# Patient Record
Sex: Female | Born: 1950 | Race: White | Hispanic: No | Marital: Married | Smoking: Never smoker
Health system: Southern US, Community
[De-identification: ages and names within clinical notes are randomized; demographics above are authoritative.]

## PROBLEM LIST (undated history)

## (undated) DIAGNOSIS — G894 Chronic pain syndrome: Secondary | ICD-10-CM

## (undated) DIAGNOSIS — M47812 Spondylosis without myelopathy or radiculopathy, cervical region: Secondary | ICD-10-CM

## (undated) DIAGNOSIS — M542 Cervicalgia: Secondary | ICD-10-CM

## (undated) DIAGNOSIS — K219 Gastro-esophageal reflux disease without esophagitis: Secondary | ICD-10-CM

## (undated) DIAGNOSIS — I10 Essential (primary) hypertension: Secondary | ICD-10-CM

## (undated) DIAGNOSIS — M961 Postlaminectomy syndrome, not elsewhere classified: Secondary | ICD-10-CM

## (undated) DIAGNOSIS — Z8489 Family history of other specified conditions: Secondary | ICD-10-CM

## (undated) DIAGNOSIS — M8580 Other specified disorders of bone density and structure, unspecified site: Secondary | ICD-10-CM

## (undated) DIAGNOSIS — R519 Headache, unspecified: Secondary | ICD-10-CM

## (undated) DIAGNOSIS — M1710 Unilateral primary osteoarthritis, unspecified knee: Secondary | ICD-10-CM

## (undated) DIAGNOSIS — E785 Hyperlipidemia, unspecified: Secondary | ICD-10-CM

## (undated) DIAGNOSIS — R198 Other specified symptoms and signs involving the digestive system and abdomen: Secondary | ICD-10-CM

## (undated) HISTORY — DX: Spondylosis without myelopathy or radiculopathy, cervical region: M47.812

## (undated) HISTORY — DX: Hyperlipidemia, unspecified: E78.5

## (undated) HISTORY — DX: Unilateral primary osteoarthritis, unspecified knee: M17.10

## (undated) HISTORY — DX: Chronic pain syndrome: G89.4

## (undated) HISTORY — DX: Cervicalgia: M54.2

## (undated) HISTORY — DX: Gastro-esophageal reflux disease without esophagitis: K21.9

## (undated) HISTORY — DX: Postlaminectomy syndrome, not elsewhere classified: M96.1

## (undated) HISTORY — PX: KNEE ARTHROSCOPY: SUR90

## (undated) HISTORY — DX: Essential (primary) hypertension: I10

## (undated) HISTORY — DX: Other specified disorders of bone density and structure, unspecified site: M85.80

## (undated) HISTORY — PX: COLOSTOMY REVERSAL: SHX5782

---

## 2003-02-25 ENCOUNTER — Ambulatory Visit (HOSPITAL_COMMUNITY): Admission: RE | Admit: 2003-02-25 | Discharge: 2003-02-25 | Payer: Self-pay | Admitting: Orthopedic Surgery

## 2004-08-22 ENCOUNTER — Encounter: Admission: RE | Admit: 2004-08-22 | Discharge: 2004-08-22 | Payer: Self-pay | Admitting: Internal Medicine

## 2004-12-01 ENCOUNTER — Encounter: Admission: RE | Admit: 2004-12-01 | Discharge: 2004-12-15 | Payer: Self-pay | Admitting: Neurosurgery

## 2004-12-25 HISTORY — PX: OTHER SURGICAL HISTORY: SHX169

## 2005-02-23 ENCOUNTER — Encounter: Admission: RE | Admit: 2005-02-23 | Discharge: 2005-05-24 | Payer: Self-pay | Admitting: Internal Medicine

## 2005-05-15 ENCOUNTER — Ambulatory Visit: Payer: Self-pay | Admitting: Internal Medicine

## 2005-07-04 ENCOUNTER — Ambulatory Visit (HOSPITAL_COMMUNITY): Admission: RE | Admit: 2005-07-04 | Discharge: 2005-07-04 | Payer: Self-pay | Admitting: Neurosurgery

## 2005-12-20 ENCOUNTER — Inpatient Hospital Stay (HOSPITAL_COMMUNITY): Admission: RE | Admit: 2005-12-20 | Discharge: 2005-12-21 | Payer: Self-pay | Admitting: Neurosurgery

## 2006-05-30 ENCOUNTER — Encounter: Admission: RE | Admit: 2006-05-30 | Discharge: 2006-06-29 | Payer: Self-pay | Admitting: Neurosurgery

## 2006-08-23 ENCOUNTER — Ambulatory Visit: Payer: Self-pay | Admitting: Internal Medicine

## 2007-05-06 ENCOUNTER — Encounter: Admission: RE | Admit: 2007-05-06 | Discharge: 2007-05-06 | Payer: Self-pay | Admitting: Neurosurgery

## 2007-05-23 ENCOUNTER — Encounter: Payer: Self-pay | Admitting: Internal Medicine

## 2007-07-29 ENCOUNTER — Encounter: Payer: Self-pay | Admitting: Internal Medicine

## 2007-08-06 ENCOUNTER — Encounter (INDEPENDENT_AMBULATORY_CARE_PROVIDER_SITE_OTHER): Payer: Self-pay | Admitting: *Deleted

## 2007-08-12 ENCOUNTER — Encounter
Admission: RE | Admit: 2007-08-12 | Discharge: 2007-09-10 | Payer: Self-pay | Admitting: Physical Medicine & Rehabilitation

## 2007-08-12 ENCOUNTER — Ambulatory Visit: Payer: Self-pay | Admitting: Physical Medicine & Rehabilitation

## 2007-09-02 ENCOUNTER — Ambulatory Visit: Payer: Self-pay | Admitting: Internal Medicine

## 2007-09-02 LAB — CONVERTED CEMR LAB
Cholesterol, target level: 200 mg/dL
LDL Goal: 160 mg/dL

## 2007-09-03 ENCOUNTER — Telehealth: Payer: Self-pay | Admitting: Internal Medicine

## 2007-09-10 ENCOUNTER — Encounter: Admission: RE | Admit: 2007-09-10 | Discharge: 2007-12-09 | Payer: Self-pay | Admitting: Anesthesiology

## 2007-10-08 ENCOUNTER — Ambulatory Visit: Payer: Self-pay | Admitting: Anesthesiology

## 2007-10-15 ENCOUNTER — Encounter: Payer: Self-pay | Admitting: Internal Medicine

## 2007-12-09 ENCOUNTER — Encounter: Admission: RE | Admit: 2007-12-09 | Discharge: 2007-12-10 | Payer: Self-pay | Admitting: Anesthesiology

## 2007-12-10 ENCOUNTER — Ambulatory Visit: Payer: Self-pay | Admitting: Anesthesiology

## 2008-01-07 ENCOUNTER — Encounter
Admission: RE | Admit: 2008-01-07 | Discharge: 2008-04-06 | Payer: Self-pay | Admitting: Physical Medicine & Rehabilitation

## 2008-01-07 ENCOUNTER — Ambulatory Visit: Payer: Self-pay | Admitting: Physical Medicine & Rehabilitation

## 2008-02-17 ENCOUNTER — Ambulatory Visit: Payer: Self-pay | Admitting: Physical Medicine & Rehabilitation

## 2008-03-13 ENCOUNTER — Encounter
Admission: RE | Admit: 2008-03-13 | Discharge: 2008-06-11 | Payer: Self-pay | Admitting: Physical Medicine & Rehabilitation

## 2008-05-04 ENCOUNTER — Encounter
Admission: RE | Admit: 2008-05-04 | Discharge: 2008-05-25 | Payer: Self-pay | Admitting: Physical Medicine & Rehabilitation

## 2008-05-04 ENCOUNTER — Ambulatory Visit: Payer: Self-pay | Admitting: Physical Medicine & Rehabilitation

## 2008-05-19 ENCOUNTER — Encounter: Payer: Self-pay | Admitting: Internal Medicine

## 2008-05-25 ENCOUNTER — Encounter
Admission: RE | Admit: 2008-05-25 | Discharge: 2008-08-23 | Payer: Self-pay | Admitting: Physical Medicine & Rehabilitation

## 2008-05-25 ENCOUNTER — Ambulatory Visit: Payer: Self-pay | Admitting: Physical Medicine & Rehabilitation

## 2008-07-07 ENCOUNTER — Ambulatory Visit: Payer: Self-pay | Admitting: Physical Medicine & Rehabilitation

## 2008-08-04 ENCOUNTER — Ambulatory Visit: Payer: Self-pay | Admitting: Physical Medicine & Rehabilitation

## 2008-09-04 ENCOUNTER — Encounter
Admission: RE | Admit: 2008-09-04 | Discharge: 2008-09-07 | Payer: Self-pay | Admitting: Physical Medicine & Rehabilitation

## 2008-09-07 ENCOUNTER — Ambulatory Visit: Payer: Self-pay | Admitting: Physical Medicine & Rehabilitation

## 2008-09-22 ENCOUNTER — Encounter: Payer: Self-pay | Admitting: Internal Medicine

## 2008-10-28 ENCOUNTER — Encounter
Admission: RE | Admit: 2008-10-28 | Discharge: 2009-01-26 | Payer: Self-pay | Admitting: Physical Medicine & Rehabilitation

## 2008-11-03 ENCOUNTER — Ambulatory Visit: Payer: Self-pay | Admitting: Physical Medicine & Rehabilitation

## 2008-11-17 ENCOUNTER — Encounter: Payer: Self-pay | Admitting: Internal Medicine

## 2008-12-29 ENCOUNTER — Ambulatory Visit: Payer: Self-pay | Admitting: Physical Medicine & Rehabilitation

## 2009-02-19 ENCOUNTER — Encounter
Admission: RE | Admit: 2009-02-19 | Discharge: 2009-05-20 | Payer: Self-pay | Admitting: Physical Medicine & Rehabilitation

## 2009-03-01 ENCOUNTER — Telehealth: Payer: Self-pay | Admitting: Internal Medicine

## 2009-03-01 ENCOUNTER — Ambulatory Visit: Payer: Self-pay | Admitting: Physical Medicine & Rehabilitation

## 2009-03-30 ENCOUNTER — Ambulatory Visit: Payer: Self-pay | Admitting: Physical Medicine & Rehabilitation

## 2009-04-19 ENCOUNTER — Ambulatory Visit: Payer: Self-pay | Admitting: Internal Medicine

## 2009-04-19 DIAGNOSIS — I10 Essential (primary) hypertension: Secondary | ICD-10-CM

## 2009-04-19 DIAGNOSIS — M5030 Other cervical disc degeneration, unspecified cervical region: Secondary | ICD-10-CM

## 2009-04-19 DIAGNOSIS — E785 Hyperlipidemia, unspecified: Secondary | ICD-10-CM | POA: Insufficient documentation

## 2009-04-21 LAB — CONVERTED CEMR LAB
ALT: 58 units/L — ABNORMAL HIGH (ref 0–35)
Alkaline Phosphatase: 53 units/L (ref 39–117)
BUN: 12 mg/dL (ref 6–23)
Bilirubin, Direct: 0 mg/dL (ref 0.0–0.3)
Calcium: 9.2 mg/dL (ref 8.4–10.5)
Creatinine, Ser: 0.6 mg/dL (ref 0.4–1.2)
Eosinophils Relative: 1.8 % (ref 0.0–5.0)
GFR calc non Af Amer: 108.78 mL/min (ref >60)
Lymphocytes Relative: 31.8 % (ref 12.0–46.0)
MCV: 94.2 fL (ref 78.0–100.0)
Monocytes Absolute: 0.3 10*3/uL (ref 0.1–1.0)
Monocytes Relative: 6.2 % (ref 3.0–12.0)
Neutrophils Relative %: 60.2 % (ref 43.0–77.0)
Platelets: 226 10*3/uL (ref 150.0–400.0)
Total Bilirubin: 0.8 mg/dL (ref 0.3–1.2)
WBC: 5.5 10*3/uL (ref 4.5–10.5)

## 2009-04-22 ENCOUNTER — Telehealth (INDEPENDENT_AMBULATORY_CARE_PROVIDER_SITE_OTHER): Payer: Self-pay | Admitting: *Deleted

## 2009-04-22 ENCOUNTER — Encounter (INDEPENDENT_AMBULATORY_CARE_PROVIDER_SITE_OTHER): Payer: Self-pay | Admitting: *Deleted

## 2009-04-23 ENCOUNTER — Telehealth: Payer: Self-pay | Admitting: Internal Medicine

## 2009-05-04 ENCOUNTER — Ambulatory Visit: Payer: Self-pay | Admitting: Physical Medicine & Rehabilitation

## 2009-05-10 ENCOUNTER — Encounter (INDEPENDENT_AMBULATORY_CARE_PROVIDER_SITE_OTHER): Payer: Self-pay | Admitting: *Deleted

## 2009-05-28 ENCOUNTER — Encounter
Admission: RE | Admit: 2009-05-28 | Discharge: 2009-07-27 | Payer: Self-pay | Admitting: Physical Medicine & Rehabilitation

## 2009-06-01 ENCOUNTER — Encounter: Payer: Self-pay | Admitting: Internal Medicine

## 2009-06-01 ENCOUNTER — Ambulatory Visit: Payer: Self-pay | Admitting: Physical Medicine & Rehabilitation

## 2009-07-27 ENCOUNTER — Ambulatory Visit: Payer: Self-pay | Admitting: Physical Medicine & Rehabilitation

## 2009-08-24 ENCOUNTER — Encounter
Admission: RE | Admit: 2009-08-24 | Discharge: 2009-11-22 | Payer: Self-pay | Admitting: Physical Medicine & Rehabilitation

## 2009-08-25 ENCOUNTER — Ambulatory Visit: Payer: Self-pay | Admitting: Physical Medicine & Rehabilitation

## 2009-09-27 ENCOUNTER — Ambulatory Visit: Payer: Self-pay | Admitting: Physical Medicine & Rehabilitation

## 2009-10-12 ENCOUNTER — Encounter: Payer: Self-pay | Admitting: Internal Medicine

## 2009-10-26 ENCOUNTER — Ambulatory Visit: Payer: Self-pay | Admitting: Physical Medicine & Rehabilitation

## 2009-11-16 ENCOUNTER — Encounter: Payer: Self-pay | Admitting: Internal Medicine

## 2009-11-22 ENCOUNTER — Encounter
Admission: RE | Admit: 2009-11-22 | Discharge: 2009-12-22 | Payer: Self-pay | Admitting: Physical Medicine & Rehabilitation

## 2009-11-23 ENCOUNTER — Ambulatory Visit: Payer: Self-pay | Admitting: Physical Medicine & Rehabilitation

## 2009-12-21 ENCOUNTER — Ambulatory Visit: Payer: Self-pay | Admitting: Physical Medicine & Rehabilitation

## 2009-12-25 HISTORY — PX: COLONOSCOPY: SHX174

## 2010-01-14 ENCOUNTER — Encounter
Admission: RE | Admit: 2010-01-14 | Discharge: 2010-04-11 | Payer: Self-pay | Admitting: Physical Medicine & Rehabilitation

## 2010-01-18 ENCOUNTER — Ambulatory Visit: Payer: Self-pay | Admitting: Physical Medicine & Rehabilitation

## 2010-03-15 ENCOUNTER — Ambulatory Visit: Payer: Self-pay | Admitting: Physical Medicine & Rehabilitation

## 2010-04-11 ENCOUNTER — Encounter
Admission: RE | Admit: 2010-04-11 | Discharge: 2010-07-10 | Payer: Self-pay | Admitting: Physical Medicine & Rehabilitation

## 2010-04-14 ENCOUNTER — Ambulatory Visit: Payer: Self-pay | Admitting: Physical Medicine & Rehabilitation

## 2010-04-19 ENCOUNTER — Ambulatory Visit: Payer: Self-pay | Admitting: Internal Medicine

## 2010-04-19 DIAGNOSIS — M542 Cervicalgia: Secondary | ICD-10-CM | POA: Insufficient documentation

## 2010-04-20 ENCOUNTER — Encounter (INDEPENDENT_AMBULATORY_CARE_PROVIDER_SITE_OTHER): Payer: Self-pay | Admitting: *Deleted

## 2010-05-19 ENCOUNTER — Encounter (INDEPENDENT_AMBULATORY_CARE_PROVIDER_SITE_OTHER): Payer: Self-pay | Admitting: *Deleted

## 2010-05-24 ENCOUNTER — Ambulatory Visit: Payer: Self-pay | Admitting: Gastroenterology

## 2010-05-24 ENCOUNTER — Ambulatory Visit: Payer: Self-pay | Admitting: Physical Medicine & Rehabilitation

## 2010-05-31 ENCOUNTER — Ambulatory Visit: Payer: Self-pay | Admitting: Gastroenterology

## 2010-06-14 ENCOUNTER — Encounter
Admission: RE | Admit: 2010-06-14 | Discharge: 2010-07-18 | Payer: Self-pay | Admitting: Physical Medicine & Rehabilitation

## 2010-06-23 ENCOUNTER — Ambulatory Visit: Payer: Self-pay | Admitting: Physical Medicine & Rehabilitation

## 2010-07-21 ENCOUNTER — Encounter
Admission: RE | Admit: 2010-07-21 | Discharge: 2010-10-19 | Payer: Self-pay | Admitting: Physical Medicine & Rehabilitation

## 2010-07-28 ENCOUNTER — Ambulatory Visit: Payer: Self-pay | Admitting: Physical Medicine & Rehabilitation

## 2010-09-20 ENCOUNTER — Ambulatory Visit: Payer: Self-pay | Admitting: Physical Medicine & Rehabilitation

## 2010-10-03 ENCOUNTER — Encounter: Payer: Self-pay | Admitting: Internal Medicine

## 2010-10-11 ENCOUNTER — Ambulatory Visit: Payer: Self-pay | Admitting: Internal Medicine

## 2010-10-11 DIAGNOSIS — M25569 Pain in unspecified knee: Secondary | ICD-10-CM

## 2010-10-11 DIAGNOSIS — I7300 Raynaud's syndrome without gangrene: Secondary | ICD-10-CM | POA: Insufficient documentation

## 2010-10-11 DIAGNOSIS — R740 Nonspecific elevation of levels of transaminase and lactic acid dehydrogenase [LDH]: Secondary | ICD-10-CM

## 2010-10-11 DIAGNOSIS — R7401 Elevation of levels of liver transaminase levels: Secondary | ICD-10-CM | POA: Insufficient documentation

## 2010-10-11 DIAGNOSIS — M25469 Effusion, unspecified knee: Secondary | ICD-10-CM | POA: Insufficient documentation

## 2010-10-13 ENCOUNTER — Telehealth: Payer: Self-pay | Admitting: Internal Medicine

## 2010-10-17 LAB — CONVERTED CEMR LAB
ALT: 26 units/L (ref 0–35)
AST: 29 units/L (ref 0–37)
Total Bilirubin: 0.4 mg/dL (ref 0.3–1.2)

## 2010-11-07 ENCOUNTER — Encounter
Admission: RE | Admit: 2010-11-07 | Discharge: 2010-11-10 | Payer: Self-pay | Source: Home / Self Care | Attending: Physical Medicine & Rehabilitation | Admitting: Physical Medicine & Rehabilitation

## 2010-11-08 ENCOUNTER — Encounter: Admission: RE | Admit: 2010-11-08 | Discharge: 2010-11-08 | Payer: Self-pay | Admitting: Sports Medicine

## 2010-11-10 ENCOUNTER — Ambulatory Visit: Payer: Self-pay | Admitting: Physical Medicine & Rehabilitation

## 2011-01-05 ENCOUNTER — Encounter
Admission: RE | Admit: 2011-01-05 | Discharge: 2011-01-09 | Payer: Self-pay | Source: Home / Self Care | Attending: Physical Medicine & Rehabilitation | Admitting: Physical Medicine & Rehabilitation

## 2011-01-09 ENCOUNTER — Ambulatory Visit
Admission: RE | Admit: 2011-01-09 | Discharge: 2011-01-09 | Payer: Self-pay | Source: Home / Self Care | Attending: Physical Medicine & Rehabilitation | Admitting: Physical Medicine & Rehabilitation

## 2011-01-24 ENCOUNTER — Telehealth (INDEPENDENT_AMBULATORY_CARE_PROVIDER_SITE_OTHER): Payer: Self-pay | Admitting: *Deleted

## 2011-01-24 NOTE — Assessment & Plan Note (Signed)
Summary: talk about pain mangement of neck//lch   Vital Signs:  Patient profile:   60 year old female Weight:      137.8 pounds BMI:     24.89 Temp:     99.2 degrees F oral Pulse rate:   72 / minute Resp:     15 per minute BP sitting:   128 / 84  (left arm) Cuff size:   regular  Vitals Entered By: Shonna Chock (April 19, 2010 1:40 PM) CC: Discuss Neck pain Comments REVIEWED MED LIST, PATIENT AGREED DOSE AND INSTRUCTION CORRECT    CC:  Discuss Neck pain.  History of Present Illness: She is seeing Dr Andee Lineman  or his nurse monthly for med refills for intractable pain following  cervical fusion in 2006.The frequent visits are a financial hardship.Meds reviewed ; now off Gabapentin & Carisoprolol because of suboptimal response.  The Opana long acting & short acting are stable Rxs. Physical Therapy prescribed but declined as it had previously been ineffective.She queries having me refill pain meds , but [place] [facility] restrictions discussed.  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 LDL goal = < 130, ideally < 100. Hypertension  Past Surgical History: Cervical fusion 2006 @ [facility], Dr Lovell Sheehan; Arthroscopy knee 2004; G 1 P1  Review of Systems General:  Denies fatigue and sleep disorder. ENT:  Denies difficulty swallowing and hoarseness. CV:  Denies palpitations. GI:  Complains of constipation; denies diarrhea. Derm:  Denies changes in nail beds, dryness, and hair loss. Neuro:  Denies brief paralysis, numbness, tingling, and weakness. Psych:  Denies anxiety, depression, easily angered, easily tearful, and irritability.  Physical Exam  General:  well-nourished,in no acute distress; alert,appropriate and cooperative throughout examination Eyes:  No corneal or conjunctival inflammation  noted.Marland Kitchen Perrla.No lid lag Neck:  No deformities, masses, or tenderness noted. R lobe larger than L w/o nodules Heart:  Normal rate and regular rhythm. S1 and S2 normal without gallop, murmur, click, rub .S4 Neurologic:  alert & oriented X3, strength normal in all extremities, and DTRs symmetrical and normal.   Skin:  Intact without suspicious lesions or rashes Psych:  memory intact for recent and remote, normally interactive, good eye contact, not anxious appearing, and not depressed appearing.     Impression & Recommendations:  Problem # 1:  NECK PAIN, CHRONIC (ICD-723.1)  The following medications were removed from the medication list:    Carisoprodol 350 Mg Tabs (Carisoprodol) .Marland Kitchen... 1-2 by mouth once daily Her updated medication list for this problem includes:    Opana Er 30 Mg Xr12h-tab (Oxymorphone hcl) .Marland Kitchen... 1 by mouth every 12 hours    Opana 10 Mg Tabs (Oxymorphone hcl) .Marland Kitchen... 1 by mouth every 6 hours as needed    Amrix 15 Mg Xr24h-cap (Cyclobenzaprine hcl) .Marland Kitchen... 1 by mouth at bedtime  Problem # 2:  HYPERTENSION (ICD-401.9) controlled Her updated medication list for this problem includes:    Metoprolol Tartrate 50 Mg Tabs (Metoprolol tartrate) .Marland Kitchen... Take 1/2- 1 tab once daily to keep bp <  130/85 on average  Problem # 3:  HYPERLIPIDEMIA (ICD-272.4) LDL goal = <130.  Complete Medication List: 1)  Combipatch 0.05-0.25 Mg/day Pttw (Estradiol-norethindrone acet) 2)  Metoprolol Tartrate 50 Mg Tabs (Metoprolol tartrate) .... Take 1/2- 1 tab once daily to keep bp <  130/85 on average 3)  Opana Er 30  Mg Xr12h-tab (Oxymorphone hcl) .Marland Kitchen.. 1 by mouth every 12 hours 4)  Opana 10 Mg Tabs (Oxymorphone hcl) .Marland Kitchen.. 1 by mouth every 6 hours as needed 5)  Amrix 15 Mg Xr24h-cap (Cyclobenzaprine hcl) .Marland Kitchen.. 1 by mouth at bedtime 6)  Lidoderm 5 % Ptch (Lidocaine) .... 2 patches daily 7)  Voltaren 1 % Gel (Diclofenac sodium) .... As directed  Patient Instructions: 1)  Discuss the frequency of office  visits with Dr Hermelinda Medicus; the restrictions should  be less on him as he is a specialist.Schedule fasting labs: 2)  BMP ; 3)  Hepatic Panel ; 4)  Lipid Panel; 5)  TSH . Prescriptions: METOPROLOL TARTRATE 50 MG TABS (METOPROLOL TARTRATE) take 1/2- 1 tab once daily to keep BP <  130/85 on average  #90 x 3   Entered and Authorized by:   Marga Melnick MD   Signed by:   Marga Melnick MD on 04/19/2010   Method used:   Faxed to ...       [facility]* (retail)       [address]       [place], [place]  [address]       Ph: [phone]       Fax: [phone]   RxID:   [id number]

## 2011-01-24 NOTE — Progress Notes (Signed)
Summary: change referral  Phone Note Call from Patient   Summary of Call: Patient left message on triage that MD referred her to ortho surgeron and she has seen someone before. Patient is requesting an appt sooner or to change the referral. Please call her at [phone] Initial call taken by: Lucious Groves CMA,  October 13, 2010 12:09 PM  Follow-up for Phone Call        I LEFT MESSAGE FOR PT TO CALL ME ABOUT ORTHO REFERRAL, & WILL TRY TO GET HER SOONER APPT W/[facility]. Follow-up by: Magdalen [facility],  October 14, 2010 4:59 PM  Additional Follow-up for Phone Call Additional follow up Details #1::        I S/W PT, SHE WILL NOW BE SEEN AT [facility] ON 10-18-2010 & PATIENT IS SATISFIED. Magdalen Spatz [facility]  October 17, 2010 3:12 PM

## 2011-01-24 NOTE — Procedures (Signed)
Summary: Colonoscopy  Patient: Chloe Young Note: All result statuses are Final unless otherwise noted.  Tests: (1) Colonoscopy (COL)   COL Colonoscopy           DONE     [facility]     [address]     [place], [place]  [address]           COLONOSCOPY PROCEDURE REPORT           PATIENT:  Chloe Young, Chloe Young  MR#:  [id number]     BIRTHDATE:  05-21-1951, 59 yrs. old  GENDER:  female     ENDOSCOPIST:  Judie Petit T. Russella Dar, MD, [facility]     Referred by:  Marga Melnick, M.D.     PROCEDURE DATE:  05/31/2010     PROCEDURE:  Colonoscopy 19[address]     ASA CLASS:  Class II     INDICATIONS:  1) Routine Risk Screening     MEDICATIONS:   Fentanyl 50 mcg IV, Versed 7 mg IV     DESCRIPTION OF PROCEDURE:   After the risks benefits and     alternatives of the procedure were thoroughly explained, informed     consent was obtained.  Digital rectal exam was performed and     revealed no abnormalities.   The LB [id number]-[id number] endoscope     was introduced through the anus and advanced to the cecum, which     was identified by both the appendix and ileocecal valve, without     limitations.  The quality of the prep was excellent, using     MoviPrep.  The instrument was then slowly withdrawn as the colon     was fully examined.     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 A normal     appearing cecum, ileocecal valve, and appendiceal orifice were     identified. The ascending, hepatic flexure, transverse, splenic     flexure, descending, sigmoid colon, and rectum appeared     unremarkable. Retroflexed views in the rectum revealed no     abnormalities. The time to cecum =  4.5  minutes. The scope was     then withdrawn (time =  13.67  min) from the patient and the     procedure completed.           COMPLICATIONS:  None           ENDOSCOPIC IMPRESSION:     1) Melanosis throughout the colon     2) Normal colon     RECOMMENDATIONS:     1) Continue current  colorectal screening recommendations for     "routine risk" patients with a repeat colonoscopy in 10 years.           Venita Lick. Russella Dar, MD, [facility]           n.     eSIGNED:   Venita Lick. Ancel Easler at 05/31/2010 11:58 AM           Chloe Young, [id number]  Note: An exclamation mark (!) indicates a result that was not dispersed into the flowsheet. Document Creation Date: 05/31/2010 11:59 AM _______________________________________________________________________  (1) Order result status: Final Collection or observation date-time: 05/31/2010 11:53 Requested date-time:  Receipt date-time:  Reported date-time:  Referring Physician:   Ordering Physician: Claudette Head [phone]) Specimen Source:  Source: Launa Grill Order Number: [id number] Lab site:   Appended Document: Colonoscopy    Clinical Lists Changes  Observations: Added new observation of COLONNXTDUE: 05/2020 (05/31/2010 13:11)

## 2011-01-24 NOTE — Miscellaneous (Signed)
Summary: Orders Update  Clinical Lists Changes  Problems: Added new problem of SCREENING, COLON CANCER (ICD-V76.51) Orders: Added new Referral order of Gastroenterology Referral (GI) - Signed

## 2011-01-24 NOTE — Assessment & Plan Note (Signed)
Summary: pain in left knee//lch   Vital Signs:  Patient profile:   60 year old female Weight:      141.6 pounds BMI:     25.58 Temp:     98.6 degrees F oral Pulse rate:   64 / minute Resp:     15 per minute BP sitting:   114 / 78  (left arm) Cuff size:   large  Vitals Entered By: Shonna Chock CMA (October 11, 2010 10:03 AM) CC: Pain in left knee x 1 month, Lower Extremity Joint pain   CC:  Pain in left knee x 1 month and Lower Extremity Joint pain.  History of Present Illness: Lower Extremity Pain      This is a 60 year old woman who presents with Lower Extremity pain X 1 month.  The patient reports swelling, stiffness for >1 hr, and decreased ROM, but denies redness, giving away, locking, popping, and weakness.  The pain is located in the left knee in the poplteal space. It radiates into the L calf & L buttocks. The pain began gradually and with no injury.  The pain is described as burning, aching, and constant.  No evaluation to date ;S/P arthroscopic repair of torn meniscus  in R knee 2001. 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  The patient denies the following symptoms: fever, rash, photosensitivity, eye symptoms, diarrhea, and dysuria.  Rx: Meloxicam < 1 / day on average (due to PMH of elevated LFTs in 03/2009: no F/U to date)  with minimal benefit from Dr Riley Kill , Pain Specialist.  Current Medications (verified): 1)  Combipatch 0.05-0.25 Mg/day  Pttw (Estradiol-Norethindrone Acet) 2)  Metoprolol Tartrate 50 Mg Tabs (Metoprolol Tartrate) .... Take 1/2- 1 Tab Once Daily To Keep Bp <  130/85 On Average 3)  Opana Er 30 Mg Xr12h-Tab (Oxymorphone Hcl) .Marland Kitchen.. 1 By Mouth Every 12 Hours 4)  Opana 10 Mg Tabs (Oxymorphone Hcl) .Marland Kitchen.. 1 By Mouth Every 6 Hours As Needed 5)  Amrix 15 Mg Xr24h-Cap (Cyclobenzaprine Hcl) .Marland Kitchen.. 1 By Mouth At Bedtime 6)  Lidoderm 5 % Ptch (Lidocaine) .... 2 Patches Daily 7)  Voltaren 1 % Gel (Diclofenac Sodium) .... As Directed 8)  Omeprazole 20  Mg Tbec (Omeprazole) .Marland Kitchen.. 1 By Mouth Once Daily  Allergies (verified): No Known Drug Allergies  Physical Exam  General:  in no acute distress; alert,appropriate and cooperative throughout examination Msk:  No deformity or scoliosis noted of thoracic or lumbar spine.  No tendernes to percussion. Lay down & sat w/o help. Neg SLR bilaterally   Knee Exam  Gait:    Flat footed  gait pattern bilaterally with slight ankle inversion.    Skin:    Intact, no scars, lesions, rashes, cafe au lait spots, or bruising.  Lacy venous pattern over legs  Inspection:    Fusiformly enlarged knees . Varus deformity. Effusion L knee. Decrecreased ROM bilaterally.   Palpation:    Non-tender to palpation over ant knee or in popliteal space. Neg Homan's  Vascular:    There was no swelling or varicose veins. The pedal pulses are decreased ; femoral pulses normal w/o bruits. Feet &  hands are cool w/o ischemia or color change. There was no edema or tenderness.  Motor:    Motor strength 5/5 bilaterally for LE but ? slight atrophy of R thigh muscles  Reflexes:    decreased R-patellar and decreased L-patellar.     Impression & Recommendations:  Problem #  1:  KNEE PAIN, LEFT (ICD-719.46)  R/O Popliteal Cyst  Her updated medication list for this problem includes:    Opana Er 30 Mg Xr12h-tab (Oxymorphone hcl) .Marland Kitchen... 1 by mouth every 12 hours    Opana 10 Mg Tabs (Oxymorphone hcl) .Marland Kitchen... 1 by mouth every 6 hours as needed    Amrix 15 Mg Xr24h-cap (Cyclobenzaprine hcl) .Marland Kitchen... 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 Take 1/2- 1 tab once daily to keep bp <  130/85 on average 3)  Opana Er 30 Mg Xr12h-tab (Oxymorphone hcl) .Marland Kitchen.. 1 by mouth every 12 hours 4)  Opana 10 Mg Tabs (Oxymorphone hcl) .Marland Kitchen.. 1 by mouth every 6 hours as needed 5)  Amrix 15 Mg Xr24h-cap (Cyclobenzaprine hcl) .Marland Kitchen.. 1 by mouth at bedtime 6)  Lidoderm 5 % Ptch (Lidocaine) .... 2 patches daily 7)  Voltaren 1 % Gel (Diclofenac sodium) .... As directed 8)  Omeprazole 20 Mg Tbec (Omeprazole) .Marland Kitchen.. 1 by mouth once daily  Patient Instructions: 1)  Meloxicam 15 mg 1/2 two times a day X 7 days as trial to assess response.   Orders Added: 1)  Est. Patient Level IV [[address]] 2)  Orthopedic Referral [Ortho] 3)  Venipuncture [[address]] 4)  TLB-Hepatic/Liver Function Pnl [80076-HEPATIC]

## 2011-01-24 NOTE — Letter (Signed)
Summary: Moviprep Instructions  [facility]  [address]   [place], [place] [address]   Phone: [phone]  Fax: [phone]       Chloe Young    August 05, 1951    MRN: [id number]        Procedure Day Dorna Bloom: Tuesday, 05-31-10     Arrival Time: 10:00 a.m.     Procedure Time: 11:00 a.m.     Location of Procedure:                    x   [facility] (4th Floor)   PREPARATION FOR COLONOSCOPY WITH MOVIPREP   Starting 5 days prior to your procedure 05-26-10 do not eat nuts, seeds, popcorn, corn, beans, peas,  salads, or any raw vegetables.  Do not take any fiber supplements (e.g. Metamucil, Citrucel, and Benefiber).  THE DAY BEFORE YOUR PROCEDURE         DATE:  05-30-10  DAY: Monday 1.  Drink clear liquids the entire day-NO SOLID FOOD  2.  Do not drink anything colored red or purple.  Avoid juices with pulp.  No orange juice.  3.  Drink at least 64 oz. (8 glasses) of fluid/clear liquids during the day to prevent dehydration and help the prep work efficiently.  CLEAR LIQUIDS INCLUDE: Water Jello Ice Popsicles Tea (sugar ok, no milk/cream) Powdered fruit flavored drinks Coffee (sugar ok, no milk/cream) Gatorade Juice: apple, white grape, white cranberry  Lemonade Clear bullion, consomm, broth Carbonated beverages (any kind) Strained chicken noodle soup Hard Candy                             4.  In the morning, mix first dose of MoviPrep solution:    Empty 1 Pouch A and 1 Pouch B into the disposable container    Add lukewarm drinking water to the top line of the container. Mix to dissolve    Refrigerate (mixed solution should be used within 24 hrs)  5.  Begin drinking the prep at 5:00 p.m. The MoviPrep container is divided by 4 marks.   Every 15 minutes drink the solution down to the next mark (approximately 8 oz) until the full liter is complete.   6.  Follow completed prep with 16 oz of clear liquid of your choice (Nothing red or purple).   Continue to drink clear liquids until bedtime.  7.  Before going to bed, mix second dose of MoviPrep solution:    Empty 1 Pouch A and 1 Pouch B into the disposable container    Add lukewarm drinking water to the top line of the container. Mix to dissolve    Refrigerate  THE DAY OF YOUR PROCEDURE      DATE:  05-31-10  DAY: Tuesday  Beginning at  6:00 a.m. (5 hours before procedure):         1. Every 15 minutes, drink the solution down to the next mark (approx 8 oz) until the full liter is complete.  2. Follow completed prep with 16 oz. of clear liquid of your choice.    3. You may drink clear liquids until 9:00 a.m.  (2 HOURS BEFORE PROCEDURE).   MEDICATION INSTRUCTIONS  Unless otherwise instructed, you should take regular prescription medications with a small sip of water   as early as possible the morning of your procedure.            OTHER INSTRUCTIONS  You  will need a responsible adult at least 61 years of age to accompany you and drive you home.   This person must remain in the waiting room during your procedure.  Wear loose fitting clothing that is easily removed.  Leave jewelry and other valuables at home.  However, you may wish to bring a book to read or  an iPod/MP3 player to listen to music as you wait for your procedure to start.  Remove all body piercing jewelry and leave at home.  Total time from sign-in until discharge is approximately 2-3 hours.  You should go home directly after your procedure and rest.  You can resume normal activities the  day after your procedure.  The day of your procedure you should not:   Drive   Make legal decisions   Operate machinery   Drink alcohol   Return to work  You will receive specific instructions about eating, activities and medications before you leave.    The above instructions have been reviewed and explained to me by  Ezra Sites RN  May 24, 2010 11:19 AM    I fully understand and can verbalize  these instructions _____________________________ Date _________

## 2011-01-24 NOTE — Miscellaneous (Signed)
Summary: Flu/[facility]  Flu/[facility]   Imported By: Lanelle Bal 10/11/2010 13:07:39  _____________________________________________________________________  External Attachment:    Type:   Image     Comment:   External Document

## 2011-01-24 NOTE — Miscellaneous (Signed)
Summary: [facility]  Clinical Lists Changes  Medications: Added new medication of MOVIPREP 100 GM  SOLR (PEG-KCL-NACL-NASULF-NA ASC-C) As per prep instructions. - Signed Rx of MOVIPREP 100 GM  SOLR (PEG-KCL-NACL-NASULF-NA ASC-C) As per prep instructions.;  #1 x 0;  Signed;  Entered by: Ezra Sites RN;  Authorized by: Meryl Dare MD Clementeen Graham;  Method used: Electronically to [facility]*, [address], [place], [place]  [id number], Ph: [phone], Fax: [phone] Observations: Added new observation of NKA: T (05/24/2010 10:53)    Prescriptions: MOVIPREP 100 GM  SOLR (PEG-KCL-NACL-NASULF-NA ASC-C) As per prep instructions.  #1 x 0   Entered by:   Ezra Sites RN   Authorized by:   Meryl Dare MD [facility]   Signed by:   Ezra Sites RN on 05/24/2010   Method used:   Electronically to        [facility]* (retail)       [address]       [place], [place]  [address]       Ph: [phone]       Fax: [phone]   RxID:   [id number]

## 2011-01-24 NOTE — Letter (Signed)
Summary: Primary Care Consult Scheduled Letter  [facility]  [address] [place], [place] [address]   Phone: [phone]  Fax: [phone]      04/20/2010 MRN: [id number]  [facility] [address] [place], [place]  [address]    Dear [name],    We have scheduled an appointment for you.  At the recommendation of Dr. Marga Melnick, we have scheduled you for a Screening Colonoscopy with Dr. Claudette Head of [facility].  Your initial Pre-visit with a Nurse is on 05-05-2010 at 9:00am.  Your Colonoscopy is on 05-19-2010 arrive no later than 1:00pm.  Their address is [address], [place] [address]. The office phone number is [phone].  If this appointment day and time is not convenient for you, please feel free to call the office of the doctor you are being referred to at the number listed above and reschedule the appointment.    It is important for you to keep your scheduled appointments. We are here to make sure you are given good patient care.   Thank you,    [name], Patient Care Coordinator [facility]

## 2011-02-01 NOTE — Progress Notes (Signed)
Summary: Refill Request  Phone Note Refill Request Call back at [phone] Message from:  Pharmacy on January 24, 2011 11:41 AM  Refills Requested: Medication #1:  METOPROLOL TARTRATE 50 MG TABS take 1/2- 1 tab once daily to keep BP <  130/85 on average   Dosage confirmed as above?Dosage Confirmed   Supply Requested: 30   Last Refilled: 01/07/2011 [facility]  Next Appointment Scheduled: none Initial call taken by: Harold Barban,  January 24, 2011 11:41 AM    Prescriptions: METOPROLOL TARTRATE 50 MG TABS (METOPROLOL TARTRATE) take 1/2- 1 tab once daily to keep BP <  130/85 on average  #30 Each x 5   Entered by:   Shonna Chock CMA   Authorized by:   Marga Melnick MD   Signed by:   Shonna Chock CMA on 01/24/2011   Method used:   Electronically to        [facility]* (retail)       [address]       [place], [place]  [address]       Ph: [phone]       Fax: [phone]   RxID:   [id number]

## 2011-03-09 ENCOUNTER — Ambulatory Visit: Payer: Self-pay

## 2011-03-30 ENCOUNTER — Encounter: Payer: 59 | Attending: Physical Medicine & Rehabilitation

## 2011-03-30 DIAGNOSIS — M542 Cervicalgia: Secondary | ICD-10-CM | POA: Insufficient documentation

## 2011-03-30 DIAGNOSIS — M961 Postlaminectomy syndrome, not elsewhere classified: Secondary | ICD-10-CM

## 2011-03-30 DIAGNOSIS — M25519 Pain in unspecified shoulder: Secondary | ICD-10-CM | POA: Insufficient documentation

## 2011-03-30 DIAGNOSIS — M5382 Other specified dorsopathies, cervical region: Secondary | ICD-10-CM

## 2011-03-30 DIAGNOSIS — M531 Cervicobrachial syndrome: Secondary | ICD-10-CM

## 2011-03-30 DIAGNOSIS — R51 Headache: Secondary | ICD-10-CM | POA: Insufficient documentation

## 2011-05-03 LAB — BASIC METABOLIC PANEL
BUN: 14 mg/dL (ref 6–23)
Creatinine, Ser: 0.62 mg/dL (ref 0.4–1.2)
GFR calc non Af Amer: >60 mL/min (ref >60)
Potassium: 4.2 mEq/L (ref 3.5–5.1)

## 2011-05-03 LAB — CBC
MCH: 31.8 pg (ref 26.0–34.0)
MCV: 93.9 fL (ref 78.0–100.0)
Platelets: 222 10*3/uL (ref 150–400)
RDW: 12.6 % (ref 11.5–15.5)
WBC: 5.4 10*3/uL (ref 4.0–10.5)

## 2011-05-08 ENCOUNTER — Ambulatory Visit (HOSPITAL_BASED_OUTPATIENT_CLINIC_OR_DEPARTMENT_OTHER)
Admission: RE | Admit: 2011-05-08 | Discharge: 2011-05-08 | Disposition: A | Discharge status: Home / Self Care | Payer: BC Managed Care – PPO | Source: Ambulatory Visit | Attending: Specialist | Admitting: Specialist

## 2011-05-08 DIAGNOSIS — M1710 Unilateral primary osteoarthritis, unspecified knee: Secondary | ICD-10-CM | POA: Insufficient documentation

## 2011-05-08 DIAGNOSIS — I10 Essential (primary) hypertension: Secondary | ICD-10-CM | POA: Insufficient documentation

## 2011-05-08 DIAGNOSIS — K219 Gastro-esophageal reflux disease without esophagitis: Secondary | ICD-10-CM | POA: Insufficient documentation

## 2011-05-08 DIAGNOSIS — M23305 Other meniscus derangements, unspecified medial meniscus, unspecified knee: Secondary | ICD-10-CM | POA: Insufficient documentation

## 2011-05-08 DIAGNOSIS — M23302 Other meniscus derangements, unspecified lateral meniscus, unspecified knee: Secondary | ICD-10-CM | POA: Insufficient documentation

## 2011-05-09 NOTE — Procedures (Signed)
NAME:  Chloe Young, Chloe Young                 ACCOUNT NO.:  [id number]   MEDICAL RECORD NO.:  [id number]          PATIENT TYPE:  REC   LOCATION:  [facility]                          FACILITY:  [facility]   PHYSICIAN:  Ranelle Oyster, M.D.DATE OF BIRTH:  [birth year]   DATE OF PROCEDURE:  DATE OF DISCHARGE:                               OPERATIVE REPORT   PROCEDURE:  Trigger point injections, diagnostic code 723.9.   DESCRIPTION OF PROCEDURE AND INDICATIONS:  The patient had followup with  Dr. Lovell Sheehan.  He found no new surgical reasons for her cervical pain.  She had had previously good responses with trigger point injections in  the past and wanted to proceed these once again, as her oral medications  were not covering her symptoms.   After informed consent and preparation of the skin with isopropyl  alcohol, we injected five areas today, including the upper and medial  third of the left trapezius, the left sternocleidomastoid and the left  supraspinatus muscle each with 0.5 to 2 mL of 1% lidocaine.  Additionally, we injected the right upper trapezius with the same  solution.  The patient tolerated it well.  We asked her to work on  posture and positioning after the injections.   I will send her for nerve stimulator/muscle stimulator unit through  [facility], as I think she could benefit from this.  We also gave her  Biofreeze samples to try.   I will see her back in about 6 weeks' time, as refills for her  medications were also dispensed today.      Ranelle Oyster, M.D.  Electronically Signed     ZTS/MEDQ  D:  05/25/2008 15:04:16  T:  05/25/2008 16:50:20  Job:  [id number]

## 2011-05-09 NOTE — Assessment & Plan Note (Signed)
HISTORY OF PRESENT ILLNESS:  Chloe Young is back regarding her cervicalgia.  She had good results with the left sternocleidomastoid muscle trigger  point injections in January. She is having more central and superior  pain along the neck today. She rates it a 5 to 8 out of 10. The Opana  immediate release has been effective somewhat, although she is feeling  that it not quite as effective as the Oxycodone 15 mg. She finds that 10  mg of Opana is too sedating for her. We increased her low dose Neurontin  to 300 mg t.i.d. and notes no significant change in neck symptoms. She  has been on Mobic for anti-inflammatory effects in the interim. 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 She has better back and arm symptoms  at this point. Sleep is fair.   REVIEW OF SYSTEMS:  Notable for the above. Full review is in the written  health and history section of the chart.   SOCIAL HISTORY:  As noted above. 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   PHYSICAL EXAMINATION:  VITAL SIGNS:  Blood pressure 173/96, pulse 76,  respiratory rate 18. She is sating 100% on room air.  GENERAL:  The patient is pleasant. Alert and oriented times three.  NEUROLOGIC:  Affect is bright and appropriate. She had some pain over  the lower trapezius, although much improved. She has significant pain,  however, on the upper trapezius at C2-C3, as well as the splenius  capitus muscles in the corresponding areas. The pain was notable with  flexion and more so, extension today. No motor findings were found in  the upper extremities or sensory loss. Sperling's test was negative.  Strength 5/5 in the upper extremities. Reflexes are normal.   ASSESSMENT:  1. History of cervical decompression C4 to C5, and C5 to C6.  2. Cervical facet arthropathy, partially responsive to radiofrequency      ablation.  3. Cervical myofascial pain and postural dysfunction.  4. Headaches.   PLAN:  1. Continue Neurontin 300 mg t.i.d.  2. After informed consent, we injected bilateral splenius capitus and      trapezius muscles with 4 trigger points total, each consisting of 2      cc of 1% Lidocaine. The patient tolerated well.  3. Continue Opana for breakthrough pain for now.  4. Consider home gentle traction.  5. Stretching breast breaks and proper posture at work.  6. Add low-dose Zanaflex for spasm, 2 mg capsule 1 q.8 hours p.r.n.      She was given samples today.  7. I will see her back in about a month.      Ranelle Oyster, M.D.  Electronically Signed    ZTS/MedQ  D:  02/18/2008 13:01:40  T:  02/18/2008 18:09:09  Job #:  [id number]   cc:   Cristi Loron, M.D.  Fax: [phone]

## 2011-05-09 NOTE — Assessment & Plan Note (Signed)
Chloe Young is in back regarding her chronic neck pain.  She has been fairly  stable except for some occasional increases in her breakthrough symptoms  associated when she is up for long periods of time at work.  She uses 2  Opana IR in those cases and this seems to help.  She still only using 1  or 2 a day p.r.n. at her baseline.  She rates her pain 5-6/10.  She has  liked the Amrix at 15 mg p.o. at bedtime for baseline spasm control and  generalized pain.  She describes pain as burning, aching, and constant.  Her sleep is good at this point.  She has to keep up with her bowels and  stay ahead of constipation, but overall is stable there.  Opana ER is 20  mg q.12 h and Opana IR is 10 mg p.r.n.   REVIEW OF SYSTEMS:  Notable for the above.  Full review is in the  written health and history section of the chart.  The patient's Oswestry  score is 20% today.   SOCIAL HISTORY:  As noted above.  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   PHYSICAL EXAMINATION:  VITAL SIGNS:  Blood pressure is 139/93, pulse is  76, respiratory rate is 18.  She is sating 100% on room air.  GENERAL:  The patient is pleasant, alert and oriented x3.  Affect is  generally bright and appropriate.  NEUROLOGIC:  She continues to have some pain with flexion and extension  of the neck and is limited overall.  Strength is 5/5 in all 4  extremities with 2+ reflexes in all extremities.  Posture is fair to  good.  Cognitively, she is alert and appropriate with normal cranial  nerve exam today as well.  HEART:  Regular.  CHEST:  Clear.  ABDOMEN:  Soft, nontender.   ASSESSMENT:  1. Cervical post laminectomy syndrome.  2. Cervical facet arthropathy.  3. Cervical myofascial pain and postural dysfunction.  4. Headache.  5. Right shoulder pain which is improved especially with Amrix.   PLAN:  1. We will make maintain Opana ER 20 mg q.12 h #60.  2. Refilled Opana IR 10 mg 1 q.12 h p.r.n. #75.  She still may use an    additional 1 if she needs to on days pain is worse.  3. Continue Amrix 15 mg at bedtime.  This seems to work well for her.      She may want to try to move up to 30 mg to assess for effect.  4. Continue Neurontin.  5. Continue other modalities and rest breaks.  She can use Mobic      p.r.n. for increased pain as well.  6. I will see her back in 3 months with 18-month nursing followup.      Ranelle Oyster, M.D.  Electronically Signed    ZTS/MedQ  D:  03/01/2009 12:27:56  T:  03/02/2009 01:04:05  Job #:  [id number]   cc:   Cristi Loron, M.D.  Fax: [phone]

## 2011-05-09 NOTE — Assessment & Plan Note (Signed)
HISTORY:  Recurrent history of cervicalgia.  She has fair control with  the Opana, but is needing the Opana usually 3-4 times a day.  She rates  her pain at 4-5 out of 10.  She said her pain was exacerbated by a  recent flight from [place] on Saturday.  She is having shoulder pain  on the right side when she works.  She complains of some swelling in the  legs today, and asked if it is due to the Opana.  She also reports some  stabbing, aching and burning.  Sleep is fairly good.   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  She does have some  occasional constipation and urine retention.  The full review of systems  is written up in the history section.   SOCIAL HISTORY:  As noted above.  The patient is working as a  Interior and spatial designer, and is cutting back her hours a week; but still is at 30+  currently.   PHYSICAL EXAMINATION:  Blood pressure 172/72, pulse 61, respiratory rate  18.  She is satting 98% on room air.  The patient is pleasant, alert and  oriented x3.  Affect is appropriate.  She continues to have some pain  diffusely over the trapezius muscles and upper cervical regions.  She  had a fair range of motion today, but limited somewhat with rotation and  lateral bending to either side.  She had pain in the right shoulder with  rotator cuff impingement maneuvers today, and there was focal shoulder  weakness appreciated.  Spurling's test was negative.  Strength was 5 out  of 5 in both upper extremities, with normal sensory examination and  reflexes today.  Posture was fair. She was alert, appropriate and  cognitively intact.  HEART:  Regular.  CHEST:  Clear.  ABDOMEN:  Soft and nontender.   ASSESSMENT:  1. History of cervical post laminectomy syndrome.  2. Cervical facet arthropathy.  3. Cervical myofascial pain and postural dysfunction.  4. Headaches.  5. Right shoulder pain, consistent with rotator cuff      tendonitis/bursitis.   PLAN:  1. Add Opana extended release,  10 mg q. 12 h. in addition to her      immediate release; with the goal of getting her immediate release      down to 1 or 2 times a day.  2. Will back off Mobic and use p.r.n. only, as this is likely the      cause of swelling.  Neurontin may be involved as well, but will      stay with the Neurontin 300 mg t.i.d. dose for now.  3. Give the patient right shoulder/rotator cuff exercises, to      strengthen the shoulder girdle and rotator cuff musculature.  Could      consider injections.  I do not see the utility in x-ray of the      shoulder at this point.  4. Use Zanaflex low dose for spasm.  5. I will see her back in about 2 months, with one-month followup with      nurse at the Clinic.      Ranelle Oyster, M.D.  Electronically Signed     ZTS/MedQ  D:  05/04/2008 10:20:06  T:  05/04/2008 10:54:59  Job #:  [id number]   cc:   Cristi Loron, M.D.  Fax: [phone]

## 2011-05-09 NOTE — Procedures (Signed)
NAME:  Chloe Young, Chloe Young NO.:  [id number]   MEDICAL RECORD NO.:  [id number]          PATIENT TYPE:  REC   LOCATION:  [facility]                          FACILITY:  [facility]   PHYSICIAN:  Celene Kras, MD        DATE OF BIRTH:  26-Jun-1951   DATE OF PROCEDURE:  09/10/2007  DATE OF DISCHARGE:                               OPERATIVE REPORT   The patient was kindly referred to [place] through Dr. Faith Rogue.  I  reviewed the available imaging progress to date, overall directed care  approach.  I have discussed treatment limitations and options.  I have  discussed her with Dr. Riley Kill.  We are believing she has escalating pain  secondary to added biomechanical stress above and below surgical  fixation site.  Most problematic referral pattern seems to be C3, C4 and  C5, with contributory innervation addressed.  Imaging suggest.  She has  suprascapular levator scapular extension.  Diagnostic and therapeutic,  it is reasonable to go on and inject the cervical facet at the medial  branch..  I have used models and discussed in lay terms.  I have  examined the patient.   Objectively, diffuse paracervical myofascial discomfort with positive  cervical fetal compression test, right, left, suboccipital compression  test positive up.  Side bending positive.  Nothing new neurologically,  typical pain mechanical diskogenic.   IMPRESSION:  Cervical facet syndrome, degenerative spine disease  cervical spine.   PLAN:  Cervical facet medial branch intervention, C3, C4 and C5 right  and left side, independent needle access points, under local anesthetic.  Predicate further intervention based on need with positive predictive  experience, contemplate moving forward RF.  Discussed this with her.  Questions are answered.  No barrier to communication.  Maintain contact  with Dr. Riley Kill as well.   She is consented for today's procedure.   The patient taken to fluoroscopy suite and placed in the  supine  position. Neck prepped and draped in the usual fashion.  Using a 25-  gauge needle, I advanced under local anesthetic.  The cervical facet at  the medial branch C3, 4 and 5 right and left side independent needle  access points.  I confirmed placement.  I used multiple fluoroscopic  positions.  Once confirmation of needle placement is made, I then inject  0.50 mL lidocaine 1% MPF at each level with a total of 40 mg of  Aristocort in divided dose.  No CSF, heme or paresthesia comfort to the  procedure.   She tolerated the procedure well.  Appropriate recovery, improved at  discharge.  No barrier to communication.  Will follow up with her.  Discharge instructions given.           ______________________________  Celene Kras, MD     HH/MEDQ  D:  09/10/2007 11:50:40  T:  09/10/2007 15:30:22  Job:  [id number]

## 2011-05-09 NOTE — Assessment & Plan Note (Signed)
Chloe Young is back regarding her neck pain and headaches.  Generally, she has  been doing a bit better over the last few months.  She still has days  where she has flares, and I cannot point any specific causes or factors.  The pain will range from 2 to 5/10.  Overall, she likes the Opana ER.  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  Uses the  immediate release for breakthrough pain once or twice a day.  She uses  TENS, heat, massage etc., as well.   REVIEW OF SYSTEMS:  Notable for the above.  She does report some  constipation but takes medications and tries to eat plenty of vegetables  and fiber.   SOCIAL HISTORY:  The patient is working as noted above.  No changes are  noted otherwise.   PHYSICAL EXAMINATION:  VITAL SIGNS:  Blood pressure is 139/80, pulse is  74, and respiratory rate 18.  She is saturating 100% on room air.  GENERAL:  The patient is pleasant, alert, oriented x3.  Affect is bright  and appropriate.  Strength is 5/5 and normal sensory exam today.  Posture is fair.  Cognitively,  she is within normal limits.  Cranial  nerve exam is intact.  HEART:  Regular.  CHEST:  Clear.  ABDOMEN:  Soft, nontender.   ASSESSMENT:  1. Cervical post laminectomy syndrome.  2. Cervical  facet arthropathy.  3. Cervical myofascial pain, partial dysfunction likely related to all      of her pain exacerbations.  4. Headache.  5. Right shoulder pain.   PLAN:  1. I will continue Opana ER 20 mg q. 12 hours.  She seems to have done      well on this.  She can stay with the Opana immediate release for      breakthrough pain 1-2 times a day.  2. We will stop night time Soma.  She will try Amrix 15 mg nightly to      see if this helps her more during the day time hours.  She may go      to 30 mg as needed.  She can back off the night time Zanaflex as      well.  3. Discussed tapering off Neurontin over the next week.  If she has no      problems, we can do __________  without this medication.  4. We will see her back in 2 months with nurse clinic and 4  months      with me.      Ranelle Oyster, M.D.  Electronically Signed     ZTS/MedQ  D:  09/07/2008 10:56:28  T:  09/08/2008 03:49:50  Job #:  [id number]   cc:   Cristi Loron, M.D.  Fax: [phone]

## 2011-05-09 NOTE — Assessment & Plan Note (Signed)
Chloe Young is back regarding her neck pain and headaches.  She had good  results with the right-sided radiofrequency ablations, but not the same  for the left.  She feels that, in fact, they may have irritated the neck  some more, although she is not far above her baseline there.  She rates  her pain at 6-8/10.  Pain is in the left neck radiating somewhat into  the shoulder.  She describes it as burning, constant, tingling, and  aching.  Certainly it is worse with her activity.  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  She uses her  oxycodone for breakthrough pain at this point.  She is on low dose  Neurontin, which I started her on in September, which has provided some  benefit as well for the left shoulder symptoms.  Headaches are still  initiated by her cervical discomfort.  She uses Mobic as her anti-  inflammatory medication.  She uses Lidoderm patches locally for relief,  although these cause some irritation at times.  Sleep is good.   REVIEW OF SYSTEMS:  Notable for spasms, occasional constipations.  Other  pertinent positives listed above and full review is written up in the  history section of the chart.   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   PHYSICAL EXAMINATION:  VITAL SIGNS:  Blood pressure 142/78, pulse is 62,  respiratory rate 18.  She is sating at 97% on room air.  GENERAL:  Patient is pleasant, alert, and oriented x3.  Affect is bright  and appropriate.  MUSCULOSKELETAL/EXTREMITIES:  She has some pain along the lower  trapezius as well as the sternocleidomastoid muscle.  Transverse  processes and facets of the cervical spine are tender and painful with  provocative maneuvers.  She had intact strength in both the arms and  legs bilaterally.  Reflexes are 1+ and 2+ bilaterally.  No sensory  deficits were appreciated.  She had pain  also with rightward rotation  and lateral bending of the neck today.   ASSESSMENT:  1. Cervical decompression at C4-C5, C5-C6.  2. Lumbar facet arthropathy, partially responsive to radiofrequency      ablation.  3. Myofascial pain and postural dysfunction.  4. Headaches.   PLAN:  1. Will increase Neurontin ultimately up to 300 mg t.i.d. over the      next 2 weeks' time to assist with arms symptoms and essentially      headache prophylaxis.  2. Continue Mobic for now as an anti-inflammatory.  3. After informed consent, we injected 2 trigger points along the left      sternocleidomastoid each with 2 mL of 1% lidocaine.  4. We will switch her breakthrough pain medication to Opana 5 mg 1-2      q.6 h. p.r.n. breakthrough pain.  5. Encouraged patient to work on stretching, improve posture and      ergonomics in the workplace.  6. I will see her back in about 2 months' time.      Ranelle Oyster, M.D.  Electronically Signed     ZTS/MedQ  D:  01/08/2008 11:26:31  T:  01/08/2008 12:05:20  Job #:  [id number]   cc:   Cristi Loron, M.D.  Fax: [phone]

## 2011-05-09 NOTE — Procedures (Signed)
NAME:  Chloe, Young NO.:  [id number]   MEDICAL RECORD NO.:  [id number]          PATIENT TYPE:  REC   LOCATION:  [facility]                          FACILITY:  [facility]   PHYSICIAN:  Celene Kras, MD        DATE OF BIRTH:  1951/09/15   DATE OF PROCEDURE:  11/12/2007  DATE OF DISCHARGE:                               OPERATIVE REPORT   [facility].  I have  evaluated her interim history form and 14 point review of systems.  Chloe Young has responded with positive predictive experience to the cervical  facet medial branch intervention first injection, more so than second.  But, we crossed the 75% threshold with improvement in function, range of  motion, and quality of life indices.  In the interim, she has seen Dr.  Lovell Sheehan who states no further surgery, but he is concerned as am I that  she is having added biomechanical stress above the surgical fixation  site.  He is contemplating surgery down the road.   1. To this end, I am going to go ahead and proceed with radiofrequency      neural ablation, this will be the most problematic side, right      side.  Consider the left side if needed in a month or two. I      reviewed the risks, complications and options including bleeding,      infection, nerve damage, stroke, seizure, death, other unforeseen      problems not commonly encountered, and idiosyncratic reaction to      medication.  She understands she is going to be a bit sore after      this intervention.  I plan to go 3, 4, 5, with 6 as an overlay.  6      will be pulsed.  She is to come back in a month and we will      contemplate contralateral intervention.  This will be with the 5 mm      active tip.  I have used models and discussed in lay terms,      questions were answered.  [place] engaging individual, I imagine      she will do well, I renewed her oxycodone.  She is sparingly and      appropriately reviewed.   Objectively, diffuse paracervical myofascia with positive cervical  compression test right-left.  Suboccipital compression test positive.  Side bending positive.  Nothing new neurologically, typical pain  mechanical over diskogenic.   IMPRESSION:  Cervical facet syndrome, degenerative spine disease of the  cervical spine.   PLAN:  Cervical facet medial branch radiofrequency neural ablation, 3,  4, 5, 6 pulse.  She is consented.  Risks, complications, and options  were fully outlined.   DESCRIPTION OF PROCEDURE:  The patient was taken to the fluoroscopy  suite and placed in the supine position,  She was prepped and draped in  the usual fashion. Using a 20 gauge RF needle 5 mm active tip, I  advanced to the appropriate landmarks, 3,  4, 5, and 6, and confirmed  placement in multiple fluoroscopic positions.  I identified no CSF, heme  or paresthesia, confirmed placement in multiple fluoroscopic positions,  and test uneventfully.  She is stimulated and appropriate placement.  We  then proceed with 0.5 mL of lidocaine 1% MPF at each level with a total  of 40 mg Aristocort in divided dose.  Lesion is performed at 60 degrees  for 60 seconds.  Pulse per cycle at less than  42 degrees.  She tolerated the procedure well.  No complications  identified.  Appropriate recovery.  Discharge instructions given.  Questions were answered.  I will see her in follow up.           ______________________________  Celene Kras, MD     HH/MEDQ  D:  11/12/2007 12:17:32  T:  11/12/2007 21:04:49  Job:  [id number]

## 2011-05-09 NOTE — Procedures (Signed)
NAME:  Chloe Young, Chloe Young NO.:  [id number]   MEDICAL RECORD NO.:  [id number]           PATIENT TYPE:   LOCATION:                                 FACILITY:   PHYSICIAN:  Celene Kras, MD        DATE OF BIRTH:  [birth year]   DATE OF PROCEDURE:  10/08/2007  DATE OF DISCHARGE:                               OPERATIVE REPORT   Chloe Young comes in to pain management today.  I evaluated her via  health and history form and 14-point review of systems.   1. Benefited from cervical facet medial branch intervention, less      frequency, intensity, and duration of headaches, better range of      motion, less myofascial pain.  2. Other modifiable features and health profile discussed.  Added      biomechanical stress above-and-below surgical fixation site,      demonstrating by exam historical features symptoms consistent with      C3, C4, C5 and C6 medial branch referral pattern, as well as      mechanical pain.  It is reasonable to go on to intervene at these      levels, and if in the next month or two she has significant      recrudescence consider RF as an option.  3. Follow along expectantly.  Review of medication.  Another rationale      perform intervention and minimize escalation of controlled      substances.   OBJECTIVE:  Diffuse paracervical myofascial discomfort, positive  cervical seated compression test, right-and-left suboccipital  compression test positive.  Nothing new neurologically.   IMPRESSION:  Degenerative spine disease cervical spine.   PLAN:  Cervical facet medial branch intervention C3, C4, C5 and C6 right-  and-left side independent needle access points under local anesthetic.  Predicate further intervention based on need and overall response.  Questions were answered.  No barrier to communication.   The patient taken process in the fluoroscopic suite and placed in supine  position after prep and draped in the usual fashion.  Using a 25-gauge  needle, I advanced the cervical facet at the medial branch under local  anesthetic C3, C4, C5 and C6 right-and-left side.  Independent needle  access points confirmed placement.  I then inject 0.5 mL lidocaine 1%  MPF at each level for a total of 40 mg Aristocort in divided dose.   Tolerated procedure well.  No complications from our procedure.  Appropriate recovery.  Discharge instructions given .  We will see her  in followup.  She will see Dr. Riley Kill, and no barrier to communication.           ______________________________  Celene Kras, MD     HH/MEDQ  D:  10/08/2007 13:05:04  T:  10/09/2007 09:20:06  Job:  [id number]

## 2011-05-09 NOTE — Assessment & Plan Note (Signed)
Chloe Young is back regarding her cervical pain.  In general, she has been  stable.  She does report increased neck pain in the morning after she  works her full-time hours.  She generally works through her day without  taking a rest break.  She is on the Opana ER and IR for baseline and  p.r.n. pain control.  She likes the Amrix at night for spasm, although  it can take more than 15 due to daytime sedation.  She uses Neurontin  still and is off Mobic due to likely increase in her transaminases.  Today, she rates her pain 5/10, described it as dull and aching.  Pain  interferes with general activity, relations with others, enjoyment of  life on a moderate level.  Sleep is generally good.   REVIEW OF SYSTEMS:  Notable for occasional spasms.  Full review is in  the written health and history section of the chart.   SOCIAL HISTORY:  Unchanged.  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   PHYSICAL EXAMINATION:  Blood pressure is 130/80, pulse is 76,  respiratory rate 18.  She is sating 100%.  The patient is pleasant,  alert, and oriented x3.  Neurologically, she is unchanged with good  strength and reflexes in both upper extremities, and the sensation is  2/2.  Posture is fair.  She continues to have some tenderness along the  trapezius in the surgical site and more pain with flexion than  extension.  She remains somewhat limited with full range of motion  overall.  Heart is regular.  Chest is regular.  Abdomen is soft,  nontender.  Cognitively, she is appropriate.   ASSESSMENT:  1. Cervical post laminectomy syndrome.  2. Cervical facet arthropathy and associated myofascial pain/postural      dysfunction.  3. Headaches.  4. Right shoulder pain.   PLAN:  1. Continue Opana ER 20 mg and IR 10 mg #60, #75 respectively.  2. Amrix 15 mg 1-2 in the evening.  3. Recommended stretching breaks and extension of the neck, possibly      using collar  p.r.n. for rest breaks during the day.  She needs to      avoid the chronic flexed position while at work.  4. Recheck LFTs today and observe for potential response to being off      the Mobic.  5. I will see her back in 3 months with 93-month nursing followup.      Chloe Young, M.D.  Electronically Signed     ZTS/MedQ  D:  06/01/2009 12:52:29  T:  06/02/2009 02:53:23  Job #:  [id number]   cc:   Chloe Young, M.D.  Fax: [phone]

## 2011-05-09 NOTE — Procedures (Signed)
NAME:  ALLETA, AVERY NO.:  [id number]   MEDICAL RECORD NO.:  [id number]          PATIENT TYPE:  REC   LOCATION:  [facility]                          FACILITY:  [facility]   PHYSICIAN:  Celene Kras, MD        DATE OF BIRTH:  [birth year]   DATE OF PROCEDURE:  12/09/2007  DATE OF DISCHARGE:                               OPERATIVE REPORT   [facility].  I have  evaluated her and detailed history and 14 point review of systems.   Strong response to right RF, we thought we would just go to the most  problematic side and follow her expectantly.  We are a month out now and  the left side seems to be most problematic side, right is nearly  resolved.  We will go on and RF to the left side, and then see how she  does.  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  She  thinks she is improved to the point where she can get back in the game,  and she wants to minimize escalation of controlled substances.  I will  go ahead and renew during the injection cycling, but I think we will  probably be able to decrease the analgesic load over time.   1. She will follow up with our physiatry colleagues.   1. We plan to the left side and I have reinforced the risks,      complications and options. At C3, C4, C5, C6, inject C7, this is      above and below surgical fixation site where biomechanical stress      is noted, as well as peri-hardware.   Objectively, diffuse paracervical myofascial discomfort, notably right  improved, left still problematic, suprascapular and levator scapular  pain.  Pain with side bending and pain with extension.  Nothing new  neurologically. Typical pain, mechanical diskogenic, more so mechanical,  nothing new neurologically.   IMPRESSION:  Cervical facet syndrome, degenerative spine disease,  cervical spine.   PLAN:  Cervical facet  medial branch radiofrequency neural ablation, 4 mm  active tip, C3, C4, C5, C6, inject C7, under local anesthetic and she is  consented.  Predicate further intervention based on need and overall  response.  I have used models, discussed in lay terms, reviewed this  procedure with her, questions were answered.   The patient was taken to the fluoroscopy suite and placed in the supine  position.  After prep and drape in the usual fashion, using a 22 gauge  RF needle, 4 mm active tip, I advanced to the cervical facet at the  medial branch C3, C4, C5 and C6, inject C7.  Confirmed placement in  multiple fluoroscopic positions.  Motor sensory stem, reconfirmation of  needle, placement at multiple points during the procedure.  We then test  uneventfully and inject in divided dose 4 mL lidocaine 1% MPF with a  total of 40 mg Aristocort  in divided dose.  Lesion performed at 60  degrees for 60 seconds at each level.   She tolerated the procedure well.  No complications from our procedure.  Appropriate recovery. Improved at discharge. Will see her in follow up.  Predicate further intervention based on need and overall response.           ______________________________  Celene Kras, MD     HH/MEDQ  D:  12/10/2007 14:16:48  T:  12/10/2007 22:36:09  Job:  [id number]

## 2011-05-09 NOTE — Assessment & Plan Note (Signed)
Chloe Young is back regarding her cervicalgia.  She found that a Lidoderm  patch is somewhat helpful.  She also likes the Celebrex.  She is noting  that her blood pressure is elevated, although it likely was elevated  prior to starting any medication.  Her PCP is following her BP.  New  medication was added.  She tried to use the Flexeril in place of the  Valium during the day and noticed that it was not controlling pain and  spasm as well.  Percocet covers pain somewhat but she is continuing to  use it every 4 hours or so to control symptoms.  I did review her films  in person today and has a significant disk protrusion at C3 C4 with  osteophytes.  No significant facet disease was obvious.  Patient  described to me the traction unit she used at home, which essentially  was an inflated supine traction unit, and she remembers, the last time  she used it, it irritating her neck in fact.  The patient rates her pain  today a 5/10, describes it as burning, dull, constant aching.  Pain  interferes with general activity, relationships with others and  enjoyment of life on a moderate to severe level.  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   REVIEW OF SYSTEMS:  Notable for the above as well as occasional  constipation and spasms.  A full review is in the written Health &  History Section of the chart.   SOCIAL HISTORY:  The patient is married and noted above.   PHYSICAL EXAM:  Blood pressure is 155/96, pulse is 67, respiratory rate  18, she is sating 100% on room air.  The patient is generally pleasant,  alert and oriented x3.  Affect is generally bright and appropriate.  Patient's coordination was fair.  Reflexes are 2+.  Sensation is intact.  Cognitively, she is appropriate.  Motor function generally 5/5 in the  upper extremities.  She had no rotator cuff or bicipital tendon signs in  her shoulder.  NECK:  Tender with compression maneuvers as well as extension and facet  provocation.  Flexion caused less discomfort.  Spurling's test was  negative.  Remainder of exam was stable.  HEART:  Regular.  CHEST:  Clear.  ABDOMEN:  Soft, nontender.   ASSESSMENT:  1. Cervicalgia with prior cervical fusion at C4 through C6.  2. Questionable cervical facet syndrome.  3. Cervicogenic headaches.   PLAN:  1. Will see if we can have her set up with Dr. Stevphen Rochester for medial      branch blocks at C3 C4 bilaterally.  I do not think a followup      epidural injection is indicated due to her lack of success      previously and the distance her pathology is away from the only      possible interlaminar injection level.  2. Begin trial of Neurontin 100 mg q.h.s., titrating to t.i.d. over      two weeks time to help with any radicular symptoms she may be      having through the shoulder.  3. Will change Percocet to Roxicodone 15 mg one q.6h. p.r.n., #90.      May need to look at a long-acting opiate.  4. Consider outpatient therapy.  Patient will try her supine cervical      traction at low pressures to see if this provides any relief.  5. Will replace Celebrex with  Mobic, once her prescription is      complete, to save her some money.  She should      take this with food, obviously.  6. I will see the patient back pending injections with Dr. Stevphen Rochester.      Ranelle Oyster, M.D.  Electronically Signed     ZTS/MedQ  D:  09/10/2007 10:36:24  T:  09/10/2007 11:56:52  Job #:  [id number]   cc:   Cristi Loron, M.D.  Fax: [phone]

## 2011-05-09 NOTE — Assessment & Plan Note (Signed)
Chloe Young is back regarding her chronic neck pain.  She has been doing  fairly well with her current regimen, although occasionally has a flare  during the day depending on activity levels, sometimes with cold  weather, etc.  Her pain is 5/10.  She describes her pain is dull and  tingling.  Pain interferes with general activity, relationship with  others, and enjoyment of life on a moderate level.  She has been using  Amrix at night with good results for sleep, but insurance will not cover  this any further and she has worried about changing to another  medication.  Sleep is 6-8 hours on average with our current schedule.   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  No other changes were noted.   REVIEW OF SYSTEMS:  Notable for the above.  Full review is in the  written health and history section in the chart.   PHYSICAL EXAMINATION:  VITAL SIGNS:  Blood pressure is 150/91, pulse is  96, respiratory rate 16, and she is sating 100% on room air.  GENERAL:  The patient is pleasant, alert, and oriented x3.  NEUROLOGIC:  She has fair range of motion with the neck, but some  problems with flexion and extension.  Strength is 5/5 with 2/3 reflexes  both upper extremities.  Posture was good.  Cognitively, she is well  within normal limits.  Cranial nerve exam is normal.  HEART:  Regular.  CHEST:  Clear.  ABDOMEN:  Soft and nontender.   ASSESSMENT:  1. Cervical postlaminectomy syndrome.  2. Cervical facet arthropathy.  3. Cervical myofascial pain and postural dysfunction.  4. Headache.  5. Right shoulder pain.   PLAN:  1. Continue Opana ER 20 mg q.12 h, #60 with refill prescription for      next month.  2. Opana IR 10 mg 1 q.12 h. p.r.n., #75.  3. Amrix 15 mg will be changed to Flexeril 10 mg 1-2 at bedtime for      sleep.  Continue Soma 350 mg q.8 h. p.r.n. during the day      essentially for pain and spasm.  4. Continue with Neurontin for now as  she noticed some difference of      the medication.  5. I will see her back in 2 months' time here.      Ranelle Oyster, M.D.  Electronically Signed     ZTS/MedQ  D:  12/29/2008 10:57:51  T:  12/30/2008 02:25:00  Job #:  [id number]   cc:   Cristi Loron, M.D.  Fax: [phone]

## 2011-05-09 NOTE — Assessment & Plan Note (Signed)
Chloe Young is back regarding her neck.  She felt that the Opana long-acting  edition has been helpful.  She does have a gap around the noon and again  around 4 o'clock.  She uses this as immediate release at that point.  She rates her pain of 5-6/10.  Pain is sharp, burning, stabbing, and  aching.  Pain interferes with general activity, relations with others,  and enjoyment of life on a mild-to-moderate level.  She uses her TENS  units and finds this helpful, although she has been using it too much  and developed a couple wounds from prolonged electrode use.   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  No other changes noted.   REVIEW OF SYSTEMS:  Notable for the above as well as some constipation,  although this is generally controlled with laxatives and softeners.  Full review is in the written and health and history section.   PHYSICAL EXAM:  VITAL SIGNS:  Blood pressure is 159/79, pulse 84, and  respiratory rate 16.  GENERAL:  The patient is pleasant, alert, and oriented x3.  NEUROLOGIC:  She has had some pain over the trapezius in the upper  cervical areas.  Both levator scapulae areas were particularly tender  and had taut bands in muscle, which were painful to touch.  She has mild  pain with rotator cuff maneuvers today.  Spurling's test was negative.  Strength was 5/5 in both upper limbs with normal sensory exam and 2+  reflexes.  Posture was fair.  HEART:  Regular.  CHEST:  Clear.  ABDOMEN:  Soft and nontender.   ASSESSMENT:  1. History of cervical postlaminectomy syndrome.  2. Cervical facet arthropathy.  3. Cervical myofascial pain and the postural dysfunction, particularly      related to her job.  4. Headaches.  5. Right shoulder pain.   PLAN:  1. Increase her Opana ER to 20 mg q.12 hours with Opana immediate      release 10 mg one to two times a day p.r.n.  2. We trigger point injected the bilateral levator scapulae muscles,     each receiving 2 mL of 1% lidocaine.  3. Continue TENS and massage as well as ice and heat at home.  I      encouraged appropriate stretching and posture law on the job.  4. I will see her back in 2 months with the Nurse Clinic follow up in      1 month's time.      Ranelle Oyster, M.D.  Electronically Signed     ZTS/MedQ  D:  07/07/2008 13:15:34  T:  07/08/2008 06:04:37  Job #:  [id number]   cc:   Cristi Loron, M.D.  Fax: [phone]

## 2011-05-12 NOTE — Op Note (Signed)
Chloe Young, Chloe Young                 ACCOUNT NO.:  [id number]   MEDICAL RECORD NO.:  [id number]          PATIENT TYPE:  INP   LOCATION:  3007                         FACILITY:  [facility]   PHYSICIAN:  Cristi Loron, M.D.DATE OF BIRTH:  Sep 02, 1951   DATE OF PROCEDURE:  12/20/2005  DATE OF DISCHARGE:  12/21/2005                                 OPERATIVE REPORT   PREOPERATIVE DIAGNOSIS:  C4-5 and C5-6 herniated nucleus pulposus,  spondylosis, stenosis, degenerative disease, cervical radiculopathy,  cervicalgia.   POSTOPERATIVE DIAGNOSIS:  C4-5 and C5-6 herniated nucleus pulposus,  spondylosis, stenosis, degenerative disease, cervical radiculopathy,  cervicalgia.   PROCEDURE:  C4-5 and C5-6 extensive anterior cervical  diskectomy/decompression; interbody iliac crest allograft arthrodesis;  anterior cervical plating (Codman Slim-Loc titanium plate and screws).   SURGEON:  Cristi Loron, M.D.   ASSISTANT:  Clydene Fake, M.D.   ANESTHESIA:  General endotracheal.   ESTIMATED BLOOD LOSS:  50 cc.   SPECIMENS:  None.   DRAINS:  None.   COMPLICATIONS:  None.   INDICATIONS FOR PROCEDURE:  The patient is a 60 year old white female who  has had a long history of neck pain.  She has failed extensive nonsurgical  management.  She was worked up with a cervical MRI and myelo CT.  This  demonstrated significant narrowing and spondylosis at C4-5 and C5-6.  I  discussed the various treatment options with the patient, including surgery.  The patient has weighed the risks, benefits, and alternatives of surgery and  has decided to proceed with a C4-5 and C5-6 anterior cervical diskectomy,  fusion, and plating.   DESCRIPTION OF PROCEDURE:  The patient was brought to the operating room by  the anesthesia team.  General endotracheal anesthesia was induced.  The  patient remained in the supine position.  A roll was placed under her  shoulders to place her neck in slight extension.  Her  anterior cervical  region was then prepared with Betadine scrub and Betadine solution, and  sterile drapes were applied.  I then injected the area to be incised with  Marcaine with epinephrine solution.   I used the scalpel to make a transverse incision in the patient's left  anterior neck.  I used the Metzenbaum scissors to divide the platysma muscle  and then dissect medial to the sternocleidomastoid muscle, jugular vein, and  carotid artery.  I carefully dissected down towards the anterior cervical  spine, identifying the esophagus and retracting it medially.  I used the  Kitner swabs to clear the soft tissue from the anterior cervical spine and  then inserted a bent spinal needle into the upper exposed intervertebral  disk space.  We the obtained intraoperative radiograph to confirm our  location.   I then used electrocautery to detach the medial border off of the longus  colli muscle bilaterally from C5-6 and C4-5 intervertebral disk space.  I  inserted the Caspar self-retaining retractor for exposure and then incised  the C4-5 intervertebral disk with a 15 blade scalpel.  It was quite  spondylotic and collapsed.  I inserted distraction screws  at C4 and C5 to  distract the interspace and then used the high-speed drill to decorticate  the intervertebral endplates of C5, drilled away the remainder of the C4-5  intervertebral disks, drilled away some posterior spondylosis, and then to  thin out the posterior longitudinal ligament.  I then incised the ligament  with the arachnoid knife and then removed it with the Kerrison punch,  undercutting the intervertebral endplates at C4-5, decompressing the thecal  sac, and the performed a generous foraminotomy by the bilateral C5 nerve  root, completing the decompression at this level.   We then repeated this procedure in analogous fashion at C5-6, decompressing  the C5-6 thecal sac and the bilateral C6 nerve root.   Having completed the  decompression of C4-5 and C5-6, I now turned my  attention to the arthrodesis.  I obtained tricortical allograft bone graft  and fashioned to these approximate dimensions, 6 mm height and 1 cm depth.  I then inserted the bone graft into the distracted C5-6 interspace and then  removed the screw from C6 and placed it back in C4.  I distracted from the  C4-5 interspace and placed a second bone graft at the C4-5 interspace.  We  then removed the distraction screws.  There was good snug fit of the bone  graft at both levels.   We now turned our attention to the anterior spinal instrumentation.  I used  the high-speed drill to remove some ventral spondylosis from the C4-5 and C5-  6 intervertebral disk space so that the plate would lay down flat.  I then  drilled two 12-mm holes at C4, 5, and 6.  I then secured the plate in the  vertebral bodies by placing two 12-mm self-tapping screws at C4, 5, and 6.  We then obtained intraoperative radiograph which demonstrated good position  of the plates, screws, and bigraft.  We therefore secured the screws to the  plate by locking its cam.   We then obtained stringent hemostasis using bipolar electrocautery.  We  irrigated the wound out with bacitracin solution.  We removed the Caspar  self-retaining retractor.  We inspected the esophagus for any damage.  There  was none apparent.  We then reapproximated the patient's platysma muscle  with interrupted 3-0 Vicryl suture, the subcutaneous tissue with interrupted  3-0 Vicryl suture, and the skin with Steri-Strips and benzoin.  The wound  was then coated with bacitracin ointment, and sterile dressings were  applied.  The drapes were removed.   The patient was subsequently extubated by the anesthesia team and  transported to the postanesthesia care unit in stable condition.  All  sponge, instrument, and needle counts were correct at the end of this case.     Cristi Loron, M.D.  Electronically  Signed     JDJ/MEDQ  D:  12/21/2005  T:  12/22/2005  Job:  [id number]

## 2011-05-14 NOTE — Op Note (Signed)
Chloe Young, Chloe Young                 ACCOUNT NO.:  [id number]  MEDICAL RECORD NO.:  [id number]           PATIENT TYPE:  LOCATION:                                 FACILITY:  PHYSICIAN:  Jene Every, M.D.    DATE OF BIRTH:  [birth year]  DATE OF PROCEDURE:  05/08/2011 DATE OF DISCHARGE:                              OPERATIVE REPORT   PREOPERATIVE DIAGNOSES:  Degenerative joint disease, lateral meniscus tear of the left knee.  POSTOPERATIVE DIAGNOSES: 1. Complex tear of the lateral meniscus of the left knee. 2. Extensive grade 3 changes and minor grade 4 changes of the femoral     condyle and tibial plateau, left knee. 3. Small tear of the medial meniscus. 4. Grade 3 changes of patellofemoral joint.  PROCEDURES PERFORMED: 1. Left knee arthroscopy. 2. Partial medial and lateral meniscectomy. 3. Light chondroplasty, medial femoral condyles, patella.  HISTORY:  A 60 year old with knee pain, refractory, MRI indicating meniscus tear, indicated for arthroscopic debridement with mechanical symptoms failing conservative treatment.  Risks and benefits discussed including bleeding, infection, DVT, PE, anesthetic complication, especially the need for a total knee.  TECHNIQUE:  With the patient in supine position after induction of adequate general esthesia and 2 g of Kefzol, the left lower extremity was prepped and draped in the usual sterile fashion.  A lateral parapatellar portal and a superomedial parapatellar portal was fashioned with a #11 blade.  Ingress cannula atraumatically placed.  Irrigant was utilized to insufflate the joint.  Under direct visualization, the medial parapatellar portal was fashioned with a #11 blade after localization with 18-gauge needle sparing the medial meniscus.  A medial compartment revealed a minor grade 2 and grade 3 changes.  There was a small radial tear in the medial meniscus.  A straight basket rongeur resected this to a stable base, approximately  15% on the middle third was excised.  The remainder of the compartment was unremarkable.  ACL and PCL were unremarkable.  Lateral compartment revealed extensive grade 3 changes, femoral condyle and tibial plateau, and small areas of grade 4 changes of the femoral condyle and near grade 4 in the tibial plateau.  The lateral meniscus was macerated complex tearing, both throughout the entire meniscus and back to the capsule portion of the meniscus torn from the capsule and subluxing into the joint.  Introduced a Runner, broadcasting/film/video after taking a nerve hook and pulling the posterior fragment into the joint and resecting it to a stable base with combination of that and Arthur wand and shaver, and then utilizing a shaver to perform a meniscectomy to a stable base on the lateral and anterior aspect of the meniscus, we changed portals for this.  We used Arthur wand on that as well, it was stable to probe palpation.  Following that, we did not get posterior of the tibial plateau.  Following this, I decreased the arthroscopic intra-articular pressure which was at 65 throughout the case and reduced it to essentially zero.  I examined, I saw no active bleeding.  It was reinsufflated at 65, copiously lavaged the knee.  I examined the patellofemoral joint.  There  were extensive grade 3 changes of patellofemoral joint.  Light chondroplasty performed here.  Gutters were unremarkable.  Copiously lavaged the knee, all instrumentation was removed.  Portals were closed with 4-0 nylon simple sutures.  Marcaine 0.25% with epinephrine was infiltrated in the joint.  Wound was dressed sterilely.  Awoken without difficulty, and transported to the recovery room in satisfactory condition.  The patient tolerated the procedure well.  There were no complications.  ASSISTANT:  None.     Jene Every, M.D.     Cordelia Pen  D:  05/08/2011  T:  05/08/2011  Job:  [id number]  Electronically Signed by Jene Every M.D. on  05/14/2011 01:30:47 PM

## 2011-05-23 ENCOUNTER — Encounter: Payer: BC Managed Care – PPO | Admitting: Physical Medicine & Rehabilitation

## 2011-07-24 ENCOUNTER — Ambulatory Visit (HOSPITAL_COMMUNITY)
Admission: RE | Admit: 2011-07-24 | Discharge: 2011-07-24 | Disposition: A | Discharge status: Home / Self Care | Payer: BC Managed Care – PPO | Source: Ambulatory Visit | Attending: Specialist | Admitting: Specialist

## 2011-07-24 ENCOUNTER — Other Ambulatory Visit: Payer: Self-pay | Admitting: Specialist

## 2011-07-24 ENCOUNTER — Other Ambulatory Visit (HOSPITAL_COMMUNITY): Payer: Self-pay | Admitting: Specialist

## 2011-07-24 ENCOUNTER — Encounter (HOSPITAL_COMMUNITY): Payer: BC Managed Care – PPO

## 2011-07-24 DIAGNOSIS — Z01818 Encounter for other preprocedural examination: Secondary | ICD-10-CM

## 2011-07-24 DIAGNOSIS — M1710 Unilateral primary osteoarthritis, unspecified knee: Secondary | ICD-10-CM | POA: Insufficient documentation

## 2011-07-24 DIAGNOSIS — Z01812 Encounter for preprocedural laboratory examination: Secondary | ICD-10-CM | POA: Insufficient documentation

## 2011-07-24 DIAGNOSIS — I10 Essential (primary) hypertension: Secondary | ICD-10-CM | POA: Insufficient documentation

## 2011-07-24 DIAGNOSIS — Z79899 Other long term (current) drug therapy: Secondary | ICD-10-CM | POA: Insufficient documentation

## 2011-07-24 LAB — URINALYSIS, ROUTINE W REFLEX MICROSCOPIC
Ketones, ur: NEGATIVE mg/dL
Leukocytes, UA: NEGATIVE
Nitrite: NEGATIVE
Specific Gravity, Urine: 1.008 (ref 1.005–1.030)
pH: 6.5 (ref 5.0–8.0)

## 2011-07-24 LAB — DIFFERENTIAL
Basophils Absolute: 0 10*3/uL (ref 0.0–0.1)
Lymphocytes Relative: 39 % (ref 12–46)
Neutro Abs: 2.4 10*3/uL (ref 1.7–7.7)

## 2011-07-24 LAB — COMPREHENSIVE METABOLIC PANEL
AST: 22 U/L (ref 0–37)
BUN: 14 mg/dL (ref 6–23)
CO2: 30 mEq/L (ref 19–32)
Calcium: 9.7 mg/dL (ref 8.4–10.5)
Creatinine, Ser: 0.6 mg/dL (ref 0.50–1.10)
GFR calc non Af Amer: >60 mL/min (ref >60)

## 2011-07-24 LAB — APTT: aPTT: 27 seconds (ref 24–37)

## 2011-07-24 LAB — CBC
Platelets: 201 10*3/uL (ref 150–400)
RDW: 12.7 % (ref 11.5–15.5)
WBC: 5 10*3/uL (ref 4.0–10.5)

## 2011-07-24 LAB — SURGICAL PCR SCREEN
MRSA, PCR: NEGATIVE
Staphylococcus aureus: NEGATIVE

## 2011-07-24 LAB — PROTIME-INR: INR: 1.02 (ref 0.00–1.49)

## 2011-07-25 ENCOUNTER — Ambulatory Visit (HOSPITAL_COMMUNITY)
Admission: RE | Admit: 2011-07-25 | Discharge: 2011-07-25 | Disposition: A | Discharge status: Home / Self Care | Payer: BC Managed Care – PPO | Source: Ambulatory Visit | Attending: Specialist | Admitting: Specialist

## 2011-07-25 ENCOUNTER — Other Ambulatory Visit (HOSPITAL_COMMUNITY): Payer: Self-pay | Admitting: Specialist

## 2011-07-25 DIAGNOSIS — M25569 Pain in unspecified knee: Secondary | ICD-10-CM | POA: Insufficient documentation

## 2011-07-25 DIAGNOSIS — Z01818 Encounter for other preprocedural examination: Secondary | ICD-10-CM

## 2011-07-25 DIAGNOSIS — M1710 Unilateral primary osteoarthritis, unspecified knee: Secondary | ICD-10-CM | POA: Insufficient documentation

## 2011-07-26 HISTORY — PX: JOINT REPLACEMENT: SHX530

## 2011-07-27 ENCOUNTER — Encounter: Payer: BC Managed Care – PPO | Attending: Neurosurgery | Admitting: Neurosurgery

## 2011-07-27 DIAGNOSIS — Z96659 Presence of unspecified artificial knee joint: Secondary | ICD-10-CM | POA: Insufficient documentation

## 2011-07-27 DIAGNOSIS — M961 Postlaminectomy syndrome, not elsewhere classified: Secondary | ICD-10-CM | POA: Insufficient documentation

## 2011-07-27 DIAGNOSIS — R51 Headache: Secondary | ICD-10-CM | POA: Insufficient documentation

## 2011-07-27 DIAGNOSIS — M542 Cervicalgia: Secondary | ICD-10-CM

## 2011-07-27 NOTE — Assessment & Plan Note (Signed)
[name] is a patient of Dr. Riley Kill seen for her cervicalgia and post cervical laminectomy syndrome.  She states she has had no changes in that condition.  She does have a total right knee arthroplasty plan with Dr. Jene Every in about 2 weeks.  Her average pain is 3/5.  General activity level is 5.  Pains varies throughout the day.  Rest and heat tend to help.  She does not indicate what aggravates her pain.  She walks without assistance.  She climb steps and drives.  She works about 30 hours a week.  REVIEW OF SYSTEMS:  Notable for difficulties described above, otherwise within normal limits.  PAST MEDICAL HISTORY:  Unchanged.  SOCIAL HISTORY:  She is married, lives with husband.  FAMILY HISTORY:  Unchanged.  PHYSICAL EXAMINATION:  VITAL SIGNS:  Blood pressure 117/65, pulse 70, respirations 14 and O2 sats 98 on room air. GENERAL:  She is alert and oriented x3. NEUROLOGIC:  Constitutionally, she is within normal limits.  She has normal gait.  Sensation and strength is good.  ASSESSMENT: 1. 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 2. History of headaches.  PLAN: 1. She will continue her home exercise program as previous. 2. She will continue other medicines as prescribed and she will have     some different pain medicine in the hospital postop with Dr. Shelle Iron. 3. I refilled oxycodone 50 mg 1 p.o. q.8 h. 75 with no refill. 4. Opana ER 10 mg 1 p.o. q.6 h. p.r.n. with no refills. 5. We will see her back in 2 months.  Her questions were encouraged     and answered.     Roderick Sweezy L. Blima Dessert Electronically Signed    RLW/MedQ D:  07/27/2011 09:07:23  T:  07/27/2011 09:38:18  Job #:  [id number]

## 2011-07-31 ENCOUNTER — Other Ambulatory Visit: Payer: Self-pay | Admitting: Internal Medicine

## 2011-08-04 ENCOUNTER — Inpatient Hospital Stay (HOSPITAL_COMMUNITY)
Admission: RE | Admit: 2011-08-04 | Discharge: 2011-08-07 | DRG: 209 | Disposition: A | Discharge status: Home / Self Care | Payer: BC Managed Care – PPO | Source: Ambulatory Visit | Attending: Specialist | Admitting: Specialist

## 2011-08-04 ENCOUNTER — Inpatient Hospital Stay (HOSPITAL_COMMUNITY): Payer: BC Managed Care – PPO

## 2011-08-04 DIAGNOSIS — G894 Chronic pain syndrome: Secondary | ICD-10-CM | POA: Diagnosis present

## 2011-08-04 DIAGNOSIS — I10 Essential (primary) hypertension: Secondary | ICD-10-CM | POA: Diagnosis present

## 2011-08-04 DIAGNOSIS — Z7982 Long term (current) use of aspirin: Secondary | ICD-10-CM

## 2011-08-04 DIAGNOSIS — K219 Gastro-esophageal reflux disease without esophagitis: Secondary | ICD-10-CM | POA: Diagnosis present

## 2011-08-04 DIAGNOSIS — M1710 Unilateral primary osteoarthritis, unspecified knee: Principal | ICD-10-CM | POA: Diagnosis present

## 2011-08-04 DIAGNOSIS — M810 Age-related osteoporosis without current pathological fracture: Secondary | ICD-10-CM | POA: Diagnosis present

## 2011-08-04 LAB — ABO/RH: ABO/RH(D): O POS

## 2011-08-04 LAB — TYPE AND SCREEN: ABO/RH(D): O POS

## 2011-08-05 LAB — CBC
HCT: 33.4 % — ABNORMAL LOW (ref 36.0–46.0)
Platelets: 194 10*3/uL (ref 150–400)
RDW: 13 % (ref 11.5–15.5)
WBC: 10.6 10*3/uL — ABNORMAL HIGH (ref 4.0–10.5)

## 2011-08-05 LAB — BASIC METABOLIC PANEL
Chloride: 95 mEq/L — ABNORMAL LOW (ref 96–112)
GFR calc Af Amer: >60 mL/min (ref >60)
Potassium: 3.6 mEq/L (ref 3.5–5.1)
Sodium: 131 mEq/L — ABNORMAL LOW (ref 135–145)

## 2011-08-06 LAB — BASIC METABOLIC PANEL
BUN: 8 mg/dL (ref 6–23)
Chloride: 92 mEq/L — ABNORMAL LOW (ref 96–112)
Glucose, Bld: 138 mg/dL — ABNORMAL HIGH (ref 70–99)
Potassium: 3.5 mEq/L (ref 3.5–5.1)

## 2011-08-06 LAB — CBC
HCT: 30.7 % — ABNORMAL LOW (ref 36.0–46.0)
Hemoglobin: 10.6 g/dL — ABNORMAL LOW (ref 12.0–15.0)
RBC: 3.35 MIL/uL — ABNORMAL LOW (ref 3.87–5.11)
WBC: 13 10*3/uL — ABNORMAL HIGH (ref 4.0–10.5)

## 2011-08-07 LAB — BASIC METABOLIC PANEL
BUN: 7 mg/dL (ref 6–23)
CO2: 27 mEq/L (ref 19–32)
GFR calc non Af Amer: >60 mL/min (ref >60)
Glucose, Bld: 125 mg/dL — ABNORMAL HIGH (ref 70–99)
Potassium: 3.8 mEq/L (ref 3.5–5.1)

## 2011-08-07 LAB — CBC
HCT: 27.8 % — ABNORMAL LOW (ref 36.0–46.0)
Hemoglobin: 9.5 g/dL — ABNORMAL LOW (ref 12.0–15.0)
MCH: 31.5 pg (ref 26.0–34.0)
MCHC: 34.2 g/dL (ref 30.0–36.0)
RBC: 3.02 MIL/uL — ABNORMAL LOW (ref 3.87–5.11)

## 2011-08-08 NOTE — Op Note (Signed)
Chloe Young, Chloe Young                 ACCOUNT NO.:  [id number]  MEDICAL RECORD NO.:  [id number]  LOCATION:  1606                         FACILITY:  [facility]  PHYSICIAN:  Jene Every, M.D.    DATE OF BIRTH:  [birth year]  DATE OF PROCEDURE: DATE OF DISCHARGE:                              OPERATIVE REPORT   PREOPERATIVE DIAGNOSES:  Degenerative joint disease of the right knee; valgus deformity, 15 degrees.  POSTOPERATIVE DIAGNOSES:  Degenerative joint disease of the right knee; valgus deformity, 15 degrees.  PROCEDURE PERFORMED:  Right total knee arthroplasty.  ANESTHESIA:  General.  ASSISTANT:  Roma Schanz, P.A.  COMPONENTS:  DePuy rotating platform, 2.5 femur, 2.5 tibia; 10-mm insert.  HISTORY:  A 60 year old with valgus deformity of the right knee, progressive, refractory to conserve treatment and indicated for replacement of the degenerated joint.  She had a valgus deformity to 15 degrees, particularly in the lateral compartment of the patellofemoral joint.  Risks and benefits discussed including bleeding, infection, damage to vascular structures, no change in symptoms, worsening symptoms, need for repeat debridement, repeat procedure, DVT, PE, anesthetic complication, arthrofibrosis, etc.  TECHNIQUE:  With the patient in supine position after induction of adequate anesthesia with 2 g of Kefzol, the right lower extremity was prepped and draped and exsanguinated in the usual sterile fashion. Thigh tourniquet was inflated to 300 mmHg.  A midline incision was made over the knee and medial parapatellar arthrotomy was performed and the patella was everted.  Full-thickness flaps developed, knee flexed and tricompartmental osteoarthrosis was noted.  Clear synovial fluid evacuated.  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  Minimal soft tissue elevation medially was  performed. Attention turned towards the distal femur.  With measurement we started taking 2 off the lateral dictated a 12 mm distal femoral cut.  This was pinned, oscillating saw used to perform the distal femoral cut.  Then performed the chamfers, anterior and posterior soft tissues protected at all times.  Attention turned towards the tibia, subluxed.  Remnant of medial lateral menisci removed.  Geniculates were cauterized.  External alignment guide was utilized bisecting the ankle joint, parallel to the tibial shaft.  We took initially 2 off the defect, which was posterior laterally, pinned it, performed the proximal tibial cut.  Prior to that, once we performed a distal femoral cut, the chamfers were performed after we performed a distal femoral cut after we entered the femoral canal with a step drill, irrigated and put a 3 degrees right intramedullary guide and pinned it and had to take 12 off the distal femur.  We then put the femoral distal sizing block to size the femur and we sized off the anterior cortex of the femur, measured it to 2.5 and pinned it in 3 degrees of external rotation and then put our distal femoral cutting block and performed our chamfer cuts.  Back towards the tibia, after 2 mm were taken, the proximal tibial cut was performed.  We tried a trial.  We turned our attention towards checking the flexion/extension gaps.  We were tight in actually in flexion, initially with a  10-mm insert and therefore went back and took the 2 mm off the distal femur and the chamfer cuts were performed as well.  We did this utilizing the previous pin holes and using the cutting jig and resecting additional 2 mm.  We then checked our flexion/extension gap, seen to be slightly tight in both flexion and extension and therefore we removed 2 additional millimeters off the tibia after resetting the proximal tibial jig utilizing the pin holes.  Our flexion/extension gaps were  equivalent with 10 mm and adequate space with full extension and full flexion. Attention turned towards completing the remainder of the tibia.  We used a baseplate and measured it 2.5 on the appropriate rotation, just medial side of the tibial tubercle, which was pinned.  Central drill was utilized and the punch guide was utilized as well.  We turned our attention back towards the femur, put a distal block cutting guide bisecting the condyles, making our block cut.  Then put a trial femur and the 2.5 insert.  She had full extension, full flexion, good stability to varus and valgus stress in 0 to 30 degrees and patellofemoral tracking seemed to be satisfactory.  Then turned our attention towards the patella, everted it and measured to 24, took off 8 to 16 utilizing our patellar planing guide, using oscillating saw to cut the patella, measured it to 16, drilled our three peg holes after medializing in the guide, put our trial patella and the patella tracked satisfactorily with the flexion and extension and no tilt.  Next, instrumentation was removed, we checked posteriorly any osteophytes removed or loose bodies.  Pulsatile lavage utilized to clean the operative site and the distal femur and proximal tibia from the bone marrow and then we flexed the knee, __________ was placed.  All surfaces thoroughly dried.  No active bleeding.  We then mixed cement in the back table in appropriate fashion and injected it into the tibial central canal, digitally pressurized it and then cemented the proximal tibia, took our tibial tray to 5 and impacted it on the tibia.  Redundant cement removed.  We then cemented our femur with the runners as well. Redundant cement removed, placed a 10 insert and reduced it, held it in extension with an axial load applied throughout the curing of cement. Redundant cement removed.  We cemented patella as well after curing of cement.  We had good flexion, good extension,  good stability to varus and valgus stress in 0 to 30 degrees, and good patellofemoral tracking, removed the insert and redundant cement removed, cleaned the joint, copiously irrigated it, put our permanent rotating platform with 10 mm insert in and reduced it, full extension, full flexion and had good stability to varus and valgus stress in 0 to 30 degrees, negative anterior drawer at 90 degrees and good patellofemoral tracking.  Next placed a Hemovac and brought out through a lateral stab wound in skin. Marcaine with epinephrine was placed in the joint.  With slight flexion to tension the patellar ligament and the quadriceps tendon.  We then repaired the patellar arthrotomy with #1 Vicryl with interrupted figure- of-eight sutures.  The patella was level.  Then we flexion extended it with good patellofemoral tracking.  No clunk was appreciated.  No instability.  We then closed after the patellar arthrotomy, subcu with 2- 0 Vicryl simple sutures, skin was reapproximated with staples, the patient had flexion to 90 degrees of gravity.  We had closed the skin with staples.  Wound was  dressed sterilely.  Tourniquet was deflated and with adequate revascularization in the lower extremity appreciated.  The patient tolerated the procedure well.  No complications.  TOURNIQUET TIME:  1 hour and __________ minutes.  BLOOD LOSS:  Minimal.     Jene Every, M.D.     JB/MEDQ  D:  08/04/2011  T:  08/05/2011  Job:  [id number]  Electronically Signed by Jene Every M.D. on 08/08/2011 01:24:37 PM

## 2011-08-08 NOTE — H&P (Signed)
  NAMEIGNACIA, Young                 ACCOUNT NO.:  [id number]  MEDICAL RECORD NO.:  [id number]  LOCATION:                                 FACILITY:  PHYSICIAN:  Jene Every, M.D.    DATE OF BIRTH:  [birth year]  DATE OF ADMISSION:  08/04/2011 DATE OF DISCHARGE:                             HISTORY & PHYSICAL   CHIEF COMPLAINT:  Right knee pain.  HISTORY:  [name] is a pleasant 60 year old female with longstanding history of knee pain.  She has undergone knee arthroscopy with minimal relief of her symptoms.  She has persistent swelling, disabling pain with ambulating.  She does note locking and giving away of the knee. Radiographs do reveal tricompartmental osteoarthritis.  At this point, it was felt the patient would benefit from a total knee replacement. Risks and benefits of this were discussed with the patient.  She does elect to proceed.  MEDICAL HISTORY: 1. Hypertension. 2. Chronic pain syndrome.  CURRENT MEDICATIONS:  Opana, metoprolol, OxyIR, Lidoderm, Voltaren gel, over-the-counter Prevacid, multivitamins, Mobic.  PAST SURGERIES: 1. Anterior cervical fusion. 2. Right knee arthroscopy. 3. Right ankle surgery. 4. Left knee arthroscopy.  SOCIAL HISTORY:  The patient is married.  She is a Interior and spatial designer.  History of tobacco.  She does drink occasional alcohol.  She plans to go home following her surgery.  She has been in pain management with Dr. Cato Mulligan. Primary care physician Dr. Alwyn Ren.  FAMILY HISTORY:  Father significant for cancer.  Mother significant for fissures.  REVIEW OF SYSTEMS:  The patient denies any fever, chills, night sweats, or bleeding tendencies.  CNS: No blurred double vision, seizure, headache, paralysis.  RESPIRATORY:  No shortness of breath, productive cough, or hemoptysis.  CARDIOVASCULAR:  No chest pain, angina, or orthopnea.  GU: No dysuria, hematuria, or discharge.  MUSCULOSKELETAL: Pertinent per HPI.  PHYSICAL EXAMINATION:  VITAL SIGNS:   Pulse is 70, respiration 10, BP 150/106. GENERAL:  This is a thin female who walks with antalgic gait, slightly anxious in nature. HEENT: Atraumatic, normocephalic.  Pupils equal, round, reactive to light. NECK:  Supple.  No lymphadenopathy. CHEST: Clear to auscultation bilaterally. Heart: Regular rate and rhythm without murmurs, gallops, or rubs. ABDOMEN:  Soft, nontender, nondistended.  Bowel sounds x4. SKIN:  No rashes or lesions are noted in regard the knee.  The patient does have mild effusion.  She is tender along medial lateral joint line. There she does have a valgus deformity.  Range of motion is -3 to 120 degrees, mild instability with stressing.  IMPRESSION:  Degenerative joint disease, right knee.  PLAN:  The patient will undergo right total knee arthroplasty.     Roma Schanz, P.A.   ______________________________ Jene Every, M.D.    CS/MEDQ  D:  08/03/2011  T:  08/03/2011  Job:  [id number]  Electronically Signed by Roma Schanz P.A. on 08/07/2011 03:40:02 PM Electronically Signed by Jene Every M.D. on 08/08/2011 01:24:35 PM

## 2011-09-04 NOTE — Discharge Summary (Signed)
NAMEMIRYAH, RALLS                 ACCOUNT NO.:  [id number]  MEDICAL RECORD NO.:  [id number]  LOCATION:  1606                         FACILITY:  [facility]  PHYSICIAN:  Jene Every, M.D.    DATE OF BIRTH:  02/28/1951  DATE OF ADMISSION:  08/04/2011 DATE OF DISCHARGE:  08/07/2011                              DISCHARGE SUMMARY   ADMISSION DIAGNOSES:  Include 1. Degenerative joint disease, right knee. 2. Hypertension. 3. Chronic pain syndrome.  DISCHARGE DIAGNOSES:  Include 1. Degenerative joint disease, right knee. 2. Hypertension. 3. Chronic pain syndrome. 4. Status post right total knee arthroplasty. 5. Asymptomatic postoperative anemia.  HISTORY:  Ms. Graybeal is a pleasant 60 year old female with a longstanding history of right knee pain.  She has undergone previous knee arthroscopy with only minimal relief of symptoms.  She has noted persistent swelling and disabling pain with ambulation with some instability.  Radiographs do reveal tricompartmental osteoarthritis.  It is felt at this time that the patient would benefit from a total knee arthroplasty.  CONSULT:  PT/OT Care Management.  PROCEDURE:  Patient was taken to the OR, underwent right total knee arthroplasty.  SURGEON:  Jene Every, M.D.  ASSISTANT:  Roma Schanz, P.A.  ANESTHESIA:  General.  COMPLICATIONS:  None.  LABORATORY DATA:  Postoperative CBC showed white cell count 10.6, hemoglobin 11.6, hematocrit 33.4.  This was monitored throughout the hospital stay.  White cell count normalized at 10.1 at the time of discharge and hemoglobin 9.5, hematocrit 27.8.  Routine chemistries postoperatively show sodium 131, potassium 3.6 with normal BUN and creatinine, slightly elevated glucose of 128, this was followed during the hospital stay.  At time of discharge, sodium was 130, potassium was 3.8, BUN was 7, creatinine was 0.47.  GFR was greater than 60.  Blood type is O positive.  I do not see a preoperative  chest x-ray in the chart.  HOSPITAL COURSE:  The patient was admitted, taken to the OR, and underwent the above stated procedures without difficulty.  One Hemovac drain was placed intraoperatively.  She was then transferred to the PACU and then to the Orthopedic floor.  She was placed on PCA for pain relief.  Postop day #1, patient was having significant pain.  She did have a history of chronic pain syndrome and was on chronic narcotics.  She was not tolerating PO's nor PCA.  Vital signs were stable.  She was afebrile.  Hemoglobin was stable at 11.6.  Hemovac drain was removed. Dressing was clean and dry.  Motor and neurovascular functions were intact to the lower extremity.  At this point, we adjusted her PCA.  We did adjust her scheduled narcotics.  Incentive spirometer was encouraged, this was relatively started per pharmacy.  PT/OT was consulted.  Postop day #2, patient was doing significantly better.  Dressing was changed.  Incision was clean, dry, and intact.  Motor and neurovascular functions were intact to lower extremities.  PCA was discontinued as well as Foley.  Postop day #3, patient was doing significantly better.  Pain was controlled on p.o. analgesics.  Denied any shortness of breath or chest pain.  Vital signs were stable.  Incision remained clean and  dry.  No evidence of infection.  DISPOSITION:  Patient was stable to be discharged to home __________ Home Health needs.  She is to follow up with Dr. Shelle Iron approximately 10 to 14 days for suture removal and x-ray.  ACTIVITY:  She is to ambulate as tolerated.  Elevate the leg above the heart level at least 6 times a day for 20 minutes at a time.  She will need to wear a knee immobilizer until she can straight leg raise x10, then she can discontinue that.  Medications as per med rec sheet.  She will need to be on Xarelto for a total of 21 days for DVT prophylaxis.  DIET:  As tolerated.  CONDITION ON DISCHARGE:   Stable.  FINAL DIAGNOSIS:  Status post right total knee arthroplasty.     Roma Schanz, P.A.   ______________________________ Jene Every, M.D.    CS/MEDQ  D:  08/25/2011  T:  08/25/2011  Job:  [id number]  Electronically Signed by Jene Every M.D. on 09/04/2011 03:05:47 PM

## 2011-09-18 ENCOUNTER — Encounter
Payer: BC Managed Care – PPO | Attending: Physical Medicine & Rehabilitation | Admitting: Physical Medicine & Rehabilitation

## 2011-09-18 DIAGNOSIS — M5382 Other specified dorsopathies, cervical region: Secondary | ICD-10-CM

## 2011-09-18 DIAGNOSIS — M531 Cervicobrachial syndrome: Secondary | ICD-10-CM

## 2011-09-18 DIAGNOSIS — R51 Headache: Secondary | ICD-10-CM | POA: Insufficient documentation

## 2011-09-18 DIAGNOSIS — Z96659 Presence of unspecified artificial knee joint: Secondary | ICD-10-CM | POA: Insufficient documentation

## 2011-09-18 DIAGNOSIS — M1710 Unilateral primary osteoarthritis, unspecified knee: Secondary | ICD-10-CM | POA: Insufficient documentation

## 2011-09-18 DIAGNOSIS — M961 Postlaminectomy syndrome, not elsewhere classified: Secondary | ICD-10-CM

## 2011-09-18 DIAGNOSIS — M129 Arthropathy, unspecified: Secondary | ICD-10-CM | POA: Insufficient documentation

## 2011-09-18 DIAGNOSIS — M542 Cervicalgia: Secondary | ICD-10-CM

## 2011-09-18 NOTE — Assessment & Plan Note (Signed)
HISTORY OF PRESENT ILLNESS:  Chloe Young is back regarding her cervicalgia and headaches.  She had a knee replaced in August by Dr. Shelle Iron and she notes after being out a few weeks from work that her neck pain had substantially improved.  Pain ranges from a 2-9/10.  It is more associated with activity, however.  Her plan at this point is to go back to work in November.  REVIEW OF SYSTEMS:  Notable for the above.  Full 12-point review is in the written history section of the chart.  She is working on range of motion in the right knee.  SOCIAL HISTORY:  As noted above.  Husband is supportive.  PHYSICAL EXAMINATION:  VITAL SIGNS:  Blood pressure is 145/97, pulses 80, respiratory 16, she is saturating 99% on room air. NEUROLOGIC:  The patient is pleasant, alert, and oriented x3.  Affect is only bright and appropriate.  She has some limitation still with flexion and extension.  Strength and sensation is normal in both upper extremities.  She can flex the knee to about 90-95 degrees and extend it to about -5 from full extension.  She has some difficulty moving from a sit to stand position but when she is standing up seems to walk fairly well with good balance and weight shift.  ASSESSMENT: 1. 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 2. History of headaches. 3. Right total knee replacement and osteoarthritis of the left knee.  PLAN: 1. Continue working on knee range of motion and strength. 2. It is ultimately up to her regarding her vocational plan.  I raised     the possibility that she may look into another field of work that     may not be so taxing on her neck and cause positional pain. 3. I will see the patient back in about 6 months.  She will follow up     with my nurse practitioner in 2 months.  I refilled her oxycodone     15 mg 1 q.8 h. p.r.n. #75 with a second prescription for next     month. 4. I also refilled Opana ER 30 mg one q.12 h., #60 with a second  prescription for next month.     Ranelle Oyster, M.D. Electronically Signed    ZTS/MedQ D:  09/18/2011 11:27:42  T:  09/18/2011 11:42:46  Job #:  [id number]

## 2011-09-27 ENCOUNTER — Other Ambulatory Visit: Payer: Self-pay | Admitting: Internal Medicine

## 2011-09-27 NOTE — Telephone Encounter (Signed)
Patient needs to schedule a CPX  

## 2011-11-13 ENCOUNTER — Encounter: Payer: BC Managed Care – PPO | Attending: Neurosurgery | Admitting: Neurosurgery

## 2011-11-13 DIAGNOSIS — M542 Cervicalgia: Secondary | ICD-10-CM | POA: Insufficient documentation

## 2011-11-13 DIAGNOSIS — M1710 Unilateral primary osteoarthritis, unspecified knee: Secondary | ICD-10-CM | POA: Insufficient documentation

## 2011-11-13 DIAGNOSIS — G8929 Other chronic pain: Secondary | ICD-10-CM | POA: Insufficient documentation

## 2011-11-13 DIAGNOSIS — M961 Postlaminectomy syndrome, not elsewhere classified: Secondary | ICD-10-CM | POA: Insufficient documentation

## 2011-11-13 DIAGNOSIS — R51 Headache: Secondary | ICD-10-CM | POA: Insufficient documentation

## 2011-11-13 DIAGNOSIS — G894 Chronic pain syndrome: Secondary | ICD-10-CM

## 2011-11-13 DIAGNOSIS — Z96659 Presence of unspecified artificial knee joint: Secondary | ICD-10-CM | POA: Insufficient documentation

## 2011-11-13 NOTE — Assessment & Plan Note (Signed)
This is a patient of Dr. Riley Kill who is seen for chronic neck pain.  She reports having problems getting her Lidoderm patches, states lidocaine gel is not working as well.  Her average pain is a 4.  Her general activity level is 7.  The pain is worse in the morning, daytime and evening.  Sleep patterns are fair.  She does not indicate what worsens the pain.  Pain improves with rest, heat, medication and TENS unit.  She walks without assistance.  She climb steps and drives.  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  REVIEW OF SYSTEMS:  Notable for difficulties described above, otherwise, within normal limits.  PAST MEDICAL HISTORY:  Unchanged.  SOCIAL HISTORY:  Unchanged.  FAMILY HISTORY:  Unchanged.  PHYSICAL EXAMINATION:  Blood pressure is 136/84, pulse 88, respirations 14, O2 sats 93 on room air.  Motor strength and sensation are intact. Constitutionally, she is within normal limits.  She is alert and oriented x3.  ASSESSMENT: 1. Cervical post laminectomy syndrome. 2. History of chronic headaches. 3. Right total knee arthroplasty with osteoarthritis of the left knee,     chronic pain.  PLAN:  We will try again to get her Lidoderm transdermal, filled of 5%, apply to daily 180 with no refill.  She received her 2 months worth Opana 30 mg 1 p.o. q.12 hours 60 with no refill, and 2 months worth of oxycodone 15 mg 1 p.o. q.8 hours 75 with no refill.  Her questions were encouraged and answered.  I will see her back in a month.     Chloe Young L. Blima Dessert Electronically Signed    RLW/MedQ D:  11/13/2011 13:18:38  T:  11/13/2011 20:11:24  Job #:  [id number]

## 2011-11-14 ENCOUNTER — Ambulatory Visit: Payer: BC Managed Care – PPO | Admitting: Neurosurgery

## 2011-11-24 ENCOUNTER — Other Ambulatory Visit: Payer: Self-pay | Admitting: Otolaryngology

## 2011-12-07 ENCOUNTER — Other Ambulatory Visit: Payer: Self-pay | Admitting: Internal Medicine

## 2011-12-27 ENCOUNTER — Encounter (HOSPITAL_COMMUNITY): Payer: Self-pay | Admitting: Pharmacy Technician

## 2012-01-03 NOTE — H&P (Signed)
Chief Complaint:  left knee pain.  Subjective: Patient is admitted for left total knee arthroplasty.  Patient is a 61 y.o. female who has been seen by Dr. Shelle Iron for ongoing hip pain.  They have been followed and found to have continued progressive pain.  They have been treated conservatively in the past including medications.  Despite conservative measures, they continue to have pain.  X-rays show that the patient has tricompartmental arthritis.  It is felt that they would benefit from undergoing surgical intervention.  Risks and benefits have been discussed with the patient and they elect to proceed with surgery.  The patient has no contraindications to the upcoming procedure such as ongoing infection or progressive neurological disease.  Allergies: No Known Allergies   Medications: Opana, metoprolol, OxyIR, Lidoderm, Voltaren gel,  over-the-counter Prevacid, multivitamins, Mobic.  Past Medical History: HTN Chronic pain syndrome  Past Surgical History:   Anterior cervical fusion.   Right knee arthroscopy.   Right ankle surgery.   Left knee arthroscopy  Family History:  Father significant for cancer. Mother significant for  fissures.   Social History: The patient is married. She is a Interior and spatial designer. History  of tobacco. She does drink occasional alcohol. She plans to go home  following her surgery. She has been in pain management with Dr. Cato Mulligan.  Primary care physician Dr. Alwyn Ren.   Review of Systems General: No chills, fevers, night sweats, fatigue. Neuro:  No seizures, syncope, paralysis, visual problems. Respiratory:  No shortness of breath, productive cough, hemoptysis. Cardiovascular: No chest pain, angina, palpitations, orthopnea. GI: No nausea, vomiting, diarrhea, constipation. GU:  No dysuria, hematuria, discharge. Musculoskeletal:  Joint pain    Physical Exam:  GENERAL: Patient is a 61 y.o. female, well-nourished, well-developed, no acute distress. Alert,  oriented, cooperative. HENT:  Normocephalic, atraumatic. Pupils round and reactive. EOMs intact. NECK:  Supple, no bruits. CHEST:  Clear to anterior chest wall with posterior wheezes bilaterally. No rhonchi and rales HEART:  Regular, rate and rhythm.  No murmurs.  S1 and S2 noted. ABDOMEN:  Soft, nontender, bowel sounds present. RECTAL/BREAST/GENITALIA:  Not done, not pertinent to present illness. EXTREMITIES:  -10 to 90  TTP medial joint line, mild effusion  Vitals: Blood Pressure: 150/100   Assessment/Plan: End stage arthritis, left knee  The patient is being admitted to [facility] to undergo a left total knee arthroplasty.  Surgery will be performed by Dr. Jene Every.  Risks and benefits have been discussed with the patient and they elect to proceed wth the procedure.

## 2012-01-08 ENCOUNTER — Encounter (HOSPITAL_COMMUNITY)
Admission: RE | Admit: 2012-01-08 | Discharge: 2012-01-08 | Disposition: A | Discharge status: Home / Self Care | Payer: BC Managed Care – PPO | Source: Ambulatory Visit | Attending: Specialist | Admitting: Specialist

## 2012-01-08 ENCOUNTER — Ambulatory Visit (HOSPITAL_COMMUNITY)
Admission: RE | Admit: 2012-01-08 | Discharge: 2012-01-08 | Disposition: A | Discharge status: Home / Self Care | Payer: BC Managed Care – PPO | Source: Ambulatory Visit | Attending: Specialist | Admitting: Specialist

## 2012-01-08 ENCOUNTER — Encounter (HOSPITAL_COMMUNITY): Payer: Self-pay

## 2012-01-08 DIAGNOSIS — M25469 Effusion, unspecified knee: Secondary | ICD-10-CM | POA: Insufficient documentation

## 2012-01-08 DIAGNOSIS — Z01812 Encounter for preprocedural laboratory examination: Secondary | ICD-10-CM | POA: Insufficient documentation

## 2012-01-08 LAB — CBC
HCT: 36.2 % (ref 36.0–46.0)
MCV: 91 fL (ref 78.0–100.0)
RBC: 3.98 MIL/uL (ref 3.87–5.11)
WBC: 4 10*3/uL (ref 4.0–10.5)

## 2012-01-08 LAB — DIFFERENTIAL
Basophils Absolute: 0 10*3/uL (ref 0.0–0.1)
Basophils Relative: 0 % (ref 0–1)
Eosinophils Absolute: 0.1 10*3/uL (ref 0.0–0.7)
Monocytes Absolute: 0.4 10*3/uL (ref 0.1–1.0)
Neutro Abs: 2 10*3/uL (ref 1.7–7.7)
Neutrophils Relative %: 51 % (ref 43–77)

## 2012-01-08 LAB — URINALYSIS, ROUTINE W REFLEX MICROSCOPIC
Glucose, UA: NEGATIVE mg/dL
Hgb urine dipstick: NEGATIVE
Protein, ur: NEGATIVE mg/dL
pH: 6.5 (ref 5.0–8.0)

## 2012-01-08 LAB — COMPREHENSIVE METABOLIC PANEL
AST: 24 U/L (ref 0–37)
BUN: 13 mg/dL (ref 6–23)
CO2: 27 mEq/L (ref 19–32)
Chloride: 102 mEq/L (ref 96–112)
Creatinine, Ser: 0.61 mg/dL (ref 0.50–1.10)
GFR calc non Af Amer: >90 mL/min (ref >90)
Total Bilirubin: 0.4 mg/dL (ref 0.3–1.2)

## 2012-01-08 LAB — TYPE AND SCREEN: ABO/RH(D): O POS

## 2012-01-08 NOTE — Patient Instructions (Signed)
19 Chloe Young  01/08/2012   Your procedure is scheduled on:  Thursday  1/17  AT 7:30 AM  Report to [facility] at 5:30 AM.  Call this number if you have problems the morning of surgery: [phone]   Remember:   Do not eat food OR DRINK ANYTHING AFTER MIDNIGHT THE NIGHT BEFORE YOUR SURGERY.    Take these medicines the morning of surgery with A SIP OF WATER: METOPROLOL, PRILOSEC, OPANA ER   Do not wear jewelry, make-up or nail polish.  Do not wear lotions, powders, or perfumes.   Do not shave 48 hours prior to surgery.  Do not bring valuables to the hospital.  Contacts, dentures or bridgework may not be worn into surgery.  Leave suitcase in the car. After surgery it may be brought to your room.  For patients admitted to the hospital, checkout time is 11:00 AM the day of discharge.   Patients discharged the day of surgery will not be allowed to drive home.   Special Instructions: CHG Shower Use Special Wash: 1/2 bottle night before surgery and 1/2 bottle morning of surgery.   Please read over the following fact sheets that you were given: Blood Transfusion Information and MRSA Information AND INCENTIVE SPIROMETRY INSTRUCTIONS

## 2012-01-08 NOTE — Pre-Procedure Instructions (Signed)
PT HAS EKG REPORT FROM [facility] - DONE 05/08/11 AND CXR REPORT FROM [facility] DONE 07/24/11--COPIES ARE ON PT'S CHART

## 2012-01-09 ENCOUNTER — Encounter: Payer: BC Managed Care – PPO | Attending: Neurosurgery | Admitting: Neurosurgery

## 2012-01-09 DIAGNOSIS — M961 Postlaminectomy syndrome, not elsewhere classified: Secondary | ICD-10-CM

## 2012-01-09 DIAGNOSIS — M1710 Unilateral primary osteoarthritis, unspecified knee: Secondary | ICD-10-CM

## 2012-01-09 DIAGNOSIS — G894 Chronic pain syndrome: Secondary | ICD-10-CM

## 2012-01-10 NOTE — Assessment & Plan Note (Signed)
This is a patient of Dr. Riley Kill seen for knee osteoarthritis as well as cervical post laminectomy syndrome.  The patient reports pain at a 4-5. It is a dull and aching pain.  General activity level is 4. Pain is the same 24 hours a day.  Sleep patterns are good.  Pain is worse with activity.  Rest, heat, therapy and medication tends to help.  She walks without assistance.  She does climb steps and drive. She can walk about 30 minutes at a time.  She works 30 hours a week.  REVIEW OF SYSTEMS:  Notable for the difficulties described above, otherwise unremarkable.  PAST MEDICAL HISTORY, SOCIAL HISTORY AND FAMILY HISTORY:  Unchanged.  PHYSICAL EXAMINATION:  VITAL SIGNS:  Blood pressure is 113/73, pulse 75, respirations 18 and O2 sats 98 on room air. EXTREMITIES:  Motor strength and sensation are intact. CONSTITUTIONALLY.  She is within normal limits.  She is alert and oriented x3.  She has a slight limp.  ASSESSMENT: 1. Cervical postlaminectomy syndrome. 2. Right knee arthroplasty and left knee arthroplasty.  Plan for this     Thursday with Dr. Shelle Iron.  PLAN:  She was given 2 prescriptions, 1 for this month, 1 for next month for Opana ER 30 mg 1 p.o. q.12 h. 60 with no refill and oxycodone 15 mg 1 p.o. q.8 h. 75 with no refill.  Her questions were encouraged and answered.  She will follow up here in 2 months.     Valerie Fredin L. Blima Dessert Electronically Signed    RLW/MedQ D:  01/09/2012 11:03:46  T:  01/10/2012 16:10:96  Job #:  [id number]

## 2012-01-11 ENCOUNTER — Inpatient Hospital Stay (HOSPITAL_COMMUNITY)
Admission: RE | Admit: 2012-01-11 | Discharge: 2012-01-15 | DRG: 209 | Disposition: A | Discharge status: Home Health | Payer: BC Managed Care – PPO | Source: Ambulatory Visit | Attending: Specialist | Admitting: Specialist

## 2012-01-11 ENCOUNTER — Encounter (HOSPITAL_COMMUNITY)
Admission: RE | Disposition: A | Discharge status: Home Health | Payer: Self-pay | Source: Ambulatory Visit | Attending: Specialist

## 2012-01-11 ENCOUNTER — Encounter (HOSPITAL_COMMUNITY): Payer: Self-pay

## 2012-01-11 ENCOUNTER — Inpatient Hospital Stay (HOSPITAL_COMMUNITY): Payer: BC Managed Care – PPO | Admitting: Anesthesiology

## 2012-01-11 ENCOUNTER — Inpatient Hospital Stay (HOSPITAL_COMMUNITY): Payer: BC Managed Care – PPO

## 2012-01-11 ENCOUNTER — Encounter (HOSPITAL_COMMUNITY): Payer: Self-pay | Admitting: Anesthesiology

## 2012-01-11 DIAGNOSIS — I10 Essential (primary) hypertension: Secondary | ICD-10-CM

## 2012-01-11 DIAGNOSIS — M79609 Pain in unspecified limb: Secondary | ICD-10-CM

## 2012-01-11 DIAGNOSIS — G894 Chronic pain syndrome: Secondary | ICD-10-CM | POA: Diagnosis present

## 2012-01-11 DIAGNOSIS — M959 Acquired deformity of musculoskeletal system, unspecified: Secondary | ICD-10-CM | POA: Diagnosis present

## 2012-01-11 DIAGNOSIS — I498 Other specified cardiac arrhythmias: Secondary | ICD-10-CM | POA: Diagnosis not present

## 2012-01-11 DIAGNOSIS — E871 Hypo-osmolality and hyponatremia: Secondary | ICD-10-CM | POA: Diagnosis not present

## 2012-01-11 DIAGNOSIS — M1710 Unilateral primary osteoarthritis, unspecified knee: Principal | ICD-10-CM | POA: Diagnosis present

## 2012-01-11 HISTORY — PX: TOTAL KNEE ARTHROPLASTY: SHX125

## 2012-01-11 SURGERY — ARTHROPLASTY, KNEE, TOTAL
Anesthesia: General | Site: Knee | Laterality: Left | Wound class: Clean

## 2012-01-11 MED ORDER — OXYMORPHONE HCL ER 30 MG PO T12A
30.0000 mg | EXTENDED_RELEASE_TABLET | Freq: Two times a day (BID) | ORAL | Status: DC
  Administered 2012-01-11 – 2012-01-12 (×2): 30 mg via ORAL
  Administered 2012-01-12: 20:00:00 via ORAL
  Administered 2012-01-13 – 2012-01-14 (×3): 30 mg via ORAL
  Administered 2012-01-14: 09:00:00 via ORAL
  Administered 2012-01-15: 30 mg via ORAL
  Filled 2012-01-11 (×4): qty 1

## 2012-01-11 MED ORDER — CYCLOBENZAPRINE HCL 10 MG PO TABS
15.0000 mg | ORAL_TABLET | Freq: Every day | ORAL | Status: DC | PRN
  Filled 2012-01-11: qty 1.5

## 2012-01-11 MED ORDER — METOCLOPRAMIDE HCL 10 MG PO TABS: 5.0000 mg | ORAL_TABLET | Freq: Three times a day (TID) | ORAL | Status: DC | PRN

## 2012-01-11 MED ORDER — LORAZEPAM BOLUS VIA INFUSION: 1.0000 mg | Freq: Four times a day (QID) | INTRAVENOUS | Status: DC | PRN

## 2012-01-11 MED ORDER — CEFAZOLIN SODIUM 1-5 GM-% IV SOLN
1.0000 g | INTRAVENOUS | Status: AC
  Administered 2012-01-11: 1 g via INTRAVENOUS

## 2012-01-11 MED ORDER — PROPOFOL 10 MG/ML IV BOLUS
INTRAVENOUS | Status: DC | PRN
  Administered 2012-01-11: 180 mg via INTRAVENOUS

## 2012-01-11 MED ORDER — BUPIVACAINE-EPINEPHRINE 0.5% -1:200000 IJ SOLN
INTRAMUSCULAR | Status: DC | PRN
  Administered 2012-01-11: 10 mL

## 2012-01-11 MED ORDER — RIVAROXABAN 10 MG PO TABS
10.0000 mg | ORAL_TABLET | Freq: Every day | ORAL | Status: DC
  Administered 2012-01-12 – 2012-01-15 (×4): 10 mg via ORAL
  Filled 2012-01-11 (×5): qty 1

## 2012-01-11 MED ORDER — ACETAMINOPHEN 650 MG RE SUPP: 650.0000 mg | Freq: Four times a day (QID) | RECTAL | Status: DC | PRN

## 2012-01-11 MED ORDER — ONDANSETRON HCL 4 MG PO TABS: 4.0000 mg | ORAL_TABLET | Freq: Four times a day (QID) | ORAL | Status: DC | PRN

## 2012-01-11 MED ORDER — NALOXONE HCL 0.4 MG/ML IJ SOLN: 0.4000 mg | INTRAMUSCULAR | Status: DC | PRN

## 2012-01-11 MED ORDER — ROCURONIUM BROMIDE 100 MG/10ML IV SOLN
INTRAVENOUS | Status: DC | PRN
  Administered 2012-01-11: 40 mg via INTRAVENOUS

## 2012-01-11 MED ORDER — SODIUM CHLORIDE 0.9 % IJ SOLN: 9.0000 mL | INTRAMUSCULAR | Status: DC | PRN

## 2012-01-11 MED ORDER — FENTANYL CITRATE 0.05 MG/ML IJ SOLN
INTRAMUSCULAR | Status: DC | PRN
  Administered 2012-01-11: 50 ug via INTRAVENOUS
  Administered 2012-01-11: 100 ug via INTRAVENOUS
  Administered 2012-01-11 (×3): 50 ug via INTRAVENOUS

## 2012-01-11 MED ORDER — MENTHOL 3 MG MT LOZG: 1.0000 | LOZENGE | OROMUCOSAL | Status: DC | PRN

## 2012-01-11 MED ORDER — PANTOPRAZOLE SODIUM 40 MG PO TBEC
40.0000 mg | DELAYED_RELEASE_TABLET | Freq: Every day | ORAL | Status: DC
  Administered 2012-01-12 – 2012-01-14 (×3): 40 mg via ORAL
  Filled 2012-01-11 (×4): qty 1

## 2012-01-11 MED ORDER — LIDOCAINE 5 % EX OINT: 1.0000 "application " | TOPICAL_OINTMENT | Freq: Three times a day (TID) | CUTANEOUS | Status: DC | PRN

## 2012-01-11 MED ORDER — LIDOCAINE 5 % EX PTCH
2.0000 | MEDICATED_PATCH | Freq: Every day | CUTANEOUS | Status: DC
  Administered 2012-01-13 – 2012-01-14 (×2): 2 via TRANSDERMAL
  Filled 2012-01-11 (×4): qty 2

## 2012-01-11 MED ORDER — MIDAZOLAM HCL 5 MG/5ML IJ SOLN
INTRAMUSCULAR | Status: DC | PRN
  Administered 2012-01-11: 2 mg via INTRAVENOUS

## 2012-01-11 MED ORDER — METOCLOPRAMIDE HCL 5 MG/ML IJ SOLN: 5.0000 mg | Freq: Three times a day (TID) | INTRAMUSCULAR | Status: DC | PRN

## 2012-01-11 MED ORDER — OXYMORPHONE HCL ER 20 MG PO TB12: 30.0000 mg | ORAL_TABLET | Freq: Two times a day (BID) | ORAL | Status: DC

## 2012-01-11 MED ORDER — DEXAMETHASONE SODIUM PHOSPHATE 4 MG/ML IJ SOLN
INTRAMUSCULAR | Status: DC | PRN
  Administered 2012-01-11: 10 mg via INTRAVENOUS

## 2012-01-11 MED ORDER — DIPHENHYDRAMINE HCL 12.5 MG/5ML PO ELIX: 12.5000 mg | ORAL_SOLUTION | ORAL | Status: DC | PRN

## 2012-01-11 MED ORDER — GLYCOPYRROLATE 0.2 MG/ML IJ SOLN
INTRAMUSCULAR | Status: DC | PRN
  Administered 2012-01-11: .6 mg via INTRAVENOUS

## 2012-01-11 MED ORDER — METOPROLOL TARTRATE 50 MG PO TABS
50.0000 mg | ORAL_TABLET | Freq: Every day | ORAL | Status: DC
  Administered 2012-01-12 – 2012-01-13 (×2): 50 mg via ORAL
  Filled 2012-01-11 (×3): qty 1

## 2012-01-11 MED ORDER — HYDROMORPHONE HCL PF 1 MG/ML IJ SOLN
0.2500 mg | INTRAMUSCULAR | Status: DC | PRN
  Administered 2012-01-11 (×2): 0.5 mg via INTRAVENOUS
  Administered 2012-01-11: 0.25 mg via INTRAVENOUS
  Administered 2012-01-11: 0.5 mg via INTRAVENOUS

## 2012-01-11 MED ORDER — METHOCARBAMOL 100 MG/ML IJ SOLN
500.0000 mg | Freq: Four times a day (QID) | INTRAVENOUS | Status: DC | PRN
  Administered 2012-01-11: 500 mg via INTRAVENOUS
  Filled 2012-01-11: qty 5

## 2012-01-11 MED ORDER — LACTATED RINGERS IV SOLN
INTRAVENOUS | Status: DC
  Administered 2012-01-11 (×2): via INTRAVENOUS
  Administered 2012-01-11: 1000 mL via INTRAVENOUS

## 2012-01-11 MED ORDER — ONDANSETRON HCL 4 MG/2ML IJ SOLN
INTRAMUSCULAR | Status: DC | PRN
  Administered 2012-01-11: 4 mg via INTRAVENOUS

## 2012-01-11 MED ORDER — SODIUM CHLORIDE 0.9 % IR SOLN
Status: DC | PRN
  Administered 2012-01-11: 08:00:00

## 2012-01-11 MED ORDER — KETOROLAC TROMETHAMINE 30 MG/ML IJ SOLN
30.0000 mg | Freq: Once | INTRAMUSCULAR | Status: AC
  Administered 2012-01-11: 30 mg via INTRAVENOUS

## 2012-01-11 MED ORDER — PHENOL 1.4 % MT LIQD: 1.0000 | OROMUCOSAL | Status: DC | PRN

## 2012-01-11 MED ORDER — ONDANSETRON HCL 4 MG/2ML IJ SOLN: 4.0000 mg | Freq: Four times a day (QID) | INTRAMUSCULAR | Status: DC | PRN

## 2012-01-11 MED ORDER — ROPIVACAINE HCL 5 MG/ML IJ SOLN
INTRAMUSCULAR | Status: DC | PRN
  Administered 2012-01-11: 30 mL

## 2012-01-11 MED ORDER — LIDOCAINE HCL (CARDIAC) 20 MG/ML IV SOLN
INTRAVENOUS | Status: DC | PRN
  Administered 2012-01-11: 100 mg via INTRAVENOUS

## 2012-01-11 MED ORDER — PROMETHAZINE HCL 25 MG/ML IJ SOLN: 6.2500 mg | INTRAMUSCULAR | Status: DC | PRN

## 2012-01-11 MED ORDER — CHLORHEXIDINE GLUCONATE 4 % EX LIQD: 60.0000 mL | Freq: Once | CUTANEOUS | Status: DC

## 2012-01-11 MED ORDER — DIPHENHYDRAMINE HCL 50 MG/ML IJ SOLN: 12.5000 mg | Freq: Four times a day (QID) | INTRAMUSCULAR | Status: DC | PRN

## 2012-01-11 MED ORDER — NEOSTIGMINE METHYLSULFATE 1 MG/ML IJ SOLN
INTRAMUSCULAR | Status: DC | PRN
  Administered 2012-01-11: 4 mg via INTRAVENOUS

## 2012-01-11 MED ORDER — OXYCODONE HCL 5 MG PO TABS
15.0000 mg | ORAL_TABLET | ORAL | Status: DC | PRN
  Administered 2012-01-11 – 2012-01-12 (×3): 15 mg via ORAL
  Filled 2012-01-11 (×4): qty 3

## 2012-01-11 MED ORDER — ACETAMINOPHEN 325 MG PO TABS
650.0000 mg | ORAL_TABLET | Freq: Four times a day (QID) | ORAL | Status: DC | PRN
  Administered 2012-01-12 (×2): 650 mg via ORAL
  Filled 2012-01-11 (×2): qty 2

## 2012-01-11 MED ORDER — CEFAZOLIN SODIUM 1-5 GM-% IV SOLN
1.0000 g | Freq: Four times a day (QID) | INTRAVENOUS | Status: AC
  Administered 2012-01-11 – 2012-01-12 (×3): 1 g via INTRAVENOUS
  Filled 2012-01-11 (×4): qty 50

## 2012-01-11 MED ORDER — LORAZEPAM 2 MG/ML IJ SOLN
1.0000 mg | Freq: Four times a day (QID) | INTRAMUSCULAR | Status: DC | PRN
  Administered 2012-01-12: 1 mg via INTRAVENOUS
  Filled 2012-01-11: qty 1

## 2012-01-11 MED ORDER — SODIUM CHLORIDE 0.9 % IR SOLN: Status: DC | PRN

## 2012-01-11 MED ORDER — METHOCARBAMOL 500 MG PO TABS
500.0000 mg | ORAL_TABLET | Freq: Four times a day (QID) | ORAL | Status: DC | PRN
  Administered 2012-01-11 – 2012-01-15 (×7): 500 mg via ORAL
  Filled 2012-01-11 (×7): qty 1

## 2012-01-11 MED ORDER — SODIUM CHLORIDE 0.9 % IV SOLN
INTRAVENOUS | Status: DC
  Administered 2012-01-11 – 2012-01-12 (×2): via INTRAVENOUS

## 2012-01-11 MED ORDER — HYDROMORPHONE 0.3 MG/ML IV SOLN
INTRAVENOUS | Status: DC
  Administered 2012-01-11: 1.8 mg via INTRAVENOUS
  Administered 2012-01-11: 1.83 mg via INTRAVENOUS
  Administered 2012-01-11 (×2): via INTRAVENOUS
  Administered 2012-01-12: 1.99 mg via INTRAVENOUS
  Administered 2012-01-12: 2.8 mg via INTRAVENOUS
  Administered 2012-01-12: 0.6 mg via INTRAVENOUS
  Administered 2012-01-12: 9.72 mg via INTRAVENOUS
  Administered 2012-01-12: 1.4 mg via INTRAVENOUS
  Administered 2012-01-12: 3.8 mg via INTRAVENOUS
  Administered 2012-01-12: 2.99 mg via INTRAVENOUS
  Administered 2012-01-13: 0.6 mg via INTRAVENOUS
  Filled 2012-01-11 (×3): qty 25

## 2012-01-11 MED ORDER — DOCUSATE SODIUM 100 MG PO CAPS
100.0000 mg | ORAL_CAPSULE | Freq: Two times a day (BID) | ORAL | Status: DC
  Administered 2012-01-11 – 2012-01-15 (×8): 100 mg via ORAL
  Filled 2012-01-11 (×9): qty 1

## 2012-01-11 MED ORDER — DIPHENHYDRAMINE HCL 12.5 MG/5ML PO ELIX
12.5000 mg | ORAL_SOLUTION | Freq: Four times a day (QID) | ORAL | Status: DC | PRN
  Filled 2012-01-11: qty 5

## 2012-01-11 MED ORDER — LACTATED RINGERS IV SOLN: INTRAVENOUS | Status: DC

## 2012-01-11 MED ORDER — ACETAMINOPHEN 10 MG/ML IV SOLN
INTRAVENOUS | Status: DC | PRN
  Administered 2012-01-11: 1000 mg via INTRAVENOUS

## 2012-01-11 SURGICAL SUPPLY — 59 items
BAG ZIPLOCK 12X15 (MISCELLANEOUS) ×2 IMPLANT
BANDAGE ELASTIC 4 VELCRO ST LF (GAUZE/BANDAGES/DRESSINGS) ×2 IMPLANT
BANDAGE ELASTIC 6 VELCRO ST LF (GAUZE/BANDAGES/DRESSINGS) ×2 IMPLANT
BANDAGE ESMARK 6X9 LF (GAUZE/BANDAGES/DRESSINGS) ×1 IMPLANT
BLADE SAG 18X100X1.27 (BLADE) ×2 IMPLANT
BLADE SAW SGTL 13.0X1.19X90.0M (BLADE) ×2 IMPLANT
BNDG COHESIVE 4X5 TAN NS LF (GAUZE/BANDAGES/DRESSINGS) ×2 IMPLANT
BNDG ESMARK 6X9 LF (GAUZE/BANDAGES/DRESSINGS) ×2
CEMENT HV SMART SET (Cement) ×4 IMPLANT
CHLORAPREP W/TINT 26ML (MISCELLANEOUS) IMPLANT
CLOTH BEACON ORANGE TIMEOUT ST (SAFETY) ×2 IMPLANT
CUFF TOURN SGL QUICK 34 (TOURNIQUET CUFF) ×1
CUFF TRNQT CYL 34X4X40X1 (TOURNIQUET CUFF) ×1 IMPLANT
DECANTER SPIKE VIAL GLASS SM (MISCELLANEOUS) ×2 IMPLANT
DRAPE LG THREE QUARTER DISP (DRAPES) ×2 IMPLANT
DRAPE ORTHO SPLIT 77X108 STRL (DRAPES) ×2
DRAPE POUCH INSTRU U-SHP 10X18 (DRAPES) ×2 IMPLANT
DRAPE SURG ORHT 6 SPLT 77X108 (DRAPES) ×2 IMPLANT
DRAPE U-SHAPE 47X51 STRL (DRAPES) ×2 IMPLANT
DRSG ADAPTIC 3X8 NADH LF (GAUZE/BANDAGES/DRESSINGS) ×2 IMPLANT
DRSG PAD ABDOMINAL 8X10 ST (GAUZE/BANDAGES/DRESSINGS) ×2 IMPLANT
DURAPREP 26ML APPLICATOR (WOUND CARE) ×2 IMPLANT
ELECT REM PT RETURN 9FT ADLT (ELECTROSURGICAL) ×2
ELECTRODE REM PT RTRN 9FT ADLT (ELECTROSURGICAL) ×1 IMPLANT
EVACUATOR 1/8 PVC DRAIN (DRAIN) ×2 IMPLANT
FACESHIELD LNG OPTICON STERILE (SAFETY) ×12 IMPLANT
GAUZE SPONGE 4X4 12PLY STRL LF (GAUZE/BANDAGES/DRESSINGS) ×2 IMPLANT
GLOVE BIO SURGEON STRL SZ 6.5 (GLOVE) ×2 IMPLANT
GLOVE BIOGEL PI IND STRL 6.5 (GLOVE) ×1 IMPLANT
GLOVE BIOGEL PI IND STRL 8 (GLOVE) ×1 IMPLANT
GLOVE BIOGEL PI INDICATOR 6.5 (GLOVE) ×1
GLOVE BIOGEL PI INDICATOR 8 (GLOVE) ×1
GLOVE ECLIPSE 6.5 STRL STRAW (GLOVE) ×2 IMPLANT
GLOVE SURG SS PI 8.0 STRL IVOR (GLOVE) ×4 IMPLANT
GOWN PREVENTION PLUS XLARGE (GOWN DISPOSABLE) ×2 IMPLANT
HANDPIECE INTERPULSE COAX TIP (DISPOSABLE) ×1
IMMOBILIZER KNEE 20 (SOFTGOODS) ×2
IMMOBILIZER KNEE 20 THIGH 36 (SOFTGOODS) ×1 IMPLANT
KIT BASIN OR (CUSTOM PROCEDURE TRAY) ×2 IMPLANT
MANIFOLD NEPTUNE II (INSTRUMENTS) ×2 IMPLANT
NS IRRIG 1000ML POUR BTL (IV SOLUTION) ×2 IMPLANT
PACK TOTAL JOINT (CUSTOM PROCEDURE TRAY) ×2 IMPLANT
PADDING CAST COTTON 6X4 STRL (CAST SUPPLIES) ×2 IMPLANT
POSITIONER SURGICAL ARM (MISCELLANEOUS) ×2 IMPLANT
SET HNDPC FAN SPRY TIP SCT (DISPOSABLE) ×1 IMPLANT
SPONGE SURGIFOAM ABS GEL 100 (HEMOSTASIS) ×2 IMPLANT
STAPLER VISISTAT (STAPLE) ×2 IMPLANT
SUCTION FRAZIER 12FR DISP (SUCTIONS) ×2 IMPLANT
SUT BONE WAX W31G (SUTURE) ×2 IMPLANT
SUT VIC AB 1 CT1 27 (SUTURE) ×4
SUT VIC AB 1 CT1 27XBRD ANTBC (SUTURE) ×4 IMPLANT
SUT VIC AB 2-0 CT1 27 (SUTURE) ×3
SUT VIC AB 2-0 CT1 TAPERPNT 27 (SUTURE) ×3 IMPLANT
SYR 30ML LL (SYRINGE) ×2 IMPLANT
TOWEL OR 17X26 10 PK STRL BLUE (TOWEL DISPOSABLE) ×2 IMPLANT
TOWER CARTRIDGE SMART MIX (DISPOSABLE) ×2 IMPLANT
TRAY FOLEY CATH 14FRSI W/METER (CATHETERS) ×2 IMPLANT
WATER STERILE IRR 1500ML POUR (IV SOLUTION) ×2 IMPLANT
WRAP KNEE MAXI GEL POST OP (GAUZE/BANDAGES/DRESSINGS) ×2 IMPLANT

## 2012-01-11 NOTE — Anesthesia Postprocedure Evaluation (Signed)
  Anesthesia Post-op Note  Patient: Chloe Young  Procedure(s) Performed:  TOTAL KNEE ARTHROPLASTY - General with Femoral nerve block  Patient Location: PACU  Anesthesia Type: GA combined with regional for post-op pain  Level of Consciousness: awake and alert   Airway and Oxygen Therapy: Patient Spontanous Breathing  Post-op Pain: mild  Post-op Assessment: Post-op Vital signs reviewed, Patient's Cardiovascular Status Stable, Respiratory Function Stable, Patent Airway and No signs of Nausea or vomiting  Post-op Vital Signs: stable  Complications: No apparent anesthesia complications

## 2012-01-11 NOTE — Transfer of Care (Signed)
Immediate Anesthesia Transfer of Care Note  Patient: Chloe Young  Procedure(s) Performed:  TOTAL KNEE ARTHROPLASTY - General with Femoral nerve block  Patient Location: PACU  Anesthesia Type: General  Level of Consciousness: awake, alert  and oriented  Airway & Oxygen Therapy: Patient Spontanous Breathing and Patient connected to face mask oxygen  Post-op Assessment: Report given to PACU RN and Post -op Vital signs reviewed and stable  Post vital signs: Reviewed and stable Filed Vitals:   01/11/12 0721  BP:   Pulse: 74  Temp:   Resp: 11    Complications: No apparent anesthesia complications

## 2012-01-11 NOTE — Interval H&P Note (Signed)
History and Physical Interval Note:  01/11/2012 7:47 AM  Chloe Young  has presented today for surgery, with the diagnosis of degenerative joint disease left knee  The various methods of treatment have been discussed with the patient and family. After consideration of risks, benefits and other options for treatment, the patient has consented to  Procedure(s): TOTAL KNEE ARTHROPLASTY as a surgical intervention .  The patients' history has been reviewed, patient examined, no change in status, stable for surgery.  I have reviewed the patients' chart and labs.  Questions were answered to the patient's satisfaction.     Maly Lemarr C

## 2012-01-11 NOTE — Anesthesia Procedure Notes (Signed)
Anesthesia Regional Block:  Femoral nerve block  Pre-Anesthetic Checklist: ,, timeout performed, Correct Patient, Correct Site, Correct Laterality, Correct Procedure, Correct Position, site marked, Risks and benefits discussed,  Surgical consent,  Pre-op evaluation,  At surgeon's request and post-op pain management  Laterality: Left  Prep: chloraprep       Needles:  Injection technique: Single-shot  Needle Type: Stimiplex     Needle Length:cm 4 cm Needle Gauge: 21 G    Additional Needles:  Procedures: ultrasound guided Femoral nerve block Narrative:  Start time: 01/11/2012 7:10 AM End time: 01/11/2012 7:20 AM Injection made incrementally with aspirations every 5 mL.  Performed by: Personally  Anesthesiologist: Gaetano Hawthorne MD  Additional Notes: Patient tolerated the procedure well without complications  Femoral nerve block

## 2012-01-11 NOTE — Anesthesia Preprocedure Evaluation (Addendum)
Anesthesia Evaluation  Patient identified by MRN, date of birth, ID band Patient awake    Reviewed: Allergy & Precautions, H&P , NPO status , Patient's Chart, lab work & pertinent test results, reviewed documented beta blocker date and time   Airway Mallampati: II TM Distance: >3 FB Neck ROM: full    Dental No notable dental hx. (+) Teeth Intact and Dental Advisory Given   Pulmonary neg pulmonary ROS,  clear to auscultation  Pulmonary exam normal       Cardiovascular Exercise Tolerance: Good hypertension, On Home Beta Blockers neg cardio ROS regular Normal    Neuro/Psych Hx. Cervical fusion Negative Neurological ROS  Negative Psych ROS   GI/Hepatic negative GI ROS, Neg liver ROS,   Endo/Other  Negative Endocrine ROS  Renal/GU negative Renal ROS  Genitourinary negative   Musculoskeletal   Abdominal   Peds  Hematology negative hematology ROS (+)   Anesthesia Other Findings   Reproductive/Obstetrics negative OB ROS                          Anesthesia Physical Anesthesia Plan  ASA: II  Anesthesia Plan: General   Post-op Pain Management:    Induction: Intravenous  Airway Management Planned: Oral ETT  Additional Equipment:   Intra-op Plan:   Post-operative Plan: Extubation in OR  Informed Consent: I have reviewed the patients History and Physical, chart, labs and discussed the procedure including the risks, benefits and alternatives for the proposed anesthesia with the patient or authorized representative who has indicated his/her understanding and acceptance.   Dental Advisory Given  Plan Discussed with: CRNA and Surgeon  Anesthesia Plan Comments:         Anesthesia Quick Evaluation

## 2012-01-11 NOTE — Brief Op Note (Signed)
01/11/2012  10:10 AM  PATIENT:  [name]  61 y.o. female  PRE-OPERATIVE DIAGNOSIS:  degenerative joint disease left knee  POST-OPERATIVE DIAGNOSIS:  degenerative joint disease left knee  PROCEDURE:  Procedure(s): TOTAL KNEE ARTHROPLASTY  SURGEON:  Surgeon(s): Javier Docker, MD  PHYSICIAN ASSISTANT:   ASSISTANTS: Strader   ANESTHESIA:   general  EBL:  Total I/O In: 1000 [I.V.:1000] Out: 250 [Urine:250]  BLOOD ADMINISTERED:none  DRAINS: (1) Hemovact drain(s) in the open with  Suction Open   LOCAL MEDICATIONS USED:  MARCAINE 20CC  SPECIMEN:  No Specimen  DISPOSITION OF SPECIMEN:  N/A  COUNTS:  YES  TOURNIQUET:   Total Tourniquet Time Documented: Thigh (Left) - 98 minutes  DICTATION: .Other Dictation: Dictation Number [id number]      PLAN OF CARE: Admit to inpatient   PATIENT DISPOSITION:  PACU - hemodynamically stable.   Delay start of Pharmacological VTE agent (>24hrs) due to surgical blood loss or risk of bleeding:  {YES/NO/NOT APPLICABLE:20182

## 2012-01-12 ENCOUNTER — Other Ambulatory Visit: Payer: Self-pay

## 2012-01-12 DIAGNOSIS — R000 Tachycardia, unspecified: Secondary | ICD-10-CM

## 2012-01-12 LAB — CARDIAC PANEL(CRET KIN+CKTOT+MB+TROPI)
CK, MB: 4.6 ng/mL — ABNORMAL HIGH (ref 0.3–4.0)
Relative Index: 1.2 (ref 0.0–2.5)
Total CK: 378 U/L — ABNORMAL HIGH (ref 7–177)
Total CK: 312 U/L — ABNORMAL HIGH (ref 7–177)
Troponin I: <0.3 ng/mL (ref <0.30)

## 2012-01-12 LAB — CBC
MCH: 31.5 pg (ref 26.0–34.0)
MCV: 91.3 fL (ref 78.0–100.0)
Platelets: 149 10*3/uL — ABNORMAL LOW (ref 150–400)
RBC: 3.81 MIL/uL — ABNORMAL LOW (ref 3.87–5.11)
RDW: 13.6 % (ref 11.5–15.5)

## 2012-01-12 LAB — BASIC METABOLIC PANEL
Calcium: 9 mg/dL (ref 8.4–10.5)
Creatinine, Ser: 0.57 mg/dL (ref 0.50–1.10)
GFR calc Af Amer: >90 mL/min (ref >90)
GFR calc non Af Amer: >90 mL/min (ref >90)
Sodium: 137 mEq/L (ref 135–145)

## 2012-01-12 MED ORDER — METOPROLOL TARTRATE 1 MG/ML IV SOLN
5.0000 mg | Freq: Once | INTRAVENOUS | Status: AC
  Administered 2012-01-12: 5 mg via INTRAVENOUS
  Filled 2012-01-12: qty 5

## 2012-01-12 MED ORDER — OXYCODONE HCL 5 MG PO TABS
15.0000 mg | ORAL_TABLET | ORAL | Status: DC | PRN
  Administered 2012-01-12 – 2012-01-15 (×15): 15 mg via ORAL
  Filled 2012-01-12 (×15): qty 3

## 2012-01-12 MED FILL — Oxymorphone HCl Tab ER 12HR 20 MG: ORAL | Qty: 1 | Status: AC

## 2012-01-12 MED FILL — Oxymorphone HCl Tab ER 12HR Deter 10 MG: ORAL | Qty: 1 | Status: AC

## 2012-01-12 NOTE — Progress Notes (Signed)
  Echocardiogram 2D Echocardiogram has been performed.  Cathie Beams Deneen 01/12/2012, 11:51 AM

## 2012-01-12 NOTE — Progress Notes (Signed)
Dr. Susie Cassette at bedside with patient. Patient has refused D-Dimer test. Will continue to monitor closely.

## 2012-01-12 NOTE — Progress Notes (Signed)
Subjective: 1 Day Post-Op Procedure(s) (LRB): TOTAL KNEE ARTHROPLASTY (Left) Patient reports pain as 5 on 0-10 scale.  Denies CP or SOB  Patient has complaints of pain as expected.  We will start therapy today. Plan is to go home after hospital stay. She did have episode of sinus tach overnight but appears to be stable now without any symptoms All labs thus far have been negative  Objective: Vital signs in last 24 hours: Temp:  [97.3 F (36.3 C)-99.7 F (37.6 C)] 99 F (37.2 C) (01/18 0550) Pulse Rate:  [65-133] 96  (01/18 0805) Resp:  [16-18] 16  (01/18 1209) BP: (133-175)/(85-102) 143/96 mmHg (01/18 0550) SpO2:  [95 %-100 %] 98 % (01/18 1209) Weight:  [66.044 kg (145 lb 9.6 oz)] 66.044 kg (145 lb 9.6 oz) (01/17 1425)  Intake/Output from previous day:  Intake/Output Summary (Last 24 hours) at 01/12/12 1229 Last data filed at 01/12/12 1030  Gross per 24 hour  Intake   1440 ml  Output   5330 ml  Net  -3890 ml    Intake/Output this shift: Total I/O In: 120 [P.O.:120] Out: 600 [Urine:600]  Labs: Results for orders placed during the hospital encounter of 01/11/12  CBC      Component Value Range   WBC 6.8  4.0 - 10.5 (K/uL)   RBC 3.81 (*) 3.87 - 5.11 (MIL/uL)   Hemoglobin 12.0  12.0 - 15.0 (g/dL)   HCT 62.9 (*) 52.8 - 46.0 (%)   MCV 91.3  78.0 - 100.0 (fL)   MCH 31.5  26.0 - 34.0 (pg)   MCHC 34.5  30.0 - 36.0 (g/dL)   RDW 41.3  24.4 - 01.0 (%)   Platelets 149 (*) 150 - 400 (K/uL)  BASIC METABOLIC PANEL      Component Value Range   Sodium 137  135 - 145 (mEq/L)   Potassium 4.1  3.5 - 5.1 (mEq/L)   Chloride 97  96 - 112 (mEq/L)   CO2 27  19 - 32 (mEq/L)   Glucose, Bld 101 (*) 70 - 99 (mg/dL)   BUN 8  6 - 23 (mg/dL)   Creatinine, Ser 2.72  0.50 - 1.10 (mg/dL)   Calcium 9.0  8.4 - 53.6 (mg/dL)   GFR calc non Af Amer >90  >90 (mL/min)   GFR calc Af Amer >90  >90 (mL/min)  CARDIAC PANEL(CRET KIN+CKTOT+MB+TROPI)      Component Value Range   Total CK 378 (*) 7 - 177  (U/L)   CK, MB 4.6 (*) 0.3 - 4.0 (ng/mL)   Troponin I <0.30  <0.30 (ng/mL)   Relative Index 1.2  0.0 - 2.5     Exam - Neurologically intact Sensation intact distally Compartment soft calf soft NT  Negative Homans Dressing - clean, dry Motor function intact - moving foot and toes well on exam.  Hemovac pulled without difficulty.  Assessment/Plan: 1 Day Post-Op Procedure(s) (LRB): TOTAL KNEE ARTHROPLASTY (Left)  Up with therapy Past Medical History  Diagnosis Date  . Cervical post-laminectomy syndrome   . Cervical facet syndrome   . Cervicalgia   . Chronic pain syndrome   . Osteoarthritis, localized, knee     OA AND PAIN LEFT KNEE  -- S/P RIGHT  KNEE ARTHROPLASTY ON 82012--KNEE IS DOING WELL.    DVT Prophylaxis - Xarelto Protocol Weight-Bearing as tolerated to left leg Will cont to monitor heart rate currently stable  Will wean from PCA to po meds today likely dc PCA tomorrow No vaccines. D/c  likely Monday Arianie Couse R. 01/12/2012, 12:29 PM

## 2012-01-12 NOTE — Consult Note (Addendum)
Chloe Young MRN: [id number] DOB/AGE: April 02, 1951 61 y.o. Primary Care Physician:William Alwyn Ren, MD, MD Admit date: 01/11/2012 Chief Complaint: Tachycardia   HPI:61 year old with end-stage osteoarthrosis of the knee,  history of arthroscopic debridement with persistent pain, grade 4  lesions of the femoral condyle.  She had left total knee arthroplasty on 01/11/2012. Postoperatively she was found to be tachycardic with heart rate in the 130s. EKG showed sinus tachycardia. She denies any significant cardiac history. She denies having any chest pain shortness of breath palpitations lightheadedness prior to the surgery. She was evaluated by Dr. Grandville Silos prior to her surgery .  Past Medical History  Diagnosis Date  . Cervical post-laminectomy syndrome   . Cervical facet syndrome   . Cervicalgia   . Chronic pain syndrome   . Osteoarthritis, localized, knee     OA AND PAIN LEFT KNEE  -- S/P RIGHT  KNEE ARTHROPLASTY ON 82012--KNEE IS DOING WELL.    Past Surgical History  Procedure Date  . Cervial fusion 2006  . Knee arthroscopy     BILATERAL  . Joint replacement AUG 2012     RIGHT KNEE ARTHROPLASTY    Prior to Admission medications   Medication Sig Start Date End Date Taking? Authorizing Provider  Calcium Carbonate-Vitamin D (CALCIUM + D PO) Take 1 tablet by mouth daily.    Yes Historical Provider, MD  cyclobenzaprine (AMRIX) 15 MG 24 hr capsule Take 15 mg by mouth daily as needed. Muscle spasm    Yes Historical Provider, MD  lidocaine (XYLOCAINE) 5 % ointment Apply 1 application topically 3 (three) times daily as needed.    Yes Historical Provider, MD  meloxicam (MOBIC) 15 MG tablet Take 15 mg by mouth daily.   Yes Ranelle Oyster, MD  metoprolol (LOPRESSOR) 50 MG tablet Take 50 mg by mouth daily with breakfast.    Yes Historical Provider, MD  Multiple Vitamin (MULITIVITAMIN WITH MINERALS) TABS Take 1 tablet by mouth daily.    Yes Historical Provider, MD  omeprazole (PRILOSEC) 20  MG capsule Take 20 mg by mouth daily.   Yes Historical Provider, MD  oxyCODONE (ROXICODONE) 15 MG immediate release tablet Take 15 mg by mouth every 8 (eight) hours as needed. Pain    Yes Historical Provider, MD  oxymorphone (OPANA ER) 30 MG 12 hr tablet Take 30 mg by mouth every 12 (twelve) hours.    Yes Historical Provider, MD  lidocaine (LIDODERM) 5 % Place 2 patches onto the skin daily. Remove & Discard patch within 12 hours or as directed by MD    Ranelle Oyster, MD    Allergies: No Known Allergies    Family History:  Father significant for cancer. Mother significant for  fissures.  Social History:  The patient is married. She is a Interior and spatial designer. History  of tobacco. She does drink occasional alcohol. She plans to go home  following her surgery. She has been in pain management with Dr. Cato Mulligan.  Primary care physician Dr. Alwyn Ren.    Review of Systems complete 14 point review of systems was done and pertinent positives documented in history of present illness General: No chills, fevers, night sweats, fatigue.  Neuro: No seizures, syncope, paralysis, visual problems.  Respiratory: No shortness of breath, productive cough, hemoptysis.  Cardiovascular: No chest pain, angina, palpitations, orthopnea.  GI: No nausea, vomiting, diarrhea, constipation.  GU: No dysuria, hematuria, discharge.  Musculoskeletal: Joint pain     PHYSICAL EXAM: Blood pressure 143/96, pulse 96, temperature 99 F (37.2 C),  temperature source Oral, resp. rate 18, height 5\' 3"  (1.6 m), weight 66.044 kg (145 lb 9.6 oz), SpO2 96.00%. GENERAL: This is a thin female who walks with antalgic gait, slightly  anxious in nature.  HEENT: Atraumatic, normocephalic. Pupils equal, round, reactive to  light.  NECK: Supple. No lymphadenopathy.  CHEST: Clear to auscultation bilaterally.  Heart: Regular rate and rhythm without murmurs, gallops, or rubs.  ABDOMEN: Soft, nontender, nondistended. Bowel sounds x4.  SKIN: No  rashes or lesions are noted in regard the knee. The patient  does have mild effusion. She is tender along medial lateral joint line.  There she does have a valgus deformity. Range of motion is -3 to 120  degrees, mild instability with stressing.    Recent Results (from the past 240 hour(s))  SURGICAL PCR SCREEN     Status: Normal   Collection Time   01/08/12 11:50 AM      Component Value Range Status Comment   MRSA, PCR NEGATIVE  NEGATIVE  Final    Staphylococcus aureus NEGATIVE  NEGATIVE  Final      Results for orders placed during the hospital encounter of 01/11/12 (from the past 48 hour(s))  CBC     Status: Abnormal   Collection Time   01/12/12  4:20 AM      Component Value Range Comment   WBC 6.8  4.0 - 10.5 (K/uL)    RBC 3.81 (*) 3.87 - 5.11 (MIL/uL)    Hemoglobin 12.0  12.0 - 15.0 (g/dL)    HCT 16.1 (*) 09.6 - 46.0 (%)    MCV 91.3  78.0 - 100.0 (fL)    MCH 31.5  26.0 - 34.0 (pg)    MCHC 34.5  30.0 - 36.0 (g/dL)    RDW 04.5  40.9 - 81.1 (%)    Platelets 149 (*) 150 - 400 (K/uL)   BASIC METABOLIC PANEL     Status: Abnormal   Collection Time   01/12/12  4:20 AM      Component Value Range Comment   Sodium 137  135 - 145 (mEq/L)    Potassium 4.1  3.5 - 5.1 (mEq/L)    Chloride 97  96 - 112 (mEq/L)    CO2 27  19 - 32 (mEq/L)    Glucose, Bld 101 (*) 70 - 99 (mg/dL)    BUN 8  6 - 23 (mg/dL)    Creatinine, Ser 9.14  0.50 - 1.10 (mg/dL)    Calcium 9.0  8.4 - 10.5 (mg/dL)    GFR calc non Af Amer >90  >90 (mL/min)    GFR calc Af Amer >90  >90 (mL/min)   CARDIAC PANEL(CRET KIN+CKTOT+MB+TROPI)     Status: Abnormal   Collection Time   01/12/12  4:20 AM      Component Value Range Comment   Total CK 378 (*) 7 - 177 (U/L)    CK, MB 4.6 (*) 0.3 - 4.0 (ng/mL)    Troponin I <0.30  <0.30 (ng/mL)    Relative Index 1.2  0.0 - 2.5      Dg Knee 2 Views Left  01/08/2012  *RADIOLOGY REPORT*  Clinical Data: Preoperative exam for knee arthroplasty  LEFT KNEE - 1-2 VIEW  Comparison: MRI  11/08/2010  Findings: There is a moderate joint effusion.  There is joint space narrowing of the lateral compartment patellofemoral joint.  No other focal finding.  IMPRESSION: Moderate joint effusion.  Patellofemoral and lateral compartment osteoarthritis.  Original Report Authenticated By: Loraine Leriche  [facility], M.D.   X-ray Knee Left Port  01/11/2012  *RADIOLOGY REPORT*  Clinical Data: Post arthroscopy  PORTABLE LEFT KNEE - 1-2 VIEW  Comparison: 01/08/2012  Findings: There is a drainage catheter which overlies the knee.  Soft tissue gas compatible with recent arthroscopy.  Prior left knee arthroplasty.  No evidence for dislocation or periprosthetic fracture.  IMPRESSION:  1.  No apparent complications status post left knee arthroplasty.  Original Report Authenticated By: Rosealee Albee, M.D.    Impression:  61 year old female status post left knee arthroplasty who developed sinus tachycardia post surgery  This is likely rebound tachycardia, from a single dose of a beta blocker. It is also pertinent to rule out DVT and PE  Plan: #1 cycle cardiac enzymes #2 continue beta blocker #3 check thyroid function #4 2-D echo  #5 start d-dimer, if positive CT angiogram chest to rule out PE   Patient refused to get a d-dimer done yesterday, she also refused to let us check her cardiac enzymes   Alam Guterrez 01/12/2012, 9:59 AM

## 2012-01-12 NOTE — Progress Notes (Signed)
Physical Therapy Treatment Patient Details Name: Chloe Young MRN: [id number] DOB: 05-05-1951 Today's Date: 01/12/2012  [id number] 2TE, TA  PT Assessment/Plan  PT - Assessment/Plan Comments on Treatment Session: Pt requires increased assist from therapist to complete exercises due to increase pain with movement.  continue to recommend HHPT at D/C. PT Plan: Discharge plan remains appropriate PT Frequency: 7X/week Follow Up Recommendations: Home health PT Equipment Recommended: None recommended by PT PT Goals  Acute Rehab PT Goals PT Goal Formulation: With patient Time For Goal Achievement: 7 days Pt will go Sit to Supine/Side: with min assist PT Goal: Sit to Supine/Side - Progress: Progressing toward goal Pt will go Sit to Stand: with min assist PT Goal: Sit to Stand - Progress: Progressing toward goal Pt will go Stand to Sit: with supervision PT Goal: Stand to Sit - Progress: Progressing toward goal Pt will Perform Home Exercise Program: with supervision, verbal cues required/provided PT Goal: Perform Home Exercise Program - Progress: Progressing toward goal  PT Treatment Precautions/Restrictions  Precautions Precautions: Knee Required Braces or Orthoses: Yes Knee Immobilizer: Discontinue once straight leg raise with < 10 degree lag Restrictions Weight Bearing Restrictions: Yes LLE Weight Bearing: Weight bearing as tolerated Mobility (including Balance) Bed Mobility Bed Mobility: Yes Supine to Sit: 3: Mod assist Supine to Sit Details (indicate cue type and reason): Requires assist with LE with cues for hand placement to assist trunk.  Transfers Transfers: Yes Stand Pivot Transfers: 3: Mod assist Stand Pivot Transfer Details (indicate cue type and reason): Stand pivot transfer from bed to 3in1 with cues for hand placement and sequencing.     Exercise  Total Joint Exercises Ankle Circles/Pumps: AROM;Both;20 reps;Supine Quad Sets: AROM;Left;10 reps;Supine Short Arc  Quad: AAROM;Left;10 reps;Supine Heel Slides: AAROM;Left;10 reps;Supine Hip ABduction/ADduction: AAROM;Left;10 reps;Supine Straight Leg Raises: AAROM;Left;10 reps;Supine End of Session PT - End of Session Equipment Utilized During Treatment: Left knee immobilizer Activity Tolerance: Patient tolerated treatment well Patient left: in bed;with call bell in reach General Behavior During Session: [facility] for tasks performed Cognition: [facility] for tasks performed  Young, Chloe Mattes 01/12/2012, 4:21 PM

## 2012-01-12 NOTE — Progress Notes (Signed)
During Q4H post-op vital check patient was tachycardia.  She denies chest pain or shortness of breath.  Reports moderate pain.  PA on call Oneida Alar paged at 0250.  Order obtained to perform ECG.  ECG performed at 0305.  Oneida Alar PA paged with results, ordered to get cardiac enzymes and have AM labs drawn early.  Ordered to give AM dose of Lopressor now.  Will continue to monitor.

## 2012-01-12 NOTE — Progress Notes (Signed)
Called Oneida Alar PA with the results of the cardiac panel and the follow up vital signs after the patient received the early dose of PO lopressor.  He consulted the hospital medical physician.  Patient reports no chest pain or shortness of breath and only moderate pain.  Will continue to monitor.

## 2012-01-12 NOTE — Op Note (Signed)
NAMETESSLA, SPURLING                 ACCOUNT NO.:  [id number]  MEDICAL RECORD NO.:  [id number]  LOCATION:  1619                         FACILITY:  [facility]  PHYSICIAN:  Jene Every, M.D.    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  PROCEDURE PERFORMED:  Left total knee arthroplasty.  ANESTHESIA:  General.  ASSISTANT:  Roma Schanz, PA.  COMPONENTS:  DePuy rotating platform, 2.5 femur, 2.5 tibia, 10 mm insert, 35 patella.  HISTORY:  A 61 year old with end-stage osteoarthrosis of the knee, history of arthroscopic debridement with persistent pain, grade 4 lesions of the femoral condyle.  With standing he has significant valgus deformity, ambulatory dysfunction, bone-on-bone arthritis.  He was indicated for replacement of the degenerated joint.  Risk and benefits were discussed including bleeding, infection, damage to neurovascular structures, no change in symptoms, worsening symptoms, need for repeat manipulation, DVT, PE, anesthetic complication etc.  TECHNIQUE:  With the patient in supine position, after induction of adequate general anesthesia, 2 g Kefzol, left lower extremity was prepped and draped in the usual sterile fashion.  Thigh tourniquet was inflated to 300 mmHg.  A midline incision was made.  Full thickness flaps were developed.  Median and parapatellar arthrotomy was performed, valgus deformity was noted.  Minimal soft tissue elevation occurred medially.  Patella was everted, knee flexed.  Tricompartmental osteoporosis was noted.  Clear synovial fluid evacuated.  Removed the remnants of the medial and lateral menisci and the ACL.  Step drill was utilized to enter the femoral canal, it was  irrigated.  T-handle reamer placed, 5 degree left.  I took 10 off the distal femur, note she had a flexion contracture to the valgus deformity.  Soft tissue was protected. We then measured off the anterior distal femur off the anterior cortex, at 2.5.  This was then pinned and chamfer guide placed.  Anterior and posterior chamfer cuts were then performed.  Attention was turned towards the tibia with the knee flexed __________ was placed.  External alignment guide placed, 10 off the high side, which was medial, bisecting the ankle joint parallel to the tibia.  This was then pinned. An oscillating saw was utilized to perform the tibial cut.  We then tried our flexion extension gap block and it appeared to be symmetric. Therefore, we completed preparation of the tibia.  __________ was placed.  Knee flexed to 5 size, maximally covering this specifically medially.  3 was too large, 2.5 was appropriate just to the medial aspect of the tibial tubercle.  This was pinned, centrally drilled, and a punch guide utilized.  Attention turned towards completion of the femur.  Box cut was then performed, bisecting the canal.  We then put a trial femur and tibia as well as a 10 mm insert.  We had some tightness in extension.  Therefore, attention was turned back towards the femur. We had 2 successive cuts of the femur to achieve the  adequate balancing that was optimal.  In terms of full extension, we removed an additional 3 mm off the distal femur with the anterior and posterior chamfer cuts to be done as well as the box cut as well.  After this, we placed the trials and we had full extension, full flexion, good stability to varus valgus stressing at 0-30 degrees.  Good patellofemoral tracking was noted.  Then, all instrumentation was removed.  We checked posteriorly, removed any remnants of the menisci, geniculate vessels were cauterized. Any osteophytes were removed as well.  Pulsatile lavage was utilized  to clean the bony surfaces.  A sterile towel was then applied.  All surfaces dried, flexed. Attention was turned towards the tibia.  The cement mixed on the back table in appropriate fashion, injected into the tibial canal and digitally pressurized.  I impacted a 2.5 permanent tibial component with maximal coverage and 2.5 femur.  Inserted the trial 10 and reduced it, cemented the 35 patella.  We then prepared the patella by measuring the thickness to a 22 and we set it at a 15 or 15 and 14.  The peg holes were drilled to medialize the patella as well. The patella was trialed with good patellofemoral tracking just prior to her permanent prosthesis placed.  We cemented the patella and clamped it as well, placed in extension with an axial load applied throughout the curing of the cement.  Redundant cement was removed after the cement was cured meticulously.  We irrigated it, checked posteriorly and anteriorly.  Placed a 10 mm permanent insert after copious irrigation, full extension, full flexion, good stability to varus valgus stressing 0- 30 degrees, and a good patellofemoral tracking.  We then placed a Hemovac, brought out through a lateral stab wound in the skin.  Repaired the patellar arthrotomy in slight flexion with #1 Vicryl in figure-of- eight sutures, subcutaneous with 0 and 2-0 Vicryl simple sutures.  Skin was reapproximated with staples.  She had flexion to 90 degrees against gravity and had good patellofemoral tracking.  No anterior drawer.  Good stability to varus valgus stressing 0-30 degrees.  Sterile dressing was applied.  Tourniquet was deflated with adequate vascularization of lower extremity appreciated. The patient tolerated the procedure well.  No complications.  Assistant was [employer].  Tourniquet time was 98 minutes.     Jene Every, M.D.     Cordelia Pen  D:  01/11/2012  T:  01/12/2012  Job:  [id number]

## 2012-01-12 NOTE — Progress Notes (Signed)
Physical Therapy Evaluation Patient Details Name: Chloe Young MRN: [id number] DOB: 03/04/1951 Today's Date: 01/12/2012  [id number] EVII  Problem List:  Patient Active Problem List  Diagnoses  . HYPERLIPIDEMIA  . HYPERTENSION  . RAYNAUD'S SYNDROME  . JOINT EFFUSION, LEFT KNEE  . KNEE PAIN, LEFT  . DISC DISEASE, CERVICAL  . NECK PAIN, CHRONIC  . NONSPEC ELEVATION OF LEVELS OF TRANSAMINASE/LDH    Past Medical History:  Past Medical History  Diagnosis Date  . Cervical post-laminectomy syndrome   . Cervical facet syndrome   . Cervicalgia   . Chronic pain syndrome   . Osteoarthritis, localized, knee     OA AND PAIN LEFT KNEE  -- S/P RIGHT  KNEE ARTHROPLASTY ON 82012--KNEE IS DOING WELL.   Past Surgical History:  Past Surgical History  Procedure Date  . Cervial fusion 2006  . Knee arthroscopy     BILATERAL  . Joint replacement AUG 2012     RIGHT KNEE ARTHROPLASTY    PT Assessment/Plan/Recommendation PT Assessment Clinical Impression Statement: Pt presents s/p L TKR with decreased strength and ROM.  Tolerated 20' of ambulation with +2 assist for safety/chair follow.  Pt would benefit from PT in the acute venue to address deficits to prepare for HHPT at D/C.   PT Recommendation/Assessment: Patient will need skilled PT in the acute care venue PT Problem List: Decreased strength;Decreased range of motion;Decreased activity tolerance;Decreased mobility;Decreased knowledge of use of DME;Pain Barriers to Discharge: None PT Therapy Diagnosis : Difficulty walking;Abnormality of gait;Generalized weakness;Acute pain PT Plan PT Frequency: 7X/week PT Treatment/Interventions: DME instruction;Gait training;Stair training;Functional mobility training;Therapeutic activities;Therapeutic exercise;Patient/family education PT Recommendation Follow Up Recommendations: Home health PT Equipment Recommended: None recommended by PT PT Goals  Acute Rehab PT Goals PT Goal Formulation: With  patient Time For Goal Achievement: 7 days Pt will go Supine/Side to Sit: with min assist PT Goal: Supine/Side to Sit - Progress: Goal set today Pt will go Sit to Supine/Side: with min assist PT Goal: Sit to Supine/Side - Progress: Goal set today Pt will go Sit to Stand: with min assist PT Goal: Sit to Stand - Progress: Goal set today Pt will go Stand to Sit: with supervision PT Goal: Stand to Sit - Progress: Goal set today Pt will Ambulate: 51 - 150 feet;with supervision;with least restrictive assistive device PT Goal: Ambulate - Progress: Goal set today Pt will Go Up / Down Stairs: 3-5 stairs;with min assist;with least restrictive assistive device PT Goal: Up/Down Stairs - Progress: Goal set today Pt will Perform Home Exercise Program: with supervision, verbal cues required/provided PT Goal: Perform Home Exercise Program - Progress: Goal set today  PT Evaluation Precautions/Restrictions  Precautions Precautions: Knee Required Braces or Orthoses: Yes Knee Immobilizer: Discontinue once straight leg raise with < 10 degree lag Restrictions Weight Bearing Restrictions: Yes LLE Weight Bearing: Weight bearing as tolerated Prior Functioning  Home Living Lives With: Spouse Receives Help From: Family Type of Home: House Home Layout: One level Home Access: Stairs to enter Entrance Stairs-Rails: Right Entrance Stairs-Number of Steps: 5 Home Adaptive Equipment: Walker - rolling;Crutches;Bedside commode/3-in-1 Prior Function Level of Independence: Independent with basic ADLs;Independent with gait;Independent with transfers Driving: Yes Vocation: Full time employment Cognition Cognition Arousal/Alertness: Awake/alert Overall Cognitive Status: Appears within functional limits for tasks assessed Orientation Level: Oriented X4 Sensation/Coordination Sensation Light Touch: Appears Intact Coordination Gross Motor Movements are Fluid and Coordinated: Yes Extremity Assessment RLE  Assessment RLE Assessment: Within Functional Limits LLE Assessment LLE Assessment: Exceptions to [facility] LLE Strength  LLE Overall Strength Comments: ankle motions WFL, unable to perform SLR Mobility (including Balance) Bed Mobility Bed Mobility: Yes Supine to Sit: 1: +2 Total assist Supine to Sit Details (indicate cue type and reason): Pt assist 80%, +2 for safety with assist for LLE and cues for hand placement to assist trunk into sitting position.  Transfers Transfers: Yes Sit to Stand: 1: +2 Total assist;With upper extremity assist;From bed Sit to Stand Details (indicate cue type and reason): Pt assist 50% with cues for LLE management and for hand placement on bed before standing. Stand to Sit: 4: Min assist;3: Mod assist;With upper extremity assist;With armrests;To chair/3-in-1 Stand to Sit Details: Required cues for LLE management and hand placement with min/mod assist for controlled descent to chair.  Ambulation/Gait Ambulation/Gait: Yes Ambulation/Gait Assistance: 1: +2 Total assist Ambulation/Gait Assistance Details (indicate cue type and reason): Pt assist 70% with cues for sequencing/technique with RW and for upright posture.   Ambulation Distance (Feet): 20 Feet Assistive device: Rolling walker Gait Pattern: Step-to pattern;Trunk flexed;Decreased stride length Gait velocity: decreased    Exercise    End of Session PT - End of Session Equipment Utilized During Treatment: Gait belt;Left knee immobilizer Activity Tolerance: Patient tolerated treatment well Patient left: in chair;with call bell in reach Nurse Communication: Mobility status for transfers;Mobility status for ambulation General Behavior During Session: [facility] for tasks performed Cognition: [facility] for tasks performed  Page, Meribeth Mattes 01/12/2012, 11:35 AM

## 2012-01-12 NOTE — Progress Notes (Signed)
  CARE MANAGEMENT NOTE 01/12/2012  Patient:  Chloe Young, Chloe Young   Account Number:  [id number]  Date Initiated:  01/12/2012  Documentation initiated by:  Colleen Can  Subjective/Objective Assessment:   dx degenerative joint disease of knee-left; total knee replacemnt     Action/Plan:   Pt palns to go home with spoiuse as caregiver. Already has DME. Wants Chloe Young for [facility] services.   Anticipated DC Date:  01/14/2012   Anticipated DC Plan:  HOME W HOME HEALTH SERVICES  In-house referral  NA      DC Planning Services  CM consult      [facility] Choice  HOME HEALTH   Choice offered to / List presented to:  [name]   DME arranged  NA      DME agency  NA     HH arranged  HH-2 PT      [facility] agency  [facility]   Status of service:  In process, will continue to follow Medicare Important Message given?  NA - LOS <3 / Initial given by admissions (If response is "NO", the following Medicare IM given date fields will be blank) Comments:  01/14/2012 Chloe Young BSN CCM [phone] List of [facility] agencies for home health choice placed in shadow chart.

## 2012-01-13 ENCOUNTER — Inpatient Hospital Stay (HOSPITAL_COMMUNITY): Payer: BC Managed Care – PPO

## 2012-01-13 LAB — BASIC METABOLIC PANEL
GFR calc Af Amer: >90 mL/min (ref >90)
GFR calc non Af Amer: >90 mL/min (ref >90)
Potassium: 3.7 mEq/L (ref 3.5–5.1)
Sodium: 129 mEq/L — ABNORMAL LOW (ref 135–145)

## 2012-01-13 LAB — CBC
MCHC: 36.1 g/dL — ABNORMAL HIGH (ref 30.0–36.0)
RDW: 13.2 % (ref 11.5–15.5)

## 2012-01-13 MED ORDER — IOHEXOL 300 MG/ML  SOLN
100.0000 mL | Freq: Once | INTRAMUSCULAR | Status: AC | PRN
  Administered 2012-01-13: 100 mL via INTRAVENOUS

## 2012-01-13 MED ORDER — METOPROLOL SUCCINATE ER 50 MG PO TB24
50.0000 mg | ORAL_TABLET | Freq: Every day | ORAL | Status: DC
  Administered 2012-01-13 – 2012-01-14 (×2): 50 mg via ORAL
  Filled 2012-01-13 (×4): qty 1

## 2012-01-13 MED ORDER — BISACODYL 10 MG RE SUPP: 10.0000 mg | Freq: Once | RECTAL | Status: DC

## 2012-01-13 MED ORDER — BISACODYL 5 MG PO TBEC
5.0000 mg | DELAYED_RELEASE_TABLET | Freq: Every day | ORAL | Status: DC | PRN
  Administered 2012-01-14: 5 mg via ORAL
  Filled 2012-01-13: qty 1

## 2012-01-13 NOTE — Progress Notes (Signed)
Physical Therapy Treatment Patient Details Name: Chloe Young MRN: [id number] DOB: 12-31-1950 Today's Date: 01/13/2012 [id number] 2GT PT Assessment/Plan  PT - Assessment/Plan Comments on Treatment Session: Patient educated on use of KI.  Feels she didn't use it as much as she should have after right TKA.  Wants to wait to practice steps in am. PT Plan: Discharge plan remains appropriate PT Frequency: 7X/week Follow Up Recommendations: Home health PT Equipment Recommended: None recommended by PT PT Goals  Acute Rehab PT Goals Pt will go Supine/Side to Sit: with min assist PT Goal: Supine/Side to Sit - Progress: Met Pt will go Sit to Stand: with supervision PT Goal: Sit to Stand - Progress: Updated due to goal met Pt will go Stand to Sit: with supervision PT Goal: Stand to Sit - Progress: Progressing toward goal Pt will Ambulate: 51 - 150 feet;with least restrictive assistive device;with supervision PT Goal: Ambulate - Progress: Progressing toward goal Pt will Perform Home Exercise Program: with supervision, verbal cues required/provided PT Goal: Perform Home Exercise Program - Progress: Progressing toward goal  PT Treatment Precautions/Restrictions  Precautions Precautions: Knee Required Braces or Orthoses: Yes Knee Immobilizer: Discontinue once straight leg raise with < 10 degree lag;On when out of bed or walking Restrictions Weight Bearing Restrictions: Yes LLE Weight Bearing: Weight bearing as tolerated Mobility (including Balance) Bed Mobility Supine to Sit: 4: Min assist Supine to Sit Details (indicate cue type and reason): for left LE Sit to Supine: 4: Min assist Sit to Supine - Details (indicate cue type and reason): for left LE Transfers Sit to Stand: 4: Min assist;From bed;From chair/3-in-1;With upper extremity assist Sit to Stand Details (indicate cue type and reason): assist for left LE Stand to Sit: 4: Min assist;With upper extremity assist;To chair/3-in-1 Stand  to Sit Details: cues for left leg out Ambulation/Gait Ambulation/Gait: Yes Ambulation/Gait Assistance: 5: Supervision;4: Min assist Ambulation/Gait Assistance Details (indicate cue type and reason): minguard assist to supervision.  Vaults on right to initiate swing on left.  Cued to try with right foot flat Ambulation Distance (Feet): 150 Feet Assistive device: Rolling walker Gait Pattern: Step-to pattern;Decreased stride length;Antalgic    Exercise   End of Session PT - End of Session Equipment Utilized During Treatment: Gait belt Activity Tolerance: Patient tolerated treatment well Patient left: in chair;with call bell in reach (awaiting transport to CT) General Behavior During Session: [facility] for tasks performed Cognition: [facility] for tasks performed  [facility] 01/13/2012, 3:01 PM

## 2012-01-13 NOTE — Progress Notes (Signed)
Subjective: Remains tachycardic heart rate in the 130s  Objective: Vital signs in last 24 hours: Filed Vitals:   01/13/12 0022 01/13/12 0400 01/13/12 0446 01/13/12 0749  BP: 142/92  151/88   Pulse: 105  128 118  Temp:   98.9 F (37.2 C)   TempSrc:   Oral   Resp:  16 14   Height:      Weight:      SpO2: 96% 95% 99%     Intake/Output Summary (Last 24 hours) at 01/13/12 1426 Last data filed at 01/13/12 1320  Gross per 24 hour  Intake 2954.33 ml  Output   1700 ml  Net 1254.33 ml    Weight change:   GENERAL: This is a thin female who walks with antalgic gait, slightly  anxious in nature.  HEENT: Atraumatic, normocephalic. Pupils equal, round, reactive to  light.  NECK: Supple. No lymphadenopathy.  CHEST: Clear to auscultation bilaterally.  Heart: Regular rate and rhythm without murmurs, gallops, or rubs.  ABDOMEN: Soft, nontender, nondistended. Bowel sounds x4.  SKIN: No rashes or lesions are noted in regard the knee   Lab Results: Results for orders placed during the hospital encounter of 01/11/12 (from the past 24 hour(s))  CBC     Status: Abnormal   Collection Time   01/13/12  5:10 AM      Component Value Range   WBC 8.2  4.0 - 10.5 (K/uL)   RBC 3.98  3.87 - 5.11 (MIL/uL)   Hemoglobin 12.7  12.0 - 15.0 (g/dL)   HCT 40.9 (*) 81.1 - 46.0 (%)   MCV 88.4  78.0 - 100.0 (fL)   MCH 31.9  26.0 - 34.0 (pg)   MCHC 36.1 (*) 30.0 - 36.0 (g/dL)   RDW 91.4  78.2 - 95.6 (%)   Platelets 170  150 - 400 (K/uL)  BASIC METABOLIC PANEL     Status: Abnormal   Collection Time   01/13/12  5:10 AM      Component Value Range   Sodium 129 (*) 135 - 145 (mEq/L)   Potassium 3.7  3.5 - 5.1 (mEq/L)   Chloride 94 (*) 96 - 112 (mEq/L)   CO2 23  19 - 32 (mEq/L)   Glucose, Bld 122 (*) 70 - 99 (mg/dL)   BUN 7  6 - 23 (mg/dL)   Creatinine, Ser 2.13  0.50 - 1.10 (mg/dL)   Calcium 9.2  8.4 - 08.6 (mg/dL)   GFR calc non Af Amer >90  >90 (mL/min)   GFR calc Af Amer >90  >90 (mL/min)      Micro: Recent Results (from the past 240 hour(s))  SURGICAL PCR SCREEN     Status: Normal   Collection Time   01/08/12 11:50 AM      Component Value Range Status Comment   MRSA, PCR NEGATIVE  NEGATIVE  Final    Staphylococcus aureus NEGATIVE  NEGATIVE  Final     Studies/Results: No results found.  Medications:  Scheduled Meds:   . bisacodyl  10 mg Rectal Once  . docusate sodium  100 mg Oral BID  . lidocaine  2 patch Transdermal Daily  . metoprolol  5 mg Intravenous Once  . metoprolol succinate  50 mg Oral Daily  . Oxymorphone HCl (Crush Resist)  30 mg Oral Q12H  . pantoprazole  40 mg Oral Q1200  . rivaroxaban  10 mg Oral Q breakfast  . DISCONTD: HYDROmorphone PCA 0.3 mg/mL   Intravenous Q4H  . DISCONTD: metoprolol  50 mg Oral Q breakfast   Continuous Infusions:   . sodium chloride 20 mL/hr (01/12/12 1737)  . lactated ringers 100 mL/hr at 01/11/12 1701   PRN Meds:.acetaminophen, acetaminophen, bisacodyl, cyclobenzaprine, diphenhydrAMINE, diphenhydrAMINE, diphenhydrAMINE, lidocaine, LORazepam, menthol-cetylpyridinium, methocarbamol(ROBAXIN) IV, methocarbamol, metoCLOPramide (REGLAN) injection, metoCLOPramide, naloxone, ondansetron (ZOFRAN) IV, ondansetron (ZOFRAN) IV, ondansetron, oxyCODONE, phenol, sodium chloride   Assessment:   #1 sinus tachycardia, this is likely rebound tachycardia from not being on long-acting beta blocker, however since she is post knee arthroplasty, it is imperative to rule out pulmonary embolism. She has agreed to have a CT angiogram done. She is on rivaroxaban, for DVT prophylaxis. The dose may need to be adjusted if she is positive for PE. Since she is refusing a d-dimer, we'll also do a Doppler of bilateral lower extremities.  #2 hypertension stable beta blocker restarted  #3 chronic pain syndrome she takes oxymorphone at home this is being continued #4 disposition likely DC to SNF on Monday   LOS: 2 days   [facility] 01/13/2012, 2:26  PM

## 2012-01-13 NOTE — Progress Notes (Signed)
Occupational Therapy Evaluation Patient Details Name: Chloe Young MRN: [id number] DOB: [birth year] Today's Date: 01/13/2012 EV2 [phone] Problem List:  Patient Active Problem List  Diagnoses  . HYPERLIPIDEMIA  . HYPERTENSION  . RAYNAUD'S SYNDROME  . JOINT EFFUSION, LEFT KNEE  . KNEE PAIN, LEFT  . DISC DISEASE, CERVICAL  . NECK PAIN, CHRONIC  . NONSPEC ELEVATION OF LEVELS OF TRANSAMINASE/LDH    Past Medical History:  Past Medical History  Diagnosis Date  . Cervical post-laminectomy syndrome   . Cervical facet syndrome   . Cervicalgia   . Chronic pain syndrome   . Osteoarthritis, localized, knee     OA AND PAIN LEFT KNEE  -- S/P RIGHT  KNEE ARTHROPLASTY ON 82012--KNEE IS DOING WELL.   Past Surgical History:  Past Surgical History  Procedure Date  . Cervial fusion 2006  . Knee arthroscopy     BILATERAL  . Joint replacement AUG 2012     RIGHT KNEE ARTHROPLASTY    OT Assessment/Plan/Recommendation OT Assessment Clinical Impression Statement: All education completed. Pt will have necessary level of A from family upon d/c.  OT Recommendation/Assessment: Patient does not need any further OT services OT Recommendation Follow Up Recommendations: No OT follow up Equipment Recommended: None recommended by OT OT Goals    OT Evaluation Precautions/Restrictions  Precautions Precautions: Knee Required Braces or Orthoses: Yes Knee Immobilizer: Discontinue once straight leg raise with < 10 degree lag Restrictions Weight Bearing Restrictions: Yes LLE Weight Bearing: Weight bearing as tolerated Prior Functioning Home Living Lives With: Spouse Receives Help From: Family Type of Home: House Home Layout: One level Home Access: Stairs to enter Entrance Stairs-Rails: Right Entrance Stairs-Number of Steps: 5 Bathroom Shower/Tub: Forensic scientist: Standard Home Adaptive Equipment: Grab bars in shower;Walker - rolling;Crutches;Bedside  commode/3-in-1 Prior Function Level of Independence: Independent with basic ADLs;Independent with transfers;Independent with homemaking with ambulation;Needs assistance with gait Driving: Yes Vocation: Full time employment ADL ADL Grooming: Performed;Teeth care;Applying makeup;Brushing hair;Set up Where Assessed - Grooming: Sitting, chair;Supported Location manager Bathing: Performed;Set up Where Assessed - Upper Body Bathing: Unsupported;Sitting, chair Lower Body Bathing: Performed;Supervision/safety Where Assessed - Lower Body Bathing: Sit to stand from chair Upper Body Dressing: Performed;Set up Where Assessed - Upper Body Dressing: Sitting, chair;Unsupported Lower Body Dressing: Performed;Set up;Supervision/safety Where Assessed - Lower Body Dressing: Sit to stand from bed Toilet Transfer: Supervision/safety Toilet Transfer Method:  (sim to chair. ) Toileting - Clothing Manipulation: Simulated;Supervision/safety Where Assessed - Toileting Clothing Manipulation: Standing Toileting - Hygiene: Simulated;Supervision/safety Where Assessed - Toileting Hygiene: Standing Tub/Shower Transfer: Not assessed (Pt states she will spongebathe initially.) Tub/Shower Transfer Method: Not assessed Equipment Used: Rolling walker Vision/Perception  Vision - History Patient Visual Report: No change from baseline Vision - Assessment Vision Assessment: Vision not tested [employer] Arousal/Alertness: Awake/alert Overall Cognitive Status: Appears within functional limits for tasks assessed Orientation Level: Oriented X4 Sensation/Coordination   Extremity Assessment RUE Assessment RUE Assessment: Within Functional Limits LUE Assessment LUE Assessment: Within Functional Limits Mobility  Bed Mobility Supine to Sit: Other (comment) (Minguard A w/ VCs for hand & LE placement.) Transfers Transfers: Yes Sit to Stand: Other (comment) Stand to Sit: Other (comment) (Minguard A w/ VCs for hand &  LE placement.) Exercises   End of Session OT - End of Session Equipment Utilized During Treatment: Other (comment) (RW) Activity Tolerance: Patient tolerated treatment well Patient left: in chair;with call bell in reach General Behavior During Session: [facility] for tasks performed Cognition: [facility] for tasks performed   Belisa Eichholz  A, [id number] 01/13/2012, 10:20 AM

## 2012-01-13 NOTE — Progress Notes (Signed)
*  PRELIMINARY RESULTS*  Bilateral lower extremity venous Dopplers completed.  There is no obvious evidence of DVT or SVT in the bilateral lower extremities.  The right peroneal vein appears dilated but compresses well.      Sherren Kerns [name] 01/13/2012, 6:44 PM

## 2012-01-13 NOTE — Progress Notes (Signed)
Physical Therapy Treatment Patient Details Name: Chloe Young MRN: [id number] DOB: 1951/02/06 Today's Date: 01/13/2012 7829-5621 1GT,2TE  PT Assessment/Plan  PT - Assessment/Plan Comments on Treatment Session: Tolerated ambulation well and performing exercises with assist.  Appropriate for HHPT PT Plan: Discharge plan remains appropriate PT Frequency: 7X/week Follow Up Recommendations: Home health PT Equipment Recommended: None recommended by PT PT Goals  Acute Rehab PT Goals Pt will go Sit to Supine/Side: with min assist PT Goal: Sit to Supine/Side - Progress: Met Pt will go Sit to Stand: with min assist PT Goal: Sit to Stand - Progress: Met Pt will go Stand to Sit: with supervision PT Goal: Stand to Sit - Progress: Progressing toward goal Pt will Ambulate: 51 - 150 feet;with least restrictive assistive device;with supervision PT Goal: Ambulate - Progress: Progressing toward goal Pt will Perform Home Exercise Program: with supervision, verbal cues required/provided PT Goal: Perform Home Exercise Program - Progress: Progressing toward goal  PT Treatment Precautions/Restrictions  Precautions Precautions: Knee Required Braces or Orthoses: Yes Knee Immobilizer: Discontinue once straight leg raise with < 10 degree lag;On when out of bed or walking Restrictions Weight Bearing Restrictions: Yes LLE Weight Bearing: Weight bearing as tolerated Mobility (including Balance) Bed Mobility Sit to Supine: 4: Min assist Sit to Supine - Details (indicate cue type and reason): for left LE Transfers Sit to Stand: 4: Min assist;From chair/3-in-1;With upper extremity assist;With armrests Sit to Stand Details (indicate cue type and reason): cues for hand placement Stand to Sit: 4: Min assist;To chair/3-in-1;To bed;With upper extremity assist Stand to Sit Details: cues for leg out to sit Ambulation/Gait Ambulation/Gait Assistance: 4: Min assist Ambulation/Gait Assistance Details (indicate  cue type and reason): cues for sequence Ambulation Distance (Feet): 120 Feet Assistive device: Rolling Chloe Gait Pattern: Antalgic    Exercise  Total Joint Exercises Ankle Circles/Pumps: AROM;20 reps;Seated;Both Quad Sets: AROM;10 reps;Seated;Left Short Arc QuadBarbaraann Young;Left;10 reps;Seated Heel Slides: AAROM;Left;10 reps;Seated Hip ABduction/ADduction: AAROM;Left;10 reps;Seated Straight Leg Raises: AAROM;Left;10 reps;Seated End of Session PT - End of Session Equipment Utilized During Treatment: Gait belt Activity Tolerance: Patient tolerated treatment well Patient left: in bed;with call bell in reach;with family/visitor present General Behavior During Session: [facility] for tasks performed Cognition: [facility] for tasks performed  [facility] 01/13/2012, 2:49 PM

## 2012-01-13 NOTE — Progress Notes (Signed)
Subjective: 2 Days Post-Op Procedure(s) (LRB): TOTAL KNEE ARTHROPLASTY (Left) Patient reports pain as 4 on 0-10 scale.   Denies CP or SOB.  Voiding without difficulty. Positive flatus. Objective: Vital signs in last 24 hours: Temp:  [98.9 F (37.2 C)-99.9 F (37.7 C)] 98.9 F (37.2 C) (01/19 0446) Pulse Rate:  [96-128] 118  (01/19 0749) Resp:  [14-18] 14  (01/19 0446) BP: (142-169)/(88-105) 151/88 mmHg (01/19 0446) SpO2:  [91 %-99 %] 99 % (01/19 0446)  Intake/Output from previous day: 01/18 0701 - 01/19 0700 In: 2874.3 [P.O.:600; I.V.:2274.3] Out: 1450 [Urine:1450] Intake/Output this shift:     Basename 01/13/12 0510 01/12/12 0420  HGB 12.7 12.0    Basename 01/13/12 0510 01/12/12 0420  WBC 8.2 6.8  RBC 3.98 3.81*  HCT 35.2* 34.8*  PLT 170 149*    Basename 01/13/12 0510 01/12/12 0420  NA 129* 137  K 3.7 4.1  CL 94* 97  CO2 23 27  BUN 7 8  CREATININE 0.52 0.57  GLUCOSE 122* 101*  CALCIUM 9.2 9.0   No results found for this basename: LABPT:2,INR:2 in the last 72 hours  Neurologically intact Sensation intact distally Dorsiflexion/Plantar flexion intact Incision: no drainage Compartment soft  Assessment/Plan: 2 Days Post-Op Procedure(s) (LRB): TOTAL KNEE ARTHROPLASTY (Left) D/c likely Monday Hyponatremia will monitor, HEP lock IV Remains tachycardic but asymptomatic will defer treatment to IM   Khiree Bukhari R. 01/13/2012, 8:03 AM

## 2012-01-14 LAB — CBC
Hemoglobin: 10.8 g/dL — ABNORMAL LOW (ref 12.0–15.0)
RBC: 3.43 MIL/uL — ABNORMAL LOW (ref 3.87–5.11)
WBC: 6.1 10*3/uL (ref 4.0–10.5)

## 2012-01-14 LAB — BASIC METABOLIC PANEL
CO2: 26 mEq/L (ref 19–32)
GFR calc non Af Amer: >90 mL/min (ref >90)
Glucose, Bld: 130 mg/dL — ABNORMAL HIGH (ref 70–99)
Potassium: 3.5 mEq/L (ref 3.5–5.1)
Sodium: 133 mEq/L — ABNORMAL LOW (ref 135–145)

## 2012-01-14 MED ORDER — BISACODYL 10 MG RE SUPP: 10.0000 mg | Freq: Once | RECTAL | Status: DC

## 2012-01-14 MED ORDER — METOPROLOL SUCCINATE ER 25 MG PO TB24
75.0000 mg | ORAL_TABLET | Freq: Every day | ORAL | Status: DC
  Administered 2012-01-15: 75 mg via ORAL
  Filled 2012-01-14: qty 1

## 2012-01-14 MED ORDER — BISACODYL 10 MG RE SUPP
10.0000 mg | Freq: Once | RECTAL | Status: DC
  Filled 2012-01-14: qty 1

## 2012-01-14 NOTE — Progress Notes (Signed)
Orthopedic Tech Progress Note Patient Details:  Chloe Young 1951-08-26 [id number] Did not put a cast on it was a mistake on the interventions. CPM Left Knee CPM Left Knee: Off Left Knee Flexion (Degrees): 0  Left Knee Extension (Degrees): [address]    Tawni Carnes [facility] 01/14/2012, 8:48 PM

## 2012-01-14 NOTE — Progress Notes (Signed)
Physical Therapy Treatment Patient Details Name: Chloe Young MRN: [id number] DOB: [birth year] Today's Date: 01/14/2012 [id number] 1GT PT Assessment/Plan  PT - Assessment/Plan Comments on Treatment Session: Patient supervision for ambulation will be ready for d/c home with spouse assist after stairs practice in am. PT Plan: Discharge plan remains appropriate PT Frequency: 7X/week Follow Up Recommendations: Home health PT Equipment Recommended: None recommended by PT PT Goals  Acute Rehab PT Goals Pt will go Supine/Side to Sit: with min assist PT Goal: Supine/Side to Sit - Progress: Met Pt will go Sit to Stand: with supervision PT Goal: Sit to Stand - Progress: Met Pt will go Stand to Sit: with supervision PT Goal: Stand to Sit - Progress: Progressing toward goal Pt will Ambulate: 51 - 150 feet;with supervision;with least restrictive assistive device PT Goal: Ambulate - Progress: Met PT Treatment Precautions/Restrictions  Precautions Precautions: Knee Required Braces or Orthoses: Yes Knee Immobilizer: On when out of bed or walking;Discontinue once straight leg raise with < 10 degree lag Restrictions Weight Bearing Restrictions: No LLE Weight Bearing: Weight bearing as tolerated Mobility (including Balance) Bed Mobility Bed Mobility: No (pt in chair, exercises only this session) Supine to Sit: 4: Min assist Supine to Sit Details (indicate cue type and reason): for left leg Transfers Sit to Stand: 4: Min assist;With upper extremity assist;From bed;From toilet Sit to Stand Details (indicate cue type and reason): supervision from 3:1 over toliet with grabbar Stand to Sit: 4: Min assist;To chair/3-in-1;With armrests;With upper extremity assist Stand to Sit Details: cues for saefty Ambulation/Gait Ambulation/Gait Assistance: 5: Supervision Ambulation/Gait Assistance Details (indicate cue type and reason): cues for decreased vauling on right foot Ambulation Distance (Feet): 135  Feet Assistive device: Rolling walker Gait Pattern: Step-to pattern;Decreased stride length;Antalgic    Exercise   End of Session PT - End of Session Equipment Utilized During Treatment: Gait belt;Left knee immobilizer Activity Tolerance: Patient tolerated treatment well Patient left: in chair;with call bell in reach General Behavior During Session: [facility] for tasks performed Cognition: [facility] for tasks performed  [facility] 01/14/2012, 2:05 PM

## 2012-01-14 NOTE — Progress Notes (Signed)
Encouraged pt to take Dulcolax po this am with warm Prune Juice. Will reassess need for suppository this pm.

## 2012-01-14 NOTE — Progress Notes (Signed)
Patient offered 6am scheduled suppository.  Originally, patient declined until a later time (RN rescheduled for 8am).  The patient then stated that she would rather not take a suppository at all.  After the last knee replacement she had, she took a suppository and it produced no results.  She states she will have a BM when she gets home. No abdominal discomfort. Taking PO's well, positive bowel sounds and passing gas. Will continue to monitor.  RN also informed patient that if she should change her mind, to let staff know. Kwane Rohl [place], [place] 01/14/2012 6:30 AM

## 2012-01-14 NOTE — Progress Notes (Signed)
Physical Therapy Treatment Patient Details Name: Chloe Young MRN: [id number] DOB: [birth year] Today's Date: 01/14/2012 8295-6213 2TE  PT Assessment/Plan  PT - Assessment/Plan Comments on Treatment Session: Patient progressing slowly with exercises, but educated how to use sheet for SLR self assist.  Will walk later due to fatigue after therex and amb to bathroom earlier. PT Plan: Discharge plan remains appropriate PT Frequency: 7X/week Follow Up Recommendations: Home health PT Equipment Recommended: None recommended by PT PT Goals  Acute Rehab PT Goals Pt will Perform Home Exercise Program: with supervision, verbal cues required/provided PT Goal: Perform Home Exercise Program - Progress: Progressing toward goal  PT Treatment Precautions/Restrictions  Precautions Precautions: Knee Required Braces or Orthoses: Yes Knee Immobilizer: On when out of bed or walking;Discontinue once straight leg raise with < 10 degree lag Restrictions Weight Bearing Restrictions: No LLE Weight Bearing: Weight bearing as tolerated Mobility (including Balance) Bed Mobility Bed Mobility: No (pt in chair, exercises only this session)    Exercise  Total Joint Exercises Ankle Circles/Pumps: AROM;20 reps;Seated Quad Sets: AROM;Left;10 reps;Seated Short Arc Quad: AAROM;Left;10 reps;Seated Heel Slides: AAROM;Left;10 reps;Seated Hip ABduction/ADduction: AROM;Left;10 reps;Seated Straight Leg Raises: AAROM;Left;10 reps;Seated End of Session PT - End of Session Equipment Utilized During Treatment: Gait belt;Left knee immobilizer Activity Tolerance: Patient tolerated treatment well Patient left: in chair General Behavior During Session: [facility] for tasks performed Cognition: [facility] for tasks performed  [facility] 01/14/2012, 2:00 PM

## 2012-01-14 NOTE — Progress Notes (Signed)
Awake/alert. Mod. Pain.  N/V + LE's.  H/H stable. Dressing dry. Prob. home in am.

## 2012-01-14 NOTE — Progress Notes (Signed)
Subjective: Heart rate is improved overnight  Objective: Vital signs in last 24 hours: Filed Vitals:   01/13/12 1454 01/13/12 2134 01/14/12 0510 01/14/12 0949  BP: 128/87 129/84 120/83   Pulse: 122 116 96 100  Temp: 97.2 F (36.2 C) 99 F (37.2 C) 98.9 F (37.2 C)   TempSrc: Oral Oral Oral   Resp: 20 16 16    Height:      Weight:      SpO2: 100% 96% 99%     Intake/Output Summary (Last 24 hours) at 01/14/12 1052 Last data filed at 01/14/12 0930  Gross per 24 hour  Intake   1638 ml  Output   3150 ml  Net  -1512 ml    Weight change:   GENERAL: This is a thin female who walks with antalgic gait, slightly  anxious in nature.  HEENT: Atraumatic, normocephalic. Pupils equal, round, reactive to  light.  NECK: Supple. No lymphadenopathy.  CHEST: Clear to auscultation bilaterally.  Heart: Regular rate and rhythm without murmurs, gallops, or rubs.  ABDOMEN: Soft, nontender, nondistended. Bowel sounds x4.  SKIN: No rashes or lesions are noted in regard the knee   Lab Results: Results for orders placed during the hospital encounter of 01/11/12 (from the past 24 hour(s))  CBC     Status: Abnormal   Collection Time   01/14/12  8:27 AM      Component Value Range   WBC 6.1  4.0 - 10.5 (K/uL)   RBC 3.43 (*) 3.87 - 5.11 (MIL/uL)   Hemoglobin 10.8 (*) 12.0 - 15.0 (g/dL)   HCT 16.1 (*) 09.6 - 46.0 (%)   MCV 89.5  78.0 - 100.0 (fL)   MCH 31.5  26.0 - 34.0 (pg)   MCHC 35.2  30.0 - 36.0 (g/dL)   RDW 04.5  40.9 - 81.1 (%)   Platelets 173  150 - 400 (K/uL)  BASIC METABOLIC PANEL     Status: Abnormal   Collection Time   01/14/12  8:27 AM      Component Value Range   Sodium 133 (*) 135 - 145 (mEq/L)   Potassium 3.5  3.5 - 5.1 (mEq/L)   Chloride 98  96 - 112 (mEq/L)   CO2 26  19 - 32 (mEq/L)   Glucose, Bld 130 (*) 70 - 99 (mg/dL)   BUN 8  6 - 23 (mg/dL)   Creatinine, Ser 9.14  0.50 - 1.10 (mg/dL)   Calcium 8.9  8.4 - 78.2 (mg/dL)   GFR calc non Af Amer >90  >90 (mL/min)   GFR  calc Af Amer >90  >90 (mL/min)     Micro: Recent Results (from the past 240 hour(s))  SURGICAL PCR SCREEN     Status: Normal   Collection Time   01/08/12 11:50 AM      Component Value Range Status Comment   MRSA, PCR NEGATIVE  NEGATIVE  Final    Staphylococcus aureus NEGATIVE  NEGATIVE  Final     Studies/Results: Ct Angio Chest W/cm &/or Wo Cm  01/13/2012  *RADIOLOGY REPORT*  Clinical Data: Elevated D-dimer, postop total knee, evaluate for PE  CT ANGIOGRAPHY CHEST  Technique:  Multidetector CT imaging of the chest using the standard protocol during bolus administration of intravenous contrast. Multiplanar reconstructed images including MIPs were obtained and reviewed to evaluate the vascular anatomy.  Contrast: OMNIPAQUE IOHEXOL 300 MG/ML IV SOLN  Comparison: Chest radiographs dated 07/24/2011  Findings: No evidence of pulmonary embolism.  Mild dependent atelectasis at  the lung bases.  Lungs otherwise clear.  No pleural effusion or pneumothorax.  Visualized thyroid is notable for right thyroid nodules.  The heart is normal in size.  No pericardial effusion.  No suspicious mediastinal, hilar, or axillary lymphadenopathy.  Visualized upper abdomen is unremarkable.  Mild degenerative changes of the thoracic spine with mild superior endplate compression of a lower thoracic vertebral body, likely T9.  IMPRESSION: No evidence of pulmonary embolism.  No evidence of acute cardiopulmonary disease.  Original Report Authenticated By: Charline Bills, M.D.    Medications:  Scheduled Meds:   . bisacodyl  10 mg Rectal Once  . docusate sodium  100 mg Oral BID  . lidocaine  2 patch Transdermal Daily  . metoprolol succinate  75 mg Oral Daily  . Oxymorphone HCl (Crush Resist)  30 mg Oral Q12H  . pantoprazole  40 mg Oral Q1200  . rivaroxaban  10 mg Oral Q breakfast  . DISCONTD: bisacodyl  10 mg Rectal Once  . DISCONTD: bisacodyl  10 mg Rectal Once  . DISCONTD: metoprolol  50 mg Oral Q breakfast  .  DISCONTD: metoprolol succinate  50 mg Oral Daily   Continuous Infusions:   . sodium chloride 20 mL/hr (01/12/12 1737)  . lactated ringers 100 mL/hr at 01/11/12 1701   PRN Meds:.acetaminophen, acetaminophen, bisacodyl, cyclobenzaprine, diphenhydrAMINE, diphenhydrAMINE, diphenhydrAMINE, iohexol, lidocaine, LORazepam, menthol-cetylpyridinium, methocarbamol(ROBAXIN) IV, methocarbamol, metoCLOPramide (REGLAN) injection, metoCLOPramide, naloxone, ondansetron (ZOFRAN) IV, ondansetron (ZOFRAN) IV, ondansetron, oxyCODONE, phenol, sodium chloride   Assessment:   #1 sinus tachycardia, this is likely rebound tachycardia from not being on long-acting beta blocker, however since she is also status post knee arthroplasty, she has been ruled out for pulmonary embolism and DVT in her legs.   #2 hypertension stable beta blocker restarted , and was increased to 75 mg at the heart rate is still uncontrolled #3 chronic pain syndrome she takes oxymorphone at home this is being continued  #4 disposition likely DC to SNF on Monday    2-D echo reviewed with results as follows Left ventricle: The cavity size was normal. Wall thickness was normal. Systolic function was normal. The estimated ejection fraction was 55%. Although no diagnostic regional wall motion abnormality was identified, this possibility cannot be completely excluded on the basis of this study. Doppler parameters are consistent with abnormal left ventricular relaxation (grade 1 diastolic dysfunction). - Aortic valve: There was no stenosis.   Will sign off at this time,    LOS: 3 days   [facility] 01/14/2012, 10:52 AM

## 2012-01-15 ENCOUNTER — Encounter (HOSPITAL_COMMUNITY): Payer: Self-pay | Admitting: Specialist

## 2012-01-15 LAB — BASIC METABOLIC PANEL
BUN: 10 mg/dL (ref 6–23)
Chloride: 98 mEq/L (ref 96–112)
GFR calc Af Amer: >90 mL/min (ref >90)
Glucose, Bld: 113 mg/dL — ABNORMAL HIGH (ref 70–99)
Potassium: 3.7 mEq/L (ref 3.5–5.1)

## 2012-01-15 MED ORDER — DSS 100 MG PO CAPS: 100.0000 mg | ORAL_CAPSULE | Freq: Two times a day (BID) | ORAL | Status: AC

## 2012-01-15 MED ORDER — RIVAROXABAN 10 MG PO TABS: 10.0000 mg | ORAL_TABLET | Freq: Every day | ORAL | Status: DC

## 2012-01-15 MED ORDER — OXYCODONE HCL 15 MG PO TABS: 15.0000 mg | ORAL_TABLET | ORAL | Status: AC | PRN

## 2012-01-15 MED FILL — Sodium Chloride Irrigation Soln 0.9%: Qty: 500 | Status: AC

## 2012-01-15 NOTE — Progress Notes (Signed)
  CARE MANAGEMENT NOTE 01/15/2012  Patient:  Chloe Young, Chloe Young   Account Number:  [id number]  Date Initiated:  01/12/2012  Documentation initiated by:  Colleen Can  Subjective/Objective Assessment:   dx degenerative joint disease of knee-left; total knee replacemnt     Action/Plan:   Pt palns to go home with spoiuse as caregiver. Already has DME. Wants [name] for [facility] services.   Anticipated DC Date:  01/14/2012   Anticipated DC Plan:  HOME W HOME HEALTH SERVICES  In-house referral  NA      DC Planning Services  CM consult      [facility] Choice  HOME HEALTH   Choice offered to / List presented to:  [name]   DME arranged  NA      DME agency  NA     HH arranged  HH-2 PT      [facility] agency  [facility]   Status of service:  Completed, signed off Medicare Important Message given?  NA - LOS <3 / Initial given by admissions (If response is "NO", the following Medicare IM given date fields will be blank) Date Medicare IM given:   Date Additional Medicare IM given:    Discharge Disposition:  HOME W HOME HEALTH SERVICES  Per UR Regulation:    Comments:  01/15/2012 Raynelle Bring BSN CCM [phone] Pt for discharge today. [name] will start [facility] services tomorrow 01/16/2012

## 2012-01-15 NOTE — Discharge Summary (Signed)
Physician Discharge Summary  Patient ID: Chloe Young MRN: [id number] DOB/AGE: 03-29-1951 61 y.o.  Admit date: 01/11/2012 Discharge date: 01/15/2012  Admission Diagnoses: DJD left knee HTN  Chronic pain syndrome  Discharge Diagnoses:  DJD left knee s/p left TKA Hyponatremia resolved Tachycardia resolved HTN  Chronic pain syndrome   Discharged Condition: good  Hospital Course:  Patient did well post op.  She did have some pain control issues that were addressed and have episode of sinus tachycardia which was asymptomatic.  However a hospital consult was called and they a through workup without any significant findings.  The patient remained stable and progress with PT as expected.  Otherwise hospital course was uneventful.  See chart for full details.   Basename 01/14/12 0827 01/13/12 0510  WBC 6.1 8.2  HCT 30.7* 35.2*  PLT 173 170    Basename 01/15/12 0344 01/14/12 0827  NA 134* 133*  K 3.7 3.5  CL 98 98  CO2 26 26  GLUCOSE 113* 130*  BUN 10 8  CREATININE 0.49* 0.53  CALCIUM 8.9 8.9    Procedure Note: PRE-OPERATIVE DIAGNOSIS: degenerative joint disease left knee  POST-OPERATIVE DIAGNOSIS: degenerative joint disease left knee  PROCEDURE: Procedure(s):  TOTAL KNEE ARTHROPLASTY  SURGEON: Surgeon(s):  Javier Docker, MD  PHYSICIAN ASSISTANT:  ASSISTANTS: Juris Gosnell  ANESTHESIA: general  EBL: Total I/O  In: 1000 [I.V.:1000]  Out: 250 [Urine:250]  BLOOD ADMINISTERED:none  DRAINS: (1) Hemovact drain(s) in the open with Suction Open  LOCAL MEDICATIONS USED: MARCAINE 20CC   Consults: PT/OT, [facility]  Significant Diagnostic Studies: 2-d echo, related cardiac labs  Disposition: Home or Self Care  Discharge Orders    Future Appointments: Provider: Department: Dept Phone: Center:   03/18/2012 10:20 AM Ranelle Oyster, MD [facility] [phone] CPR     Future Orders Please Complete By Expires   Diet - low sodium heart healthy      Call MD /  Call 911      Comments:   If you experience chest pain or shortness of breath, CALL 911 and be transported to the hospital emergency room.  If you develope a fever above 101 F, pus (white drainage) or increased drainage or redness at the wound, or calf pain, call your surgeon's office.   Constipation Prevention      Comments:   Drink plenty of fluids.  Prune juice may be helpful.  You may use a stool softener, such as Colace (over the counter) 100 mg twice a day.  Use MiraLax (over the counter) for constipation as needed.   Increase activity slowly as tolerated      Weight Bearing as taught in Physical Therapy      Comments:   Use a walker or crutches as instructed.   Discharge instructions      Comments:   Use knee immobilizer until can SLR x 10 Elevate above heart level at least 6 x a day for 20 minutes Daily dressing change OK to shower     TED hose      Comments:   Use stockings (TED hose) for 2 weeks on both leg(s).  You may remove them at night for sleeping.   Change dressing      Comments:   change the dressing daily with sterile 4 x 4 inch gauze dressing and apply TED hose.     Current Discharge Medication List    START taking these medications   Details  docusate sodium 100 MG CAPS  Take 100 mg by mouth 2 (two) times daily. Qty: 30 capsule, Refills: 1    rivaroxaban (XARELTO) 10 MG TABS tablet Take 1 tablet (10 mg total) by mouth daily with breakfast. Qty: 12 tablet, Refills: 0      CONTINUE these medications which have CHANGED   Details  oxyCODONE (ROXICODONE) 15 MG immediate release tablet Take 1 tablet (15 mg total) by mouth every 3 (three) hours as needed. Qty: 60 tablet, Refills: 0      CONTINUE these medications which have NOT CHANGED   Details  Calcium Carbonate-Vitamin D (CALCIUM + D PO) Take 1 tablet by mouth daily.     cyclobenzaprine (AMRIX) 15 MG 24 hr capsule Take 15 mg by mouth daily as needed. Muscle spasm     lidocaine (XYLOCAINE) 5 % ointment  Apply 1 application topically 3 (three) times daily as needed.     metoprolol (LOPRESSOR) 50 MG tablet Take 50 mg by mouth daily with breakfast.     Multiple Vitamin (MULITIVITAMIN WITH MINERALS) TABS Take 1 tablet by mouth daily.     omeprazole (PRILOSEC) 20 MG capsule Take 20 mg by mouth daily.    oxymorphone (OPANA ER) 30 MG 12 hr tablet Take 30 mg by mouth every 12 (twelve) hours.     lidocaine (LIDODERM) 5 % Place 2 patches onto the skin daily. Remove & Discard patch within 12 hours or as directed by MD      STOP taking these medications     meloxicam (MOBIC) 15 MG tablet        Follow-up Information    Follow up with BEANE,JEFFREY C, MD in 10 days.   Contact information:   [facility] [address] [place] [address] [phone]          Signed: Liam Graham. 01/15/2012, 8:39 AM

## 2012-01-15 NOTE — Progress Notes (Signed)
Physical Therapy Treatment Patient Details Name: Chloe Young MRN: [id number] DOB: 12/10/1951 Today's Date: 01/15/2012 1100-1118 1g PT Assessment/Plan  PT - Assessment/Plan Comments on Treatment Session: pt has achieved goals, for DC PT Plan: Discharge plan remains appropriate PT Frequency: 7X/week Follow Up Recommendations: Home health PT Equipment Recommended: None recommended by PT PT Goals  Acute Rehab PT Goals Pt will go Stand to Sit: with supervision PT Goal: Stand to Sit - Progress: Met Pt will Go Up / Down Stairs: 3-5 stairs;with min assist;with least restrictive assistive device PT Goal: Up/Down Stairs - Progress: Met Pt will Perform Home Exercise Program: with supervision, verbal cues required/provided PT Goal: Perform Home Exercise Program - Progress: Met  PT Treatment Precautions/Restrictions  Precautions Precautions: Knee Required Braces or Orthoses: Yes Knee Immobilizer: On when out of bed or walking Restrictions Weight Bearing Restrictions: No LLE Weight Bearing: Weight bearing as tolerated Mobility (including Balance)  Stairs: Yes Stairs Assistance: 4: Min assist Stairs Assistance Details (indicate cue type and reason): vc for spouse on how to assist pt Stair Management Technique: No rails;Backwards;With walker Number of Stairs: 4  Height of Stairs: 6     Exercise  End of Session PT - End of Session Equipment Utilized During Treatment: Left knee immobilizer Activity Tolerance: Patient tolerated treatment well Patient left: in chair;with family/visitor present  Rada Hay 01/15/2012, 12:02 PM

## 2012-01-15 NOTE — Progress Notes (Signed)
Subjective: 4 Days Post-Op Procedure(s) (LRB): TOTAL KNEE ARTHROPLASTY (Left) Patient reports pain as 3 on 0-10 scale.   Denies CP or SOB.  Voiding without difficulty. Positive flatus. Objective: Vital signs in last 24 hours: Temp:  [98.3 F (36.8 C)-98.9 F (37.2 C)] 98.9 F (37.2 C) (01/21 0540) Pulse Rate:  [97-123] 123  (01/21 0946) Resp:  [16] 16  (01/21 0540) BP: (111-134)/(76-85) 134/85 mmHg (01/21 0946) SpO2:  [97 %-99 %] 97 % (01/21 0540)  Intake/Output from previous day: 01/20 0701 - 01/21 0700 In: 1080 [P.O.:1080] Out: 3200 [Urine:3200] Intake/Output this shift: Total I/O In: -  Out: 300 [Urine:300]   Basename 01/14/12 0827 01/13/12 0510  HGB 10.8* 12.7    Basename 01/14/12 0827 01/13/12 0510  WBC 6.1 8.2  RBC 3.43* 3.98  HCT 30.7* 35.2*  PLT 173 170    Basename 01/15/12 0344 01/14/12 0827  NA 134* 133*  K 3.7 3.5  CL 98 98  CO2 26 26  BUN 10 8  CREATININE 0.49* 0.53  GLUCOSE 113* 130*  CALCIUM 8.9 8.9   No results found for this basename: LABPT:2,INR:2 in the last 72 hours  Neurologically intact Neurovascular intact Dorsiflexion/Plantar flexion intact Incision: no drainage Compartment soft  Assessment/Plan: 4 Days Post-Op Procedure(s) (LRB): TOTAL KNEE ARTHROPLASTY (Left) Discharge home with home health  Dietra Stokely R. 01/15/2012, 10:13 AM

## 2012-01-15 NOTE — Progress Notes (Signed)
Physical Therapy Treatment Patient Details Name: Chloe Young MRN: [id number] DOB: 12/15/1951 Today's Date: 01/15/2012 1000-1045 e g hm PT Assessment/Plan  PT - Assessment/Plan Comments on Treatment Session: pt needs to practice Steps after spouse arrives,. then for DC PT Plan: Discharge plan remains appropriate PT Frequency: 7X/week Follow Up Recommendations: Home health PT Equipment Recommended: None recommended by PT PT Goals  Acute Rehab PT Goals Pt will go Stand to Sit: with supervision PT Goal: Stand to Sit - Progress: Met Pt will Perform Home Exercise Program: with supervision, verbal cues required/provided PT Goal: Perform Home Exercise Program - Progress: Met  PT Treatment Precautions/Restrictions  Precautions Precautions: Knee Required Braces or Orthoses: Yes Knee Immobilizer: On when out of bed or walking Restrictions Weight Bearing Restrictions: No LLE Weight Bearing: Weight bearing as tolerated Mobility (including Balance) Bed Mobility Bed Mobility: Yes Supine to Sit: 6: Modified independent (Device/Increase time) Supine to Sit Details (indicate cue type and reason): used sheet like her lifter Sit to Supine: 6: Modified independent (Device/Increase time) Transfers Sit to Stand: 5: Supervision;From bed Sit to Stand Details (indicate cue type and reason): pt demo safe technique Stand to Sit: 5: Supervision;To bed Stand to Sit Details: pt places LLE forward Ambulation/Gait Ambulation/Gait: Yes Ambulation/Gait Assistance: 5: Supervision Ambulation Distance (Feet): 100 Feet Assistive device: Rolling walker Gait Pattern: Step-to pattern;Decreased stride length;Antalgic    Exercise  Total Joint Exercises Ankle Circles/Pumps: AROM;20 reps;Supine Quad Sets: Supine;AROM;Left Short Arc Quad: AAROM;Left;Supine;10 reps Heel Slides: AAROM;Left;10 reps;Supine Hip ABduction/ADduction: AROM;Left;10 reps;Supine Straight Leg Raises: AAROM;Left;10 reps;Supine End of  Session PT - End of Session Equipment Utilized During Treatment: Left knee immobilizer Activity Tolerance: Patient tolerated treatment well Patient left: in bed;with call bell in reach  H[place]ricane 01/15/2012, 11:57 AM

## 2012-02-05 ENCOUNTER — Other Ambulatory Visit: Payer: Self-pay | Admitting: Internal Medicine

## 2012-02-05 NOTE — Telephone Encounter (Signed)
Patient needs to schedule a CPX  

## 2012-02-08 ENCOUNTER — Telehealth: Payer: Self-pay | Admitting: Internal Medicine

## 2012-02-08 MED ORDER — METOPROLOL TARTRATE 50 MG PO TABS: ORAL_TABLET | ORAL | Status: DC

## 2012-02-08 NOTE — Telephone Encounter (Signed)
Rx sent already on 02/05/12. Resent Rx again.

## 2012-02-08 NOTE — Telephone Encounter (Signed)
[employer]- Pt called stating she needs refill on metropolol for BP. She has appt 04-01-12 for CPE.

## 2012-03-08 ENCOUNTER — Telehealth: Payer: Self-pay | Admitting: Internal Medicine

## 2012-03-08 MED ORDER — METOPROLOL TARTRATE 50 MG PO TABS: ORAL_TABLET | ORAL | Status: DC

## 2012-03-08 NOTE — Telephone Encounter (Signed)
Refill for  Metoprolol Tartrate (Tab) 50 MG   TAKE 1/2 TO 1 TABLET DAILY FOR BLOOD PRESSURE GREATER THAN 130/85  Qty 30 \\last  fill date 2.11.13

## 2012-03-08 NOTE — Telephone Encounter (Signed)
RX sent

## 2012-03-18 ENCOUNTER — Encounter: Payer: Self-pay | Admitting: Physical Medicine & Rehabilitation

## 2012-03-18 ENCOUNTER — Encounter: Payer: BC Managed Care – PPO | Attending: Neurosurgery | Admitting: Physical Medicine & Rehabilitation

## 2012-03-18 VITALS — BP 135/75 | HR 78 | Resp 14 | Ht 63.0 in | Wt 149.0 lb

## 2012-03-18 DIAGNOSIS — M961 Postlaminectomy syndrome, not elsewhere classified: Secondary | ICD-10-CM | POA: Insufficient documentation

## 2012-03-18 DIAGNOSIS — M5030 Other cervical disc degeneration, unspecified cervical region: Secondary | ICD-10-CM

## 2012-03-18 DIAGNOSIS — M170 Bilateral primary osteoarthritis of knee: Secondary | ICD-10-CM

## 2012-03-18 DIAGNOSIS — M1710 Unilateral primary osteoarthritis, unspecified knee: Secondary | ICD-10-CM

## 2012-03-18 MED ORDER — OXYCODONE HCL 15 MG PO TABS: 15.0000 mg | ORAL_TABLET | ORAL | Status: AC | PRN

## 2012-03-18 MED ORDER — OXYMORPHONE HCL ER 20 MG PO TB12: 20.0000 mg | ORAL_TABLET | Freq: Two times a day (BID) | ORAL | Status: AC

## 2012-03-18 NOTE — Progress Notes (Signed)
  Subjective:    Patient ID: Chloe Young, female    DOB: [birth year], 61 y.o.   MRN: [id number]  HPI Chloe Young is back regarding her cervicalgia. She had her second knee replacement in January.  Things have gone well with that. She's going back to work sometimes over the next month or so. The opana helps as does the lidoderm patches. She uses the lidoderm patches at night.  The biggest problem she's having now is that her husband is retiring next month and she will no longer be able to pay the facility fee here.  She wanted to discuss options with me today.   Pain Inventory Average Pain 3 Pain Right Now 3 My pain is burning  In the last 24 hours, has pain interfered with the following? General activity 3 Relation with others 3 Enjoyment of life 3 What TIME of day is your pain at its worst? evening Sleep (in general) Poor  Pain is worse with: unsure Pain improves with: rest, heat/ice, medication and TENS Relief from Meds: 7  Mobility walk without assistance ability to climb steps?  yes do you drive?  yes  Function employed # of hrs/week 30  Neuro/Psych No problems in this area  Prior Studies Any changes since last visit?  no  Physicians involved in your care Any changes since last visit?  no  Review of Systems  Constitutional: Negative.   HENT: Negative.   Eyes: Negative.   Respiratory: Negative.   Cardiovascular: Negative.   Gastrointestinal: Negative.   Genitourinary: Negative.   Musculoskeletal: Negative.   Skin: Negative.   Neurological: Negative.   Hematological: Negative.   Psychiatric/Behavioral: Negative.        Objective:   Physical Exam  Constitutional: She is oriented to person, place, and time. She appears well-developed and well-nourished.  HENT:  Head: Normocephalic and atraumatic.  Eyes: EOM are normal.  Neck: Neck supple.  Cardiovascular: Normal rate and regular rhythm.   Pulmonary/Chest: Effort normal and breath sounds normal.  Abdominal:  Soft. Bowel sounds are normal.  Musculoskeletal: Normal range of motion.       Neck rom is fair, still a little limited in flexion.  Mild pain with ROM today.  Can only flex left knee to 90.  Still some swelling around the left knee.  Minimal tenderness  Neurological: She is alert and oriented to person, place, and time. She has normal strength. No cranial nerve deficit or sensory deficit.  Skin: Skin is warm.  Psychiatric: She has a normal mood and affect. Her behavior is normal. Judgment and thought content normal.          Assessment & Plan:  ASSESSMENT:  1. Cervical postlaminectomy syndrome.  2. Right knee arthroplasty and left knee arthroplasty. Making nice progress    PLAN: 1. Will decrease opana er to 20mg .  We will consider tapering further if she tolerates this. If there is difficulty with the decrease, she will look at other pain clinics without a facility fee.  Dr. Alwyn Ren also may be able to fill this for her as she's been quite stable. 2. Refill oxy ir 15mg  #75 today. 3. She continue with lidoderm patches 4. F/u likely prn due to the above circumstances.  I would be willing to taper her down without a follow up visit, depending on how well she tolerates

## 2012-03-18 NOTE — Patient Instructions (Signed)
Call me back in 2 weeks if you want to wean down further to the 10mg  ER dose.  You also need to wean the breakthrough oxycodone down.  Follow up with Dr. Alwyn Ren regarding him potentially writing pain medication for you.

## 2012-04-01 ENCOUNTER — Encounter: Payer: BC Managed Care – PPO | Admitting: Internal Medicine

## 2012-04-22 ENCOUNTER — Other Ambulatory Visit: Payer: Self-pay | Admitting: Internal Medicine

## 2012-04-23 ENCOUNTER — Encounter: Payer: Self-pay | Admitting: Gynecology

## 2012-04-23 ENCOUNTER — Other Ambulatory Visit: Payer: Self-pay | Admitting: *Deleted

## 2012-04-23 ENCOUNTER — Ambulatory Visit (INDEPENDENT_AMBULATORY_CARE_PROVIDER_SITE_OTHER): Payer: BC Managed Care – PPO | Admitting: Gynecology

## 2012-04-23 VITALS — BP 140/82 | Ht 62.25 in | Wt 145.0 lb

## 2012-04-23 DIAGNOSIS — M8580 Other specified disorders of bone density and structure, unspecified site: Secondary | ICD-10-CM

## 2012-04-23 DIAGNOSIS — M899 Disorder of bone, unspecified: Secondary | ICD-10-CM

## 2012-04-23 DIAGNOSIS — Z79890 Hormone replacement therapy (postmenopausal): Secondary | ICD-10-CM

## 2012-04-23 DIAGNOSIS — N952 Postmenopausal atrophic vaginitis: Secondary | ICD-10-CM

## 2012-04-23 DIAGNOSIS — Z01419 Encounter for gynecological examination (general) (routine) without abnormal findings: Secondary | ICD-10-CM

## 2012-04-23 MED ORDER — ESTRADIOL-NORETHINDRONE ACET 0.05-0.25 MG/DAY TD PTTW: 1.0000 | MEDICATED_PATCH | TRANSDERMAL | Status: DC

## 2012-04-23 MED ORDER — LIDOCAINE 5 % EX OINT: 1.0000 "application " | TOPICAL_OINTMENT | Freq: Three times a day (TID) | CUTANEOUS | Status: DC | PRN

## 2012-04-23 NOTE — Progress Notes (Signed)
Chloe Young Nov 25, 1951 [id number]        61 y.o. new patient for annual exam.  Former patient of Dr. Angeline Slim.  Past medical history,surgical history, medications, allergies, family history and social history were all reviewed and documented in the EPIC chart. ROS:  Was performed and pertinent positives and negatives are included in the history.  Exam: Sherrilyn Rist chaperone present Filed Vitals:   04/23/12 1117  BP: 140/82   General appearance  Normal Skin grossly normal Head/Neck normal with no cervical or supraclavicular adenopathy thyroid normal Lungs  clear Cardiac RR, without RMG Abdominal  soft, nontender, without masses, organomegaly or hernia Breasts  examined lying and sitting without masses, retractions, discharge or axillary adenopathy. Pelvic  Ext/BUS/vagina  normal with mild atrophic changes. Classic sebaceous cyst right lower labia majora  Cervix  normal    Uterus  anteverted, normal size, shape and contour, midline and mobile nontender   Adnexa  Without masses or tenderness    Anus and perineum  normal   Rectovaginal  normal sphincter tone without palpated masses or tenderness.    Assessment/Plan:  61 y.o. female for annual exam.    1. HRT. Patient is on CombiPatch 0.05-0.25. She's doing well wants to continue. No history of vaginal bleeding.  She started this for hot flashes/night sweats. She's tried weaning with return of symptoms. I reviewed the WHI study, increased risks of stroke heart attack DVT, increased risk of breast cancer. The ACOG and NAMS statements for lowest dose for shortest period of time. The patient understands accepts the risks and wants to continue and I refilled her times a year. 2. Osteopenia. DEXA 2005 showed T score -1.6.  Recommend repeat DEXA now and patient will schedule. Increased calcium and vitamin D reviewed. We'll check a baseline vitamin D today. 3. Pap smear. Last Pap smear 2012 with a negative high-risk HPV screen. I discussed current  screening guidelines we'll plan on repeat at a five-year interval. She has no history of abnormal Pap smears before with numerous normal reports in her chart. 4. Mammography. Patients do for mammogram now and knows to schedule this. SBE monthly reviewed. 5. Colonoscopy. Patient had colonoscopy 05/2010. She'll follow up with the recommended interval. 6. Health maintenance. She sees Dr. Alwyn Ren routinely and no blood work was done today other than the vitamin D level. Patient will follow up with me after her DEXA and then we'll go from there. Otherwise she will see me in a year.    Dara Lords MD, 12:00 PM 04/23/2012

## 2012-04-23 NOTE — Patient Instructions (Signed)
Follow up for bone density is scheduled. Otherwise return in one year for annual gynecologic exam. Report any vaginal bleeding.

## 2012-04-30 ENCOUNTER — Encounter: Payer: Self-pay | Admitting: Gynecology

## 2012-04-30 ENCOUNTER — Ambulatory Visit (INDEPENDENT_AMBULATORY_CARE_PROVIDER_SITE_OTHER): Payer: BC Managed Care – PPO

## 2012-04-30 DIAGNOSIS — M8580 Other specified disorders of bone density and structure, unspecified site: Secondary | ICD-10-CM

## 2012-04-30 DIAGNOSIS — M899 Disorder of bone, unspecified: Secondary | ICD-10-CM

## 2012-05-06 ENCOUNTER — Telehealth: Payer: Self-pay | Admitting: *Deleted

## 2012-05-06 ENCOUNTER — Telehealth: Payer: Self-pay | Admitting: Physical Medicine & Rehabilitation

## 2012-05-06 NOTE — Telephone Encounter (Signed)
Pt informed of normal Vit D results.

## 2012-05-06 NOTE — Telephone Encounter (Signed)
Needs Rx for Opana 10mg .  Trying to come off.

## 2012-05-06 NOTE — Telephone Encounter (Addendum)
Your last note in March said for her to call us if she wanted to wean down further to the Opana ER 10 mg, and she has called today.  SEE SCRIPT---ZTS

## 2012-05-07 MED ORDER — OXYMORPHONE HCL ER 10 MG PO TB12: 10.0000 mg | ORAL_TABLET | Freq: Two times a day (BID) | ORAL | Status: DC

## 2012-05-07 NOTE — Telephone Encounter (Signed)
I notified Chloe Young that her RX was available for pick up.

## 2012-05-15 ENCOUNTER — Telehealth: Payer: Self-pay | Admitting: Physical Medicine & Rehabilitation

## 2012-05-15 NOTE — Telephone Encounter (Signed)
The way Dr wrote [facility] makes it look like she is getting a 46 day supply.  She was charged more at pharmacy.  Please call [facility] and clarify this so she can be reimbursed.

## 2012-05-15 NOTE — Telephone Encounter (Signed)
Pt aware that rx is correct and we will not be able to contact her pharmacy to change. She agreed.

## 2012-05-28 ENCOUNTER — Encounter: Payer: Self-pay | Admitting: Internal Medicine

## 2012-05-28 ENCOUNTER — Ambulatory Visit (INDEPENDENT_AMBULATORY_CARE_PROVIDER_SITE_OTHER): Payer: BC Managed Care – PPO | Admitting: Internal Medicine

## 2012-05-28 VITALS — BP 124/80 | HR 75 | Temp 98.5°F | Resp 14 | Ht 63.5 in | Wt 148.0 lb

## 2012-05-28 DIAGNOSIS — R9431 Abnormal electrocardiogram [ECG] [EKG]: Secondary | ICD-10-CM

## 2012-05-28 DIAGNOSIS — Z0000 Encounter for general adult medical examination without abnormal findings: Secondary | ICD-10-CM

## 2012-05-28 DIAGNOSIS — E785 Hyperlipidemia, unspecified: Secondary | ICD-10-CM

## 2012-05-28 DIAGNOSIS — I10 Essential (primary) hypertension: Secondary | ICD-10-CM

## 2012-05-28 LAB — BASIC METABOLIC PANEL
BUN: 15 mg/dL (ref 6–23)
CO2: 27 mEq/L (ref 19–32)
Calcium: 9.5 mg/dL (ref 8.4–10.5)
Creatinine, Ser: 0.7 mg/dL (ref 0.4–1.2)
Glucose, Bld: 84 mg/dL (ref 70–99)

## 2012-05-28 LAB — HEPATIC FUNCTION PANEL
ALT: 22 U/L (ref 0–35)
AST: 32 U/L (ref 0–37)
Bilirubin, Direct: 0.1 mg/dL (ref 0.0–0.3)
Total Bilirubin: 0.7 mg/dL (ref 0.3–1.2)

## 2012-05-28 LAB — CBC WITH DIFFERENTIAL/PLATELET
Basophils Absolute: 0 10*3/uL (ref 0.0–0.1)
Eosinophils Absolute: 0.1 10*3/uL (ref 0.0–0.7)
Hemoglobin: 13.7 g/dL (ref 12.0–15.0)
Lymphocytes Relative: 28 % (ref 12.0–46.0)
MCHC: 32.8 g/dL (ref 30.0–36.0)
Neutro Abs: 2.9 10*3/uL (ref 1.4–7.7)
Platelets: 243 10*3/uL (ref 150.0–400.0)
RDW: 15.4 % — ABNORMAL HIGH (ref 11.5–14.6)

## 2012-05-28 LAB — TSH: TSH: 1.38 u[IU]/mL (ref 0.35–5.50)

## 2012-05-28 LAB — LIPID PANEL: Triglycerides: 70 mg/dL (ref 0.0–149.0)

## 2012-05-28 MED ORDER — METOPROLOL TARTRATE 50 MG PO TABS: ORAL_TABLET | ORAL | Status: DC

## 2012-05-28 NOTE — Progress Notes (Signed)
Subjective:    Patient ID: Chloe Young, female    DOB: 05/14/1951, 61 y.o.   MRN: [id number]  HPI  [name]  is here for a physical;acute issues include headaches in the left cervical area which radiate to the left occiput. These have increased as she is trying to wean her pain medications.      Review of Systems Attending the chronic pain clinic has been financially a burden; this is a than every other month and cost several hundred dollars at a minimum. As she has weaned the Opana 20 mg ER the headaches have increased in frequency. She denies any associated numbness, tingling, or weakness in extremities. Also she is not had incontinence of stool or urine.  Labs completed in the hospital in January of this year were reviewed. Her sodium ranged from 129 -134. Potassium ranged from 3.5-3.7. Glucose ranged from a low of 113 to high of 130. She is not on a diuretic such as HCTZ  She had a pulmonary angiography study performed postoperatively to rule out pulmonary emboli. This was done because of an elevated d-dimer. She does not remember complaining of chest pain or shortness of breath     Objective:   Physical Exam Gen.: Healthy and well-nourished in appearance. Alert, appropriate and cooperative throughout exam. Head: Normocephalic without obvious abnormalities Eyes: No corneal or conjunctival inflammation noted. Pupils equal round reactive to light and accommodation. Fundal exam is benign without hemorrhages, exudate, papilledema. Extraocular motion intact. Vision grossly normal. Minor unsustained, vertical nystagmus Ears: External  ear exam reveals no significant lesions or deformities. Canals clear .TMs normal. Hearing is grossly normal bilaterally. Nose: External nasal exam reveals no deformity or inflammation. Nasal mucosa are pink and moist. No lesions or exudates noted.  Mouth: Oral mucosa and oropharynx reveal no lesions or exudates. Teeth in good repair. Neck: No deformities,  masses, or tenderness noted. Range of motion decreased laterally. Thyroid normal Lungs: Normal respiratory effort; chest expands symmetrically. Lungs are clear to auscultation without rales, wheezes, or increased work of breathing. Heart: Normal rate and rhythm. Normal S1 and S2. No gallop, click, or rub. S4 w/o  murmur. Abdomen: Bowel sounds normal; abdomen soft and nontender. No masses, organomegaly or hernias noted. Genitalia: Dr. Audie Box, Gyn                                                                                   Musculoskeletal/extremities: No deformity or scoliosis noted of  the thoracic or lumbar spine. No clubbing, cyanosis, edema, or deformity noted. Range of motion  normal .Tone & strength  normal.Joints normal. Nail health  good. Vascular: Carotid, radial artery, dorsalis pedis and  posterior tibial pulses are full and equal. No bruits present. Neurologic: Alert and oriented x3. Deep tendon reflexes are normal except at the left knee.          Skin: Intact without suspicious lesions or rashes. Lymph: No cervical, axillary lymphadenopathy present. Psych: Mood and affect are normal. Normally interactive  Assessment & Plan:  #1 comprehensive physical exam; no acute findings #2 see Problem List with Assessments & Recommendations  #3 post cervical spine surgery chronic pain syndrome; she will be seeing Dr. Vear Clock  #4 mild hyponatremia and hyperglycemia while hospitalized Plan: see Orders

## 2012-05-28 NOTE — Patient Instructions (Signed)
Preventive Health Care: Exercise  30-45  minutes a day, 3-4 days a week. Walking is especially valuable in preventing Osteoporosis. Eat a low-fat diet with lots of fruits and vegetables, up to 7-9 servings per day. Consume less than 30 grams of sugar per day from foods & drinks with High Fructose Corn Syrup as # 1,2,3 or #4 on label. Health Care Power of Attorney & Living Will place you in charge of your health care  decisions. Verify these are  in place. Please try to go on My Chart within the next 24 hours to allow me to release the results directly to you. Share results with all MDs seen

## 2012-05-30 ENCOUNTER — Telehealth: Payer: Self-pay | Admitting: *Deleted

## 2012-05-30 NOTE — Telephone Encounter (Signed)
Patient states she was asked if SOB was a concern and she responded "No". Patient states she is feeling SOB but no pain. Patient thought the SOB was a results of 2 knee surgeries. Patient states she was at [employer] pushing a cart and felt SOB.   Family history 4 maternal Uncles with heart problem, 1 died of heart attack about 16 or 13.   Patient was told she needs an appointment to r/o heart related concerns. Patient states she has to work and is off on [employer] and Tuesday's. Patient would like for me to share this information with Dr.Hopper and get his advice.  Dr.Hopper please advise

## 2012-05-30 NOTE — Telephone Encounter (Signed)
Pt left msg on triage vmail stating she saw Dr. Alwyn Ren earlier in the week & had a couple of messages to get to him & would like a call back. No other information was provided.

## 2012-05-30 NOTE — Telephone Encounter (Signed)
Spoke with patient, patient aware to be seen in ER or urgent care if symptoms persistent or worse (Per Dr.Hopper), otherwise appointment scheduled for Tuesday per patient request due to work schedule

## 2012-05-30 NOTE — Telephone Encounter (Signed)
Unfortunately the only symptom women may have with heart disease is fatigue or shortness of breath. I recommend an office visit followup as a prelude to possible stress testing if indicated clinically

## 2012-06-04 ENCOUNTER — Encounter: Payer: Self-pay | Admitting: Internal Medicine

## 2012-06-04 ENCOUNTER — Ambulatory Visit (INDEPENDENT_AMBULATORY_CARE_PROVIDER_SITE_OTHER): Payer: BC Managed Care – PPO | Admitting: Internal Medicine

## 2012-06-04 VITALS — BP 150/90 | HR 77 | Temp 98.8°F | Wt 149.0 lb

## 2012-06-04 DIAGNOSIS — R0602 Shortness of breath: Secondary | ICD-10-CM

## 2012-06-04 DIAGNOSIS — E785 Hyperlipidemia, unspecified: Secondary | ICD-10-CM

## 2012-06-04 DIAGNOSIS — Z2911 Encounter for prophylactic immunotherapy for respiratory syncytial virus (RSV): Secondary | ICD-10-CM

## 2012-06-04 DIAGNOSIS — R9431 Abnormal electrocardiogram [ECG] [EKG]: Secondary | ICD-10-CM

## 2012-06-04 DIAGNOSIS — R030 Elevated blood-pressure reading, without diagnosis of hypertension: Secondary | ICD-10-CM

## 2012-06-04 DIAGNOSIS — Z0000 Encounter for general adult medical examination without abnormal findings: Secondary | ICD-10-CM

## 2012-06-04 MED ORDER — PRAVASTATIN SODIUM 20 MG PO TABS: 20.0000 mg | ORAL_TABLET | Freq: Every day | ORAL | Status: DC

## 2012-06-04 NOTE — Assessment & Plan Note (Signed)
Based on the advanced cholesterol testing; her minimal LDL goal would be less than 140. Statin therapy would be recommended in view of the present symptoms

## 2012-06-04 NOTE — Progress Notes (Signed)
Addended by: Candie Echevaria L on: 06/04/2012 12:33 PM   Modules accepted: Orders

## 2012-06-04 NOTE — Progress Notes (Signed)
  Subjective:    Patient ID: Chloe Young, female    DOB: 01-09-1951, 61 y.o.   MRN: [id number]  HPI She called 05/30/12 describing exertional dyspnea walking up incline. She also had some shortness of breath pushing a cart at the store. She has been walking 20-30 minutes twice a day; she has dyspnea each time she tries to go up incline.   On 01/13/12, one day post total knee replacement, EKG revealed tachycardia with rate up to 127. D-dimer had been elevated but CT angiogram was negative for pulmonary emboli  4 maternal uncles had heart disease; one out of heart attack in his late 67s. One maternal uncle had a stroke in his 89s.      Review of Systems Labs 05/28/12 revealed an LDL of 172.6, triglycerides 70, and HDL 77.2. She has had fasting hyperglycemia but her A1c was 5.6% on 6/4     Objective:   Physical Exam She appears healthy and well-nourished; she is in no acute distress  No carotid bruits are present.  Heart rhythm and rate are normal without significant murmurs or gallops.  Chest is clear with no increased work of breathing  There is no evidence of aortic aneurysm or renal artery bruits  Shee has no clubbing or edema. Homans sign is negative bilaterally.  There is crepitus in fusiform changes in both knees with decreased range of motion   Pedal pulses are intact   No ischemic skin changes are present         Assessment & Plan:  #1 dyspnea walking up an incline; rule out subclinical coronary disease  #2 dyslipidemia, LDL not at goal of at least less than 140  Plan: Cardiology consult will be pursued; optimal stress assessment will be determined. Her orthopedic issues may prevent a routine stress test.

## 2012-06-04 NOTE — Patient Instructions (Signed)
Please  schedule fasting Labs in 10-11 weeks: CK,Lipids, hepatic panel. PLEASE BRING THESE INSTRUCTIONS TO FOLLOW UP  LAB APPOINTMENT.This will guarantee correct labs are drawn, eliminating need for repeat blood sampling ( needle sticks ! ). Diagnoses /Codes:272.4,995.20

## 2012-06-04 NOTE — Assessment & Plan Note (Signed)
06/04/12 EKG reveals poor R-wave progression V1-3 without ischemic changes.

## 2012-06-19 ENCOUNTER — Telehealth: Payer: Self-pay | Admitting: *Deleted

## 2012-06-19 NOTE — Telephone Encounter (Signed)
Patient having side effects: pain & weakness in in leg muscles and joints [post having TKR(s), patient does not want to continue w/med]; informed patient to D/C medication and that she will receive call back when we have further instructions from MD as to how he would like to proceed w/her treatment. Patient agreed & understood/SLS Please advise.

## 2012-06-19 NOTE — Telephone Encounter (Signed)
Based on the advanced cholesterol testing; her long-term risk of premature heart attack or stroke is increased approximately 33%.Please consider these facts & see me if you wish to address your risk. The best dietary  information on cholesterol is Dr Gildardo Griffes book Eat, Drink & Be Healthy.

## 2012-06-20 NOTE — Telephone Encounter (Signed)
She should take a copy of the advanced cholesterol testing (NMR LipoProfile) to the cardiology visit. This allows optimal assessment of options and risks.

## 2012-06-20 NOTE — Telephone Encounter (Signed)
Left message to call office

## 2012-06-20 NOTE — Telephone Encounter (Signed)
Discuss with patient who states that she sees cardiologist in 2 week and will discuss this issue with them. Pt notes in the mean time she will D/C med until she see them.

## 2012-06-21 NOTE — Telephone Encounter (Signed)
Patient aware of Dr.Hopper's response, patient would like a copy of NMR mailed to her. Report mailed to home address (verified with patient)

## 2012-07-02 ENCOUNTER — Ambulatory Visit (INDEPENDENT_AMBULATORY_CARE_PROVIDER_SITE_OTHER): Payer: BC Managed Care – PPO | Admitting: Cardiovascular Disease

## 2012-07-02 ENCOUNTER — Encounter: Payer: Self-pay | Admitting: Cardiovascular Disease

## 2012-07-02 VITALS — BP 147/93 | HR 86 | Ht 63.0 in | Wt 150.0 lb

## 2012-07-02 DIAGNOSIS — R0609 Other forms of dyspnea: Secondary | ICD-10-CM

## 2012-07-02 DIAGNOSIS — R079 Chest pain, unspecified: Secondary | ICD-10-CM | POA: Insufficient documentation

## 2012-07-02 DIAGNOSIS — R9431 Abnormal electrocardiogram [ECG] [EKG]: Secondary | ICD-10-CM

## 2012-07-02 DIAGNOSIS — R0600 Dyspnea, unspecified: Secondary | ICD-10-CM

## 2012-07-02 NOTE — Assessment & Plan Note (Signed)
She has exertional chest pains with dyspnea. Her risk factors for CAD include HTN, HLD and family history of premature CAD. She has no cardiac history or prior cardiac workup. 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 I think stress testing is indicated. She would like to try to walk on the treadmill. I think a myoview is indicated given her baseline EKG changes and clinical presentation.

## 2012-07-02 NOTE — Progress Notes (Signed)
History of Present Illness: 61 yo white female with history of OA, HTN who is referred today for evaluation of an abnormal EKG and dyspnea. She was seen by Dr. Alwyn Ren last month and had the same complaints. She tells me that she has had dyspnea since her knee surgery in January 2013. This is most noticeable when walking up hills. She also notices chest pressure when she is walking. This resolves with rest. Her EKG shows non-specific changes with possible old inferior infarct and poor R wave progression. She denies any palpitations but is aware of her heart beating heavy. No near syncope or syncope. She has never been a smoker. No prior cardiac issues.   Primary Care Physician: Marga Melnick  Past Medical History  Diagnosis Date  . Cervical post-laminectomy syndrome     Dr Hermelinda [facility]  . Cervical facet syndrome   . Cervicalgia   . Chronic pain syndrome   . Osteoarthritis, localized, knee     OA AND PAIN LEFT KNEE  -- S/P RIGHT  KNEE ARTHROPLASTY ON 82012--KNEE IS DOING WELL.  Marland Kitchen Hypertension   . Osteopenia 04/2012    T score -1.2; Dr Audie Box, [facility]  . Hyperlipidemia     Past Surgical History  Procedure Date  . Cervial fusion 2006    Dr Lovell Sheehan, [place]  . Knee arthroscopy     bilaterally; Dr Shelle Iron  . Joint replacement AUG 2012     RIGHT KNEE ARTHROPLASTY  . 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 Calcium Carbonate-Vitamin D (CALCIUM + D PO) Take 1 tablet by mouth daily.       . cyclobenzaprine (AMRIX) 15 MG 24 hr capsule Take 15 mg by mouth daily as needed. Muscle spasm       . estradiol-norethindrone (COMBIPATCH) 0.05-0.25 MG/DAY Place 1 patch onto the skin 2 (two) times a week.  8 patch  11  . lidocaine (XYLOCAINE) 5 % ointment Apply 1  application topically 3 (three) times daily as needed.  70.88 g  1  . meloxicam (MOBIC) 15 MG tablet As needed      . metoprolol (LOPRESSOR) 50 MG tablet 1/2 bid  90 tablet  3  . Multiple Vitamin (MULITIVITAMIN WITH MINERALS) TABS Take 1 tablet by mouth daily.       Marland Kitchen omeprazole (PRILOSEC) 20 MG capsule Take 20 mg by mouth daily.      Marland Kitchen oxyCODONE (ROXICODONE) 15 MG immediate release tablet As needed        No Known Allergies  History   Social History  . Marital Status: Married    Spouse Name: N/A    Number of Children: 1  . Years of Education: N/A   Occupational History  . HAIRDRESSER    Social History Main Topics  . Smoking status: Never Smoker   . Smokeless tobacco: Never Used  . Alcohol Use: 8.4 oz/week    14 Shots of liquor per week     2 DRINKS PER NIGHT  . Drug Use: No  . Sexually Active: Yes    Birth Control/ Protection: Post-menopausal   Other Topics Concern  . Not on file   Social History Narrative  . No narrative on file    Family History  Problem Relation Age of Onset  .  Diverticulitis Mother 18    with fissures.Marland Kitchenopted for no surgery  . Cancer Father 52     malignant brain tumor  . Breast cancer Maternal Aunt   . Diabetes Maternal Grandfather   . Stroke Maternal Uncle     in 46s  . Heart disease Maternal Uncle     X4 ; 1 had MI @ 50    Review of Systems:  As stated in the HPI and otherwise negative.   BP 147/93  Pulse 86  Ht 5\' 3"  (1.6 m)  Wt 150 lb (68.04 kg)  BMI 26.57 kg/m2  Physical Examination: General: Well developed, well nourished, NAD HEENT: OP clear, mucus membranes moist SKIN: warm, dry. No rashes. Neuro: No focal deficits Musculoskeletal: Muscle strength 5/5 all ext Psychiatric: Mood and affect normal Neck: No JVD, no carotid bruits, no thyromegaly, no lymphadenopathy. Lungs:Clear bilaterally, no wheezes, rhonci, crackles Cardiovascular: Regular rate and rhythm. No murmurs, gallops or rubs. Abdomen:Soft. Bowel sounds  present. Non-tender.  Extremities: No lower extremity edema. Pulses are 2 + in the bilateral DP/PT.  EKG: NSR, rate 88 bpm. Inferior changes suggesting possible old inferior infarct. Poor R wave progression through the precordial leads.

## 2012-07-02 NOTE — Assessment & Plan Note (Addendum)
Will get echocardiogram to assess LV systolic and diastolic function, exclude structural heart disease. Negative CTA chest following surgery in January which excluded large PE.

## 2012-07-02 NOTE — Patient Instructions (Signed)
Your physician recommends that you schedule a follow-up appointment in: 3-4 weeks.   Your physician has requested that you have an echocardiogram. Echocardiography is a painless test that uses sound waves to create images of your heart. It provides your doctor with information about the size and shape of your heart and how well your heart's chambers and valves are working. This procedure takes approximately one hour. There are no restrictions for this procedure.   Your physician has requested that you have an exercise stress myoview. For further information please visit [email or web address]. Please follow instruction sheet, as given.   

## 2012-07-02 NOTE — Assessment & Plan Note (Signed)
See above

## 2012-07-10 ENCOUNTER — Ambulatory Visit (HOSPITAL_COMMUNITY): Payer: BC Managed Care – PPO | Attending: Cardiovascular Disease | Admitting: Radiology

## 2012-07-10 VITALS — Ht 63.0 in | Wt 152.0 lb

## 2012-07-10 DIAGNOSIS — I10 Essential (primary) hypertension: Secondary | ICD-10-CM | POA: Insufficient documentation

## 2012-07-10 DIAGNOSIS — R5383 Other fatigue: Secondary | ICD-10-CM | POA: Insufficient documentation

## 2012-07-10 DIAGNOSIS — R0789 Other chest pain: Secondary | ICD-10-CM | POA: Insufficient documentation

## 2012-07-10 DIAGNOSIS — Z8249 Family history of ischemic heart disease and other diseases of the circulatory system: Secondary | ICD-10-CM | POA: Insufficient documentation

## 2012-07-10 DIAGNOSIS — R0600 Dyspnea, unspecified: Secondary | ICD-10-CM

## 2012-07-10 DIAGNOSIS — R5381 Other malaise: Secondary | ICD-10-CM | POA: Insufficient documentation

## 2012-07-10 DIAGNOSIS — R0609 Other forms of dyspnea: Secondary | ICD-10-CM | POA: Insufficient documentation

## 2012-07-10 DIAGNOSIS — E785 Hyperlipidemia, unspecified: Secondary | ICD-10-CM | POA: Insufficient documentation

## 2012-07-10 DIAGNOSIS — R9431 Abnormal electrocardiogram [ECG] [EKG]: Secondary | ICD-10-CM

## 2012-07-10 DIAGNOSIS — R0602 Shortness of breath: Secondary | ICD-10-CM

## 2012-07-10 DIAGNOSIS — R0989 Other specified symptoms and signs involving the circulatory and respiratory systems: Secondary | ICD-10-CM | POA: Insufficient documentation

## 2012-07-10 DIAGNOSIS — R079 Chest pain, unspecified: Secondary | ICD-10-CM

## 2012-07-10 MED ORDER — TECHNETIUM TC 99M TETROFOSMIN IV KIT
11.0000 | PACK | Freq: Once | INTRAVENOUS | Status: AC | PRN
  Administered 2012-07-10: 11 via INTRAVENOUS

## 2012-07-10 MED ORDER — TECHNETIUM TC 99M TETROFOSMIN IV KIT
33.0000 | PACK | Freq: Once | INTRAVENOUS | Status: AC | PRN
  Administered 2012-07-10: 33 via INTRAVENOUS

## 2012-07-10 NOTE — Progress Notes (Signed)
[facility] SITE 3 NUCLEAR MED [address] [place] [address] [phone]  Cardiology Nuclear Med Study  Chloe Young is a 61 y.o. female     MRN : [id number]     DOB: 03-25-1951  Procedure Date: 07/10/2012  Nuclear Med Background Indication for Stress Test:  Evaluation for Ischemia and Abnormal EKG History:  No prior known history of CAD, and 01/12/12 Echo: EF=55% Cardiac Risk Factors: Family History - CAD, Hypertension and Lipids  Symptoms: Chest Tightness/Pressure with/without exertion (last occurrence 4 days ago),  DOE, Fatigue and Fatigue with Exertion   Nuclear Pre-Procedure Caffeine/Decaff Intake:  None NPO After: 8:00pm   Lungs:  Clear, coarse breath sounds O2 Sat: 97%% on room air. IV 0.9% NS with Angio Cath:  22g  IV Site: R Forearm  IV Started by:  Stanton Kidney, EMT-P  Chest Size (in):  36 Cup Size: B  Height: 5\' 3"  (1.6 m)  Weight:  152 lb (68.947 kg)  BMI:  Body mass index is 26.93 kg/(m^2). Tech Comments:  Metoprolol held 13 hours, per patient.    Nuclear Med Study 1 or 2 day study: 1 day  Stress Test Type:  Stress  Reading MD: Marca Ancona, MD  Order Authorizing Provider:  Verne Carrow, MD  Resting Radionuclide: Technetium 89m Tetrofosmin  Resting Radionuclide Dose: 11.0 mCi   Stress Radionuclide:  Technetium 57m Tetrofosmin  Stress Radionuclide Dose: 33.0 mCi           Stress Protocol Rest HR: 82 Stress HR: 166  Rest BP: 138/93 Stress BP: 193/101  Exercise Time (min): 3:00 METS: 4.6   Predicted Max HR: 158 bpm % Max HR: 105.06 bpm Rate Pressure Product: 21308   Dose of Adenosine (mg):  n/a Dose of Lexiscan: n/a mg  Dose of Atropine (mg): n/a Dose of Dobutamine: n/a mcg/kg/min (at max HR)  Stress Test Technologist: Irean Hong, RN  Nuclear Technologist:  Doyne Keel, CNMT     Rest Procedure:  Myocardial perfusion imaging was performed at rest 45 minutes following the intravenous administration of Technetium 58m  Tetrofosmin. Rest ECG: NSR with nonspecific T wave changes  Stress Procedure:  The patient performed treadmill exercise using a Bruce  Protocol for 3 minutes, RPE 17, with rapid heart rate response to exercise. The patient stopped due to DOE and complained of chest tightness 5-6/10 in recovery. There were nonspecific ST-T wave changes.  There was a mild hypertensive response to exercise.Technetium 15m Tetrofosmin was injected at peak exercise and myocardial perfusion imaging was performed after a brief delay. Discussed rapid HR response and chest tightness with Dr. Johney Frame with preliminary images reviewed. The patient asymptomatic now. Dr. Johney Frame recommended the patient keep echo test appointment next week and follow up with Dr. Alyson Ingles. Stress ECG: No significant change from baseline ECG  QPS Raw Data Images:  Normal; no motion artifact; normal heart/lung ratio. Stress Images:  Normal homogeneous uptake in all areas of the myocardium. Rest Images:  Normal homogeneous uptake in all areas of the myocardium. Subtraction (SDS):  There is no evidence of scar or ischemia. Transient Ischemic Dilatation (Normal <1.22):  0.72 Lung/Heart Ratio (Normal <0.45):  0.23  Quantitative Gated Spect Images QGS EDV:  40 ml QGS ESV:  10 ml  Impression Exercise Capacity:  Poor exercise capacity. BP Response:  Hypertensive blood pressure response. Clinical Symptoms:  Short of breath.  Chest tightness in recovery.  ECG Impression:  No significant ST segment change suggestive of ischemia. Comparison  with Prior Nuclear Study: No previous nuclear study performed  Overall Impression:  Normal stress nuclear study.  LV Ejection Fraction: 75%.  LV Wall Motion:  NL LV Function; NL Wall Motion  Marca Ancona 07/10/2012

## 2012-07-11 ENCOUNTER — Telehealth: Payer: Self-pay | Admitting: Cardiovascular Disease

## 2012-07-11 ENCOUNTER — Telehealth: Payer: Self-pay | Admitting: *Deleted

## 2012-07-11 NOTE — Telephone Encounter (Signed)
New msg Pt wants to know test results from yesterday

## 2012-07-11 NOTE — Telephone Encounter (Signed)
Had Chloe Fiscal gerhardt np review stress test, normal results given to pt. Echo set next week, f/u with dr set.

## 2012-07-11 NOTE — Telephone Encounter (Signed)
Pt called, informed her we are aware of her need for results from stress test and we will call her back later in day when reviewed, pt agreeable to plan.

## 2012-07-11 NOTE — Telephone Encounter (Signed)
Pt left VM that when she was in the office she had a abnormal EKG and has since been scheduled to have a stress test and Echo. Pt would like to know if she still needs to have the Echo done since the stress test was negative. And if no is there some other isuse that we need to be looking at. Please advise

## 2012-07-11 NOTE — Telephone Encounter (Signed)
Dr Clifton James will decide whether this needs to be completed

## 2012-07-11 NOTE — Telephone Encounter (Signed)
Left message to call office

## 2012-07-12 NOTE — Telephone Encounter (Signed)
Discuss with patient  

## 2012-07-12 NOTE — Telephone Encounter (Signed)
Echo is still needed. Can we call her to arrange this? Thanks, chris

## 2012-07-12 NOTE — Telephone Encounter (Signed)
Echo scheduled for 07/16/2012 at 10:30.

## 2012-07-16 ENCOUNTER — Ambulatory Visit (HOSPITAL_COMMUNITY): Payer: BC Managed Care – PPO | Attending: Cardiovascular Disease | Admitting: Radiology

## 2012-07-16 DIAGNOSIS — I10 Essential (primary) hypertension: Secondary | ICD-10-CM | POA: Insufficient documentation

## 2012-07-16 DIAGNOSIS — E785 Hyperlipidemia, unspecified: Secondary | ICD-10-CM | POA: Insufficient documentation

## 2012-07-16 DIAGNOSIS — I7300 Raynaud's syndrome without gangrene: Secondary | ICD-10-CM | POA: Insufficient documentation

## 2012-07-16 DIAGNOSIS — R0989 Other specified symptoms and signs involving the circulatory and respiratory systems: Secondary | ICD-10-CM | POA: Insufficient documentation

## 2012-07-16 DIAGNOSIS — I359 Nonrheumatic aortic valve disorder, unspecified: Secondary | ICD-10-CM | POA: Insufficient documentation

## 2012-07-16 DIAGNOSIS — I079 Rheumatic tricuspid valve disease, unspecified: Secondary | ICD-10-CM | POA: Insufficient documentation

## 2012-07-16 DIAGNOSIS — R0609 Other forms of dyspnea: Secondary | ICD-10-CM | POA: Insufficient documentation

## 2012-07-16 DIAGNOSIS — R079 Chest pain, unspecified: Secondary | ICD-10-CM | POA: Insufficient documentation

## 2012-07-16 NOTE — Progress Notes (Signed)
Echocardiogram performed.  

## 2012-07-17 ENCOUNTER — Telehealth: Payer: Self-pay | Admitting: Cardiovascular Disease

## 2012-07-17 NOTE — Telephone Encounter (Signed)
lmom ith echo results

## 2012-07-17 NOTE — Telephone Encounter (Signed)
Pt requesting results of echo

## 2012-08-07 ENCOUNTER — Encounter (INDEPENDENT_AMBULATORY_CARE_PROVIDER_SITE_OTHER): Payer: BC Managed Care – PPO

## 2012-08-07 ENCOUNTER — Encounter: Payer: Self-pay | Admitting: Cardiovascular Disease

## 2012-08-07 ENCOUNTER — Ambulatory Visit (INDEPENDENT_AMBULATORY_CARE_PROVIDER_SITE_OTHER): Payer: BC Managed Care – PPO | Admitting: Cardiovascular Disease

## 2012-08-07 VITALS — BP 159/99 | HR 80 | Ht 63.0 in | Wt 157.0 lb

## 2012-08-07 DIAGNOSIS — R002 Palpitations: Secondary | ICD-10-CM

## 2012-08-07 DIAGNOSIS — R0609 Other forms of dyspnea: Secondary | ICD-10-CM

## 2012-08-07 DIAGNOSIS — R079 Chest pain, unspecified: Secondary | ICD-10-CM

## 2012-08-07 DIAGNOSIS — R0600 Dyspnea, unspecified: Secondary | ICD-10-CM

## 2012-08-07 NOTE — Assessment & Plan Note (Signed)
No ischemia on stress test. No further ischemic workup.

## 2012-08-07 NOTE — Patient Instructions (Signed)
Your physician wants you to follow-up in: 6 months.  You will receive a reminder letter in the mail two months in advance. If you don't receive a letter, please call our office to schedule the follow-up appointment.  Your physician has recommended that you wear a holter monitor. Holter monitors are medical devices that record the heart's electrical activity. Doctors most often use these monitors to diagnose arrhythmias. Arrhythmias are problems with the speed or rhythm of the heartbeat. The monitor is a small, portable device. You can wear one while you do your normal daily activities. This is usually used to diagnose what is causing palpitations/syncope (passing out).   

## 2012-08-07 NOTE — Assessment & Plan Note (Signed)
May be related to arrythmia. LV function normal.

## 2012-08-07 NOTE — Assessment & Plan Note (Signed)
Will arrange 48 hour holter monitor to rule out arrythmias. Will continue beta blocker for now. Will increase if she has evidence of SVT or atrial arrythmias.

## 2012-08-07 NOTE — Progress Notes (Signed)
History of Present Illness: 61 yo white female with history of OA, HTN who is here today for cardiac follow up. I saw her as a new patient JUly 2013. She was referred today for evaluation of an abnormal EKG and dyspnea. She tells me that she has had dyspnea since her knee surgery in January 2013. This is most noticeable when walking up hills. She also notices chest pressure when she is walking. This resolves with rest. Her EKG showed non-specific changes with possible old inferior infarct and poor R wave progression. She denies any palpitations but is aware of her heart beating heavy. No near syncope or syncope. She has never been a smoker. No prior cardiac issues. I arranged a exercise myoview which showed no ischemia or EKG changes but she did have dyspnea and chest pain with exercise. She had a rapid heart rate response to exercise but this was sinus tachycardia. Echo was overall normal.   He is here today for follow up. She continues to have palpitations at night and her heart rate has been as high as 130 laying bed. She does feel the chest heaviness and SOB when her heart rate is up.  Primary Care Physician: Marga Melnick  Last Lipid Profile:  Lipid Panel     Component Value Date/Time   CHOL 247* 05/28/2012 1050   TRIG 70.0 05/28/2012 1050   HDL 77.20 05/28/2012 1050   CHOLHDL 3 05/28/2012 1050   VLDL 14.0 05/28/2012 1050     Past Medical History  Diagnosis Date  . Cervical post-laminectomy syndrome     Dr Hermelinda Medicus[facility]  . Cervical facet syndrome   . Cervicalgia   . Chronic pain syndrome   . Osteoarthritis, localized, knee     OA AND PAIN LEFT KNEE  -- S/P RIGHT  KNEE ARTHROPLASTY ON 82012--KNEE IS DOING WELL.  Marland Kitchen Hypertension   . Osteopenia 04/2012    T score -1.2; Dr Audie Box, [facility]  . Hyperlipidemia     Past Surgical History  Procedure Date  . Cervial fusion 2006    Dr Lovell Sheehan, [place]  . Knee arthroscopy     bilaterally; Dr Shelle Iron  . Joint replacement AUG 2012     RIGHT  KNEE ARTHROPLASTY  . 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 Calcium Carbonate-Vitamin D (CALCIUM + D PO) Take 1 tablet by mouth daily.       . cyclobenzaprine (AMRIX) 15 MG 24 hr capsule Take 15 mg by mouth daily as needed. Muscle spasm       . estradiol-norethindrone (COMBIPATCH) 0.05-0.25 MG/DAY Place 1 patch onto the skin 2 (two) times a week.  8 patch  11  . lidocaine (XYLOCAINE) 5 % ointment Apply 1 application topically 3 (three) times daily as needed.  70.88 g  1  . meloxicam (MOBIC) 15 MG tablet As needed      . metoprolol (LOPRESSOR) 50 MG tablet 1/2 bid  90 tablet  3  . Multiple Vitamin (MULITIVITAMIN WITH MINERALS) TABS Take 1 tablet by mouth daily.       Marland Kitchen omeprazole (PRILOSEC) 20 MG capsule Take 20 mg by mouth daily.      Marland Kitchen oxyCODONE (ROXICODONE) 15 MG immediate release tablet As needed        No  Known Allergies  History   Social History  . Marital Status: Married    Spouse Name: N/A    Number of Children: 1  . Years of Education: N/A   Occupational History  . HAIRDRESSER    Social History Main Topics  . Smoking status: Never Smoker   . Smokeless tobacco: Never Used  . Alcohol Use: 8.4 oz/week    14 Shots of liquor per week     2 DRINKS PER NIGHT  . Drug Use: No  . Sexually Active: Yes    Birth Control/ Protection: Post-menopausal   Other Topics Concern  . Not on file   Social History Narrative  . No narrative on file    Family History  Problem Relation Age of Onset  . Diverticulitis Mother 37    with fissures.Marland Kitchenopted for no surgery  . Cancer Father 69     malignant brain tumor  . Breast cancer Maternal Aunt   . Diabetes Maternal Grandfather   . Stroke Maternal Uncle     in 14s  . Heart disease Maternal Uncle     X4 ;  1 had MI @ 89    Review of Systems:  As stated in the HPI and otherwise negative.   BP 159/99  Pulse 80  Ht 5\' 3"  (1.6 m)  Wt 157 lb (71.215 kg)  BMI 27.81 kg/m2  Physical Examination: General: Well developed, well nourished, NAD HEENT: OP clear, mucus membranes moist SKIN: warm, dry. No rashes. Neuro: No focal deficits Musculoskeletal: Muscle strength 5/5 all ext Psychiatric: Mood and affect normal Neck: No JVD, no carotid bruits, no thyromegaly, no lymphadenopathy. Lungs:Clear bilaterally, no wheezes, rhonci, crackles Cardiovascular: Regular rate and rhythm. No murmurs, gallops or rubs. Abdomen:Soft. Bowel sounds present. Non-tender.  Extremities: No lower extremity edema. Pulses are 2 + in the bilateral DP/PT.  Echo 07/16/12:  Left ventricle: The cavity size was normal. Wall thickness was normal. Systolic function was normal. The estimated ejection fraction was in the range of 55% to 60%. There was an increased relative contribution of atrial contraction to ventricular filling. - Aortic valve: Trivial regurgitation.  Exercise myoview: 07/10/12:  Stress Procedure: The patient performed treadmill exercise using a Bruce Protocol for 3 minutes, RPE 17, with rapid heart rate response to exercise. The patient stopped due to DOE and complained of chest tightness 5-6/10 in recovery. There were nonspecific ST-T wave changes. There was a mild hypertensive response to exercise.Technetium 82m Tetrofosmin was injected at peak exercise and myocardial perfusion imaging was performed after a brief delay. Discussed rapid HR response and chest tightness with Dr. Johney Frame with preliminary images reviewed. The patient asymptomatic now. Dr. Johney Frame recommended the patient keep echo test appointment next week and follow up with Dr. Alyson Ingles.  Stress ECG: No significant change from baseline ECG  QPS  Raw Data Images: Normal; no motion artifact; normal heart/lung ratio.  Stress Images: Normal  homogeneous uptake in all areas of the myocardium.  Rest Images: Normal homogeneous uptake in all areas of the myocardium.  Subtraction (SDS): There is no evidence of scar or ischemia.  Transient Ischemic Dilatation (Normal <1.22): 0.72  Lung/Heart Ratio (Normal <0.45): 0.23  Quantitative Gated Spect Images  QGS EDV: 40 ml  QGS ESV: 10 ml  Impression  Exercise Capacity: Poor exercise capacity.  BP Response: Hypertensive blood pressure response.  Clinical Symptoms: Short of breath. Chest tightness in recovery.  ECG Impression: No significant ST segment change suggestive of ischemia.  Comparison with Prior Nuclear  Study: No previous nuclear study performed  Overall Impression: Normal stress nuclear study.  LV Ejection Fraction: 75%. LV Wall Motion: NL LV Function; NL Wall Motion

## 2012-11-06 ENCOUNTER — Telehealth: Payer: Self-pay

## 2012-11-06 NOTE — Telephone Encounter (Signed)
Spoke with patient, patient was calling to see if she had a vaccine that protect against pertussis for she is expecting a grand-baby in 01/2013.  I reviewed patient's chart in [facility] (Old EMR system) and she had the TDaP (which protects against pertussis) on 04/19/2009 @ [facility] appointment. Patient verbalized understanding.

## 2012-12-20 IMAGING — CT CT ANGIO CHEST
2 of 6 series · 19 of 36 positions shown · IV contrast (APPLIED)
Comparison: Chest radiographs dated 07/24/2011

CLINICAL DATA: Elevated D-dimer, postop total knee, evaluate for PE

CT ANGIOGRAPHY CHEST
TECHNIQUE: Multidetector CT imaging of the chest using the
standard protocol during bolus administration of intravenous
contrast. Multiplanar reconstructed images including MIPs were
obtained and reviewed to evaluate the vascular anatomy.
Contrast: 100mL OMNIPAQUE IOHEXOL 300 MG/ML IV SOLN

[Series 6: pe thins @ 1mm · axial · 0.61mm/px · z∈[-22,+176]mm · 18 of 220 slices shown]
[im 11/220  lung]
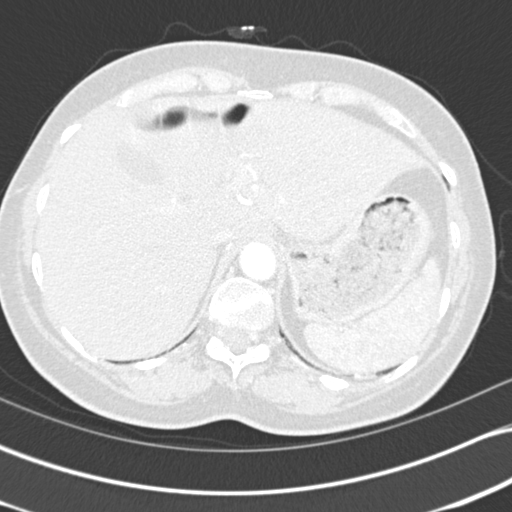
[im 22/220  mediastinal]
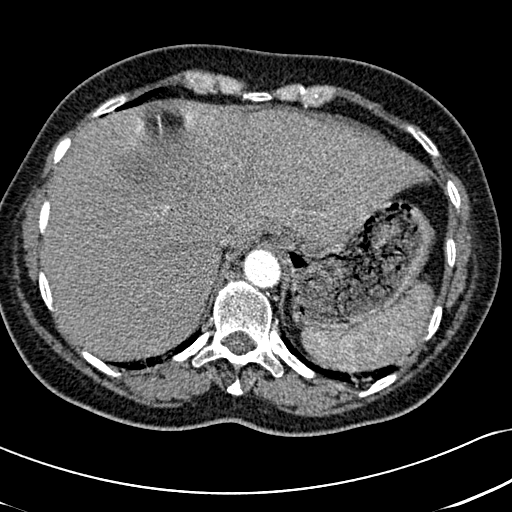
[im 33/220  lung]
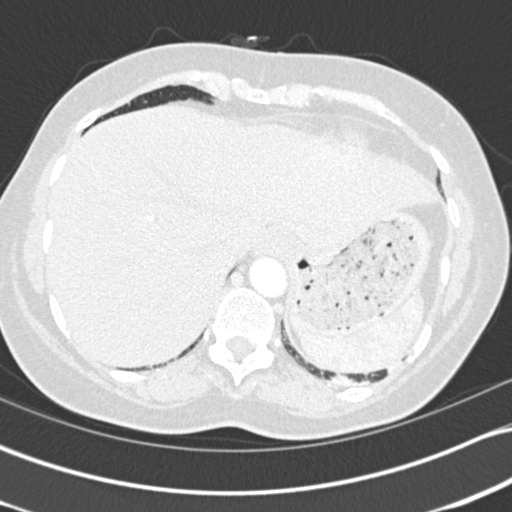
[im 44/220  mediastinal]
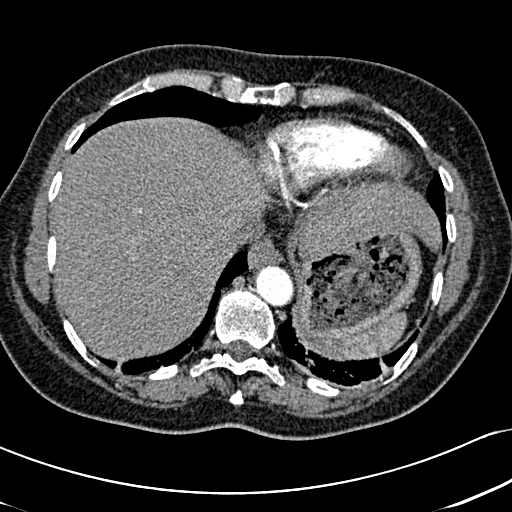
[im 55/220  lung]
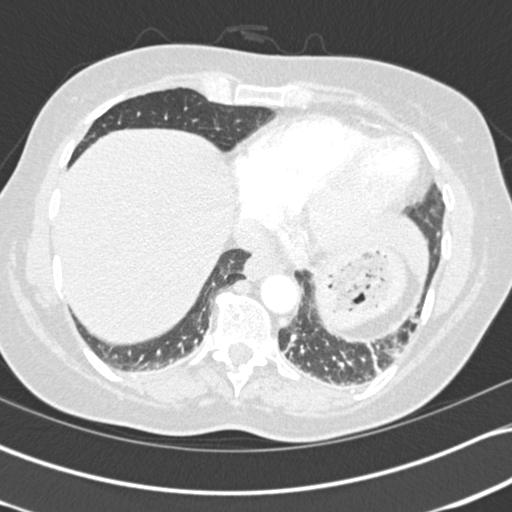
[im 66/220  mediastinal]
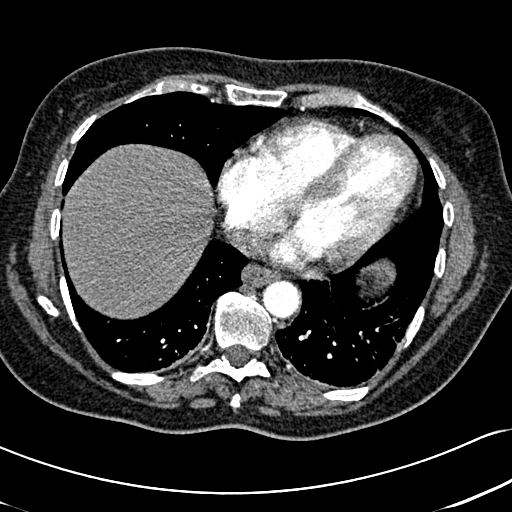
[im 77/220  lung]
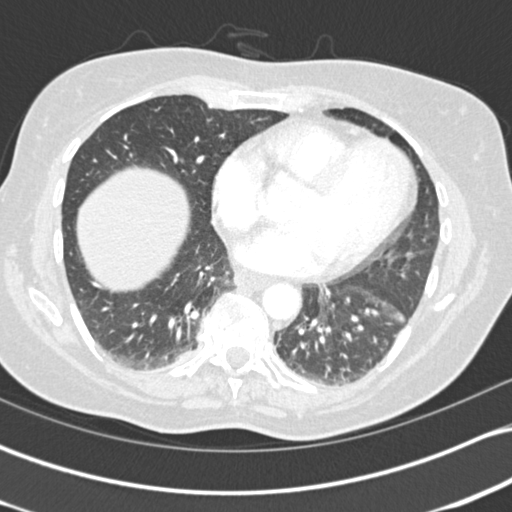
[im 88/220  mediastinal]
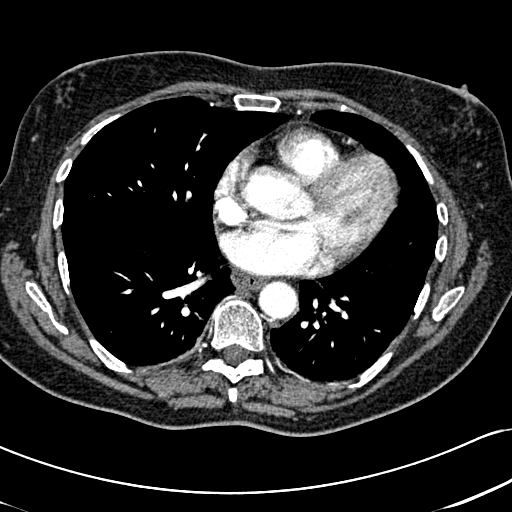
[im 99/220  lung]
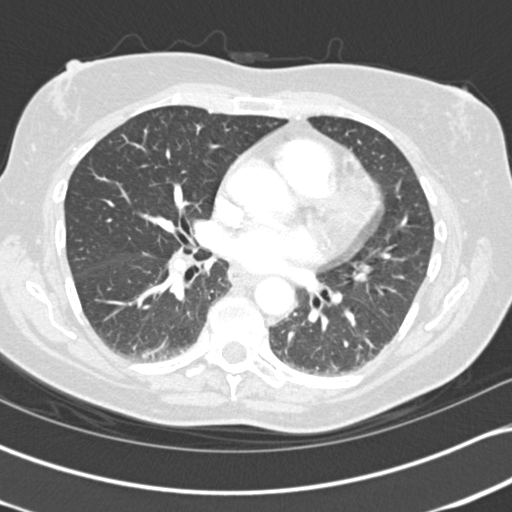
[im 121/220  mediastinal]
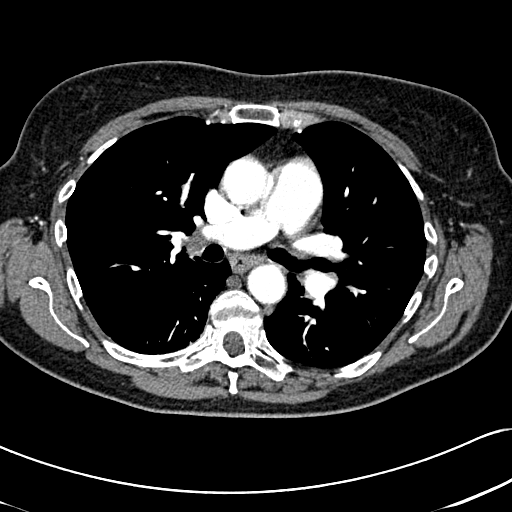
[im 132/220  lung]
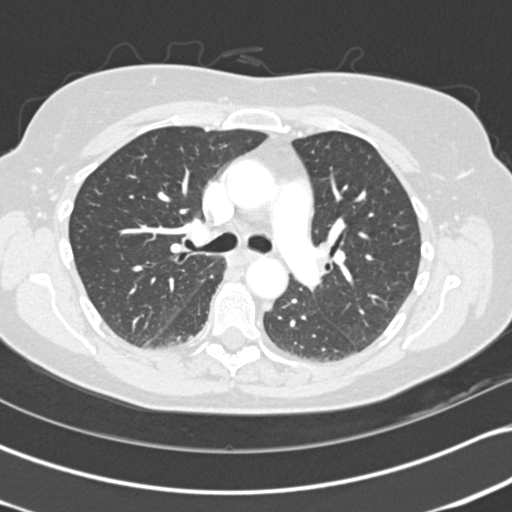
[im 143/220  mediastinal]
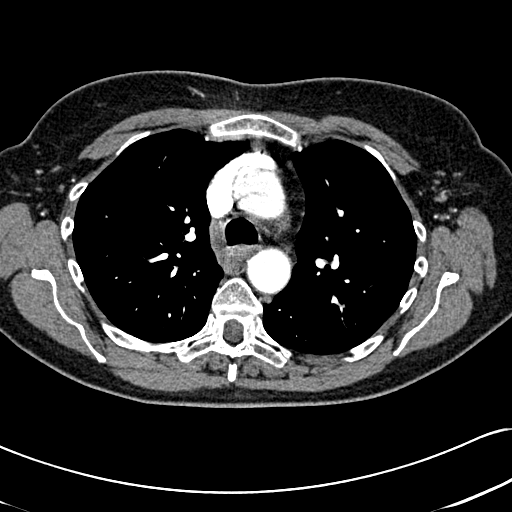
[im 154/220  lung]
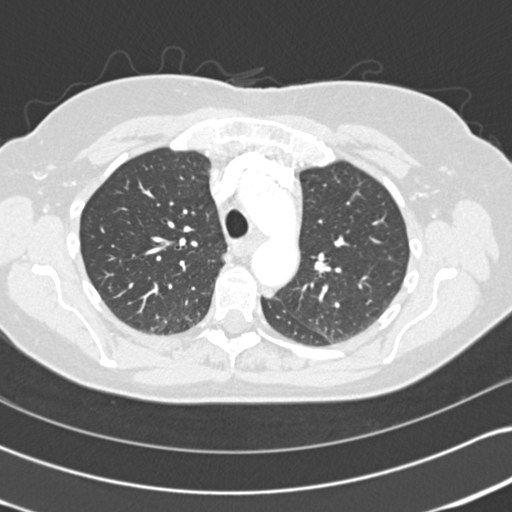
[im 165/220  mediastinal]
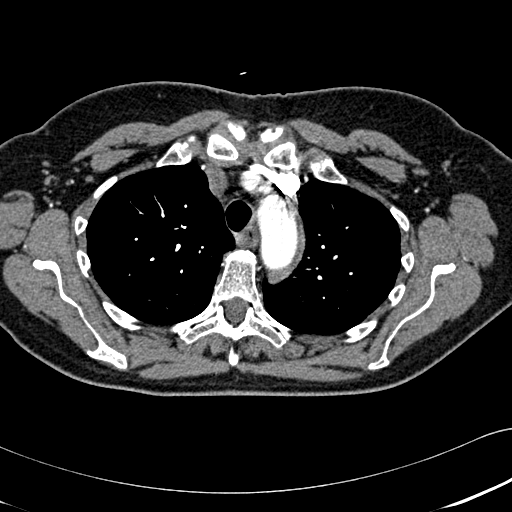
[im 176/220  lung]
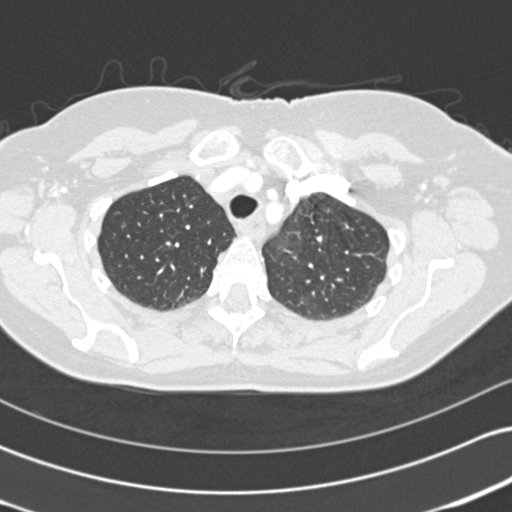
[im 187/220  mediastinal]
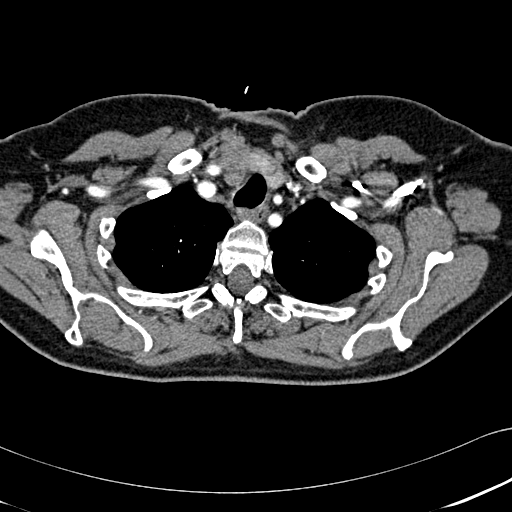
[im 198/220  lung]
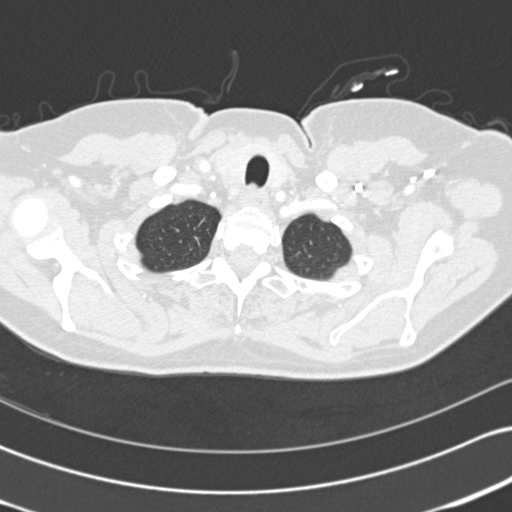
[im 209/220  mediastinal]
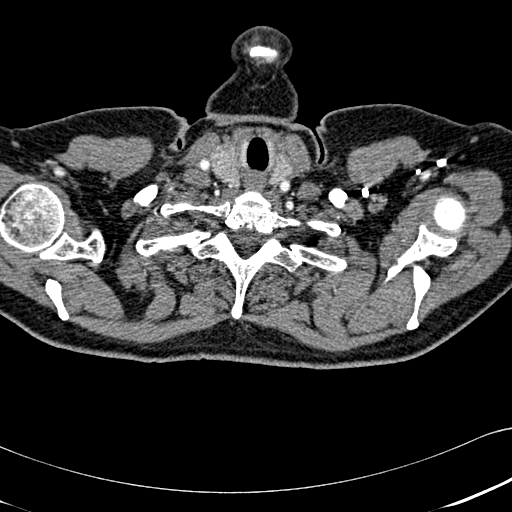

[Series 604: coronal mpr · coronal · 0.61mm/px · 1 of 110 slices shown]
[im 55/110  mediastinal]
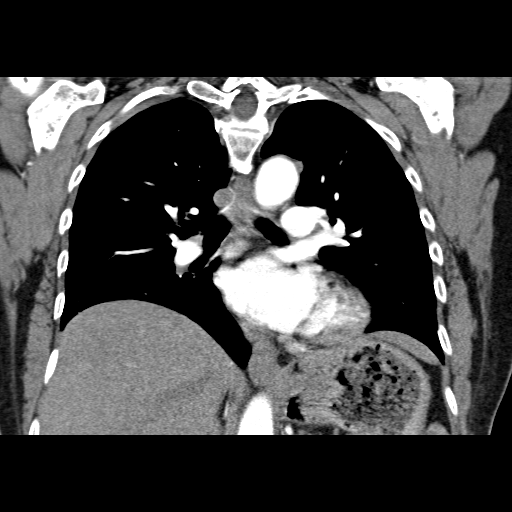

[19 of 36 positions shown; findings below may reference images not displayed]

FINDINGS: No evidence of pulmonary embolism.

Mild dependent atelectasis at the lung bases.  Lungs otherwise
clear.  No pleural effusion or pneumothorax.

Visualized thyroid is notable for right thyroid nodules.

The heart is normal in size.  No pericardial effusion.

No suspicious mediastinal, hilar, or axillary lymphadenopathy.

Visualized upper abdomen is unremarkable.

Mild degenerative changes of the thoracic spine with mild superior
endplate compression of a lower thoracic vertebral body, likely T9.
IMPRESSION: No evidence of pulmonary embolism.

No evidence of acute cardiopulmonary disease.

## 2013-01-10 ENCOUNTER — Ambulatory Visit (INDEPENDENT_AMBULATORY_CARE_PROVIDER_SITE_OTHER): Payer: BC Managed Care – PPO | Admitting: Internal Medicine

## 2013-01-10 ENCOUNTER — Encounter: Payer: Self-pay | Admitting: Internal Medicine

## 2013-01-10 VITALS — BP 160/102 | HR 100 | Temp 98.8°F | Wt 153.0 lb

## 2013-01-10 DIAGNOSIS — I10 Essential (primary) hypertension: Secondary | ICD-10-CM

## 2013-01-10 MED ORDER — HYDROCHLOROTHIAZIDE 12.5 MG PO CAPS: 12.5000 mg | ORAL_CAPSULE | Freq: Every day | ORAL | Status: DC

## 2013-01-10 NOTE — Patient Instructions (Addendum)

## 2013-01-10 NOTE — Assessment & Plan Note (Signed)
Blood pressure goals discussed. Low-dose diuretic will be added

## 2013-01-10 NOTE — Progress Notes (Signed)
  Subjective:    Patient ID: Chloe Young, female    DOB: [birth year], 62 y.o.   MRN: [id number]  HPI Her blood pressure was noted to be significantly elevated at the pain clinic in December and again recently. The most recent monitor revealed 167/109. She is on metoprolol 50 mg one half twice a day  Home BP ranges: 116/75-164/100  Family history is positive for heart disease as well as stroke  She is on Weight Watchers w/o added salt; she walks 15-20 min up to twice a daily without symptoms.    Review of Systems  She denies epistaxis, significant headache, chest pain, palpitations, dyspnea, edema, or claudication.     Objective:   Physical Exam   She appears healthy and well-nourished; she is in no acute distress  No carotid bruits are present.  Heart rhythm and rate ( 90) are normal with no significant murmurs or gallops.  Chest is clear with no increased work of breathing  There is no evidence of aortic aneurysm or renal artery bruits  She has no clubbing or edema.   Pedal pulses are intact   No ischemic skin changes are present      Assessment & Plan:

## 2013-01-29 ENCOUNTER — Telehealth: Payer: Self-pay | Admitting: Internal Medicine

## 2013-01-29 MED ORDER — FLUTICASONE PROPIONATE 50 MCG/ACT NA SUSP: 1.0000 | Freq: Two times a day (BID) | NASAL | Status: DC

## 2013-01-29 NOTE — Telephone Encounter (Signed)
Plain Mucinex (NOT D) for thick secretions ;force NON dairy fluids .   If desired Fluticasone can be Rxed as  1 spray in each nostril twice a day as needed. Use the "crossover" technique into opposite nostril spraying toward opposite ear @ 45 degree angle, not straight up into nostril.  Use a Neti pot daily only  as needed for significant sinus congestion; going from open side to congested side . Plain Allegra (NOT D )  160 daily , Loratidine 10 mg , OR Zyrtec 10 mg @ bedtime  as needed for itchy eyes & sneezing.

## 2013-01-29 NOTE — Telephone Encounter (Signed)
Hopp please advise  

## 2013-01-29 NOTE — Telephone Encounter (Signed)
Discuss with patient, Rx sent. 

## 2013-01-29 NOTE — Telephone Encounter (Signed)
Patient Information:  Caller Name: Saidah  Phone: [phone]  Patient: Chloe Young, Chloe Young  Gender: Female  DOB: [birth year]  Age: 62 Years  PCP: Marga Melnick  Office Follow Up:  Does the office need to follow up with this patient?: Yes  Instructions For The Office: OFFICE PLEASE FOLLOW UP WITH PATIENT.  PT WANTS TO KNOW WHAT OTC MEDICATION DR HOPPER WOULD ADVISE FOR PATIENT TO TAKE FOR HER SNEEZING AND RUNNY NOSE.   Symptoms  Reason For Call & Symptoms: pt reports she has been taking an OTC decongestant for cold symptoms.  Pt reports she has taken 2 -24 hour Chlorocidin .  Pt reports she is sneezing and her nose is running.  Pt wants to know if this medication is ok to take with her BP.  RN advised for pt to not take decongestants with history of high blood pressure.  An antihistamine would be a better choice to take for symptoms and high blood pressure.  Reviewed Health History In EMR: Yes  Reviewed Medications In EMR: Yes  Reviewed Allergies In EMR: Yes  Reviewed Surgeries / Procedures: Yes  Date of Onset of Symptoms: Unknown  Guideline(s) Used:  No Protocol Available - Information Only  Disposition Per Guideline:   Discuss with PCP and Callback by Nurse Today  Reason For Disposition Reached:   Nursing judgment  Advice Given:  Call Back If:  New symptoms develop  You become worse.

## 2013-02-05 ENCOUNTER — Other Ambulatory Visit (HOSPITAL_COMMUNITY): Payer: Self-pay | Admitting: Specialist

## 2013-02-05 DIAGNOSIS — M25562 Pain in left knee: Secondary | ICD-10-CM

## 2013-02-08 ENCOUNTER — Other Ambulatory Visit: Payer: Self-pay

## 2013-02-17 ENCOUNTER — Encounter (HOSPITAL_COMMUNITY)
Admission: RE | Admit: 2013-02-17 | Discharge: 2013-02-17 | Disposition: A | Discharge status: Home / Self Care | Payer: BC Managed Care – PPO | Source: Ambulatory Visit | Attending: Specialist | Admitting: Specialist

## 2013-02-17 DIAGNOSIS — T8489XA Other specified complication of internal orthopedic prosthetic devices, implants and grafts, initial encounter: Secondary | ICD-10-CM | POA: Insufficient documentation

## 2013-02-17 DIAGNOSIS — M25562 Pain in left knee: Secondary | ICD-10-CM

## 2013-02-17 DIAGNOSIS — M25569 Pain in unspecified knee: Secondary | ICD-10-CM | POA: Insufficient documentation

## 2013-02-17 DIAGNOSIS — Z96659 Presence of unspecified artificial knee joint: Secondary | ICD-10-CM | POA: Insufficient documentation

## 2013-02-17 DIAGNOSIS — Y831 Surgical operation with implant of artificial internal device as the cause of abnormal reaction of the patient, or of later complication, without mention of misadventure at the time of the procedure: Secondary | ICD-10-CM | POA: Insufficient documentation

## 2013-02-17 MED ORDER — TECHNETIUM TC 99M MEDRONATE IV KIT
25.0000 | PACK | Freq: Once | INTRAVENOUS | Status: AC | PRN
  Administered 2013-02-17: 25 via INTRAVENOUS

## 2013-03-26 ENCOUNTER — Encounter: Payer: Self-pay | Admitting: Gynecology

## 2013-05-07 ENCOUNTER — Other Ambulatory Visit: Payer: Self-pay | Admitting: Internal Medicine

## 2013-05-21 ENCOUNTER — Other Ambulatory Visit: Payer: Self-pay | Admitting: Internal Medicine

## 2013-05-29 ENCOUNTER — Other Ambulatory Visit: Payer: Self-pay

## 2013-05-29 MED ORDER — ESTRADIOL-NORETHINDRONE ACET 0.05-0.25 MG/DAY TD PTTW: 1.0000 | MEDICATED_PATCH | TRANSDERMAL | Status: DC

## 2013-06-30 ENCOUNTER — Other Ambulatory Visit: Payer: Self-pay | Admitting: Internal Medicine

## 2013-06-30 ENCOUNTER — Ambulatory Visit (INDEPENDENT_AMBULATORY_CARE_PROVIDER_SITE_OTHER): Payer: No Typology Code available for payment source | Admitting: Gynecology

## 2013-06-30 ENCOUNTER — Encounter: Payer: Self-pay | Admitting: Gynecology

## 2013-06-30 VITALS — BP 140/88 | Ht 62.0 in | Wt 146.0 lb

## 2013-06-30 DIAGNOSIS — M899 Disorder of bone, unspecified: Secondary | ICD-10-CM

## 2013-06-30 DIAGNOSIS — Z01419 Encounter for gynecological examination (general) (routine) without abnormal findings: Secondary | ICD-10-CM

## 2013-06-30 DIAGNOSIS — Z79890 Hormone replacement therapy (postmenopausal): Secondary | ICD-10-CM

## 2013-06-30 DIAGNOSIS — M8580 Other specified disorders of bone density and structure, unspecified site: Secondary | ICD-10-CM

## 2013-06-30 DIAGNOSIS — N952 Postmenopausal atrophic vaginitis: Secondary | ICD-10-CM

## 2013-06-30 MED ORDER — ESTRADIOL-NORETHINDRONE ACET 0.05-0.25 MG/DAY TD PTTW: 1.0000 | MEDICATED_PATCH | TRANSDERMAL | Status: DC

## 2013-06-30 NOTE — Patient Instructions (Signed)
Follow up in one year for annual exam 

## 2013-06-30 NOTE — Progress Notes (Signed)
Chloe Young 1951-07-02 [id number]        62 y.o.  G1P1 for annual exam.  Several issues noted below.  Past medical history,surgical history, medications, allergies, family history and social history were all reviewed and documented in the EPIC chart.  ROS:  Performed and pertinent positives and negatives are included in the history, assessment and plan .  Exam: Sherrilyn Rist assistant Filed Vitals:   06/30/13 1047  BP: 140/88  Height: 5\' 2"  (1.575 m)  Weight: 146 lb (66.225 kg)   General appearance  Normal Skin grossly normal Head/Neck normal with no cervical or supraclavicular adenopathy thyroid normal Lungs  clear Cardiac RR, without RMG Abdominal  soft, nontender, without masses, organomegaly or hernia Breasts  examined lying and sitting without masses, retractions, discharge or axillary adenopathy. Pelvic  Ext/BUS/vagina  normal with atrophic changes  Cervix  normal with atrophic changes  Uterus  anteverted, normal size, shape and contour, midline and mobile nontender   Adnexa  Without masses or tenderness    Anus and perineum  normal   Rectovaginal  normal sphincter tone without palpated masses or tenderness.    Assessment/Plan:  62 y.o. G1P1 female for annual exam.   1. HRT. Patient continues on CombiPatch. She has tried stopping but had unacceptable side effects to include hot flashes sweats vaginal dryness.  I again reviewed the whole issue of HRT with her to include the WHI study with increased risk of stroke, heart attack, DVT and breast cancer. The ACOG and NAMS statements for lowest dose for the shortest period of time reviewed. Transdermal versus oral first-pass effect benefit discussed. Patient understands accepts and wants to continue it I refilled her time to year. 2. Atrophic genital changes. Patient doing well without vaginal dryness or dyspareunia. We'll continue to monitor. 3. Osteopenia. DEXA 04/2012 with T score -1.2 FRAX 8.9%/0.7%. Recommend repeat next year at  two-year interval. Increase calcium vitamin D reviewed. Then the double 40 last year. Asked her repeatedly she has her routine blood work done through her primary physician's office. 4. Pap smear 2012. No Pap smear done today. No history of significant abnormal Pap smears. Plan repeat Pap smear next year at 62-year-old interval. 5. Mammography 03/2013. Continue with annual mammography. SBE monthly reviewed. 6. Colonoscopy 05/2010. Repeat at their recommended interval. 7. Health maintenance. No blood work done as it is all done through her primary physician's office. Blood pressure 140/88 reviewed with her. Followup one year, sooner as needed.  Note: This document was prepared with digital dictation and possible smart phrase technology. Any transcriptional errors that result from this process are unintentional.   Dara Lords MD, 11:20 AM 06/30/2013

## 2013-07-01 LAB — URINALYSIS W MICROSCOPIC + REFLEX CULTURE
Bacteria, UA: NONE SEEN
Bilirubin Urine: NEGATIVE
Crystals: NONE SEEN
Glucose, UA: NEGATIVE mg/dL
Ketones, ur: NEGATIVE mg/dL
Protein, ur: NEGATIVE mg/dL
Squamous Epithelial / LPF: NONE SEEN
Urobilinogen, UA: 0.2 mg/dL (ref 0.0–1.0)

## 2013-09-03 HISTORY — PX: OTHER SURGICAL HISTORY: SHX169

## 2013-09-12 ENCOUNTER — Encounter: Payer: Self-pay | Admitting: Internal Medicine

## 2013-10-30 ENCOUNTER — Other Ambulatory Visit: Payer: Self-pay

## 2013-11-25 ENCOUNTER — Encounter: Payer: Self-pay | Admitting: Internal Medicine

## 2013-11-25 ENCOUNTER — Other Ambulatory Visit: Payer: Self-pay | Admitting: Internal Medicine

## 2013-11-25 ENCOUNTER — Ambulatory Visit (INDEPENDENT_AMBULATORY_CARE_PROVIDER_SITE_OTHER): Payer: No Typology Code available for payment source | Admitting: Internal Medicine

## 2013-11-25 VITALS — BP 130/73 | HR 52 | Temp 98.2°F | Resp 12 | Ht 62.75 in | Wt 143.6 lb

## 2013-11-25 DIAGNOSIS — R778 Other specified abnormalities of plasma proteins: Secondary | ICD-10-CM | POA: Insufficient documentation

## 2013-11-25 DIAGNOSIS — R7301 Impaired fasting glucose: Secondary | ICD-10-CM | POA: Insufficient documentation

## 2013-11-25 DIAGNOSIS — I10 Essential (primary) hypertension: Secondary | ICD-10-CM

## 2013-11-25 DIAGNOSIS — R7303 Prediabetes: Secondary | ICD-10-CM | POA: Insufficient documentation

## 2013-11-25 DIAGNOSIS — E785 Hyperlipidemia, unspecified: Secondary | ICD-10-CM

## 2013-11-25 DIAGNOSIS — E876 Hypokalemia: Secondary | ICD-10-CM | POA: Insufficient documentation

## 2013-11-25 DIAGNOSIS — R799 Abnormal finding of blood chemistry, unspecified: Secondary | ICD-10-CM

## 2013-11-25 LAB — BASIC METABOLIC PANEL
BUN: 10 mg/dL (ref 6–23)
Calcium: 9.5 mg/dL (ref 8.4–10.5)
GFR: 85.43 mL/min (ref >60.00)
Glucose, Bld: 82 mg/dL (ref 70–99)
Potassium: 3.2 mEq/L — ABNORMAL LOW (ref 3.5–5.1)
Sodium: 136 mEq/L (ref 135–145)

## 2013-11-25 LAB — HEPATIC FUNCTION PANEL
ALT: 20 U/L (ref 0–35)
AST: 23 U/L (ref 0–37)
Bilirubin, Direct: 0 mg/dL (ref 0.0–0.3)
Total Bilirubin: 0.6 mg/dL (ref 0.3–1.2)
Total Protein: 7.4 g/dL (ref 6.0–8.3)

## 2013-11-25 LAB — HEMOGLOBIN A1C: Hgb A1c MFr Bld: 5.6 % (ref 4.6–6.5)

## 2013-11-25 MED ORDER — METOPROLOL TARTRATE 50 MG PO TABS: ORAL_TABLET | ORAL | Status: DC

## 2013-11-25 MED ORDER — SPIRONOLACTONE 25 MG PO TABS: 25.0000 mg | ORAL_TABLET | Freq: Every day | ORAL | Status: DC

## 2013-11-25 MED ORDER — HYDROCHLOROTHIAZIDE 12.5 MG PO CAPS: 12.5000 mg | ORAL_CAPSULE | Freq: Every day | ORAL | Status: DC

## 2013-11-25 NOTE — Assessment & Plan Note (Signed)
Lipids 

## 2013-11-25 NOTE — Assessment & Plan Note (Signed)
Reassess TP; may need SPE if progressive

## 2013-11-25 NOTE — Patient Instructions (Signed)

## 2013-11-25 NOTE — Assessment & Plan Note (Signed)
BP controlled BMET 

## 2013-11-25 NOTE — Assessment & Plan Note (Signed)
A1c

## 2013-11-25 NOTE — Progress Notes (Signed)
   Subjective:    Patient ID: Chloe Young, female    DOB: 1951/03/01, 62 y.o.   MRN: [id number]  HPI HYPERTENSION: Disease Monitoring: Blood pressure range-130/< 70 Chest pain, palpitations- no       Dyspnea- no Medications: Compliance- yes  Lightheadedness,Syncope- no   Edema- occasionally  FASTING HYPERGLYCEMIA, PMH of:  Polyuria/phagia/dipsia- no       Visual problems- no  HYPERLIPIDEMIA: Disease Monitoring: See symptoms for Hypertension Medications: Compliance- statin D/Ced due to joint pain Abd pain, bowel changes- no   Muscle aches- no    Review of Systems    She continues to see Dr. Ollen Bowl at the chronic pain clinic; medication list was updated.  The review of systems as noted above; she is asymptomatic at this time.     Objective:   Physical Exam  Gen.:  well-nourished in appearance. Alert, appropriate and cooperative throughout exam.Appears younger than stated age  Head: Normocephalic without obvious abnormalities  Eyes: No corneal or conjunctival inflammation noted. Pupils equal round reactive to light and accommodation. Extraocular motion intact.Slight vertical nystagmus Ears: External  ear exam reveals no significant lesions or deformities. Canals clear .TMs normal.  Nose: External nasal exam reveals no deformity or inflammation. Nasal mucosa are pink and moist. No lesions or exudates noted.   Mouth: Oral mucosa and oropharynx reveal no lesions or exudates. Teeth in good repair. Neck: No deformities, masses, or tenderness noted. Range of motion decreased. Thyroid normal. Lungs: Normal respiratory effort; chest expands symmetrically. Lungs are clear to auscultation without rales, wheezes, or increased work of breathing. Heart: Normal rate and rhythm. Normal S1 and S2. No gallop, click, or rub. S4 w/o murmur. Abdomen: Bowel sounds normal; abdomen soft and nontender. No masses, organomegaly or hernias noted. Genitalia: as per Gyn                                   Musculoskeletal/extremities: No deformity or scoliosis noted of  the thoracic or lumbar spine.   No clubbing, cyanosis, edema, or significant extremity  deformity noted. Range of motion decreased @ knees .Tone & strength normal. Hand joints normal . Fingernail  health good. Able to lie down & sit up w/o help. Negative SLR bilaterally Vascular: Carotid, radial artery, dorsalis pedis and  posterior tibial pulses are equal. Pedal pulses decreased.No bruits present. Neurologic: Alert and oriented x3. Deep tendon reflexes symmetrical but 0 + @ knees.       Skin: Intact without suspicious lesions or rashes. Lymph: No cervical, axillary lymphadenopathy present. Psych: Mood and affect are normal. Normally interactive                                                                                         Assessment & Plan:  See Current Assessment & Plan in Problem List under specific Diagnosis

## 2013-11-26 ENCOUNTER — Other Ambulatory Visit: Payer: Self-pay | Admitting: *Deleted

## 2014-02-02 ENCOUNTER — Encounter: Payer: Self-pay | Admitting: Internal Medicine

## 2014-02-02 ENCOUNTER — Ambulatory Visit (INDEPENDENT_AMBULATORY_CARE_PROVIDER_SITE_OTHER): Payer: No Typology Code available for payment source | Admitting: Internal Medicine

## 2014-02-02 VITALS — BP 130/80 | HR 95 | Temp 98.1°F | Resp 14 | Wt 144.8 lb

## 2014-02-02 DIAGNOSIS — J310 Chronic rhinitis: Secondary | ICD-10-CM

## 2014-02-02 DIAGNOSIS — R059 Cough, unspecified: Secondary | ICD-10-CM

## 2014-02-02 DIAGNOSIS — R05 Cough: Secondary | ICD-10-CM

## 2014-02-02 MED ORDER — AMOXICILLIN 500 MG PO CAPS: 500.0000 mg | ORAL_CAPSULE | Freq: Three times a day (TID) | ORAL | Status: DC

## 2014-02-02 NOTE — Progress Notes (Signed)
Pre visit review using our clinic review tool, if applicable. No additional management support is needed unless otherwise documented below in the visit note. 

## 2014-02-02 NOTE — Progress Notes (Signed)
HPI- nasal congestion alternating with runny nose (clear secretions), sneezing, and itchy watery eyes started 4-5 days ago along with non-productive cough. Began having laryngitis yesterday. Throat feels raw but not sore. Does have some pain under her eyes. Has tried mucinex which did not help and neti pot which has helped. States chest feels "heavy" and she is concerned about illness going to her chest. Denies fever, chills, sweats.

## 2014-02-02 NOTE — Progress Notes (Signed)
   Subjective:    Patient ID: Chloe Young, female    DOB: 05/01/1951, 63 y.o.   MRN: [id number]  HPI  nasal congestion alternating with runny nose (clear secretions), sneezing, and itchy watery eyes started 4-5 days ago along with non-productive cough. Began having laryngitis yesterday. Throat feels raw but not sore. Does have some pain under her eyes. Has tried mucinex which did not help and neti pot which has helped. States chest feels "heavy" and she is concerned about illness going to her chest. Denies fever, chills, sweats.          Review of Systems .  She denies otic pain or otic discharge  She feels like she's having trouble getting enough air although she's not having specific dyspnea or wheezing     Objective:   Physical Exam Gen.: Healthy and well-nourished in appearance. Alert, appropriate and cooperative throughout exam.Head: Normocephalic without obvious abnormalities;   Eyes: No corneal or conjunctival inflammation noted. Pupils equal round reactive to light and accommodation. Extraocular motion intact. Ears: External  ear exam reveals no significant lesions or deformities. Canals clear .TMs normal. Hearing is grossly n ormal bilaterally. Nose: External nasal exam reveals no deformity or inflammation. Nasal mucosa are pink and moist. No lesions or exudates noted.  Mouth: Oral mucosa and oropharynx reveal no lesions or exudates. Teeth in good repair.Hoarse Neck: No deformities, masses, or tenderness noted.  Lungs: Normal respiratory effort; chest expands symmetrically. Lungs are clear to auscultation without rales, wheezes, or increased work of breathing. Heart: Normal rate and rhythm. Normal S1 and S2. No gallop, click, or rub.                                 Musculoskeletal/extremities:  No clubbing, cyanosis, edema, or significant extremity  deformity noted. Skin: Intact without suspicious lesions or rashes. Lymph: No cervical, axillary lymphadenopathy  present. Psych: Mood and affect are normal. Normally interactive                                                                                        Assessment & Plan:  #1 nonallergic rhinitis with associated laryngitis and cough. Clinical criteria for rhinosinusitis  not present  Plan: Aggressive nasal hygiene. If signs of rhinosinusitis appear despite this program; amoxicillin will be initiated. Prescription provided

## 2014-02-02 NOTE — Patient Instructions (Addendum)
Plain Mucinex (NOT D) for thick secretions ;force NON dairy fluids .   Nasal cleansing in the shower as discussed with lather of mild shampoo.After 10 seconds wash off lather while  exhaling through nostrils. Make sure that all residual soap is removed to prevent irritation.  Flonase OR Nasacort AQ 1 spray in each nostril twice a day as needed. Use the "crossover" technique into opposite nostril spraying toward opposite ear @ 45 degree angle, not straight up into nostril.  Use a Neti pot daily only  as needed for significant sinus congestion; going from open side to congested side . Plain Allegra (NOT D )  160 daily , Loratidine 10 mg , OR Zyrtec 10 mg @ bedtime  as needed for itchy eyes & sneezing. Zicam Melts or Zinc lozenges as per package label for scratchy throat . Complementary options include  vitamin C 2000 mg daily; & Echinacea for 4-7 days. Report persistent or progressive fever; discolored nasal or chest secretions; or frontal headache or facial  pain.  Fill the  prescription for the antibiotic it there is not dramatic improvement in the next 48 hours.

## 2014-02-04 ENCOUNTER — Other Ambulatory Visit: Payer: Self-pay | Admitting: Internal Medicine

## 2014-02-04 NOTE — Telephone Encounter (Signed)
Rx sent to the pharmacy by e-script.//AB/CMA 

## 2014-08-18 ENCOUNTER — Other Ambulatory Visit: Payer: Self-pay | Admitting: Gynecology

## 2014-09-28 ENCOUNTER — Encounter: Payer: No Typology Code available for payment source | Admitting: Gynecology

## 2014-10-19 ENCOUNTER — Other Ambulatory Visit (HOSPITAL_COMMUNITY)
Admission: RE | Admit: 2014-10-19 | Discharge: 2014-10-19 | Disposition: A | Discharge status: Home / Self Care | Payer: BC Managed Care – PPO | Source: Ambulatory Visit | Attending: Gynecology | Admitting: Gynecology

## 2014-10-19 ENCOUNTER — Encounter: Payer: Self-pay | Admitting: Gynecology

## 2014-10-19 ENCOUNTER — Ambulatory Visit (INDEPENDENT_AMBULATORY_CARE_PROVIDER_SITE_OTHER): Payer: BC Managed Care – PPO | Admitting: Gynecology

## 2014-10-19 VITALS — BP 120/76 | Ht 63.0 in | Wt 142.0 lb

## 2014-10-19 DIAGNOSIS — M8580 Other specified disorders of bone density and structure, unspecified site: Secondary | ICD-10-CM

## 2014-10-19 DIAGNOSIS — Z01419 Encounter for gynecological examination (general) (routine) without abnormal findings: Secondary | ICD-10-CM | POA: Diagnosis not present

## 2014-10-19 DIAGNOSIS — Z1151 Encounter for screening for human papillomavirus (HPV): Secondary | ICD-10-CM | POA: Insufficient documentation

## 2014-10-19 DIAGNOSIS — Z79890 Hormone replacement therapy (postmenopausal): Secondary | ICD-10-CM

## 2014-10-19 MED ORDER — ESTRADIOL-NORETHINDRONE ACET 0.05-0.25 MG/DAY TD PTTW: MEDICATED_PATCH | TRANSDERMAL | Status: DC

## 2014-10-19 NOTE — Patient Instructions (Signed)
Call to Schedule your mammogram  Facilities in [place]: 1)  [facility], [address] Phone: [phone] 2)  [facility]. [address] [place] Phone: [phone] 3)  Dr. Isaiah Blakes at [facility] [address] Phone: [phone]     Mammogram A mammogram is an X-ray test to find changes in a woman's breast. You should get a mammogram if:  You are 64 years of age or older  You have risk factors.   Your doctor recommends that you have one.  BEFORE THE TEST  Do not schedule the test the week before your period, especially if your breasts are sore during this time.  On the day of your mammogram:  Wash your breasts and armpits well. After washing, do not put on any deodorant or talcum powder on until after your test.   Eat and drink as you usually do.   Take your medicines as usual.   If you are diabetic and take insulin, make sure you:   Eat before coming for your test.   Take your insulin as usual.   If you cannot keep your appointment, call before the appointment to cancel. Schedule another appointment.  TEST  You will need to undress from the waist up. You will put on a hospital gown.   Your breast will be put on the mammogram machine, and it will press firmly on your breast with a piece of plastic called a compression paddle. This will make your breast flatter so that the machine can X-ray all parts of your breast.   Both breasts will be X-rayed. Each breast will be X-rayed from above and from the side. An X-ray might need to be taken again if the picture is not good enough.   The mammogram will last about 15 to 30 minutes.  AFTER THE TEST Finding out the results of your test Ask when your test results will be ready. Make sure you get your test results.  Document Released: 03/09/2009 Document Revised: 11/30/2011 Document Reviewed: 03/09/2009 [facility] Patient  Information 2012 [place].  You may obtain a copy of any labs that were done today by logging onto MyChart as outlined in the instructions provided with your AVS (after visit summary). The office will not call with normal lab results but certainly if there are any significant abnormalities then we will contact you.   Health Maintenance, Female A healthy lifestyle and preventative care can promote health and wellness.  Maintain regular health, dental, and eye exams.  Eat a healthy diet. Foods like vegetables, fruits, whole grains, low-fat dairy products, and lean protein foods contain the nutrients you need without too many calories. Decrease your intake of foods high in solid fats, added sugars, and salt. Get information about a proper diet from your caregiver, if necessary.  Regular physical exercise is one of the most important things you can do for your health. Most adults should get at least 150 minutes of moderate-intensity exercise (any activity that increases your heart rate and causes you to sweat) each week. In addition, most adults need muscle-strengthening exercises on 2 or more days a week.   Maintain a healthy weight. The body mass index (BMI) is a screening tool to identify possible weight problems. It provides an estimate of body fat based on height and weight. Your caregiver can help determine your BMI, and can help you achieve or maintain a healthy weight. For adults  20 years and older:  A BMI below 18.5 is considered underweight.  A BMI of 18.5 to 24.9 is normal.  A BMI of 25 to 29.9 is considered overweight.  A BMI of 30 and above is considered obese.  Maintain normal blood lipids and cholesterol by exercising and minimizing your intake of saturated fat. Eat a balanced diet with plenty of fruits and vegetables. Blood tests for lipids and cholesterol should begin at age 20 and be repeated every 5 years. If your lipid or cholesterol levels are high, you are over 50, or  you are a high risk for heart disease, you may need your cholesterol levels checked more frequently.Ongoing high lipid and cholesterol levels should be treated with medicines if diet and exercise are not effective.  If you smoke, find out from your caregiver how to quit. If you do not use tobacco, do not start.  Lung cancer screening is recommended for adults aged 55 80 years who are at high risk for developing lung cancer because of a history of smoking. Yearly low-dose computed tomography (CT) is recommended for people who have at least a 30-pack-year history of smoking and are a current smoker or have quit within the past 15 years. A pack year of smoking is smoking an average of 1 pack of cigarettes a day for 1 year (for example: 1 pack a day for 30 years or 2 packs a day for 15 years). Yearly screening should continue until the smoker has stopped smoking for at least 15 years. Yearly screening should also be stopped for people who develop a health problem that would prevent them from having lung cancer treatment.  If you are pregnant, do not drink alcohol. If you are breastfeeding, be very cautious about drinking alcohol. If you are not pregnant and choose to drink alcohol, do not exceed 1 drink per day. One drink is considered to be 12 ounces (355 mL) of beer, 5 ounces (148 mL) of wine, or 1.5 ounces (44 mL) of liquor.  Avoid use of street drugs. Do not share needles with anyone. Ask for help if you need support or instructions about stopping the use of drugs.  High blood pressure causes heart disease and increases the risk of stroke. Blood pressure should be checked at least every 1 to 2 years. Ongoing high blood pressure should be treated with medicines, if weight loss and exercise are not effective.  If you are 55 to 64 years old, ask your caregiver if you should take aspirin to prevent strokes.  Diabetes screening involves taking a blood sample to check your fasting blood sugar level. This  should be done once every 3 years, after age 45, if you are within normal weight and without risk factors for diabetes. Testing should be considered at a younger age or be carried out more frequently if you are overweight and have at least 1 risk factor for diabetes.  Breast cancer screening is essential preventative care for women. You should practice "breast self-awareness." This means understanding the normal appearance and feel of your breasts and may include breast self-examination. Any changes detected, no matter how small, should be reported to a caregiver. Women in their 20s and 30s should have a clinical breast exam (CBE) by a caregiver as part of a regular health exam every 1 to 3 years. After age 40, women should have a CBE every year. Starting at age 40, women should consider having a mammogram (breast X-ray) every year. Women who have a   family history of breast cancer should talk to their caregiver about genetic screening. Women at a high risk of breast cancer should talk to their caregiver about having an MRI and a mammogram every year.  Breast cancer gene (BRCA)-related cancer risk assessment is recommended for women who have family members with BRCA-related cancers. BRCA-related cancers include breast, ovarian, tubal, and peritoneal cancers. Having family members with these cancers may be associated with an increased risk for harmful changes (mutations) in the breast cancer genes BRCA1 and BRCA2. Results of the assessment will determine the need for genetic counseling and BRCA1 and BRCA2 testing.  The Pap test is a screening test for cervical cancer. Women should have a Pap test starting at age 33. Between ages 75 and 14, Pap tests should be repeated every 2 years. Beginning at age 4, you should have a Pap test every 3 years as long as the past 3 Pap tests have been normal. If you had a hysterectomy for a problem that was not cancer or a condition that could lead to cancer, then you no longer  need Pap tests. If you are between ages 72 and 12, and you have had normal Pap tests going back 10 years, you no longer need Pap tests. If you have had past treatment for cervical cancer or a condition that could lead to cancer, you need Pap tests and screening for cancer for at least 20 years after your treatment. If Pap tests have been discontinued, risk factors (such as a new sexual partner) need to be reassessed to determine if screening should be resumed. Some women have medical problems that increase the chance of getting cervical cancer. In these cases, your caregiver may recommend more frequent screening and Pap tests.  The human papillomavirus (HPV) test is an additional test that may be used for cervical cancer screening. The HPV test looks for the virus that can cause the cell changes on the cervix. The cells collected during the Pap test can be tested for HPV. The HPV test could be used to screen women aged 84 years and older, and should be used in women of any age who have unclear Pap test results. After the age of 73, women should have HPV testing at the same frequency as a Pap test.  Colorectal cancer can be detected and often prevented. Most routine colorectal cancer screening begins at the age of 56 and continues through age 76. However, your caregiver may recommend screening at an earlier age if you have risk factors for colon cancer. On a yearly basis, your caregiver may provide home test kits to check for hidden blood in the stool. Use of a small camera at the end of a tube, to directly examine the colon (sigmoidoscopy or colonoscopy), can detect the earliest forms of colorectal cancer. Talk to your caregiver about this at age 20, when routine screening begins. Direct examination of the colon should be repeated every 5 to 10 years through age 83, unless early forms of pre-cancerous polyps or small growths are found.  Hepatitis C blood testing is recommended for all people born from [birth year]  through 1965 and any individual with known risks for hepatitis C.  Practice safe sex. Use condoms and avoid high-risk sexual practices to reduce the spread of sexually transmitted infections (STIs). Sexually active women aged 28 and younger should be checked for Chlamydia, which is a common sexually transmitted infection. Older women with new or multiple partners should also be tested for Chlamydia. Testing for  other STIs is recommended if you are sexually active and at increased risk.  Osteoporosis is a disease in which the bones lose minerals and strength with aging. This can result in serious bone fractures. The risk of osteoporosis can be identified using a bone density scan. Women ages 35 and over and women at risk for fractures or osteoporosis should discuss screening with their caregivers. Ask your caregiver whether you should be taking a calcium supplement or vitamin D to reduce the rate of osteoporosis.  Menopause can be associated with physical symptoms and risks. Hormone replacement therapy is available to decrease symptoms and risks. You should talk to your caregiver about whether hormone replacement therapy is right for you.  Use sunscreen. Apply sunscreen liberally and repeatedly throughout the day. You should seek shade when your shadow is shorter than you. Protect yourself by wearing long sleeves, pants, a wide-brimmed hat, and sunglasses year round, whenever you are outdoors.  Notify your caregiver of new moles or changes in moles, especially if there is a change in shape or color. Also notify your caregiver if a mole is larger than the size of a pencil eraser.  Stay current with your immunizations. Document Released: 06/26/2011 Document Revised: 04/07/2013 Document Reviewed: 06/26/2011 [facility] Patient Information 2014 [place].

## 2014-10-19 NOTE — Progress Notes (Signed)
Chloe Young 11/29/1951 [id number]        63 y.o.  G1P1 for annual exam.  Several issues noted below.  Past medical history,surgical history, problem list, medications, allergies, family history and social history were all reviewed and documented as reviewed in the EPIC chart.  ROS:  12 system ROS performed with pertinent positives and negatives included in the history, assessment and plan.   Additional significant findings :  none   Exam: KimAlexis assistant Filed Vitals:   10/19/14 1420  BP: 120/76  Height: 5\' 3"  (1.6 m)  Weight: 142 lb (64.411 kg)   General appearance:  Normal affect, orientation and appearance. Skin: Grossly normal HEENT: Without gross lesions.  No cervical or supraclavicular adenopathy. Thyroid normal.  Lungs:  Clear without wheezing, rales or rhonchi Cardiac: RR, without RMG Abdominal:  Soft, nontender, without masses, guarding, rebound, organomegaly or hernia Breasts:  Examined lying and sitting without masses, retractions, discharge or axillary adenopathy. Pelvic:  Ext/BUS/vagina normal with atrophic changes  Cervix with atrophic changes. Pap done  Uterus anteverted, normal size, shape and contour, midline and mobile nontender   Adnexa  Without masses or tenderness    Anus and perineum  Normal   Rectovaginal  Normal sphincter tone without palpated masses or tenderness.    Assessment/Plan:  63 y.o. G1P1 female for annual exam.   1. Postmenopausal/HRT. Patient continues on CombiPatch 0.05/0.25 doing well. She tried stopping it but had unacceptable hot flushes and she wants to continue. I again reviewed the whole issue of HRT with her to include the WHI study with increased risk of stroke, heart attack, DVT and breast cancer. The ACOG and NAMS statements for lowest dose for the shortest period of time reviewed. Transdermal versus oral first-pass effect benefit discussed.  Patient understands and accepts the risks and I refilled her 1 year. No history of  vaginal bleeding. Patient knows to report any vaginal bleeding. 2. Mammography 02/2013. Patient knows she is overdue and agrees to schedule. SBE monthly reviewed. 3. Pap smear 2012. Pap smear done today. 4. Osteopenia. DEXA 04/2012 T score -1.2 FRAX 8.9%/0.7%. Increase calcium vitamin D reviewed. Plan repeat DEXA in another year or 2 as she is on HRT with minimal osteopenia. 5. Colonoscopy 2011. Repeat at their recommended interval. 6. Health maintenance. No routine blood work and that she reports is done at her primary physician's office. Follow up 1 year, sooner as needed.     Anastasio Auerbach MD, 2:40 PM 10/19/2014

## 2014-10-20 LAB — URINALYSIS W MICROSCOPIC + REFLEX CULTURE
Bacteria, UA: NONE SEEN
Bilirubin Urine: NEGATIVE
Casts: NONE SEEN
Crystals: NONE SEEN
Glucose, UA: NEGATIVE mg/dL
Hgb urine dipstick: NEGATIVE
Ketones, ur: NEGATIVE mg/dL
LEUKOCYTES UA: NEGATIVE
NITRITE: NEGATIVE
PROTEIN: NEGATIVE mg/dL
Specific Gravity, Urine: 1.007 (ref 1.005–1.030)
UROBILINOGEN UA: 0.2 mg/dL (ref 0.0–1.0)
pH: 7 (ref 5.0–8.0)

## 2014-10-21 LAB — CYTOLOGY - PAP

## 2014-10-26 ENCOUNTER — Encounter: Payer: Self-pay | Admitting: Gynecology

## 2014-11-13 ENCOUNTER — Other Ambulatory Visit: Payer: Self-pay | Admitting: Internal Medicine

## 2014-11-21 ENCOUNTER — Other Ambulatory Visit: Payer: Self-pay | Admitting: Internal Medicine

## 2014-11-25 ENCOUNTER — Other Ambulatory Visit: Payer: Self-pay | Admitting: Internal Medicine

## 2015-05-06 ENCOUNTER — Other Ambulatory Visit: Payer: Self-pay | Admitting: Internal Medicine

## 2015-05-17 ENCOUNTER — Other Ambulatory Visit: Payer: Self-pay | Admitting: Internal Medicine

## 2015-05-17 ENCOUNTER — Ambulatory Visit (INDEPENDENT_AMBULATORY_CARE_PROVIDER_SITE_OTHER): Payer: Medicare Other | Admitting: Internal Medicine

## 2015-05-17 ENCOUNTER — Encounter: Payer: Self-pay | Admitting: Internal Medicine

## 2015-05-17 ENCOUNTER — Other Ambulatory Visit (INDEPENDENT_AMBULATORY_CARE_PROVIDER_SITE_OTHER): Payer: Medicare Other

## 2015-05-17 VITALS — BP 138/76 | HR 64 | Temp 98.0°F | Wt 146.0 lb

## 2015-05-17 DIAGNOSIS — R778 Other specified abnormalities of plasma proteins: Secondary | ICD-10-CM

## 2015-05-17 DIAGNOSIS — I10 Essential (primary) hypertension: Secondary | ICD-10-CM

## 2015-05-17 DIAGNOSIS — J309 Allergic rhinitis, unspecified: Secondary | ICD-10-CM | POA: Diagnosis not present

## 2015-05-17 DIAGNOSIS — E785 Hyperlipidemia, unspecified: Secondary | ICD-10-CM | POA: Diagnosis not present

## 2015-05-17 LAB — HEPATIC FUNCTION PANEL
ALT: 26 U/L (ref 0–35)
AST: 25 U/L (ref 0–37)
Albumin: 4.4 g/dL (ref 3.5–5.2)
Alkaline Phosphatase: 62 U/L (ref 39–117)
BILIRUBIN DIRECT: 0.1 mg/dL (ref 0.0–0.3)
BILIRUBIN TOTAL: 0.4 mg/dL (ref 0.2–1.2)
TOTAL PROTEIN: 7.7 g/dL (ref 6.0–8.3)

## 2015-05-17 LAB — BASIC METABOLIC PANEL
BUN: 19 mg/dL (ref 6–23)
CALCIUM: 9.7 mg/dL (ref 8.4–10.5)
CO2: 26 mEq/L (ref 19–32)
Chloride: 103 mEq/L (ref 96–112)
Creatinine, Ser: 0.84 mg/dL (ref 0.40–1.20)
GFR: 72.32 mL/min (ref >60.00)
GLUCOSE: 94 mg/dL (ref 70–99)
Potassium: 4.1 mEq/L (ref 3.5–5.1)
SODIUM: 135 meq/L (ref 135–145)

## 2015-05-17 LAB — CBC WITH DIFFERENTIAL/PLATELET
BASOS ABS: 0 10*3/uL (ref 0.0–0.1)
Basophils Relative: 0.2 % (ref 0.0–3.0)
EOS PCT: 1.8 % (ref 0.0–5.0)
Eosinophils Absolute: 0.1 10*3/uL (ref 0.0–0.7)
HEMATOCRIT: 40.1 % (ref 36.0–46.0)
Hemoglobin: 13.7 g/dL (ref 12.0–15.0)
LYMPHS PCT: 21 % (ref 12.0–46.0)
Lymphs Abs: 1.3 10*3/uL (ref 0.7–4.0)
MCHC: 34.3 g/dL (ref 30.0–36.0)
MCV: 92.4 fl (ref 78.0–100.0)
MONO ABS: 0.5 10*3/uL (ref 0.1–1.0)
Monocytes Relative: 7.2 % (ref 3.0–12.0)
Neutro Abs: 4.4 10*3/uL (ref 1.4–7.7)
Neutrophils Relative %: 69.8 % (ref 43.0–77.0)
Platelets: 258 10*3/uL (ref 150.0–400.0)
RBC: 4.34 Mil/uL (ref 3.87–5.11)
RDW: 13.2 % (ref 11.5–15.5)
WBC: 6.4 10*3/uL (ref 4.0–10.5)

## 2015-05-17 LAB — TSH: TSH: 1.87 u[IU]/mL (ref 0.35–4.50)

## 2015-05-17 MED ORDER — HYDROCHLOROTHIAZIDE 12.5 MG PO CAPS: 12.5000 mg | ORAL_CAPSULE | Freq: Every day | ORAL | Status: DC

## 2015-05-17 MED ORDER — METOPROLOL TARTRATE 50 MG PO TABS: ORAL_TABLET | ORAL | Status: DC

## 2015-05-17 MED ORDER — FLUTICASONE PROPIONATE 50 MCG/ACT NA SUSP: NASAL | Status: DC

## 2015-05-17 NOTE — Progress Notes (Signed)
Pre visit review using our clinic review tool, if applicable. No additional management support is needed unless otherwise documented below in the visit note. 

## 2015-05-17 NOTE — Assessment & Plan Note (Signed)
BMET 

## 2015-05-17 NOTE — Patient Instructions (Addendum)
Minimal Blood Pressure Goal= AVERAGE < 140/90;  Ideal is an AVERAGE < 135/85. This AVERAGE should be calculated from @ least 5-7 BP readings taken @ different times of day on different days of week. You should not respond to isolated BP readings , but rather the AVERAGE for that week .Please bring your  blood pressure cuff to office visits to verify that it is reliable.It  can also be checked against the blood pressure device at the pharmacy. Finger or wrist cuffs are not dependable; an arm cuff is. Your next office appointment will be determined based upon review of your pending labs  Those instructions will be transmitted to you by mail Critical results will be called. Followup as needed for any active or acute issue. Please report any significant change in your symptoms.  Plain Mucinex (NOT D) for thick secretions ;force NON dairy fluids .   Nasal cleansing in the shower as discussed with lather of mild shampoo.After 10 seconds wash off lather while  exhaling through nostrils. Make sure that all residual soap is removed to prevent irritation.  Flonase OR Nasacort AQ 1 spray in each nostril twice a day as needed. Use the "crossover" technique into opposite nostril spraying toward opposite ear @ 45 degree angle, not straight up into nostril.  Plain Allegra (NOT D )  160 daily , Loratidine 10 mg , OR Zyrtec 10 mg @ bedtime  as needed for itchy eyes & sneezing.

## 2015-05-17 NOTE — Progress Notes (Signed)
   Subjective:    Patient ID: Chloe Young, female    DOB: 11/02/1951, 64 y.o.   MRN: [id number]  HPI The patient is here to assess status of active health conditions.  PMH, FH, & Social History reviewed & updated.   She is on a Weight Watchers' diet. She walks 45-60 minutes 6-7 times a week without cardiopulmonary symptoms. She does not check her blood pressure at home.  PMH includes mild elevation of total protein.  Review of Systems  Chest pain, palpitations, tachycardia, exertional dyspnea, paroxysmal nocturnal dyspnea, claudication or edema are absent.  She does have extrinsic allergic symptoms which have been responsive to Flonase. This is described as watery, itchy eyes, sneezing, and clear nasal discharge.        Objective:   Physical Exam  Pertinent or positive findings include: Deep tendon reflexes are 0+ at the knees.  She has decreased range of motion at the knees and mild crepitus.  There is a 12 x 8 mm keratotic lesion over the right upper extremity laterally.  General appearance :adequately nourished; in no distress. Eyes: No conjunctival inflammation or scleral icterus is present. Oral exam:  Lips and gums are healthy appearing.There is no oropharyngeal erythema or exudate noted. Dental hygiene is good. Heart:  Normal rate and regular rhythm. S1 and S2 normal without gallop, murmur, click, rub or other extra sounds   Lungs:Chest clear to auscultation; no wheezes, rhonchi,rales ,or rubs present.No increased work of breathing.  Abdomen: bowel sounds normal, soft and non-tender without masses, organomegaly or hernias noted.  No guarding or rebound.  Vascular : all pulses equal ; no bruits present. Skin:Warm & dry.  Intact without suspicious lesions or rashes ; no tenting or jaundice  Lymphatic: No lymphadenopathy is noted about the head, neck, axilla Neuro: Strength, tone & DTRs normal.        Assessment & Plan:  See Current Assessment & Plan in Problem List  under specific Diagnosis

## 2015-05-17 NOTE — Assessment & Plan Note (Signed)
Blood pressure goals reviewed. BMET 

## 2015-05-17 NOTE — Assessment & Plan Note (Signed)
NMR Lipoprofile, LFT, TSH

## 2015-05-18 ENCOUNTER — Telehealth: Payer: Self-pay | Admitting: Internal Medicine

## 2015-05-18 ENCOUNTER — Other Ambulatory Visit: Payer: Self-pay

## 2015-05-18 DIAGNOSIS — M791 Myalgia: Secondary | ICD-10-CM | POA: Diagnosis not present

## 2015-05-18 DIAGNOSIS — M9903 Segmental and somatic dysfunction of lumbar region: Secondary | ICD-10-CM | POA: Diagnosis not present

## 2015-05-18 DIAGNOSIS — J309 Allergic rhinitis, unspecified: Secondary | ICD-10-CM

## 2015-05-18 DIAGNOSIS — I10 Essential (primary) hypertension: Secondary | ICD-10-CM

## 2015-05-18 DIAGNOSIS — M9905 Segmental and somatic dysfunction of pelvic region: Secondary | ICD-10-CM | POA: Diagnosis not present

## 2015-05-18 MED ORDER — HYDROCHLOROTHIAZIDE 12.5 MG PO CAPS: 12.5000 mg | ORAL_CAPSULE | Freq: Every day | ORAL | Status: DC

## 2015-05-18 MED ORDER — FLUTICASONE PROPIONATE 50 MCG/ACT NA SUSP: NASAL | Status: DC

## 2015-05-18 MED ORDER — METOPROLOL TARTRATE 50 MG PO TABS: ORAL_TABLET | ORAL | Status: DC

## 2015-05-18 NOTE — Telephone Encounter (Signed)
Patient was wanting lipid profile results which have not resulted yet, i advised patient that hopp would review those results once they have a final result and labs will be mailed to her

## 2015-05-18 NOTE — Telephone Encounter (Signed)
Please call patient for lab results 

## 2015-05-19 LAB — NMR LIPOPROFILE WITH LIPIDS
CHOLESTEROL, TOTAL: 240 mg/dL — AB (ref 100–199)
HDL PARTICLE NUMBER: 44 umol/L (ref >=30.5)
HDL SIZE: 9.4 nm (ref >=9.2)
HDL-C: 76 mg/dL (ref >39)
LARGE HDL: 9.7 umol/L (ref >=4.8)
LDL CALC: 148 mg/dL — AB (ref 0–99)
LDL Particle Number: 1557 nmol/L — ABNORMAL HIGH (ref <1000)
LDL SIZE: 21.8 nm (ref >=20.8)
LP-IR Score: <25 (ref <=45)
Large VLDL-P: 0.9 nmol/L (ref <=2.7)
SMALL LDL PARTICLE NUMBER: 292 nmol/L (ref <=527)
TRIGLYCERIDES: 79 mg/dL (ref 0–149)
VLDL Size: 40.2 nm (ref <=46.6)

## 2015-05-20 ENCOUNTER — Other Ambulatory Visit: Payer: Self-pay

## 2015-05-20 DIAGNOSIS — J309 Allergic rhinitis, unspecified: Secondary | ICD-10-CM

## 2015-05-20 DIAGNOSIS — I10 Essential (primary) hypertension: Secondary | ICD-10-CM

## 2015-05-20 MED ORDER — FLUTICASONE PROPIONATE 50 MCG/ACT NA SUSP: NASAL | Status: DC

## 2015-05-20 MED ORDER — METOPROLOL TARTRATE 50 MG PO TABS
ORAL_TABLET | ORAL | Status: DC
Start: 2015-05-20 — End: 2016-03-13

## 2015-05-20 MED ORDER — HYDROCHLOROTHIAZIDE 12.5 MG PO CAPS: 12.5000 mg | ORAL_CAPSULE | Freq: Every day | ORAL | Status: DC

## 2015-05-25 NOTE — Telephone Encounter (Signed)
Pt would like a call back from nurse with her blood results

## 2015-05-28 NOTE — Telephone Encounter (Signed)
Patient has called back about labs.  I gave her DR. Hoppers response on lab results but that it was pending Cholesterol.  Patient would like call back with those results if possible.

## 2015-05-31 DIAGNOSIS — M791 Myalgia: Secondary | ICD-10-CM | POA: Diagnosis not present

## 2015-05-31 DIAGNOSIS — Z1231 Encounter for screening mammogram for malignant neoplasm of breast: Secondary | ICD-10-CM | POA: Diagnosis not present

## 2015-05-31 DIAGNOSIS — M9905 Segmental and somatic dysfunction of pelvic region: Secondary | ICD-10-CM | POA: Diagnosis not present

## 2015-05-31 DIAGNOSIS — M9903 Segmental and somatic dysfunction of lumbar region: Secondary | ICD-10-CM | POA: Diagnosis not present

## 2015-06-01 ENCOUNTER — Encounter: Payer: Self-pay | Admitting: Gynecology

## 2015-06-08 ENCOUNTER — Telehealth: Payer: Self-pay

## 2015-06-08 NOTE — Telephone Encounter (Signed)
i advised patient of dr hoppers note, patient also states that she is NOT using my chart even though her status was saying active, patient stated she wanted active status for my chart to be removed, she had problems signing on with her password and then she had problems with her computer, so she prefers that we mail information to her, lab results also mailed to patient per patient request

## 2015-06-14 ENCOUNTER — Telehealth: Payer: Self-pay | Admitting: Internal Medicine

## 2015-06-14 NOTE — Telephone Encounter (Signed)
Can you please call patient regarding her cholesterol at [phone]

## 2015-06-21 DIAGNOSIS — M791 Myalgia: Secondary | ICD-10-CM | POA: Diagnosis not present

## 2015-06-21 DIAGNOSIS — M9903 Segmental and somatic dysfunction of lumbar region: Secondary | ICD-10-CM | POA: Diagnosis not present

## 2015-06-21 DIAGNOSIS — M9905 Segmental and somatic dysfunction of pelvic region: Secondary | ICD-10-CM | POA: Diagnosis not present

## 2015-06-22 ENCOUNTER — Ambulatory Visit (INDEPENDENT_AMBULATORY_CARE_PROVIDER_SITE_OTHER): Payer: Medicare Other | Admitting: Internal Medicine

## 2015-06-22 ENCOUNTER — Encounter: Payer: Self-pay | Admitting: Internal Medicine

## 2015-06-22 VITALS — BP 116/82 | HR 65 | Temp 98.1°F | Resp 16 | Wt 145.0 lb

## 2015-06-22 DIAGNOSIS — E785 Hyperlipidemia, unspecified: Secondary | ICD-10-CM | POA: Diagnosis not present

## 2015-06-22 NOTE — Progress Notes (Signed)
Pre visit review using our clinic review tool, if applicable. No additional management support is needed unless otherwise documented below in the visit note. 

## 2015-06-22 NOTE — Patient Instructions (Signed)
Please take enteric-coated aspirin 81 mg daily after breakfast.  Consider the risk we discussed; monitor fasting lipids annually.

## 2015-06-22 NOTE — Progress Notes (Signed)
   Subjective:    Patient ID: Chloe Young, female    DOB: 30-Jun-1951, 64 y.o.   MRN: [id number]  HPI  Advanced cholesterol testing was reviewed in detail with her.  Based on this her LDL goal is 125, ideally 95 or less. Her LDL was 148. HDL was excellent at 76.  She continues a heart healthy diet. She is walking 30-60 minutes most days without cardiopulmonary symptoms. 4 maternal uncles had coronary disease; one had a heart attack prior to age 89. Paternal grandfather had a stroke at 33.   Review of Systems Chest pain, palpitations, tachycardia, exertional dyspnea, paroxysmal nocturnal dyspnea, claudication or edema are absent.      Objective:   Physical Exam  Appears healthy and well-nourished & in no acute distress  No carotid bruits are present.No neck vein distention present at 10 - 15 degrees. Thyroid normal to palpation  Heart rhythm and rate are normal with no gallop or murmur  Chest is clear with no increased work of breathing  There is no evidence of aortic aneurysm or renal artery bruits  Abdomen soft with no organomegaly or masses. No HJR  No clubbing, cyanosis or edema present.  Pedal pulses are intact   No ischemic skin changes are present . Fingernails/ toenails healthy   Alert and oriented. Strength, tone, DTRs reflexes normal          Assessment & Plan:  #1 dyslipidemia; pathophysiology and risks discussed.  Plan: She is to continue the excellent nutrition excise program. Baby aspirin after breakfast was recommended

## 2015-07-05 DIAGNOSIS — M9903 Segmental and somatic dysfunction of lumbar region: Secondary | ICD-10-CM | POA: Diagnosis not present

## 2015-07-05 DIAGNOSIS — M9905 Segmental and somatic dysfunction of pelvic region: Secondary | ICD-10-CM | POA: Diagnosis not present

## 2015-07-05 DIAGNOSIS — M791 Myalgia: Secondary | ICD-10-CM | POA: Diagnosis not present

## 2015-07-26 ENCOUNTER — Telehealth: Payer: Self-pay | Admitting: *Deleted

## 2015-07-26 NOTE — Telephone Encounter (Signed)
PA done online for combipatch 0.05mg -0.25 mg, will wait for response.

## 2015-07-28 ENCOUNTER — Encounter: Payer: Self-pay | Admitting: *Deleted

## 2015-07-28 NOTE — Telephone Encounter (Signed)
combi-patch was denied, will file appeal letter to see if medication can be approved.

## 2015-08-06 NOTE — Telephone Encounter (Signed)
Appeal approved effective 07/26/15-12/25/15 case # [id number]

## 2015-08-20 ENCOUNTER — Other Ambulatory Visit: Payer: Self-pay | Admitting: Emergency Medicine

## 2015-08-20 DIAGNOSIS — J309 Allergic rhinitis, unspecified: Secondary | ICD-10-CM

## 2015-08-20 MED ORDER — FLUTICASONE PROPIONATE 50 MCG/ACT NA SUSP: NASAL | Status: DC

## 2015-08-24 ENCOUNTER — Other Ambulatory Visit: Payer: Self-pay

## 2015-08-24 MED ORDER — ESTRADIOL-NORETHINDRONE ACET 0.05-0.25 MG/DAY TD PTTW: MEDICATED_PATCH | TRANSDERMAL | Status: DC

## 2015-08-31 DIAGNOSIS — Z471 Aftercare following joint replacement surgery: Secondary | ICD-10-CM | POA: Diagnosis not present

## 2015-08-31 DIAGNOSIS — Z96652 Presence of left artificial knee joint: Secondary | ICD-10-CM | POA: Diagnosis not present

## 2015-10-22 ENCOUNTER — Telehealth: Payer: Self-pay | Admitting: Internal Medicine

## 2015-10-22 NOTE — Telephone Encounter (Signed)
Pt called and is wanting to know if she should get the pneumonia vaccination.  She is also curious about her husband. Chloe Young 02/07/46. It looks like he had the 23 back in 2012  Please advise   She can be reached at [phone]

## 2015-10-23 NOTE — Telephone Encounter (Signed)
Will you call and schedule both for AWV/CPE.   They can both get the pneumonia vaccine (Prevnar 13).

## 2015-10-27 NOTE — Telephone Encounter (Signed)
Pt informed and will call back

## 2015-11-30 ENCOUNTER — Telehealth: Payer: Self-pay | Admitting: Internal Medicine

## 2015-11-30 DIAGNOSIS — J309 Allergic rhinitis, unspecified: Secondary | ICD-10-CM

## 2015-11-30 MED ORDER — FLUTICASONE PROPIONATE 50 MCG/ACT NA SUSP: NASAL | Status: DC

## 2015-11-30 NOTE — Telephone Encounter (Signed)
Pt requesting refill for fluticasone (FLONASE) 50 MCG/ACT nasal spray [id number] 3 mos supply Pharmacy is [facility]

## 2015-11-30 NOTE — Telephone Encounter (Signed)
rx has been sent to [facility]...Johny Chess

## 2016-01-04 ENCOUNTER — Other Ambulatory Visit: Payer: Self-pay | Admitting: Gynecology

## 2016-03-13 ENCOUNTER — Other Ambulatory Visit: Payer: Self-pay | Admitting: Internal Medicine

## 2016-05-01 ENCOUNTER — Other Ambulatory Visit: Payer: Self-pay | Admitting: Internal Medicine

## 2016-06-06 DIAGNOSIS — M5417 Radiculopathy, lumbosacral region: Secondary | ICD-10-CM | POA: Diagnosis not present

## 2016-06-15 ENCOUNTER — Encounter: Payer: Self-pay | Admitting: Internal Medicine

## 2016-06-15 ENCOUNTER — Telehealth: Payer: Self-pay

## 2016-06-15 ENCOUNTER — Ambulatory Visit (INDEPENDENT_AMBULATORY_CARE_PROVIDER_SITE_OTHER): Payer: Medicare Other | Admitting: Internal Medicine

## 2016-06-15 ENCOUNTER — Other Ambulatory Visit (INDEPENDENT_AMBULATORY_CARE_PROVIDER_SITE_OTHER): Payer: Medicare Other

## 2016-06-15 VITALS — BP 126/80 | HR 63 | Temp 97.9°F | Resp 16 | Ht 63.0 in | Wt 144.0 lb

## 2016-06-15 DIAGNOSIS — M810 Age-related osteoporosis without current pathological fracture: Secondary | ICD-10-CM | POA: Insufficient documentation

## 2016-06-15 DIAGNOSIS — M542 Cervicalgia: Secondary | ICD-10-CM

## 2016-06-15 DIAGNOSIS — K219 Gastro-esophageal reflux disease without esophagitis: Secondary | ICD-10-CM

## 2016-06-15 DIAGNOSIS — Z1159 Encounter for screening for other viral diseases: Secondary | ICD-10-CM | POA: Diagnosis not present

## 2016-06-15 DIAGNOSIS — R51 Headache: Secondary | ICD-10-CM | POA: Diagnosis not present

## 2016-06-15 DIAGNOSIS — Z1283 Encounter for screening for malignant neoplasm of skin: Secondary | ICD-10-CM

## 2016-06-15 DIAGNOSIS — M5416 Radiculopathy, lumbar region: Secondary | ICD-10-CM | POA: Insufficient documentation

## 2016-06-15 DIAGNOSIS — I10 Essential (primary) hypertension: Secondary | ICD-10-CM

## 2016-06-15 DIAGNOSIS — Z23 Encounter for immunization: Secondary | ICD-10-CM | POA: Diagnosis not present

## 2016-06-15 DIAGNOSIS — M8580 Other specified disorders of bone density and structure, unspecified site: Secondary | ICD-10-CM

## 2016-06-15 DIAGNOSIS — G4486 Cervicogenic headache: Secondary | ICD-10-CM | POA: Insufficient documentation

## 2016-06-15 LAB — CBC WITH DIFFERENTIAL/PLATELET
BASOS ABS: 0 10*3/uL (ref 0.0–0.1)
Basophils Relative: 0.2 % (ref 0.0–3.0)
EOS ABS: 0.1 10*3/uL (ref 0.0–0.7)
Eosinophils Relative: 1.2 % (ref 0.0–5.0)
HEMATOCRIT: 38.6 % (ref 36.0–46.0)
HEMOGLOBIN: 13.1 g/dL (ref 12.0–15.0)
LYMPHS PCT: 30 % (ref 12.0–46.0)
Lymphs Abs: 1.7 10*3/uL (ref 0.7–4.0)
MCHC: 33.9 g/dL (ref 30.0–36.0)
MCV: 95.6 fl (ref 78.0–100.0)
Monocytes Absolute: 0.5 10*3/uL (ref 0.1–1.0)
Monocytes Relative: 8.9 % (ref 3.0–12.0)
Neutro Abs: 3.3 10*3/uL (ref 1.4–7.7)
Neutrophils Relative %: 59.7 % (ref 43.0–77.0)
Platelets: 218 10*3/uL (ref 150.0–400.0)
RBC: 4.04 Mil/uL (ref 3.87–5.11)
RDW: 13.8 % (ref 11.5–15.5)
WBC: 5.6 10*3/uL (ref 4.0–10.5)

## 2016-06-15 LAB — COMPREHENSIVE METABOLIC PANEL
ALBUMIN: 4.1 g/dL (ref 3.5–5.2)
ALT: 30 U/L (ref 0–35)
AST: 32 U/L (ref 0–37)
Alkaline Phosphatase: 46 U/L (ref 39–117)
BILIRUBIN TOTAL: 0.5 mg/dL (ref 0.2–1.2)
BUN: 11 mg/dL (ref 6–23)
CALCIUM: 9.5 mg/dL (ref 8.4–10.5)
CO2: 33 meq/L — AB (ref 19–32)
CREATININE: 0.75 mg/dL (ref 0.40–1.20)
Chloride: 103 mEq/L (ref 96–112)
GFR: 82.15 mL/min (ref >60.00)
Glucose, Bld: 91 mg/dL (ref 70–99)
Potassium: 3.7 mEq/L (ref 3.5–5.1)
SODIUM: 139 meq/L (ref 135–145)
Total Protein: 6.9 g/dL (ref 6.0–8.3)

## 2016-06-15 LAB — LIPID PANEL
CHOL/HDL RATIO: 3
CHOLESTEROL: 232 mg/dL — AB (ref 0–200)
HDL: 74 mg/dL (ref >39.00)
LDL Cholesterol: 146 mg/dL — ABNORMAL HIGH (ref 0–99)
NonHDL: 158.04
TRIGLYCERIDES: 61 mg/dL (ref 0.0–149.0)
VLDL: 12.2 mg/dL (ref 0.0–40.0)

## 2016-06-15 LAB — TSH: TSH: 1.19 u[IU]/mL (ref 0.35–4.50)

## 2016-06-15 MED ORDER — OMEPRAZOLE 20 MG PO CPDR: 20.0000 mg | DELAYED_RELEASE_CAPSULE | Freq: Every day | ORAL | Status: DC

## 2016-06-15 NOTE — Assessment & Plan Note (Signed)
Taking topamax daily Headaches are controlled

## 2016-06-15 NOTE — Assessment & Plan Note (Signed)
Following with pain management Improved with lyrica

## 2016-06-15 NOTE — Patient Instructions (Signed)
  Test(s) ordered today. Your results will be released to [place] (or called to you) after review, usually within 72hours after test completion. If any changes need to be made, you will be notified at that same time.  All other Health Maintenance issues reviewed.   All recommended immunizations and age-appropriate screenings are up-to-date or discussed.  Prevnar vaccine administered today.   Medications reviewed and updated.  No changes recommended at this time.   A referral for derm was ordered for a skin check.   Please followup in one year for a physical exam.

## 2016-06-15 NOTE — Assessment & Plan Note (Addendum)
BP well controlled Current regimen effective and well tolerated Continue current medications at current doses cmp  

## 2016-06-15 NOTE — Assessment & Plan Note (Signed)
Last dexa 2005 dexa ordered Continue calcium, vitamin d Continue regular exercise

## 2016-06-15 NOTE — Assessment & Plan Note (Signed)
GERD controlled Continue daily medication  

## 2016-06-15 NOTE — Progress Notes (Signed)
Pre visit review using our clinic review tool, if applicable. No additional management support is needed unless otherwise documented below in the visit note. 

## 2016-06-15 NOTE — Assessment & Plan Note (Signed)
Following with pain management Takes topamax for associated headaches Takes norco if needed

## 2016-06-15 NOTE — Progress Notes (Signed)
Subjective:    Patient ID: Chloe Young, female    DOB: 11-19-1951, 65 y.o.   MRN: [id number]  HPI She is here to establish with a new pcp.  She is here for follow up.  She retired last month.  She was a Emergency planning/management officer.    Hypertension: She is taking her medication daily. She is compliant with a low sodium diet.  She denies chest pain, palpitations, edema, shortness of breath and regular headaches. She is exercising regularly, but not as much since her back pain flared up.  She does not monitor her blood pressure at home.    GERD:  She is taking her medication daily as prescribed.  She denies any GERD symptoms and feels her GERD is well controlled.   Lower back pain with sciatica:  She has had a recent injection.  She also has tendinitis inher right calf.  She is taking lyrica and that seems to be helping.  She is following with pain management.  Neck pain, s/p cervical fusion:  She has occasional neck pain.  She has headaches at times and takes topamax, which has helped.  She takes the hydrocodone only as needed.  She follows with pain management.    Medications and allergies reviewed with patient and updated if appropriate.  Patient Active Problem List   Diagnosis Date Noted  . Fasting hyperglycemia 11/25/2013  . Nonspecific abnormal electrocardiogram (ECG) (EKG) 05/28/2012  . Cervical post-laminectomy syndrome 03/18/2012  . Osteoarthritis of both knees 03/18/2012  . RAYNAUD'S SYNDROME 10/11/2010  . NECK PAIN, CHRONIC 04/19/2010  . Hyperlipidemia 04/19/2009  . Essential hypertension 04/19/2009  . [place] DISEASE, CERVICAL 04/19/2009    Current Outpatient Prescriptions on File Prior to Visit  Medication Sig Dispense Refill  . Calcium Carbonate-Vitamin D (CALCIUM + D PO) Take 1 tablet by mouth daily.     Marland Kitchen estradiol-norethindrone (COMBIPATCH) 0.05-0.25 MG/DAY APPLY ONE PATCH TWICE A WEEK. 24 patch 0  . fluticasone (FLONASE) 50 MCG/ACT nasal spray USE 1 SPRAY IN EACH NOSTRIL TWICE  A DAY. 48 g 0  . hydrochlorothiazide (MICROZIDE) 12.5 MG capsule Take 1 capsule by mouth  daily 90 capsule 0  . HYDROcodone-acetaminophen (NORCO) 7.5-325 MG per tablet Take 1 tablet by mouth every 6 (six) hours as needed.    . metoprolol (LOPRESSOR) 50 MG tablet Take one-half tablet by  mouth twice a day 90 tablet 0  . Multiple Vitamin (MULITIVITAMIN WITH MINERALS) TABS Take 1 tablet by mouth daily.     Marland Kitchen topiramate (TOPAMAX) 50 MG tablet Take 50 mg by mouth 2 (two) times daily.     No current facility-administered medications on file prior to visit.    Past Medical History  Diagnosis Date  . Cervical post-laminectomy syndrome     Dr Tessa Lerner[facility]  . Cervical facet syndrome   . Cervicalgia   . Chronic pain syndrome   . Osteoarthritis, localized, knee     OA AND PAIN LEFT KNEE  -- S/P RIGHT  KNEE ARTHROPLASTY ON 82012--KNEE IS DOING WELL.  Marland Kitchen Hypertension   . Osteopenia 04/2012    T score -1.2 FRAX 8.9%/0.7%   . Hyperlipidemia     joint pain with statin    Past Surgical History  Procedure Laterality Date  . Cervial fusion  2006    Dr Arnoldo Morale, [place]  . Knee arthroscopy      bilaterally; Dr Tonita Cong  . Joint replacement  AUG 2012     RIGHT KNEE ARTHROPLASTY  .  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 Esi  09/03/13    L 5- S1 ; Dr Nelva Bush  . Colonoscopy  2011     Dr Fuller Plan    Social History   Social History  . Marital Status: Married    Spouse Name: N/A  . Number of Children: 1  . Years of Education: N/A   Occupational History  . HAIRDRESSER    Social History Main Topics  . Smoking status: Never Smoker   . Smokeless tobacco: Never Used  . Alcohol Use: 8.4 oz/week    14 Shots of liquor per week     Comment: 2 DRINKS PER NIGHT  . Drug Use: No  . Sexual Activity: Yes    Birth Control/ Protection:  Post-menopausal   Other Topics Concern  . None   Social History Narrative    Family History  Problem Relation Age of Onset  . Diverticulitis Mother 29    with fissures.Marland Kitchenopted for no surgery  . Cancer Father 66     malignant brain tumor  . Breast cancer Maternal Aunt   . Diabetes Maternal Grandfather   . Heart failure Maternal Grandfather   . Stroke Maternal Uncle     in 37s  . Heart disease Maternal Uncle     X4 ; 1 had MI @ 36  . Hypertension Sister     Review of Systems  Constitutional: Negative for fever and chills.  Respiratory: Negative for cough, shortness of breath and wheezing.   Cardiovascular: Negative for chest pain, palpitations and leg swelling.  Gastrointestinal: Negative for abdominal pain.       No gerd  Musculoskeletal: Positive for back pain (with pain in right leg) and neck pain (on occasion).  Neurological: Negative for dizziness, light-headedness and headaches.       Objective:   Filed Vitals:   06/15/16 1051  BP: 126/80  Pulse: 63  Temp: 97.9 F (36.6 C)  Resp: 16   Filed Weights   06/15/16 1051  Weight: 144 lb (65.318 kg)   Body mass index is 25.51 kg/(m^2).   Physical Exam Constitutional: Appears well-developed and well-nourished. No distress.  Neck: Neck supple. No tracheal deviation present. No thyromegaly present.  No carotid bruit. No cervical adenopathy.   Cardiovascular: Normal rate, regular rhythm and normal heart sounds.   No murmur heard.  No edema Pulmonary/Chest: Effort normal and breath sounds normal. No respiratory distress. No wheezes.       Assessment & Plan:    See Problem List for Assessment and Plan of chronic medical problems.  F/u annually

## 2016-06-15 NOTE — Telephone Encounter (Signed)
Fax number is [phone] Patient needs a referral to [facility] to have her done density test.

## 2016-06-16 LAB — HEPATITIS C ANTIBODY: HCV Ab: NEGATIVE

## 2016-06-16 NOTE — Telephone Encounter (Signed)
Called [facility] and the scan was already scheduled and they had an order from a different physician.  That physician and Dr. Quay Burow will get the report. I went ahead and removed the order from her chart

## 2016-06-19 NOTE — Telephone Encounter (Signed)
noted 

## 2016-06-24 DIAGNOSIS — M8580 Other specified disorders of bone density and structure, unspecified site: Secondary | ICD-10-CM

## 2016-06-24 HISTORY — DX: Other specified disorders of bone density and structure, unspecified site: M85.80

## 2016-06-26 DIAGNOSIS — M8589 Other specified disorders of bone density and structure, multiple sites: Secondary | ICD-10-CM | POA: Diagnosis not present

## 2016-06-26 DIAGNOSIS — Z1231 Encounter for screening mammogram for malignant neoplasm of breast: Secondary | ICD-10-CM | POA: Diagnosis not present

## 2016-06-26 LAB — HM DEXA SCAN

## 2016-06-30 ENCOUNTER — Encounter: Payer: Self-pay | Admitting: Gynecology

## 2016-07-03 ENCOUNTER — Encounter: Payer: Self-pay | Admitting: Gynecology

## 2016-07-04 ENCOUNTER — Encounter: Payer: Self-pay | Admitting: Internal Medicine

## 2016-07-04 ENCOUNTER — Telehealth: Payer: Self-pay

## 2016-07-04 DIAGNOSIS — M8580 Other specified disorders of bone density and structure, unspecified site: Secondary | ICD-10-CM

## 2016-07-04 NOTE — Telephone Encounter (Signed)
Pt called and wanted the Dexa results. Pt informed that Dr. Phineas Real was the provider [facility] released the results to.  Pt wanted you to be the provider the results were released to.   Can you look at the exam notes and give pt recommendations?  pls advise.

## 2016-07-04 NOTE — Telephone Encounter (Signed)
Results are in your folder for review.

## 2016-07-04 NOTE — Telephone Encounter (Signed)
Bone density shows osteopenia. There has been decrease in her bone density in her hips compared to 2005. The bone density in her spine has improved since 2005. At this point I think she should continue her calcium, vitamin D and exercise. I would recommend rechecking a bone density in approximately 2 years.

## 2016-07-05 NOTE — Telephone Encounter (Signed)
Spoke to pt to inform

## 2016-07-08 ENCOUNTER — Telehealth: Payer: Self-pay

## 2016-07-08 NOTE — Telephone Encounter (Signed)
Patient is on the list for [facility] 2017 and may be a good candidate for an AWV in 2017. Please let me know if/when appt is scheduled.   

## 2016-07-10 NOTE — Telephone Encounter (Signed)
Call to [name]; She has been on medicare x 1 year and this is the beginning of her 2nd year. Agreed to AWV  After vacation Aug 1st at 10am

## 2016-07-12 ENCOUNTER — Encounter: Payer: Self-pay | Admitting: Gynecology

## 2016-07-24 NOTE — Progress Notes (Addendum)
Subjective:   Chloe Young is a 65 y.o. female who presents for an Initial Medicare Annual Wellness Visit.   HRA assessment completed during this visit with [name]  The Patient was informed that the wellness visit is to identify future health risk and educate and initiate measures that can reduce risk for increased disease through the lifespan.    Feels health is good NO ROS; Medicare Wellness Visit Last OV:  05/2016 Labs completed: same date Lipids ldl  146/ herditary HTN/ very good today Osteopenia; education given  Psychosocial: (Dad Cancer brain; sister htn; hx breat cancer and DM)  Lives with spouse Chloe Young children in [place];  Son pilot Recently retired Emergency planning/management officer  Medications reviewed: no issues noted   BMI: 25   Diet;   Eats a lot of chicken; never fries Vegetables;  Light breakfast; Light lunch Eats dinner; doesn't go out as much  Loves fruit Check into weight watchers x 1  Month / keeps weight stable Measurable sweets if any Joined in 2004   Exercise;  Walk at least dog in am;  When cooler at hs Going to the gym; not as often now due to injury; Had to have injection in back; Aches and pains; vionic shoes;  Bending back the wrong way;  Discussed exercise safety and keeping it simple  Sit down elliptical; rowing x 10 to 15 minutes   HOME SAFETY Mostly one level; Few stairs but not an issue Bathroom safety reviewed  Young to stay in place  Personal safety issues reviewed for risk such as safe community; smoke detector; firearms safety if applicable; protection when in the sun; (yes) driving safety for seniors or any recent accidents. no  Fall hx; no  Always fear fallen; broken bones on ice in the past  UA or BOWEL incontinence; no  Functional losses from last year to this year? no Given education on "Fall Prevention in the Home" for more safety tips the patient can apply as appropriate.    Risk for Depression reviewed: no  Any emotional  problems? Anxious, depressed, irritable, sad or blue? no Denies feeling depressed or hopeless; voices pleasure in daily life How many social activities have you been engaged in within the last 2 weeks? No Would like to see grands; 2 and 58 yo Retired she is going q 6 weeks or so;   Sleep pattern; yes, most of the time; Cervical fusion  Has good pillow;   Cognitive; memory;  Manages checkbook, medications; no failures of task Ad8 score reviewed for issues;  Issues making decisions; no  Less interest in hobbies / activities" no  Repeats questions, stories; family complaining: NO  Trouble using ordinary gadgets; microwave; computer: no  Forgets the month or year: no  Mismanaging finances: no  Missing apt: no but does write them down  Daily problems with thinking of memory NO Ad8 score is 0   Advanced Directive addressed;  Educated   Counseling Health Maintenance Gaps:  Colonoscopy; 05/2010/ 05/2020 EKG: 06/2012 Mammogram: 06/2012/ Chloe Young 06/2016- neg Dexa/ 06/2016 -1.6 Strength building;  On hormones; currently; will discuss with GYN  PAP: educated regarding the need for GYN exam;   Hearing: no issues at present  Ophthalmology exam/ will have them examined; Chloe Young eye    Immunizations Due: (Vaccines reviewed and educated regarding any overdue)    Established and updated Risk reviewed and appropriate referral made or health recommendations:  Current Care Team reviewed and updated  Cardiac Risk Factors include: advanced age (>  81men, >65 women);dyslipidemia;hypertension     Objective:    Today's Vitals   07/25/16 1019  BP: 116/70  Pulse: 64  SpO2: 94%  Weight: 141 lb (64 kg)  Height: 5\' 3"  (1.6 m)   Body mass index is 24.98 kg/m.   Current Medications (verified) Outpatient Encounter Prescriptions as of 07/25/2016  Medication Sig  . Calcium Carbonate-Vitamin D (CALCIUM + D PO) Take 1 tablet by mouth daily.   Marland Kitchen estradiol-norethindrone (COMBIPATCH)  0.05-0.25 MG/DAY APPLY ONE PATCH TWICE A WEEK.  . fluticasone (FLONASE) 50 MCG/ACT nasal spray USE 1 SPRAY IN EACH NOSTRIL TWICE A DAY.  . hydrochlorothiazide (MICROZIDE) 12.5 MG capsule Take 1 capsule by mouth  daily  . HYDROcodone-acetaminophen (NORCO) 7.5-325 MG per tablet Take 1 tablet by mouth every 6 (six) hours as needed.  . metoprolol (LOPRESSOR) 50 MG tablet Take one-half tablet by  mouth twice a day  . Multiple Vitamin (MULITIVITAMIN WITH MINERALS) TABS Take 1 tablet by mouth daily.   Marland Kitchen omeprazole (PRILOSEC) 20 MG capsule Take 1 capsule (20 mg total) by mouth daily.  Marland Kitchen topiramate (TOPAMAX) 100 MG tablet Take 2 tablets by mouth 2 (two) times daily.  Marland Kitchen LYRICA 150 MG capsule    No facility-administered encounter medications on file as of 07/25/2016.     Allergies (verified) Review of patient's allergies indicates no known allergies.   History: Past Medical History:  Diagnosis Date  . Cervical facet syndrome   . Cervical post-laminectomy syndrome    Dr [facility]  . Cervicalgia   . Chronic pain syndrome   . Hyperlipidemia    joint pain with statin  . Hypertension   . Osteoarthritis, localized, knee    OA AND PAIN LEFT KNEE  -- S/P RIGHT  KNEE ARTHROPLASTY ON 82012--KNEE IS DOING WELL.  . Osteopenia 06/2016    T score -1.6 FRAX not calculated   Past Surgical History:  Procedure Laterality Date  . CERVIAL FUSION  2006   Dr Chloe [place], [place]  . COLONOSCOPY  2011    Dr Chloe Young  . ESI  09/03/13   L 5- S1 ; Dr Chloe Young  . JOINT REPLACEMENT  AUG 2012    RIGHT KNEE ARTHROPLASTY  . KNEE ARTHROSCOPY     bilaterally; Dr Chloe Young  . TOTAL KNEE ARTHROPLASTY  01/11/2012   Procedure: TOTAL KNEE ARTHROPLASTY;  Surgeon: Chloe Hai, [place];  Location: [facility];  Service: Orthopedics;  Laterality: Left;  General with Femoral nerve block   Family History  Problem Relation Age of Onset  . Diverticulitis Mother 110    with fissures.Marland Kitchenopted for no surgery  . Cancer Father 9     malignant  brain tumor  . Breast cancer Maternal Aunt   . Diabetes Maternal Grandfather   . Heart failure Maternal Grandfather   . Stroke Maternal Uncle     in 47s  . Heart disease Maternal Uncle     X4 ; 1 had MI @ 52  . Hypertension Sister    Social History   Occupational History  . 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 Smoking status: Never Smoker  . Smokeless tobacco: Never Used  . Alcohol use 8.4 oz/week    14 Shots of liquor per week     Comment: 2 DRINKS PER NIGHT  . Drug use: No  . Sexual activity: Yes    Birth control/ protection: Post-menopausal    Tobacco Counseling Counseling given: Yes   Activities of Daily  Living In your present state of health, do you have any difficulty performing the following activities: 07/25/2016  Hearing? N  Vision? N  Difficulty concentrating or making decisions? N  Walking or climbing stairs? N  Dressing or bathing? N  Doing errands, shopping? N  Preparing Food and eating ? N  Using the Toilet? N  In the past six months, have you accidently leaked urine? N  Do you have problems with loss of bowel control? N  Managing your Medications? N  Managing your Finances? N  Housekeeping or managing your Housekeeping? N  Some recent data might be hidden    Immunizations and Health Maintenance Immunization History  Administered Date(s) Administered  . Influenza-Unspecified 11/03/2013, 09/29/2014  . Pneumococcal Conjugate-13 06/15/2016  . Td 04/19/2009  . Zoster 06/04/2012   Health Maintenance Due  Topic Date Due  . INFLUENZA VACCINE  07/25/2016    Patient Care Team: Binnie Rail, MD as PCP - General (Internal Medicine)  Indicate any recent Medical Services you may have received from other than [facility] providers in the past year (date may be approximate).     Assessment:   This is a routine wellness examination for Lorne.   Educated on hyperlipidemia; educated chol foods;  Educated on calcium and Vit D;  given list of foods with calcium; Educated on strength building exercise    Hearing/Vision screen Hearing Screening Comments: Adequate   Dietary issues and exercise activities discussed: Current Exercise Habits: Home exercise routine, Type of exercise: walking (rowing machine; sit down elliptical ), Frequency (Times/Week): 3, Intensity: Moderate  Goals    . patient          Keep active; staying mobile      Depression Screen PHQ 2/9 Scores 07/25/2016 06/15/2016  PHQ - 2 Score 0 0    Fall Risk Fall Risk  07/25/2016 06/15/2016  Falls in the past year? No No    Cognitive Function: No flowsheet data found.  Ad8 score 0   Screening Tests Health Maintenance  Topic Date Due  . INFLUENZA VACCINE  07/25/2016  . PNA vac Low Risk Adult (2 of 2 - PPSV23) 06/15/2017  . MAMMOGRAM  06/26/2018  . DEXA SCAN  06/30/2018  . TETANUS/TDAP  04/20/2019  . COLONOSCOPY  05/31/2020  . ZOSTAVAX  Completed  . Hepatitis C Screening  Completed      Young:   Keep exercising and eating well!  During the course of the visit, Michaiah was educated and counseled about the following appropriate screening and preventive services:   Vaccines to include Pneumoccal, Influenza, Hepatitis B, Td, Zostavax, HCV  Up to date   Electrocardiogram 06/2012  Cardiovascular disease screening/ BP good; BMI good; Exercises  Colorectal cancer screening/ 10 years due 05/2020  Bone density screening/ educated on calcium and Vit D  Diabetes screening/neg  Glaucoma screening/ to have eye exam this year ; will schedule  Mammography/ 06/2016  Nutrition counseling/ weight watchers  Smoking cessation counseling neg Patient Instructions (the written Young) were given to the patient.    [id number], RN   07/25/2016     Medical screening examination/treatment/procedure(s) were performed by non-physician practitioner and as supervising physician I was immediately available for consultation/collaboration. I agree with  above. Binnie Rail, MD

## 2016-07-25 ENCOUNTER — Ambulatory Visit (INDEPENDENT_AMBULATORY_CARE_PROVIDER_SITE_OTHER): Payer: Medicare Other

## 2016-07-25 VITALS — BP 116/70 | HR 64 | Ht 63.0 in | Wt 141.0 lb

## 2016-07-25 DIAGNOSIS — Z0000 Encounter for general adult medical examination without abnormal findings: Secondary | ICD-10-CM

## 2016-07-25 NOTE — Patient Instructions (Addendum)
Chloe Young , Thank you for taking time to come for your Medicare Wellness Visit. I appreciate your ongoing commitment to your health goals. Please review the following plan we discussed and let me know if I can assist you in the future.   Keep exercising;    These are the goals we discussed: Goals    . patient          Keep active; staying mobile       This is a list of the screening recommended for you and due dates:  Health Maintenance  Topic Date Due  . Flu Shot  07/25/2016  . Pneumonia vaccines (2 of 2 - PPSV23) 06/15/2017  . Mammogram  06/26/2018  . DEXA scan (bone density measurement)  06/30/2018  . Tetanus Vaccine  04/20/2019  . Colon Cancer Screening  05/31/2020  . Shingles Vaccine  Completed  .  Hepatitis C: One time screening is recommended by Center for Disease Control  (CDC) for  adults born from [birth year] through 1966.   Completed       Fall Prevention in the Home  Falls can cause injuries. They can happen to people of all ages. There are many things you can do to make your home safe and to help prevent falls.  WHAT CAN I DO ON THE OUTSIDE OF MY HOME?  Regularly fix the edges of walkways and driveways and fix any cracks.  Remove anything that might make you trip as you walk through a door, such as a raised step or threshold.  Trim any bushes or trees on the path to your home.  Use bright outdoor lighting.  Clear any walking paths of anything that might make someone trip, such as rocks or tools.  Regularly check to see if handrails are loose or broken. Make sure that both sides of any steps have handrails.  Any raised decks and porches should have guardrails on the edges.  Have any leaves, snow, or ice cleared regularly.  Use sand or salt on walking paths during winter.  Clean up any spills in your garage right away. This includes oil or grease spills. WHAT CAN I DO IN THE BATHROOM?   Use night lights.  Install grab bars by the toilet and in the tub  and shower. Do not use towel bars as grab bars.  Use non-skid mats or decals in the tub or shower.  If you need to sit down in the shower, use a plastic, non-slip stool.  Keep the floor dry. Clean up any water that spills on the floor as soon as it happens.  Remove soap buildup in the tub or shower regularly.  Attach bath mats securely with double-sided non-slip rug tape.  Do not have throw rugs and other things on the floor that can make you trip. WHAT CAN I DO IN THE BEDROOM?  Use night lights.  Make sure that you have a light by your bed that is easy to reach.  Do not use any sheets or blankets that are too big for your bed. They should not hang down onto the floor.  Have a firm chair that has side arms. You can use this for support while you get dressed.  Do not have throw rugs and other things on the floor that can make you trip. WHAT CAN I DO IN THE KITCHEN?  Clean up any spills right away.  Avoid walking on wet floors.  Keep items that you use a lot in easy-to-reach places.  If you need to reach something above you, use a strong step stool that has a grab bar.  Keep electrical cords out of the way.  Do not use floor polish or wax that makes floors slippery. If you must use wax, use non-skid floor wax.  Do not have throw rugs and other things on the floor that can make you trip. WHAT CAN I DO WITH MY STAIRS?  Do not leave any items on the stairs.  Make sure that there are handrails on both sides of the stairs and use them. Fix handrails that are broken or loose. Make sure that handrails are as long as the stairways.  Check any carpeting to make sure that it is firmly attached to the stairs. Fix any carpet that is loose or worn.  Avoid having throw rugs at the top or bottom of the stairs. If you do have throw rugs, attach them to the floor with carpet tape.  Make sure that you have a light switch at the top of the stairs and the bottom of the stairs. If you do  not have them, ask someone to add them for you. WHAT ELSE CAN I DO TO HELP PREVENT FALLS?  Wear shoes that:  Do not have high heels.  Have rubber bottoms.  Are comfortable and fit you well.  Are closed at the toe. Do not wear sandals.  If you use a stepladder:  Make sure that it is fully opened. Do not climb a closed stepladder.  Make sure that both sides of the stepladder are locked into place.  Ask someone to hold it for you, if possible.  Clearly mark and make sure that you can see:  Any grab bars or handrails.  First and last steps.  Where the edge of each step is.  Use tools that help you move around (mobility aids) if they are needed. These include:  Canes.  Walkers.  Scooters.  Crutches.  Turn on the lights when you go into a dark area. Replace any light bulbs as soon as they burn out.  Set up your furniture so you have a clear path. Avoid moving your furniture around.  If any of your floors are uneven, fix them.  If there are any pets around you, be aware of where they are.  Review your medicines with your doctor. Some medicines can make you feel dizzy. This can increase your chance of falling. Ask your doctor what other things that you can do to help prevent falls.   This information is not intended to replace advice given to you by your health care provider. Make sure you discuss any questions you have with your health care provider.   Document Released: 10/07/2009 Document Revised: 04/27/2015 Document Reviewed: 01/15/2015 Elsevier Interactive Patient Education 2016 [place] Maintenance, Female Adopting a healthy lifestyle and getting preventive care can go a long way to promote health and wellness. Talk with your health care provider about what schedule of regular examinations is right for you. This is a good chance for you to check in with your provider about disease prevention and staying healthy. In between checkups, there are  plenty of things you can do on your own. Experts have done a lot of research about which lifestyle changes and preventive measures are most likely to keep you healthy. Ask your health care provider for more information. WEIGHT AND DIET  Eat a healthy diet  Be sure to include plenty of vegetables, fruits, low-fat dairy  products, and lean protein.  Do not eat a lot of foods high in solid fats, added sugars, or salt.  Get regular exercise. This is one of the most important things you can do for your health.  Most adults should exercise for at least 150 minutes each week. The exercise should increase your heart rate and make you sweat (moderate-intensity exercise).  Most adults should also do strengthening exercises at least twice a week. This is in addition to the moderate-intensity exercise.  Maintain a healthy weight  Body mass index (BMI) is a measurement that can be used to identify possible weight problems. It estimates body fat based on height and weight. Your health care provider can help determine your BMI and help you achieve or maintain a healthy weight.  For females 23 years of age and older:   A BMI below 18.5 is considered underweight.  A BMI of 18.5 to 24.9 is normal.  A BMI of 25 to 29.9 is considered overweight.  A BMI of 30 and above is considered obese.  Watch levels of cholesterol and blood lipids  You should start having your blood tested for lipids and cholesterol at 66 years of age, then have this test every 5 years.  You may need to have your cholesterol levels checked more often if:  Your lipid or cholesterol levels are high.  You are older than 66 years of age.  You are at high risk for heart disease.  CANCER SCREENING   Lung Cancer  Lung cancer screening is recommended for adults 44-74 years old who are at high risk for lung cancer because of a history of smoking.  A yearly low-dose CT scan of the lungs is recommended for people who:  Currently  smoke.  Have quit within the past 15 years.  Have at least a 30-pack-year history of smoking. A pack year is smoking an average of one pack of cigarettes a day for 1 year.  Yearly screening should continue until it has been 15 years since you quit.  Yearly screening should stop if you develop a health problem that would prevent you from having lung cancer treatment.  Breast Cancer  Practice breast self-awareness. This means understanding how your breasts normally appear and feel.  It also means doing regular breast self-exams. Let your health care provider know about any changes, no matter how small.  If you are in your 20s or 30s, you should have a clinical breast exam (CBE) by a health care provider every 1-3 years as part of a regular health exam.  If you are 68 or older, have a CBE every year. Also consider having a breast X-ray (mammogram) every year.  If you have a family history of breast cancer, talk to your health care provider about genetic screening.  If you are at high risk for breast cancer, talk to your health care provider about having an MRI and a mammogram every year.  Breast cancer gene (BRCA) assessment is recommended for women who have family members with BRCA-related cancers. BRCA-related cancers include:  Breast.  Ovarian.  Tubal.  Peritoneal cancers.  Results of the assessment will determine the need for genetic counseling and BRCA1 and BRCA2 testing. Cervical Cancer Your health care provider may recommend that you be screened regularly for cancer of the pelvic organs (ovaries, uterus, and vagina). This screening involves a pelvic examination, including checking for microscopic changes to the surface of your cervix (Pap test). You may be encouraged to have this screening  done every 3 years, beginning at age 58.  For women ages 61-65, health care providers may recommend pelvic exams and Pap testing every 3 years, or they may recommend the Pap and pelvic  exam, combined with testing for human papilloma virus (HPV), every 5 years. Some types of HPV increase your risk of cervical cancer. Testing for HPV may also be done on women of any age with unclear Pap test results.  Other health care providers may not recommend any screening for nonpregnant women who are considered low risk for pelvic cancer and who do not have symptoms. Ask your health care provider if a screening pelvic exam is right for you.  If you have had past treatment for cervical cancer or a condition that could lead to cancer, you need Pap tests and screening for cancer for at least 20 years after your treatment. If Pap tests have been discontinued, your risk factors (such as having a new sexual partner) need to be reassessed to determine if screening should resume. Some women have medical problems that increase the chance of getting cervical cancer. In these cases, your health care provider may recommend more frequent screening and Pap tests. Colorectal Cancer  This type of cancer can be detected and often prevented.  Routine colorectal cancer screening usually begins at 66 years of age and continues through 66 years of age.  Your health care provider may recommend screening at an earlier age if you have risk factors for colon cancer.  Your health care provider may also recommend using home test kits to check for hidden blood in the stool.  A small camera at the end of a tube can be used to examine your colon directly (sigmoidoscopy or colonoscopy). This is done to check for the earliest forms of colorectal cancer.  Routine screening usually begins at age 28.  Direct examination of the colon should be repeated every 5-10 years through 66 years of age. However, you may need to be screened more often if early forms of precancerous polyps or small growths are found. Skin Cancer  Check your skin from head to toe regularly.  Tell your health care provider about any new moles or  changes in moles, especially if there is a change in a mole's shape or color.  Also tell your health care provider if you have a mole that is larger than the size of a pencil eraser.  Always use sunscreen. Apply sunscreen liberally and repeatedly throughout the day.  Protect yourself by wearing long sleeves, pants, a wide-brimmed hat, and sunglasses whenever you are outside. HEART DISEASE, DIABETES, AND HIGH BLOOD PRESSURE   High blood pressure causes heart disease and increases the risk of stroke. High blood pressure is more likely to develop in:  People who have blood pressure in the high end of the normal range (130-139/85-89 mm Hg).  People who are overweight or obese.  People who are African American.  If you are 57-17 years of age, have your blood pressure checked every 3-5 years. If you are 48 years of age or older, have your blood pressure checked every year. You should have your blood pressure measured twice--once when you are at a hospital or clinic, and once when you are not at a hospital or clinic. Record the average of the two measurements. To check your blood pressure when you are not at a hospital or clinic, you can use:  An automated blood pressure machine at a pharmacy.  A home blood pressure monitor.  If you are between 42 years and 62 years old, ask your health care provider if you should take aspirin to prevent strokes.  Have regular diabetes screenings. This involves taking a blood sample to check your fasting blood sugar level.  If you are at a normal weight and have a low risk for diabetes, have this test once every three years after 66 years of age.  If you are overweight and have a high risk for diabetes, consider being tested at a younger age or more often. PREVENTING INFECTION  Hepatitis B  If you have a higher risk for hepatitis B, you should be screened for this virus. You are considered at high risk for hepatitis B if:  You were born in a country where  hepatitis B is common. Ask your health care provider which countries are considered high risk.  Your parents were born in a high-risk country, and you have not been immunized against hepatitis B (hepatitis B vaccine).  You have HIV or AIDS.  You use needles to inject street drugs.  You live with someone who has hepatitis B.  You have had sex with someone who has hepatitis B.  You get hemodialysis treatment.  You take certain medicines for conditions, including cancer, organ transplantation, and autoimmune conditions. Hepatitis C  Blood testing is recommended for:  Everyone born from [birth year] through 1966.  Anyone with known risk factors for hepatitis C. Sexually transmitted infections (STIs)  You should be screened for sexually transmitted infections (STIs) including gonorrhea and chlamydia if:  You are sexually active and are younger than 66 years of age.  You are older than 66 years of age and your health care provider tells you that you are at risk for this type of infection.  Your sexual activity has changed since you were last screened and you are at an increased risk for chlamydia or gonorrhea. Ask your health care provider if you are at risk.  If you do not have HIV, but are at risk, it may be recommended that you take a prescription medicine daily to prevent HIV infection. This is called pre-exposure prophylaxis (PrEP). You are considered at risk if:  You are sexually active and do not regularly use condoms or know the HIV status of your partner(s).  You take drugs by injection.  You are sexually active with a partner who has HIV. Talk with your health care provider about whether you are at high risk of being infected with HIV. If you choose to begin PrEP, you should first be tested for HIV. You should then be tested every 3 months for as long as you are taking PrEP.  PREGNANCY   If you are premenopausal and you may become pregnant, ask your health care provider about  preconception counseling.  If you may become pregnant, take 400 to 800 micrograms (mcg) of folic acid every day.  If you want to prevent pregnancy, talk to your health care provider about birth control (contraception). OSTEOPOROSIS AND MENOPAUSE   Osteoporosis is a disease in which the bones lose minerals and strength with aging. This can result in serious bone fractures. Your risk for osteoporosis can be identified using a bone density scan.  If you are 55 years of age or older, or if you are at risk for osteoporosis and fractures, ask your health care provider if you should be screened.  Ask your health care provider whether you should take a calcium or vitamin D supplement to lower your risk for  osteoporosis.  Menopause may have certain physical symptoms and risks.  Hormone replacement therapy may reduce some of these symptoms and risks. Talk to your health care provider about whether hormone replacement therapy is right for you.  HOME CARE INSTRUCTIONS   Schedule regular health, dental, and eye exams.  Stay current with your immunizations.   Do not use any tobacco products including cigarettes, chewing tobacco, or electronic cigarettes.  If you are pregnant, do not drink alcohol.  If you are breastfeeding, limit how much and how often you drink alcohol.  Limit alcohol intake to no more than 1 drink per day for nonpregnant women. One drink equals 12 ounces of beer, 5 ounces of wine, or 1 ounces of hard liquor.  Do not use street drugs.  Do not share needles.  Ask your health care provider for help if you need support or information about quitting drugs.  Tell your health care provider if you often feel depressed.  Tell your health care provider if you have ever been abused or do not feel safe at home.   This information is not intended to replace advice given to you by your health care provider. Make sure you discuss any questions you have with your health care  provider.   Document Released: 06/26/2011 Document Revised: 01/01/2015 Document Reviewed: 11/12/2013 Elsevier Interactive Patient Education [employer].

## 2016-07-27 DIAGNOSIS — M542 Cervicalgia: Secondary | ICD-10-CM | POA: Diagnosis not present

## 2016-07-27 DIAGNOSIS — M5417 Radiculopathy, lumbosacral region: Secondary | ICD-10-CM | POA: Diagnosis not present

## 2016-07-27 DIAGNOSIS — M545 Low back pain: Secondary | ICD-10-CM | POA: Diagnosis not present

## 2016-07-27 DIAGNOSIS — M62838 Other muscle spasm: Secondary | ICD-10-CM | POA: Diagnosis not present

## 2016-08-03 ENCOUNTER — Other Ambulatory Visit: Payer: Self-pay | Admitting: *Deleted

## 2016-08-03 MED ORDER — HYDROCHLOROTHIAZIDE 12.5 MG PO CAPS: 12.5000 mg | ORAL_CAPSULE | Freq: Every day | ORAL | 3 refills | Status: DC

## 2016-08-03 MED ORDER — FLUTICASONE PROPIONATE 50 MCG/ACT NA SUSP: 1.0000 | Freq: Two times a day (BID) | NASAL | 3 refills | Status: DC

## 2016-08-03 MED ORDER — METOPROLOL TARTRATE 50 MG PO TABS
25.0000 mg | ORAL_TABLET | Freq: Two times a day (BID) | ORAL | 3 refills | Status: DC
Start: 2016-08-03 — End: 2017-07-13

## 2016-08-08 DIAGNOSIS — D2361 Other benign neoplasm of skin of right upper limb, including shoulder: Secondary | ICD-10-CM | POA: Diagnosis not present

## 2016-08-08 DIAGNOSIS — L814 Other melanin hyperpigmentation: Secondary | ICD-10-CM | POA: Diagnosis not present

## 2016-08-08 DIAGNOSIS — L821 Other seborrheic keratosis: Secondary | ICD-10-CM | POA: Diagnosis not present

## 2016-08-08 DIAGNOSIS — D225 Melanocytic nevi of trunk: Secondary | ICD-10-CM | POA: Diagnosis not present

## 2016-08-08 DIAGNOSIS — D485 Neoplasm of uncertain behavior of skin: Secondary | ICD-10-CM | POA: Diagnosis not present

## 2016-08-24 DIAGNOSIS — H52223 Regular astigmatism, bilateral: Secondary | ICD-10-CM | POA: Diagnosis not present

## 2016-09-19 DIAGNOSIS — M62838 Other muscle spasm: Secondary | ICD-10-CM | POA: Diagnosis not present

## 2016-09-19 DIAGNOSIS — M545 Low back pain: Secondary | ICD-10-CM | POA: Diagnosis not present

## 2016-09-19 DIAGNOSIS — M5417 Radiculopathy, lumbosacral region: Secondary | ICD-10-CM | POA: Diagnosis not present

## 2016-09-19 DIAGNOSIS — M542 Cervicalgia: Secondary | ICD-10-CM | POA: Diagnosis not present

## 2016-10-17 DIAGNOSIS — Z23 Encounter for immunization: Secondary | ICD-10-CM | POA: Diagnosis not present

## 2016-10-30 ENCOUNTER — Telehealth: Payer: Self-pay | Admitting: *Deleted

## 2016-10-30 NOTE — Telephone Encounter (Signed)
Rec'd call from [facility] wanting to verify rx for Metoprolol. Gave Gina metoprolol msg & sig...Chloe Young

## 2016-11-14 ENCOUNTER — Telehealth: Payer: Self-pay | Admitting: Internal Medicine

## 2016-11-14 NOTE — Telephone Encounter (Signed)
Pt has been scheduled with Marya Amsler on 11/22

## 2016-11-14 NOTE — Telephone Encounter (Signed)
[name] [place] [facility]  Patient Name: Chloe Young  DOB: July 07, 1951    Initial Comment She has developed a stomach virus that started on Saturday. She is still having stomach rumbling, diarrhea and she is still not drinking much.    Nurse Assessment  Nurse: Wynetta Emery, RN, Baker Janus Date/Time Eilene Ghazi Time): 11/14/2016 10:26:27 AM  Confirm and document reason for call. If symptomatic, describe symptoms. You must click the next button to save text entered. ---Manuela Schwartz had high fever on Saturday Sunday but has had fever and diarrhea only with abdominal pain rumbling stomach taking immodium  Does the patient have any new or worsening symptoms? ---Yes  Will a triage be completed? ---Yes  Related visit to physician within the last 2 weeks? ---No  Does the PT have any chronic conditions? (i.e. diabetes, asthma, etc.) ---No  Is this a behavioral health or substance abuse call? ---No     Guidelines    Guideline Title Affirmed Question Affirmed Notes  Diarrhea [1] MODERATE diarrhea (e.g., 4-6 times / day more than normal) AND [2] present > 48 hours (2 days)    Final Disposition User   See Physician within [place], RN, Baker Janus    Comments  appt made 11-15-2016 1045am with Terri Piedra c/o diarrhea since Saturday-- explosive at times taking immodium advised her to stop taking (has taken 4 days) no relief.   Referrals  REFERRED TO PCP OFFICE   Disagree/Comply: Comply

## 2016-11-15 ENCOUNTER — Ambulatory Visit: Payer: Self-pay | Admitting: Family

## 2017-01-03 DIAGNOSIS — M542 Cervicalgia: Secondary | ICD-10-CM | POA: Diagnosis not present

## 2017-02-07 ENCOUNTER — Encounter: Payer: Self-pay | Admitting: Podiatry

## 2017-02-07 ENCOUNTER — Ambulatory Visit (INDEPENDENT_AMBULATORY_CARE_PROVIDER_SITE_OTHER): Payer: Medicare Other | Admitting: Podiatry

## 2017-02-07 ENCOUNTER — Ambulatory Visit (INDEPENDENT_AMBULATORY_CARE_PROVIDER_SITE_OTHER): Payer: Medicare Other

## 2017-02-07 VITALS — Resp 16 | Ht 63.0 in | Wt 140.0 lb

## 2017-02-07 DIAGNOSIS — M2042 Other hammer toe(s) (acquired), left foot: Secondary | ICD-10-CM

## 2017-02-07 DIAGNOSIS — L84 Corns and callosities: Secondary | ICD-10-CM

## 2017-02-07 DIAGNOSIS — M779 Enthesopathy, unspecified: Secondary | ICD-10-CM | POA: Diagnosis not present

## 2017-02-07 DIAGNOSIS — L600 Ingrowing nail: Secondary | ICD-10-CM | POA: Diagnosis not present

## 2017-02-07 MED ORDER — TRIAMCINOLONE ACETONIDE 10 MG/ML IJ SUSP
10.0000 mg | Freq: Once | INTRAMUSCULAR | Status: AC
Start: 2017-02-07 — End: 2017-02-07
  Administered 2017-02-07: 10 mg

## 2017-02-07 NOTE — Patient Instructions (Addendum)

## 2017-02-07 NOTE — Progress Notes (Signed)
   Subjective:    Patient ID: Chloe Young, female    DOB: 01-18-1951, 66 y.o.   MRN: [id number]  HPI  Chief Complaint  Patient presents with  . Callouses    Left; Between 1st and 2nd toes x "couple years"   . Nail Problem    BL; Great Toes; Medial Side. Pt states that she see's something hard and dark in the corner of the nails. The areas are painful.       Review of Systems     Objective:   Physical Exam        Assessment & Plan:

## 2017-02-08 NOTE — Progress Notes (Signed)
Subjective:     Patient ID: Chloe Young, female   DOB: [birth year], 66 y.o.   MRN: [id number]  HPI patient presents stating I have had a lot of pain between my big toe second toe left and I have chronic ingrown toenails of my big toes of both feet on the medial side that are painful and I've tried to trim and soak without relief   Review of Systems  All other systems reviewed and are negative.      Objective:   Physical Exam  Constitutional: She is oriented to person, place, and time.  Cardiovascular: Intact distal pulses.   Musculoskeletal: Normal range of motion.  Neurological: She is oriented to person, place, and time.  Skin: Skin is warm.  Nursing note and vitals reviewed.  neurovascular status intact muscle strength adequate range of motion within normal limits with patient found to have inflammatory changes of the interphalangeal joint left big toe with incurvated medial borders hallux bilateral and structural changes between the first and second metatarsal left foot. 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 Patient wants this done and I explained procedure and risk and today I infiltrated the hallux 60 Milligan times like Marcaine mixture bilateral remove the medial borders exposed matrix and applied phenol 3 applications 30 seconds followed by alcohol lavage and sterile dressing. I then did careful interphalangeal joint injection left interphalangeal joint lateral side to reduce inflammatory capsulitis and debrided lesions and padded. Explained that ultimately this may require surgery to straighten the toe  X-ray report was negative for signs of fracture or other bone pathology with structural changes  to the hallux against the second toe consistent with elevation of the intermetatarsal angle of approximately 15 and bone compression

## 2017-03-20 ENCOUNTER — Ambulatory Visit (INDEPENDENT_AMBULATORY_CARE_PROVIDER_SITE_OTHER): Payer: Medicare Other | Admitting: Podiatry

## 2017-03-20 ENCOUNTER — Encounter: Payer: Self-pay | Admitting: Podiatry

## 2017-03-20 DIAGNOSIS — B351 Tinea unguium: Secondary | ICD-10-CM | POA: Diagnosis not present

## 2017-03-20 NOTE — Patient Instructions (Signed)

## 2017-03-21 NOTE — Progress Notes (Signed)
Subjective:     Patient ID: Chloe Young, female   DOB: 1951-10-31, 66 y.o.   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 We will start formula 3 at this time to try to reduce fungal count and explained eventually she may lose the nail

## 2017-04-03 ENCOUNTER — Telehealth: Payer: Self-pay | Admitting: *Deleted

## 2017-04-03 MED ORDER — CEPHALEXIN 500 MG PO CAPS: 500.0000 mg | ORAL_CAPSULE | Freq: Two times a day (BID) | ORAL | 0 refills | Status: DC

## 2017-04-03 NOTE — Telephone Encounter (Signed)
Start her on keflex bid

## 2017-04-03 NOTE — Telephone Encounter (Addendum)
Pt states she was treated a couple of weeks ago for a fungus toenail that was coming off, and Dr. Paulla Dolly stated he may have to prescribe a antibiotic.Dr. Paulla Dolly ordered Keflex 500mg  #14 one capsule bid. Orders to pt with instructions to call if not better and [facility].

## 2017-05-02 DIAGNOSIS — M62838 Other muscle spasm: Secondary | ICD-10-CM | POA: Diagnosis not present

## 2017-05-02 DIAGNOSIS — I10 Essential (primary) hypertension: Secondary | ICD-10-CM | POA: Diagnosis not present

## 2017-05-02 DIAGNOSIS — M545 Low back pain: Secondary | ICD-10-CM | POA: Diagnosis not present

## 2017-05-02 DIAGNOSIS — M542 Cervicalgia: Secondary | ICD-10-CM | POA: Diagnosis not present

## 2017-05-02 DIAGNOSIS — M5417 Radiculopathy, lumbosacral region: Secondary | ICD-10-CM | POA: Diagnosis not present

## 2017-05-14 DIAGNOSIS — M542 Cervicalgia: Secondary | ICD-10-CM | POA: Diagnosis not present

## 2017-06-12 DIAGNOSIS — M545 Low back pain: Secondary | ICD-10-CM | POA: Diagnosis not present

## 2017-06-12 DIAGNOSIS — M5417 Radiculopathy, lumbosacral region: Secondary | ICD-10-CM | POA: Diagnosis not present

## 2017-06-12 DIAGNOSIS — M542 Cervicalgia: Secondary | ICD-10-CM | POA: Diagnosis not present

## 2017-06-12 DIAGNOSIS — M62838 Other muscle spasm: Secondary | ICD-10-CM | POA: Diagnosis not present

## 2017-06-12 DIAGNOSIS — I10 Essential (primary) hypertension: Secondary | ICD-10-CM | POA: Diagnosis not present

## 2017-07-13 ENCOUNTER — Other Ambulatory Visit: Payer: Self-pay | Admitting: Internal Medicine

## 2017-07-30 NOTE — Assessment & Plan Note (Signed)
dexa up to date Taking calcium and vitamin d Stressed regular exercise 

## 2017-07-30 NOTE — Progress Notes (Addendum)
Subjective:    Patient ID: Chloe Young, female    DOB: [birth year], 66 y.o.   MRN: [id number]  HPI Here for subsequent medicare wellness exam an annual physical exam.   I have personally reviewed and have noted 1.The patient's medical and social history 2.Their use of alcohol, tobacco or illicit drugs 3.Their current medications and supplements 4.The patient's functional ability including ADL's, fall risks, home                 safety risk and hearing or visual impairment. 5.Diet and physical activities 6.Evidence for depression or mood disorders 7.Care team reviewed  - podiatry - Dr Paulla Dolly,  Eye - , pain [facility]    Are there smokers in your home (other than you)? No  Risk Factors Exercise: walking dog Dietary issues discussed: well balanced, follows a weight watchers diet  Cardiac risk factors: advanced age, hypertension, hyperlipidemia, and obes  Depression Screen  Have you felt down, depressed or hopeless? No  Have you felt little interest or pleasure in doing things?  No  Activities of Daily Living In your present state of health, do you have any difficulty performing the following activities?:  Driving? No Managing money?  No Feeding yourself? No Getting from bed to chair? No Climbing a flight of stairs? No Preparing food and eating?: No Bathing or showering? No Getting dressed: No Getting to/using the toilet? No Moving around from place to place: No In the past year have you fallen or had a near fall?: No   Do you have more than one partner?  No   Hearing Difficulties: No Do you often ask people to speak up or repeat themselves? No Do you experience ringing or noises in your ears? No Do you have difficulty understanding soft or whispered voices? No Vision:              Any change in vision:  no             Up to date with eye exam: yes  Memory:  Do you  feel that you have a problem with memory? No  Do you often misplace items? No  Do you feel safe at home?  Yes  Cognitive Testing  Alert, Orientated? Yes  Normal Appearance? Yes  Recall of three objects?  Yes  Can perform simple calculations? Yes  Displays appropriate judgment? Yes  Can read the correct time from a watch face? Yes   Advanced Directives have been discussed with the patient? Yes    Medications and allergies reviewed with patient and updated if appropriate.  Patient Active Problem List   Diagnosis Date Noted  . Lumbar radiculopathy 06/15/2016  . Cervicogenic headache 06/15/2016  . Osteopenia 06/15/2016  . GERD (gastroesophageal reflux disease) 06/15/2016  . Fasting hyperglycemia 11/25/2013  . Nonspecific abnormal electrocardiogram (ECG) (EKG) 05/28/2012  . Cervical post-laminectomy syndrome 03/18/2012  . Osteoarthritis of both knees 03/18/2012  . RAYNAUD'S SYNDROME 10/11/2010  . NECK PAIN, CHRONIC 04/19/2010  . Hyperlipidemia 04/19/2009  . Essential hypertension 04/19/2009    Current Outpatient Prescriptions on File Prior to Visit  Medication Sig Dispense Refill  . Calcium Carbonate-Vitamin D (CALCIUM + D PO) Take 1 tablet by mouth daily.     Marland Kitchen estradiol-norethindrone (COMBIPATCH) 0.05-0.25 MG/DAY APPLY ONE PATCH TWICE A WEEK. 24 patch 0  . fluticasone (FLONASE) 50 MCG/ACT nasal spray USE 1 SPRAY IN EACH NOSTRIL TWO TIMES DAILY 48 g 0  . hydrochlorothiazide (MICROZIDE)  12.5 MG capsule TAKE 1 CAPSULE BY MOUTH  DAILY 90 capsule 0  . HYDROcodone-acetaminophen (NORCO) 7.5-325 MG per tablet Take 1 tablet by mouth every 6 (six) hours as needed.    Marland Kitchen LYRICA 150 MG capsule     . metoprolol tartrate (LOPRESSOR) 50 MG tablet TAKE ONE-HALF TABLET BY  MOUTH TWICE A DAY 90 tablet 0  . Multiple Vitamin (MULITIVITAMIN WITH MINERALS) TABS Take 1 tablet by mouth daily.     Marland Kitchen omeprazole (PRILOSEC) 20 MG capsule Take 1 capsule (20 mg total) by mouth daily. 30 capsule 3  .  topiramate (TOPAMAX) 100 MG tablet Take 2 tablets by mouth 2 (two) times daily.     No current facility-administered medications on file prior to visit.     Past Medical History:  Diagnosis Date  . Cervical facet syndrome   . Cervical post-laminectomy syndrome    Dr Tessa Lerner[facility]  . Cervicalgia   . Chronic pain syndrome   . Hyperlipidemia    joint pain with statin  . Hypertension   . Osteoarthritis, localized, knee    OA AND PAIN LEFT KNEE  -- S/P RIGHT  KNEE ARTHROPLASTY ON 82012--KNEE IS DOING WELL.  . Osteopenia 06/2016    T score -1.6 FRAX not calculated    Past Surgical History:  Procedure Laterality Date  . CERVIAL FUSION  2006   Dr Arnoldo Morale, [place]  . COLONOSCOPY  2011    Dr Fuller Plan  . ESI  09/03/13   L 5- S1 ; Dr Nelva Bush  . JOINT REPLACEMENT  AUG 2012    RIGHT KNEE ARTHROPLASTY  . KNEE ARTHROSCOPY     bilaterally; Dr Tonita Cong  . TOTAL KNEE ARTHROPLASTY  01/11/2012   Procedure: TOTAL KNEE ARTHROPLASTY;  Surgeon: Johnn Hai, MD;  Location: [facility];  Service: Orthopedics;  Laterality: Left;  General with Femoral nerve block    Social History   Social History  . Marital status: Married    Spouse name: N/A  . Number of children: 1  . Years of education: N/A   Occupational History  . 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 Smoking status: Never Smoker  . Smokeless tobacco: Never Used  . Alcohol use 8.4 oz/week    14 Shots of liquor per week     Comment: 2 DRINKS PER NIGHT  . Drug use: No  . Sexual activity: Yes    Birth control/ protection: Post-menopausal   Other Topics Concern  . Not on file   Social History Narrative  . No narrative on file    Family History  Problem Relation Age of Onset  . Diverticulitis Mother 6       with fissures.Marland Kitchenopted for no surgery  . Cancer Father 40        malignant brain tumor  . Breast cancer Maternal Aunt   . Diabetes Maternal Grandfather   . Heart failure Maternal Grandfather   . Stroke  Maternal Uncle        in 31s  . Heart disease Maternal Uncle        X4 ; 1 had MI @ 9  . Hypertension Sister     Review of Systems  Constitutional: Negative for appetite change, diaphoresis, fatigue and fever.  HENT: Negative for hearing loss and tinnitus.   Eyes: Negative for visual disturbance.  Respiratory: Negative for cough, shortness of breath and wheezing.   Cardiovascular: Positive for leg swelling (slightly worse, mild). Negative for chest  pain and palpitations.  Gastrointestinal: Negative for abdominal pain, blood in stool, constipation, diarrhea and nausea.       No gerd  Genitourinary: Negative for dysuria and hematuria.  Musculoskeletal: Positive for neck pain. Negative for arthralgias.  Skin: Negative for color change and rash.  Neurological: Positive for headaches (related to neck). Negative for dizziness, weakness, light-headedness and numbness.  Psychiatric/Behavioral: Negative for dysphoric mood. The patient is not nervous/anxious.        Objective:   Vitals:   07/31/17 1405  BP: 112/84  Pulse: 67  Resp: 16  Temp: 98.4 F (36.9 C)   Filed Weights   07/31/17 1405  Weight: 148 lb (67.1 kg)   Body mass index is 26.22 kg/m.  Wt Readings from Last 3 Encounters:  07/31/17 148 lb (67.1 kg)  02/07/17 140 lb (63.5 kg)  07/25/16 141 lb (64 kg)     Physical Exam Constitutional: She appears well-developed and well-nourished. No distress.  HENT:  Head: Normocephalic and atraumatic.  Right Ear: External ear normal. Normal ear canal and TM Left Ear: External ear normal.  Normal ear canal and TM Mouth/Throat: Oropharynx is clear and moist.  Eyes: Conjunctivae and EOM are normal.  Neck: Neck supple. No tracheal deviation present. No thyromegaly present.  No carotid bruit  Cardiovascular: Normal rate, regular rhythm and normal heart sounds.   No murmur heard.  No edema. Pulmonary/Chest: Effort normal and breath sounds normal. No respiratory distress. She has  no wheezes. She has no rales.  Breast: normal b/l breast exam - no lumps, skin changes, nipple d/c or axillary lymphadenopathy Abdominal: Soft. She exhibits no distension. There is no tenderness.  Lymphadenopathy: She has no cervical adenopathy.  Skin: Skin is warm and dry. She is not diaphoretic.  Psychiatric: She has a normal mood and affect. Her behavior is normal.        Assessment & Plan:   Wellness Exam: Immunizations  Pneumovax due, can have shingrix - discussed Colonoscopy   Up to date  Mammogram   Up to date  Gyn - last saw a few years ago Dexa    Up to date  Eye exam  Up to date  Hearing loss  none Memory concerns/difficulties   none Independent of ADLs    fully Stressed the importance of regular exercise   Patient received copy of preventative screening tests/immunizations recommended for the next 5-10 years.   Physical exam: Screening blood work  Ordered  Immunizations  Pneumovax due, can have shingrix - discussed Colonoscopy   Up to date  Mammogram   Up to date  Gyn- last saw a few years ago Dexa    Up to date  Eye exams Up to date  EKG  Last EKG 2013 --  Repeat today - EKG shows - Sinus rhythm at 63 bpm, negative T waves in V1, V2, no significant change compare to prior EKG from 2013 Exercise - walking dog - stressed regular exercise Weight - will work on weight loss - wants to lose 5 lbs - belongs to weight watchers Skin  No concerns Substance abuse   none  See Problem List for Assessment and Plan of chronic medical problems.  FU annually

## 2017-07-30 NOTE — Patient Instructions (Addendum)
Chloe Young , Thank you for taking time to come for your Medicare Wellness Visit. I appreciate your ongoing commitment to your health goals. Please review the following plan we discussed and let me know if I can assist you in the future.   These are the goals we discussed: Goals    .  continue regular exercise     Consider getting the new shingles vaccine     This is a list of the screening recommended for you and due dates:  Health Maintenance  Topic Date Due  . Pneumonia vaccines (2 of 2 - PPSV23) 07/31/2017  . Flu Shot  07/25/2017  . Mammogram  06/26/2018  . DEXA scan (bone density measurement)  06/30/2018  . Tetanus Vaccine  04/20/2019  . Colon Cancer Screening  05/31/2020  .  Hepatitis C: One time screening is recommended by Center for Disease Control  (CDC) for  adults born from [birth year] through 1966.   Completed    Test(s) ordered today. Your results will be released to [place] (or called to you) after review, usually within 72hours after test completion. If any changes need to be made, you will be notified at that same time.  All other Health Maintenance issues reviewed.   All recommended immunizations and age-appropriate screenings are up-to-date or discussed.  Pneumonia immunization administered today.   Medications reviewed and updated.   No changes recommended at this time.   An EKG was done today  Please followup in one year   Health Maintenance, Female Adopting a healthy lifestyle and getting preventive care can go a long way to promote health and wellness. Talk with your health care provider about what schedule of regular examinations is right for you. This is a good chance for you to check in with your provider about disease prevention and staying healthy. In between checkups, there are plenty of things you can do on your own. Experts have done a lot of research about which lifestyle changes and preventive measures are most likely to keep you healthy. Ask your health  care provider for more information. Weight and diet Eat a healthy diet  Be sure to include plenty of vegetables, fruits, low-fat dairy products, and lean protein.  Do not eat a lot of foods high in solid fats, added sugars, or salt.  Get regular exercise. This is one of the most important things you can do for your health. ? Most adults should exercise for at least 150 minutes each week. The exercise should increase your heart rate and make you sweat (moderate-intensity exercise). ? Most adults should also do strengthening exercises at least twice a week. This is in addition to the moderate-intensity exercise.  Maintain a healthy weight  Body mass index (BMI) is a measurement that can be used to identify possible weight problems. It estimates body fat based on height and weight. Your health care provider can help determine your BMI and help you achieve or maintain a healthy weight.  For females 34 years of age and older: ? A BMI below 18.5 is considered underweight. ? A BMI of 18.5 to 24.9 is normal. ? A BMI of 25 to 29.9 is considered overweight. ? A BMI of 30 and above is considered obese.  Watch levels of cholesterol and blood lipids  You should start having your blood tested for lipids and cholesterol at 67 years of age, then have this test every 5 years.  You may need to have your cholesterol levels checked more often if: ?  Your lipid or cholesterol levels are high. ? You are older than 67 years of age. ? You are at high risk for heart disease.  Cancer screening Lung Cancer  Lung cancer screening is recommended for adults 64-88 years old who are at high risk for lung cancer because of a history of smoking.  A yearly low-dose CT scan of the lungs is recommended for people who: ? Currently smoke. ? Have quit within the past 15 years. ? Have at least a 30-pack-year history of smoking. A pack year is smoking an average of one pack of cigarettes a day for 1 year.  Yearly  screening should continue until it has been 15 years since you quit.  Yearly screening should stop if you develop a health problem that would prevent you from having lung cancer treatment.  Breast Cancer  Practice breast self-awareness. This means understanding how your breasts normally appear and feel.  It also means doing regular breast self-exams. Let your health care provider know about any changes, no matter how small.  If you are in your 20s or 30s, you should have a clinical breast exam (CBE) by a health care provider every 1-3 years as part of a regular health exam.  If you are 101 or older, have a CBE every year. Also consider having a breast X-ray (mammogram) every year.  If you have a family history of breast cancer, talk to your health care provider about genetic screening.  If you are at high risk for breast cancer, talk to your health care provider about having an MRI and a mammogram every year.  Breast cancer gene (BRCA) assessment is recommended for women who have family members with BRCA-related cancers. BRCA-related cancers include: ? Breast. ? Ovarian. ? Tubal. ? Peritoneal cancers.  Results of the assessment will determine the need for genetic counseling and BRCA1 and BRCA2 testing.  Cervical Cancer Your health care provider may recommend that you be screened regularly for cancer of the pelvic organs (ovaries, uterus, and vagina). This screening involves a pelvic examination, including checking for microscopic changes to the surface of your cervix (Pap test). You may be encouraged to have this screening done every 3 years, beginning at age 80.  For women ages 66-65, health care providers may recommend pelvic exams and Pap testing every 3 years, or they may recommend the Pap and pelvic exam, combined with testing for human papilloma virus (HPV), every 5 years. Some types of HPV increase your risk of cervical cancer. Testing for HPV may also be done on women of any age  with unclear Pap test results.  Other health care providers may not recommend any screening for nonpregnant women who are considered low risk for pelvic cancer and who do not have symptoms. Ask your health care provider if a screening pelvic exam is right for you.  If you have had past treatment for cervical cancer or a condition that could lead to cancer, you need Pap tests and screening for cancer for at least 20 years after your treatment. If Pap tests have been discontinued, your risk factors (such as having a new sexual partner) need to be reassessed to determine if screening should resume. Some women have medical problems that increase the chance of getting cervical cancer. In these cases, your health care provider may recommend more frequent screening and Pap tests.  Colorectal Cancer  This type of cancer can be detected and often prevented.  Routine colorectal cancer screening usually begins at 67 years  of age and continues through 67 years of age.  Your health care provider may recommend screening at an earlier age if you have risk factors for colon cancer.  Your health care provider may also recommend using home test kits to check for hidden blood in the stool.  A small camera at the end of a tube can be used to examine your colon directly (sigmoidoscopy or colonoscopy). This is done to check for the earliest forms of colorectal cancer.  Routine screening usually begins at age 71.  Direct examination of the colon should be repeated every 5-10 years through 66 years of age. However, you may need to be screened more often if early forms of precancerous polyps or small growths are found.  Skin Cancer  Check your skin from head to toe regularly.  Tell your health care provider about any new moles or changes in moles, especially if there is a change in a mole's shape or color.  Also tell your health care provider if you have a mole that is larger than the size of a pencil  eraser.  Always use sunscreen. Apply sunscreen liberally and repeatedly throughout the day.  Protect yourself by wearing long sleeves, pants, a wide-brimmed hat, and sunglasses whenever you are outside.  Heart disease, diabetes, and high blood pressure  High blood pressure causes heart disease and increases the risk of stroke. High blood pressure is more likely to develop in: ? People who have blood pressure in the high end of the normal range (130-139/85-89 mm Hg). ? People who are overweight or obese. ? People who are African American.  If you are 63-68 years of age, have your blood pressure checked every 3-5 years. If you are 18 years of age or older, have your blood pressure checked every year. You should have your blood pressure measured twice-once when you are at a hospital or clinic, and once when you are not at a hospital or clinic. Record the average of the two measurements. To check your blood pressure when you are not at a hospital or clinic, you can use: ? An automated blood pressure machine at a pharmacy. ? A home blood pressure monitor.  If you are between 7 years and 20 years old, ask your health care provider if you should take aspirin to prevent strokes.  Have regular diabetes screenings. This involves taking a blood sample to check your fasting blood sugar level. ? If you are at a normal weight and have a low risk for diabetes, have this test once every three years after 67 years of age. ? If you are overweight and have a high risk for diabetes, consider being tested at a younger age or more often. Preventing infection Hepatitis B  If you have a higher risk for hepatitis B, you should be screened for this virus. You are considered at high risk for hepatitis B if: ? You were born in a country where hepatitis B is common. Ask your health care provider which countries are considered high risk. ? Your parents were born in a high-risk country, and you have not been immunized  against hepatitis B (hepatitis B vaccine). ? You have HIV or AIDS. ? You use needles to inject street drugs. ? You live with someone who has hepatitis B. ? You have had sex with someone who has hepatitis B. ? You get hemodialysis treatment. ? You take certain medicines for conditions, including cancer, organ transplantation, and autoimmune conditions.  Hepatitis C  Blood  testing is recommended for: ? Everyone born from [birth year] through 1966. ? Anyone with known risk factors for hepatitis C.  Sexually transmitted infections (STIs)  You should be screened for sexually transmitted infections (STIs) including gonorrhea and chlamydia if: ? You are sexually active and are younger than 68 years of age. ? You are older than 68 years of age and your health care provider tells you that you are at risk for this type of infection. ? Your sexual activity has changed since you were last screened and you are at an increased risk for chlamydia or gonorrhea. Ask your health care provider if you are at risk.  If you do not have HIV, but are at risk, it may be recommended that you take a prescription medicine daily to prevent HIV infection. This is called pre-exposure prophylaxis (PrEP). You are considered at risk if: ? You are sexually active and do not regularly use condoms or know the HIV status of your partner(s). ? You take drugs by injection. ? You are sexually active with a partner who has HIV.  Talk with your health care provider about whether you are at high risk of being infected with HIV. If you choose to begin PrEP, you should first be tested for HIV. You should then be tested every 3 months for as long as you are taking PrEP. Pregnancy  If you are premenopausal and you may become pregnant, ask your health care provider about preconception counseling.  If you may become pregnant, take 400 to 800 micrograms (mcg) of folic acid every day.  If you want to prevent pregnancy, talk to your health  care provider about birth control (contraception). Osteoporosis and menopause  Osteoporosis is a disease in which the bones lose minerals and strength with aging. This can result in serious bone fractures. Your risk for osteoporosis can be identified using a bone density scan.  If you are 56 years of age or older, or if you are at risk for osteoporosis and fractures, ask your health care provider if you should be screened.  Ask your health care provider whether you should take a calcium or vitamin D supplement to lower your risk for osteoporosis.  Menopause may have certain physical symptoms and risks.  Hormone replacement therapy may reduce some of these symptoms and risks. Talk to your health care provider about whether hormone replacement therapy is right for you. Follow these instructions at home:  Schedule regular health, dental, and eye exams.  Stay current with your immunizations.  Do not use any tobacco products including cigarettes, chewing tobacco, or electronic cigarettes.  If you are pregnant, do not drink alcohol.  If you are breastfeeding, limit how much and how often you drink alcohol.  Limit alcohol intake to no more than 1 drink per day for nonpregnant women. One drink equals 12 ounces of beer, 5 ounces of wine, or 1 ounces of hard liquor.  Do not use street drugs.  Do not share needles.  Ask your health care provider for help if you need support or information about quitting drugs.  Tell your health care provider if you often feel depressed.  Tell your health care provider if you have ever been abused or do not feel safe at home. This information is not intended to replace advice given to you by your health care provider. Make sure you discuss any questions you have with your health care provider. Document Released: 06/26/2011 Document Revised: 05/18/2016 Document Reviewed: 09/14/2015 Elsevier Interactive Patient Education  2018 [place].

## 2017-07-31 ENCOUNTER — Encounter: Payer: Medicare Other | Admitting: Internal Medicine

## 2017-07-31 ENCOUNTER — Ambulatory Visit (INDEPENDENT_AMBULATORY_CARE_PROVIDER_SITE_OTHER): Payer: Medicare Other | Admitting: Internal Medicine

## 2017-07-31 ENCOUNTER — Encounter: Payer: Self-pay | Admitting: Internal Medicine

## 2017-07-31 ENCOUNTER — Other Ambulatory Visit (INDEPENDENT_AMBULATORY_CARE_PROVIDER_SITE_OTHER): Payer: Medicare Other

## 2017-07-31 VITALS — BP 112/84 | HR 67 | Temp 98.4°F | Resp 16 | Ht 63.0 in | Wt 148.0 lb

## 2017-07-31 DIAGNOSIS — M542 Cervicalgia: Secondary | ICD-10-CM | POA: Diagnosis not present

## 2017-07-31 DIAGNOSIS — Z0000 Encounter for general adult medical examination without abnormal findings: Secondary | ICD-10-CM | POA: Diagnosis not present

## 2017-07-31 DIAGNOSIS — G4486 Cervicogenic headache: Secondary | ICD-10-CM

## 2017-07-31 DIAGNOSIS — M85851 Other specified disorders of bone density and structure, right thigh: Secondary | ICD-10-CM

## 2017-07-31 DIAGNOSIS — Z1211 Encounter for screening for malignant neoplasm of colon: Secondary | ICD-10-CM

## 2017-07-31 DIAGNOSIS — M85852 Other specified disorders of bone density and structure, left thigh: Secondary | ICD-10-CM

## 2017-07-31 DIAGNOSIS — E7800 Pure hypercholesterolemia, unspecified: Secondary | ICD-10-CM | POA: Diagnosis not present

## 2017-07-31 DIAGNOSIS — K219 Gastro-esophageal reflux disease without esophagitis: Secondary | ICD-10-CM

## 2017-07-31 DIAGNOSIS — I10 Essential (primary) hypertension: Secondary | ICD-10-CM | POA: Diagnosis not present

## 2017-07-31 DIAGNOSIS — Z23 Encounter for immunization: Secondary | ICD-10-CM | POA: Diagnosis not present

## 2017-07-31 DIAGNOSIS — R51 Headache: Secondary | ICD-10-CM

## 2017-07-31 DIAGNOSIS — R7301 Impaired fasting glucose: Secondary | ICD-10-CM

## 2017-07-31 LAB — LIPID PANEL
CHOL/HDL RATIO: 3
Cholesterol: 248 mg/dL — ABNORMAL HIGH (ref 0–200)
HDL: 75.9 mg/dL (ref >39.00)
LDL Cholesterol: 155 mg/dL — ABNORMAL HIGH (ref 0–99)
NonHDL: 172.52
TRIGLYCERIDES: 86 mg/dL (ref 0.0–149.0)
VLDL: 17.2 mg/dL (ref 0.0–40.0)

## 2017-07-31 LAB — COMPREHENSIVE METABOLIC PANEL
ALBUMIN: 4.2 g/dL (ref 3.5–5.2)
ALT: 22 U/L (ref 0–35)
AST: 24 U/L (ref 0–37)
Alkaline Phosphatase: 85 U/L (ref 39–117)
BILIRUBIN TOTAL: 0.5 mg/dL (ref 0.2–1.2)
BUN: 14 mg/dL (ref 6–23)
CALCIUM: 9.6 mg/dL (ref 8.4–10.5)
CO2: 29 mEq/L (ref 19–32)
Chloride: 100 mEq/L (ref 96–112)
Creatinine, Ser: 0.73 mg/dL (ref 0.40–1.20)
GFR: 84.46 mL/min (ref >60.00)
GLUCOSE: 85 mg/dL (ref 70–99)
Potassium: 3.4 mEq/L — ABNORMAL LOW (ref 3.5–5.1)
Sodium: 135 mEq/L (ref 135–145)
TOTAL PROTEIN: 7.3 g/dL (ref 6.0–8.3)

## 2017-07-31 LAB — CBC WITH DIFFERENTIAL/PLATELET
BASOS ABS: 0 10*3/uL (ref 0.0–0.1)
Basophils Relative: 0.2 % (ref 0.0–3.0)
EOS PCT: 1.8 % (ref 0.0–5.0)
Eosinophils Absolute: 0.1 10*3/uL (ref 0.0–0.7)
HEMATOCRIT: 39.6 % (ref 36.0–46.0)
Hemoglobin: 13.1 g/dL (ref 12.0–15.0)
LYMPHS PCT: 38.3 % (ref 12.0–46.0)
Lymphs Abs: 2 10*3/uL (ref 0.7–4.0)
MCHC: 33.2 g/dL (ref 30.0–36.0)
MCV: 95.2 fl (ref 78.0–100.0)
MONOS PCT: 8.4 % (ref 3.0–12.0)
Monocytes Absolute: 0.4 10*3/uL (ref 0.1–1.0)
NEUTROS PCT: 51.3 % (ref 43.0–77.0)
Neutro Abs: 2.7 10*3/uL (ref 1.4–7.7)
Platelets: 236 10*3/uL (ref 150.0–400.0)
RBC: 4.16 Mil/uL (ref 3.87–5.11)
RDW: 13.5 % (ref 11.5–15.5)
WBC: 5.2 10*3/uL (ref 4.0–10.5)

## 2017-07-31 LAB — HEMOGLOBIN A1C: HEMOGLOBIN A1C: 5.6 % (ref 4.6–6.5)

## 2017-07-31 LAB — TSH: TSH: 2.04 u[IU]/mL (ref 0.35–4.50)

## 2017-07-31 NOTE — Assessment & Plan Note (Signed)
Having intermittent injection Taking norco prn Management per pain management

## 2017-07-31 NOTE — Assessment & Plan Note (Signed)
BP well controlled Current regimen effective and well tolerated Continue current medications at current doses  

## 2017-07-31 NOTE — Assessment & Plan Note (Signed)
a1c

## 2017-07-31 NOTE — Assessment & Plan Note (Signed)
Check lipid panel  Regular exercise and healthy diet encouraged  

## 2017-07-31 NOTE — Assessment & Plan Note (Signed)
GERD controlled Continue daily medication  

## 2017-07-31 NOTE — Assessment & Plan Note (Addendum)
Having intermittent injection Taking norco prn Taking topamax Management per pain management

## 2017-08-06 ENCOUNTER — Other Ambulatory Visit: Payer: Self-pay | Admitting: Internal Medicine

## 2017-08-06 DIAGNOSIS — E7800 Pure hypercholesterolemia, unspecified: Secondary | ICD-10-CM

## 2017-08-06 MED ORDER — ROSUVASTATIN CALCIUM 5 MG PO TABS: 5.0000 mg | ORAL_TABLET | Freq: Every day | ORAL | 3 refills | Status: DC

## 2017-08-08 DIAGNOSIS — Z1231 Encounter for screening mammogram for malignant neoplasm of breast: Secondary | ICD-10-CM | POA: Diagnosis not present

## 2017-08-08 LAB — HM MAMMOGRAPHY

## 2017-08-10 ENCOUNTER — Encounter: Payer: Self-pay | Admitting: Internal Medicine

## 2017-08-10 NOTE — Progress Notes (Signed)
Abstracted and sent to scan  

## 2017-08-16 DIAGNOSIS — M542 Cervicalgia: Secondary | ICD-10-CM | POA: Diagnosis not present

## 2017-08-24 ENCOUNTER — Ambulatory Visit: Payer: Medicare Other

## 2017-09-13 DIAGNOSIS — M5417 Radiculopathy, lumbosacral region: Secondary | ICD-10-CM | POA: Diagnosis not present

## 2017-09-13 DIAGNOSIS — M545 Low back pain: Secondary | ICD-10-CM | POA: Diagnosis not present

## 2017-09-13 DIAGNOSIS — M542 Cervicalgia: Secondary | ICD-10-CM | POA: Diagnosis not present

## 2017-09-13 DIAGNOSIS — I10 Essential (primary) hypertension: Secondary | ICD-10-CM | POA: Diagnosis not present

## 2017-09-16 ENCOUNTER — Other Ambulatory Visit: Payer: Self-pay | Admitting: Internal Medicine

## 2017-09-19 ENCOUNTER — Other Ambulatory Visit: Payer: Self-pay | Admitting: Internal Medicine

## 2017-09-26 ENCOUNTER — Other Ambulatory Visit (INDEPENDENT_AMBULATORY_CARE_PROVIDER_SITE_OTHER): Payer: Medicare Other

## 2017-09-26 DIAGNOSIS — E7800 Pure hypercholesterolemia, unspecified: Secondary | ICD-10-CM

## 2017-09-26 LAB — COMPREHENSIVE METABOLIC PANEL
ALBUMIN: 4.2 g/dL (ref 3.5–5.2)
ALK PHOS: 94 U/L (ref 39–117)
ALT: 27 U/L (ref 0–35)
AST: 27 U/L (ref 0–37)
BILIRUBIN TOTAL: 0.5 mg/dL (ref 0.2–1.2)
BUN: 10 mg/dL (ref 6–23)
CO2: 28 mEq/L (ref 19–32)
CREATININE: 0.75 mg/dL (ref 0.40–1.20)
Calcium: 9.6 mg/dL (ref 8.4–10.5)
Chloride: 103 mEq/L (ref 96–112)
GFR: 81.83 mL/min (ref >60.00)
GLUCOSE: 93 mg/dL (ref 70–99)
Potassium: 3.8 mEq/L (ref 3.5–5.1)
SODIUM: 138 meq/L (ref 135–145)
TOTAL PROTEIN: 7 g/dL (ref 6.0–8.3)

## 2017-09-26 LAB — LIPID PANEL
CHOLESTEROL: 181 mg/dL (ref 0–200)
HDL: 66.7 mg/dL (ref >39.00)
LDL Cholesterol: 96 mg/dL (ref 0–99)
NONHDL: 114.67
Total CHOL/HDL Ratio: 3
Triglycerides: 91 mg/dL (ref 0.0–149.0)
VLDL: 18.2 mg/dL (ref 0.0–40.0)

## 2017-10-19 DIAGNOSIS — Z23 Encounter for immunization: Secondary | ICD-10-CM | POA: Diagnosis not present

## 2017-11-16 ENCOUNTER — Encounter: Payer: Self-pay | Admitting: Family Medicine

## 2017-11-16 ENCOUNTER — Ambulatory Visit: Payer: Medicare Other | Admitting: Family Medicine

## 2017-11-16 VITALS — BP 136/78 | HR 70 | Temp 99.2°F | Ht 63.0 in | Wt 149.0 lb

## 2017-11-16 DIAGNOSIS — L309 Dermatitis, unspecified: Secondary | ICD-10-CM

## 2017-11-16 MED ORDER — TRIAMCINOLONE ACETONIDE 0.5 % EX OINT: 1.0000 "application " | TOPICAL_OINTMENT | Freq: Two times a day (BID) | CUTANEOUS | 0 refills | Status: DC

## 2017-11-16 NOTE — Progress Notes (Signed)
Pre visit review using our clinic review tool, if applicable. No additional management support is needed unless otherwise documented below in the visit note. 

## 2017-11-16 NOTE — Progress Notes (Signed)
Chief Complaint  Patient presents with  . Rash    Chloe Young is a 66 y.o. female here for a skin complaint.  Duration: 1 day Location: upper chest, R arm Pruritic?Yes Painful? No Drainage? No New soaps/lotions/topicals/detergents? No- she did have on some kinesio tape that could have caused it, not in exact distribution and not on arm Sick contacts? No Other associated symptoms: none Therapies tried thus far: none  ROS:  Const: No fevers Skin: As noted in HPI  Past Medical History:  Diagnosis Date  . Cervical facet syndrome   . Cervical post-laminectomy syndrome    Dr Tessa [facility]  . Cervicalgia   . Chronic pain syndrome   . Hyperlipidemia    joint pain with statin  . Hypertension   . Osteoarthritis, localized, knee    OA AND PAIN LEFT KNEE  -- S/P RIGHT  KNEE ARTHROPLASTY ON 82012--KNEE IS DOING WELL.  . Osteopenia 06/2016    T score -1.6 FRAX not calculated   No Known Allergies Allergies as of 11/16/2017   No Known Allergies     Medication List        Accurate as of 11/16/17 12:51 PM. Always use your most recent med list.          CALCIUM + D PO Take 1 tablet by mouth daily.   fluticasone 50 MCG/ACT nasal spray Commonly known as:  FLONASE USE 1 SPRAY IN EACH NOSTRIL TWO TIMES DAILY   hydrochlorothiazide 12.5 MG capsule Commonly known as:  MICROZIDE TAKE 1 CAPSULE BY MOUTH  DAILY   HYDROcodone-acetaminophen 7.5-325 MG tablet Commonly known as:  NORCO Take 1 tablet by mouth every 6 (six) hours as needed.   LYRICA 150 MG capsule Generic drug:  pregabalin   metoprolol tartrate 50 MG tablet Commonly known as:  LOPRESSOR TAKE ONE-HALF TABLET BY  MOUTH TWICE A DAY   multivitamin with minerals Tabs tablet Take 1 tablet by mouth daily.   omeprazole 20 MG capsule Commonly known as:  PRILOSEC Take 1 capsule (20 mg total) by mouth daily.   rosuvastatin 5 MG tablet Commonly known as:  CRESTOR Take 1 tablet (5 mg total) by mouth daily.   topiramate 100 MG tablet Commonly known as:  TOPAMAX Take 2 tablets by mouth 2 (two) times daily.   triamcinolone ointment 0.5 % Commonly known as:  KENALOG Apply 1 application topically 2 (two) times daily.       BP 136/78   Pulse 70   Temp 99.2 F (37.3 C) (Oral)   Ht 5\' 3"  (1.6 m)   Wt 149 lb (67.6 kg)   SpO2 98%   BMI 26.39 kg/m  Gen: awake, alert, appearing stated age Lungs: No accessory muscle use Skin: The patient was examined in presence of female chaperone. See below. It does blanch with pressure. No drainage, erythema, TTP, fluctuance, excoriation Psych: Age appropriate judgment and insight  Media Information   Media Information     Dermatitis  Kenalog BID, PO antihistamine, BID lotion, avoid scented products. Warning s/s's discussed. F/u prn. The patient voiced understanding and agreement to the plan.  Clay, DO 11/16/17 12:51 PM

## 2017-11-16 NOTE — Patient Instructions (Signed)
Apply lotions twice daily to keep in moisture as this can help with itching. Cold may also be helpful.  Consider taking an oral antihistamine to help as well (Zyretc, Xyzal, Allegra, and Claritin).  If you start having fevers, drainage, worsening symptoms, seek care.

## 2017-11-17 ENCOUNTER — Other Ambulatory Visit: Payer: Self-pay | Admitting: Internal Medicine

## 2017-11-19 DIAGNOSIS — M542 Cervicalgia: Secondary | ICD-10-CM | POA: Diagnosis not present

## 2018-01-09 DIAGNOSIS — M542 Cervicalgia: Secondary | ICD-10-CM | POA: Diagnosis not present

## 2018-01-09 DIAGNOSIS — G8929 Other chronic pain: Secondary | ICD-10-CM | POA: Diagnosis not present

## 2018-01-09 DIAGNOSIS — M545 Low back pain: Secondary | ICD-10-CM | POA: Diagnosis not present

## 2018-01-16 DIAGNOSIS — M545 Low back pain: Secondary | ICD-10-CM | POA: Diagnosis not present

## 2018-01-16 DIAGNOSIS — M25659 Stiffness of unspecified hip, not elsewhere classified: Secondary | ICD-10-CM | POA: Diagnosis not present

## 2018-01-16 DIAGNOSIS — M6281 Muscle weakness (generalized): Secondary | ICD-10-CM | POA: Diagnosis not present

## 2018-01-16 DIAGNOSIS — M2560 Stiffness of unspecified joint, not elsewhere classified: Secondary | ICD-10-CM | POA: Diagnosis not present

## 2018-01-18 DIAGNOSIS — M25659 Stiffness of unspecified hip, not elsewhere classified: Secondary | ICD-10-CM | POA: Diagnosis not present

## 2018-01-18 DIAGNOSIS — M6281 Muscle weakness (generalized): Secondary | ICD-10-CM | POA: Diagnosis not present

## 2018-01-18 DIAGNOSIS — M2560 Stiffness of unspecified joint, not elsewhere classified: Secondary | ICD-10-CM | POA: Diagnosis not present

## 2018-01-18 DIAGNOSIS — M545 Low back pain: Secondary | ICD-10-CM | POA: Diagnosis not present

## 2018-01-21 DIAGNOSIS — M6281 Muscle weakness (generalized): Secondary | ICD-10-CM | POA: Diagnosis not present

## 2018-01-21 DIAGNOSIS — M2560 Stiffness of unspecified joint, not elsewhere classified: Secondary | ICD-10-CM | POA: Diagnosis not present

## 2018-01-21 DIAGNOSIS — M25659 Stiffness of unspecified hip, not elsewhere classified: Secondary | ICD-10-CM | POA: Diagnosis not present

## 2018-01-21 DIAGNOSIS — M545 Low back pain: Secondary | ICD-10-CM | POA: Diagnosis not present

## 2018-01-23 DIAGNOSIS — M545 Low back pain: Secondary | ICD-10-CM | POA: Diagnosis not present

## 2018-01-23 DIAGNOSIS — M6281 Muscle weakness (generalized): Secondary | ICD-10-CM | POA: Diagnosis not present

## 2018-01-23 DIAGNOSIS — M2560 Stiffness of unspecified joint, not elsewhere classified: Secondary | ICD-10-CM | POA: Diagnosis not present

## 2018-01-23 DIAGNOSIS — M25659 Stiffness of unspecified hip, not elsewhere classified: Secondary | ICD-10-CM | POA: Diagnosis not present

## 2018-01-25 DIAGNOSIS — M6281 Muscle weakness (generalized): Secondary | ICD-10-CM | POA: Diagnosis not present

## 2018-01-25 DIAGNOSIS — M2560 Stiffness of unspecified joint, not elsewhere classified: Secondary | ICD-10-CM | POA: Diagnosis not present

## 2018-01-25 DIAGNOSIS — M545 Low back pain: Secondary | ICD-10-CM | POA: Diagnosis not present

## 2018-01-25 DIAGNOSIS — M25659 Stiffness of unspecified hip, not elsewhere classified: Secondary | ICD-10-CM | POA: Diagnosis not present

## 2018-01-28 DIAGNOSIS — M6281 Muscle weakness (generalized): Secondary | ICD-10-CM | POA: Diagnosis not present

## 2018-01-28 DIAGNOSIS — M545 Low back pain: Secondary | ICD-10-CM | POA: Diagnosis not present

## 2018-01-28 DIAGNOSIS — M25659 Stiffness of unspecified hip, not elsewhere classified: Secondary | ICD-10-CM | POA: Diagnosis not present

## 2018-01-28 DIAGNOSIS — M2560 Stiffness of unspecified joint, not elsewhere classified: Secondary | ICD-10-CM | POA: Diagnosis not present

## 2018-01-30 DIAGNOSIS — G8929 Other chronic pain: Secondary | ICD-10-CM | POA: Diagnosis not present

## 2018-01-30 DIAGNOSIS — M542 Cervicalgia: Secondary | ICD-10-CM | POA: Diagnosis not present

## 2018-02-01 DIAGNOSIS — M545 Low back pain: Secondary | ICD-10-CM | POA: Diagnosis not present

## 2018-02-01 DIAGNOSIS — M25659 Stiffness of unspecified hip, not elsewhere classified: Secondary | ICD-10-CM | POA: Diagnosis not present

## 2018-02-01 DIAGNOSIS — M2560 Stiffness of unspecified joint, not elsewhere classified: Secondary | ICD-10-CM | POA: Diagnosis not present

## 2018-02-01 DIAGNOSIS — M6281 Muscle weakness (generalized): Secondary | ICD-10-CM | POA: Diagnosis not present

## 2018-02-08 ENCOUNTER — Other Ambulatory Visit: Payer: Self-pay | Admitting: Internal Medicine

## 2018-02-20 DIAGNOSIS — D2362 Other benign neoplasm of skin of left upper limb, including shoulder: Secondary | ICD-10-CM | POA: Diagnosis not present

## 2018-02-20 DIAGNOSIS — L814 Other melanin hyperpigmentation: Secondary | ICD-10-CM | POA: Diagnosis not present

## 2018-02-20 DIAGNOSIS — L820 Inflamed seborrheic keratosis: Secondary | ICD-10-CM | POA: Diagnosis not present

## 2018-02-20 DIAGNOSIS — L821 Other seborrheic keratosis: Secondary | ICD-10-CM | POA: Diagnosis not present

## 2018-02-20 DIAGNOSIS — D225 Melanocytic nevi of trunk: Secondary | ICD-10-CM | POA: Diagnosis not present

## 2018-02-21 DIAGNOSIS — M542 Cervicalgia: Secondary | ICD-10-CM | POA: Diagnosis not present

## 2018-03-07 ENCOUNTER — Other Ambulatory Visit: Payer: Self-pay | Admitting: Family Medicine

## 2018-03-21 DIAGNOSIS — G8929 Other chronic pain: Secondary | ICD-10-CM | POA: Diagnosis not present

## 2018-03-21 DIAGNOSIS — M542 Cervicalgia: Secondary | ICD-10-CM | POA: Diagnosis not present

## 2018-03-21 DIAGNOSIS — M545 Low back pain: Secondary | ICD-10-CM | POA: Diagnosis not present

## 2018-04-18 ENCOUNTER — Other Ambulatory Visit: Payer: Self-pay | Admitting: Internal Medicine

## 2018-04-18 NOTE — Telephone Encounter (Signed)
Copied from [place] [id number]. Topic: Quick Communication - Rx Refill/Question >> Apr 18, 2018  9:19 AM Cleaster Corin, NT wrote: Medication:omeprazole ([place]) 20 MG capsule [[id number]]  Has the patient contacted their pharmacy? yes (Agent: If no, request that the patient contact the pharmacy for the refill.) Preferred Pharmacy (with phone number or street name): [place] [address] [place] [address] [place] [address] Phone: [phone] Fax: [phone]   Agent: Please be advised that RX refills may take up to 3 business days. We ask that you follow-up with your pharmacy.

## 2018-04-19 MED ORDER — OMEPRAZOLE 20 MG PO CPDR: 20.0000 mg | DELAYED_RELEASE_CAPSULE | Freq: Every day | ORAL | 0 refills | Status: DC

## 2018-04-19 NOTE — Telephone Encounter (Signed)
omeprazole refill Last OV: 06/15/16 Last Refill:06/15/16 #30 3 RF Pharmacy:[facility] [id number] [place]

## 2018-04-19 NOTE — Telephone Encounter (Signed)
Sent 30 day script to local pharmacy. Pt is overdue for follow-up appt.Marland KitchenJohny Young

## 2018-05-09 ENCOUNTER — Telehealth: Payer: Self-pay | Admitting: Internal Medicine

## 2018-05-09 ENCOUNTER — Other Ambulatory Visit: Payer: Self-pay | Admitting: Emergency Medicine

## 2018-05-09 MED ORDER — ROSUVASTATIN CALCIUM 5 MG PO TABS: 5.0000 mg | ORAL_TABLET | Freq: Every day | ORAL | 2 refills | Status: DC

## 2018-05-09 NOTE — Telephone Encounter (Signed)
Copied from [place] [id number]. Topic: Quick Communication - See Telephone Encounter >> May 09, 2018  9:56 AM Ether Griffins B wrote: CRM for notification. See Telephone encounter for: 05/09/18.  Pt would like to to know if she needs to do a follow up apt regarding the rosuvastatin (CRESTOR) 5 MG tablet or if it can just be refilled. If refill is called in please send to [place], [address].

## 2018-05-09 NOTE — Telephone Encounter (Signed)
Spoke with pt to inform. RX sent to [facility] until appt in Aug.

## 2018-05-23 DIAGNOSIS — M542 Cervicalgia: Secondary | ICD-10-CM | POA: Diagnosis not present

## 2018-07-02 ENCOUNTER — Other Ambulatory Visit: Payer: Self-pay | Admitting: Internal Medicine

## 2018-07-02 DIAGNOSIS — M542 Cervicalgia: Secondary | ICD-10-CM | POA: Diagnosis not present

## 2018-07-02 DIAGNOSIS — M545 Low back pain: Secondary | ICD-10-CM | POA: Diagnosis not present

## 2018-07-02 DIAGNOSIS — G8929 Other chronic pain: Secondary | ICD-10-CM | POA: Diagnosis not present

## 2018-07-04 DIAGNOSIS — M8589 Other specified disorders of bone density and structure, multiple sites: Secondary | ICD-10-CM | POA: Diagnosis not present

## 2018-07-08 ENCOUNTER — Telehealth: Payer: Self-pay | Admitting: Internal Medicine

## 2018-07-08 NOTE — Telephone Encounter (Signed)
Let her know her bone density scan showed that she still has osteopenia, but there has been a statistically significant decrease in bone mineral density in her spine and right hip.  Her left hip is stable.  She is not at the point that she needs treatment, but needs to do more with lifestyle to prevent further decline in her bone density to avoid medication in the future.  Should continue calcium and vitamin D daily Should be exercising on a regular basis. Follow-up bone density in 2 years

## 2018-07-09 NOTE — Telephone Encounter (Signed)
Spoke with pt to inform.  

## 2018-07-29 ENCOUNTER — Other Ambulatory Visit: Payer: Self-pay | Admitting: Internal Medicine

## 2018-08-07 NOTE — Patient Instructions (Addendum)
Take calcium 600 mg twice a day.  Take about 2000 units of vitamin D a day.     Test(s) ordered today. Your results will be released to [place] (or called to you) after review, usually within 72hours after test completion. If any changes need to be made, you will be notified at that same time.  All other Health Maintenance issues reviewed.   All recommended immunizations and age-appropriate screenings are up-to-date or discussed.  No immunizations administered today.   Consider getting the shingles vaccine.   Medications reviewed and updated.  Changes include -   Trying zantac for your heartburn and trying to taper off the omeprazole.  Calcium citrate is better absorbed than calcium carbonate and better with the omeprazole.  Start fosamax weekly to help improve your bone density.    Your prescription(s) have been submitted to your pharmacy. Please take as directed and contact our office if you believe you are having problem(s) with the medication(s).   Please followup in 6 months   Health Maintenance, Female Adopting a healthy lifestyle and getting preventive care can go a long way to promote health and wellness. Talk with your health care provider about what schedule of regular examinations is right for you. This is a good chance for you to check in with your provider about disease prevention and staying healthy. In between checkups, there are plenty of things you can do on your own. Experts have done a lot of research about which lifestyle changes and preventive measures are most likely to keep you healthy. Ask your health care provider for more information. Weight and diet Eat a healthy diet  Be sure to include plenty of vegetables, fruits, low-fat dairy products, and lean protein.  Do not eat a lot of foods high in solid fats, added sugars, or salt.  Get regular exercise. This is one of the most important things you can do for your health. ? Most adults should exercise for at  least 150 minutes each week. The exercise should increase your heart rate and make you sweat (moderate-intensity exercise). ? Most adults should also do strengthening exercises at least twice a week. This is in addition to the moderate-intensity exercise.  Maintain a healthy weight  Body mass index (BMI) is a measurement that can be used to identify possible weight problems. It estimates body fat based on height and weight. Your health care provider can help determine your BMI and help you achieve or maintain a healthy weight.  For females 58 years of age and older: ? A BMI below 18.5 is considered underweight. ? A BMI of 18.5 to 24.9 is normal. ? A BMI of 25 to 29.9 is considered overweight. ? A BMI of 30 and above is considered obese.  Watch levels of cholesterol and blood lipids  You should start having your blood tested for lipids and cholesterol at 68 years of age, then have this test every 5 years.  You may need to have your cholesterol levels checked more often if: ? Your lipid or cholesterol levels are high. ? You are older than 68 years of age. ? You are at high risk for heart disease.  Cancer screening Lung Cancer  Lung cancer screening is recommended for adults 20-53 years old who are at high risk for lung cancer because of a history of smoking.  A yearly low-dose CT scan of the lungs is recommended for people who: ? Currently smoke. ? Have quit within the past 15 years. ? Have  at least a 30-pack-year history of smoking. A pack year is smoking an average of one pack of cigarettes a day for 1 year.  Yearly screening should continue until it has been 15 years since you quit.  Yearly screening should stop if you develop a health problem that would prevent you from having lung cancer treatment.  Breast Cancer  Practice breast self-awareness. This means understanding how your breasts normally appear and feel.  It also means doing regular breast self-exams. Let your  health care provider know about any changes, no matter how small.  If you are in your 20s or 30s, you should have a clinical breast exam (CBE) by a health care provider every 1-3 years as part of a regular health exam.  If you are 49 or older, have a CBE every year. Also consider having a breast X-ray (mammogram) every year.  If you have a family history of breast cancer, talk to your health care provider about genetic screening.  If you are at high risk for breast cancer, talk to your health care provider about having an MRI and a mammogram every year.  Breast cancer gene (BRCA) assessment is recommended for women who have family members with BRCA-related cancers. BRCA-related cancers include: ? Breast. ? Ovarian. ? Tubal. ? Peritoneal cancers.  Results of the assessment will determine the need for genetic counseling and BRCA1 and BRCA2 testing.  Cervical Cancer Your health care provider may recommend that you be screened regularly for cancer of the pelvic organs (ovaries, uterus, and vagina). This screening involves a pelvic examination, including checking for microscopic changes to the surface of your cervix (Pap test). You may be encouraged to have this screening done every 3 years, beginning at age 28.  For women ages 69-65, health care providers may recommend pelvic exams and Pap testing every 3 years, or they may recommend the Pap and pelvic exam, combined with testing for human papilloma virus (HPV), every 5 years. Some types of HPV increase your risk of cervical cancer. Testing for HPV may also be done on women of any age with unclear Pap test results.  Other health care providers may not recommend any screening for nonpregnant women who are considered low risk for pelvic cancer and who do not have symptoms. Ask your health care provider if a screening pelvic exam is right for you.  If you have had past treatment for cervical cancer or a condition that could lead to cancer, you need  Pap tests and screening for cancer for at least 20 years after your treatment. If Pap tests have been discontinued, your risk factors (such as having a new sexual partner) need to be reassessed to determine if screening should resume. Some women have medical problems that increase the chance of getting cervical cancer. In these cases, your health care provider may recommend more frequent screening and Pap tests.  Colorectal Cancer  This type of cancer can be detected and often prevented.  Routine colorectal cancer screening usually begins at 68 years of age and continues through 68 years of age.  Your health care provider may recommend screening at an earlier age if you have risk factors for colon cancer.  Your health care provider may also recommend using home test kits to check for hidden blood in the stool.  A small camera at the end of a tube can be used to examine your colon directly (sigmoidoscopy or colonoscopy). This is done to check for the earliest forms of colorectal cancer.  Routine screening usually begins at age 40.  Direct examination of the colon should be repeated every 5-10 years through 68 years of age. However, you may need to be screened more often if early forms of precancerous polyps or small growths are found.  Skin Cancer  Check your skin from head to toe regularly.  Tell your health care provider about any new moles or changes in moles, especially if there is a change in a mole's shape or color.  Also tell your health care provider if you have a mole that is larger than the size of a pencil eraser.  Always use sunscreen. Apply sunscreen liberally and repeatedly throughout the day.  Protect yourself by wearing long sleeves, pants, a wide-brimmed hat, and sunglasses whenever you are outside.  Heart disease, diabetes, and high blood pressure  High blood pressure causes heart disease and increases the risk of stroke. High blood pressure is more likely to develop  in: ? People who have blood pressure in the high end of the normal range (130-139/85-89 mm Hg). ? People who are overweight or obese. ? People who are African American.  If you are 90-56 years of age, have your blood pressure checked every 3-5 years. If you are 67 years of age or older, have your blood pressure checked every year. You should have your blood pressure measured twice-once when you are at a hospital or clinic, and once when you are not at a hospital or clinic. Record the average of the two measurements. To check your blood pressure when you are not at a hospital or clinic, you can use: ? An automated blood pressure machine at a pharmacy. ? A home blood pressure monitor.  If you are between 16 years and 33 years old, ask your health care provider if you should take aspirin to prevent strokes.  Have regular diabetes screenings. This involves taking a blood sample to check your fasting blood sugar level. ? If you are at a normal weight and have a low risk for diabetes, have this test once every three years after 68 years of age. ? If you are overweight and have a high risk for diabetes, consider being tested at a younger age or more often. Preventing infection Hepatitis B  If you have a higher risk for hepatitis B, you should be screened for this virus. You are considered at high risk for hepatitis B if: ? You were born in a country where hepatitis B is common. Ask your health care provider which countries are considered high risk. ? Your parents were born in a high-risk country, and you have not been immunized against hepatitis B (hepatitis B vaccine). ? You have HIV or AIDS. ? You use needles to inject street drugs. ? You live with someone who has hepatitis B. ? You have had sex with someone who has hepatitis B. ? You get hemodialysis treatment. ? You take certain medicines for conditions, including cancer, organ transplantation, and autoimmune conditions.  Hepatitis C  Blood  testing is recommended for: ? Everyone born from [birth year] through 1966. ? Anyone with known risk factors for hepatitis C.  Sexually transmitted infections (STIs)  You should be screened for sexually transmitted infections (STIs) including gonorrhea and chlamydia if: ? You are sexually active and are younger than 68 years of age. ? You are older than 68 years of age and your health care provider tells you that you are at risk for this type of infection. ? Your sexual activity  has changed since you were last screened and you are at an increased risk for chlamydia or gonorrhea. Ask your health care provider if you are at risk.  If you do not have HIV, but are at risk, it may be recommended that you take a prescription medicine daily to prevent HIV infection. This is called pre-exposure prophylaxis (PrEP). You are considered at risk if: ? You are sexually active and do not regularly use condoms or know the HIV status of your partner(s). ? You take drugs by injection. ? You are sexually active with a partner who has HIV.  Talk with your health care provider about whether you are at high risk of being infected with HIV. If you choose to begin PrEP, you should first be tested for HIV. You should then be tested every 3 months for as long as you are taking PrEP. Pregnancy  If you are premenopausal and you may become pregnant, ask your health care provider about preconception counseling.  If you may become pregnant, take 400 to 800 micrograms (mcg) of folic acid every day.  If you want to prevent pregnancy, talk to your health care provider about birth control (contraception). Osteoporosis and menopause  Osteoporosis is a disease in which the bones lose minerals and strength with aging. This can result in serious bone fractures. Your risk for osteoporosis can be identified using a bone density scan.  If you are 29 years of age or older, or if you are at risk for osteoporosis and fractures, ask your  health care provider if you should be screened.  Ask your health care provider whether you should take a calcium or vitamin D supplement to lower your risk for osteoporosis.  Menopause may have certain physical symptoms and risks.  Hormone replacement therapy may reduce some of these symptoms and risks. Talk to your health care provider about whether hormone replacement therapy is right for you. Follow these instructions at home:  Schedule regular health, dental, and eye exams.  Stay current with your immunizations.  Do not use any tobacco products including cigarettes, chewing tobacco, or electronic cigarettes.  If you are pregnant, do not drink alcohol.  If you are breastfeeding, limit how much and how often you drink alcohol.  Limit alcohol intake to no more than 1 drink per day for nonpregnant women. One drink equals 12 ounces of beer, 5 ounces of wine, or 1 ounces of hard liquor.  Do not use street drugs.  Do not share needles.  Ask your health care provider for help if you need support or information about quitting drugs.  Tell your health care provider if you often feel depressed.  Tell your health care provider if you have ever been abused or do not feel safe at home. This information is not intended to replace advice given to you by your health care provider. Make sure you discuss any questions you have with your health care provider. Document Released: 06/26/2011 Document Revised: 05/18/2016 Document Reviewed: 09/14/2015 Elsevier Interactive Patient Education  [employer].

## 2018-08-07 NOTE — Progress Notes (Signed)
Subjective:    Patient ID: Chloe Young, female    DOB: 1951/05/22, 67 y.o.   MRN: [id number]  HPI She is here for a physical exam.    She is concerned about her bone density.  Her sister has OP and she wonders if she needs to be on treatment.  She has no other concerns or questions.    Medications and allergies reviewed with patient and updated if appropriate.  Patient Active Problem List   Diagnosis Date Noted  . Lumbar radiculopathy 06/15/2016  . Cervicogenic headache 06/15/2016  . Osteopenia 06/15/2016  . GERD (gastroesophageal reflux disease) 06/15/2016  . Fasting hyperglycemia 11/25/2013  . Nonspecific abnormal electrocardiogram (ECG) (EKG) 05/28/2012  . Cervical post-laminectomy syndrome 03/18/2012  . Osteoarthritis of both knees 03/18/2012  . RAYNAUD'S SYNDROME 10/11/2010  . NECK PAIN, CHRONIC 04/19/2010  . Hyperlipidemia 04/19/2009  . Essential hypertension 04/19/2009    Current Outpatient Medications on File Prior to Visit  Medication Sig Dispense Refill  . Calcium Carbonate-Vitamin D (CALCIUM + D PO) Take 1 tablet by mouth daily.     . fluticasone (FLONASE) 50 MCG/ACT nasal spray USE 1 SPRAY IN EACH NOSTRIL TWO TIMES DAILY 48 g 1  . hydrochlorothiazide (MICROZIDE) 12.5 MG capsule TAKE 1 CAPSULE BY MOUTH  DAILY 90 capsule 0  . HYDROcodone-acetaminophen (NORCO) 7.5-325 MG per tablet Take 1 tablet by mouth every 6 (six) hours as needed.    Marland Kitchen LYRICA 150 MG capsule     . metoprolol tartrate (LOPRESSOR) 50 MG tablet TAKE ONE-HALF TABLET BY  MOUTH TWICE A DAY 90 tablet 0  . Multiple Vitamin (MULITIVITAMIN WITH MINERALS) TABS Take 1 tablet by mouth daily.     Marland Kitchen omeprazole (PRILOSEC) 20 MG capsule Take 1 capsule (20 mg total) by mouth daily. Follow-up appt is due must see MD for future refills 30 capsule 0  . rosuvastatin (CRESTOR) 5 MG tablet Take 1 tablet (5 mg total) by mouth daily. 30 tablet 2  . topiramate (TOPAMAX) 100 MG tablet Take 2 tablets by mouth 2 (two)  times daily.    Marland Kitchen triamcinolone ointment (KENALOG) 0.5 % APPLY TWICE DAILY. 30 g 0   No current facility-administered medications on file prior to visit.     Past Medical History:  Diagnosis Date  . Cervical facet syndrome   . Cervical post-laminectomy syndrome    Dr T[facility]  . Cervicalgia   . Chronic pain syndrome   . Hyperlipidemia    joint pain with statin  . Hypertension   . Osteoarthritis, localized, knee    OA AND PAIN LEFT KNEE  -- S/P RIGHT  KNEE ARTHROPLASTY ON 82012--KNEE IS DOING WELL.  . Osteopenia 06/2016    T score -1.6 FRAX not calculated    Past Surgical History:  Procedure Laterality Date  . CERVIAL FUSION  2006   Dr Arnoldo Morale, [place]  . COLONOSCOPY  2011    Dr Fuller Plan  . ESI  09/03/13   L 5- S1 ; Dr Nelva Bush  . JOINT REPLACEMENT  AUG 2012    RIGHT KNEE ARTHROPLASTY  . KNEE ARTHROSCOPY     bilaterally; Dr Tonita Cong  . TOTAL KNEE ARTHROPLASTY  01/11/2012   Procedure: TOTAL KNEE ARTHROPLASTY;  Surgeon: Johnn Hai, MD;  Location: [facility];  Service: Orthopedics;  Laterality: Left;  General with Femoral nerve block    Social History   Socioeconomic History  . Marital status: Married    Spouse name: Not on file  .  Number of children: 1  . Years of education: Not on file  . Highest education level: Not on file  Occupational History  . Occupation: HAIRDRESSER    Employer: [employer]  . Financial resource strain: Not on file  . Food insecurity:    Worry: Not on file    Inability: Not on file  . Transportation needs:    Medical: Not on file    Non-medical: Not on file  Tobacco Use  . Smoking status: Never Smoker  . Smokeless tobacco: Never Used  Substance and Sexual Activity  . Alcohol use: Yes    Alcohol/week: 14.0 standard drinks    Types: 14 Shots of liquor per week    Comment: 2 DRINKS PER NIGHT  . Drug use: No  . Sexual activity: Yes    Birth control/protection: Post-menopausal  Lifestyle  . Physical activity:     Days per week: Not on file    Minutes per session: Not on file  . Stress: Not on file  Relationships  . Social connections:    Talks on phone: Not on file    Gets together: Not on file    Attends religious service: Not on file    Active member of club or organization: Not on file    Attends meetings of clubs or organizations: Not on file    Relationship status: Not on file  Other Topics Concern  . Not on file  Social History Narrative  . Not on file    Family History  Problem Relation Age of Onset  . Diverticulitis Mother 62       with fissures.Marland Kitchenopted for no surgery  . Cancer Father 70        malignant brain tumor  . Breast cancer Maternal Aunt   . Diabetes Maternal Grandfather   . Heart failure Maternal Grandfather   . Stroke Maternal Uncle        in 54s  . Heart disease Maternal Uncle        X4 ; 1 had MI @ 60  . Hypertension Sister     Review of Systems  Constitutional: Negative for chills, fatigue and fever.  Eyes: Negative for visual disturbance.  Respiratory: Negative for cough, shortness of breath and wheezing.   Cardiovascular: Negative for chest pain, palpitations and leg swelling.  Gastrointestinal: Negative for abdominal pain, blood in stool, constipation, diarrhea and nausea.  Genitourinary: Negative for dysuria and hematuria.  Musculoskeletal: Positive for back pain (minimal) and neck pain. Negative for arthralgias.  Skin: Negative for color change and wound.  Neurological: Positive for headaches (cervicogenic). Negative for dizziness and light-headedness.  Psychiatric/Behavioral: Negative for dysphoric mood and sleep disturbance. The patient is not nervous/anxious.        Objective:   Vitals:   08/08/18 0907  BP: 108/72  Pulse: 66  Resp: 16  Temp: 98.3 F (36.8 C)  SpO2: 98%   Filed Weights   08/08/18 0907  Weight: 146 lb (66.2 kg)   Body mass index is 25.86 kg/m.  Wt Readings from Last 3 Encounters:  08/08/18 146 lb (66.2 kg)  11/16/17  149 lb (67.6 kg)  07/31/17 148 lb (67.1 kg)     Physical Exam Constitutional: She appears well-developed and well-nourished. No distress.  HENT:  Head: Normocephalic and atraumatic.  Right Ear: External ear normal. Normal ear canal and TM Left Ear: External ear normal.  Normal ear canal and TM Mouth/Throat: Oropharynx is clear and moist.  Eyes:  Conjunctivae and EOM are normal.  Neck: Neck supple. No tracheal deviation present. No thyromegaly present.  No carotid bruit  Cardiovascular: Normal rate, regular rhythm and normal heart sounds.   No murmur heard.  No edema. Pulmonary/Chest: Effort normal and breath sounds normal. No respiratory distress. She has no wheezes. She has no rales.  Breast: deferred to Gyn Abdominal: Soft. She exhibits no distension. There is no tenderness.  Lymphadenopathy: She has no cervical adenopathy.  Skin: Skin is warm and dry. She is not diaphoretic.  Psychiatric: She has a normal mood and affect. Her behavior is normal.        Assessment & Plan:   Physical exam: Screening blood work   ordered Immunizations   Discussed shingrix, others up to date Colonoscopy   Up to date  Mammogram    Up to date  Gyn  Not current seeing Dexa     Up to date  Eye exams    Up to date  EKG   Done 07/2017 Exercise     Y - 3/week - water exercises, walks dog daily - about 1 mile Weight  Weight is good for age Skin  No concerns Substance abuse    2-3 drinks a night - gin and tonic - advised to drink no more than one drink a night.   See Problem List for Assessment and Plan of chronic medical problems.   FU in 6 months

## 2018-08-08 ENCOUNTER — Ambulatory Visit (INDEPENDENT_AMBULATORY_CARE_PROVIDER_SITE_OTHER): Payer: Medicare Other | Admitting: Internal Medicine

## 2018-08-08 ENCOUNTER — Encounter: Payer: Self-pay | Admitting: Internal Medicine

## 2018-08-08 ENCOUNTER — Other Ambulatory Visit (INDEPENDENT_AMBULATORY_CARE_PROVIDER_SITE_OTHER): Payer: Medicare Other

## 2018-08-08 VITALS — BP 108/72 | HR 66 | Temp 98.3°F | Resp 16 | Ht 63.0 in | Wt 146.0 lb

## 2018-08-08 DIAGNOSIS — I10 Essential (primary) hypertension: Secondary | ICD-10-CM | POA: Diagnosis not present

## 2018-08-08 DIAGNOSIS — K219 Gastro-esophageal reflux disease without esophagitis: Secondary | ICD-10-CM | POA: Diagnosis not present

## 2018-08-08 DIAGNOSIS — M85852 Other specified disorders of bone density and structure, left thigh: Secondary | ICD-10-CM

## 2018-08-08 DIAGNOSIS — Z0000 Encounter for general adult medical examination without abnormal findings: Secondary | ICD-10-CM

## 2018-08-08 DIAGNOSIS — M85851 Other specified disorders of bone density and structure, right thigh: Secondary | ICD-10-CM | POA: Diagnosis not present

## 2018-08-08 DIAGNOSIS — R7301 Impaired fasting glucose: Secondary | ICD-10-CM

## 2018-08-08 DIAGNOSIS — R51 Headache: Secondary | ICD-10-CM

## 2018-08-08 DIAGNOSIS — M542 Cervicalgia: Secondary | ICD-10-CM

## 2018-08-08 DIAGNOSIS — E782 Mixed hyperlipidemia: Secondary | ICD-10-CM

## 2018-08-08 DIAGNOSIS — G4486 Cervicogenic headache: Secondary | ICD-10-CM

## 2018-08-08 LAB — LIPID PANEL
CHOLESTEROL: 179 mg/dL (ref 0–200)
HDL: 76.7 mg/dL (ref >39.00)
LDL Cholesterol: 88 mg/dL (ref 0–99)
NONHDL: 102.03
Total CHOL/HDL Ratio: 2
Triglycerides: 72 mg/dL (ref 0.0–149.0)
VLDL: 14.4 mg/dL (ref 0.0–40.0)

## 2018-08-08 LAB — CBC WITH DIFFERENTIAL/PLATELET
Basophils Absolute: 0 10*3/uL (ref 0.0–0.1)
Basophils Relative: 0.2 % (ref 0.0–3.0)
EOS ABS: 0.1 10*3/uL (ref 0.0–0.7)
EOS PCT: 3.3 % (ref 0.0–5.0)
HCT: 38.3 % (ref 36.0–46.0)
Hemoglobin: 13.3 g/dL (ref 12.0–15.0)
LYMPHS ABS: 1.6 10*3/uL (ref 0.7–4.0)
Lymphocytes Relative: 36 % (ref 12.0–46.0)
MCHC: 34.6 g/dL (ref 30.0–36.0)
MCV: 92.6 fl (ref 78.0–100.0)
MONO ABS: 0.4 10*3/uL (ref 0.1–1.0)
Monocytes Relative: 8.7 % (ref 3.0–12.0)
NEUTROS PCT: 51.8 % (ref 43.0–77.0)
Neutro Abs: 2.3 10*3/uL (ref 1.4–7.7)
Platelets: 220 10*3/uL (ref 150.0–400.0)
RBC: 4.14 Mil/uL (ref 3.87–5.11)
RDW: 13.3 % (ref 11.5–15.5)
WBC: 4.4 10*3/uL (ref 4.0–10.5)

## 2018-08-08 LAB — COMPREHENSIVE METABOLIC PANEL
ALK PHOS: 86 U/L (ref 39–117)
ALT: 25 U/L (ref 0–35)
AST: 26 U/L (ref 0–37)
Albumin: 4.4 g/dL (ref 3.5–5.2)
BILIRUBIN TOTAL: 0.4 mg/dL (ref 0.2–1.2)
BUN: 13 mg/dL (ref 6–23)
CO2: 30 mEq/L (ref 19–32)
CREATININE: 0.76 mg/dL (ref 0.40–1.20)
Calcium: 10 mg/dL (ref 8.4–10.5)
Chloride: 102 mEq/L (ref 96–112)
GFR: 80.38 mL/min (ref >60.00)
GLUCOSE: 101 mg/dL — AB (ref 70–99)
Potassium: 3.5 mEq/L (ref 3.5–5.1)
SODIUM: 139 meq/L (ref 135–145)
TOTAL PROTEIN: 7.4 g/dL (ref 6.0–8.3)

## 2018-08-08 LAB — TSH: TSH: 1.77 u[IU]/mL (ref 0.35–4.50)

## 2018-08-08 LAB — VITAMIN D 25 HYDROXY (VIT D DEFICIENCY, FRACTURES): VITD: 45.72 ng/mL (ref 30.00–100.00)

## 2018-08-08 MED ORDER — RANITIDINE HCL 300 MG PO TABS: 300.0000 mg | ORAL_TABLET | Freq: Every day | ORAL | 3 refills | Status: DC

## 2018-08-08 MED ORDER — ALENDRONATE SODIUM 70 MG PO TABS: 70.0000 mg | ORAL_TABLET | ORAL | 3 refills | Status: DC

## 2018-08-08 MED ORDER — TRIAMCINOLONE ACETONIDE 0.5 % EX CREA: 1.0000 "application " | TOPICAL_CREAM | Freq: Two times a day (BID) | CUTANEOUS | 0 refills | Status: DC | PRN

## 2018-08-08 NOTE — Assessment & Plan Note (Signed)
Injections Q 3 months by Dr Maryjean Ka

## 2018-08-08 NOTE — Assessment & Plan Note (Addendum)
Taking omeprazole 20 mg daily gerd controlled Trial of zantac 300 mg at bedtime, taper off omeprazole

## 2018-08-08 NOTE — Assessment & Plan Note (Signed)
Check lipid panel  Continue daily statin Regular exercise and healthy diet encouraged  

## 2018-08-08 NOTE — Assessment & Plan Note (Addendum)
reveiwed dexa Taking calcium and vitamin d - advised specific amounts Check vitamin d level Stressed regular exercise Decrease alcohol intake Taper off omeprazole, start zantac She has higher frax and wants to be aggressive - will start fosamax weekly Repeat dexa in 2 years

## 2018-08-08 NOTE — Assessment & Plan Note (Addendum)
Follows with pain management  Taking topamax Takes norco only if needed

## 2018-08-08 NOTE — Assessment & Plan Note (Signed)
BP well controlled Current regimen effective and well tolerated Continue current medications at current doses cmp  

## 2018-08-08 NOTE — Assessment & Plan Note (Signed)
a1c

## 2018-08-09 ENCOUNTER — Telehealth: Payer: Self-pay | Admitting: Emergency Medicine

## 2018-08-13 ENCOUNTER — Other Ambulatory Visit: Payer: Self-pay | Admitting: Internal Medicine

## 2018-08-15 ENCOUNTER — Telehealth: Payer: Self-pay

## 2018-08-15 NOTE — Telephone Encounter (Signed)
Spoke with pt and advised you were out of the office at this time and would get back to her tomorrow on this issue. Pt understood.

## 2018-08-15 NOTE — Telephone Encounter (Signed)
Copied from [place] [id number]. Topic: General - Other >> Aug 15, 2018  2:31 PM Mcneil, Ja-Kwan wrote: Reason for CRM: Pt states the indigestion has come back and she would like Dr. Quay Burow to be aware. Pt requests a call back. Cb# [phone]

## 2018-08-15 NOTE — Telephone Encounter (Signed)
If she has tapered off the omeprazole - have her restart it.  Continue omeprazole for now

## 2018-08-16 NOTE — Telephone Encounter (Signed)
Pt aware of response below.  

## 2018-08-19 NOTE — Telephone Encounter (Signed)
error 

## 2018-09-24 DIAGNOSIS — M542 Cervicalgia: Secondary | ICD-10-CM | POA: Diagnosis not present

## 2018-09-26 ENCOUNTER — Other Ambulatory Visit: Payer: Self-pay | Admitting: Internal Medicine

## 2018-09-26 MED ORDER — OMEPRAZOLE 20 MG PO CPDR: 20.0000 mg | DELAYED_RELEASE_CAPSULE | Freq: Every day | ORAL | 0 refills | Status: DC

## 2018-09-26 NOTE — Telephone Encounter (Signed)
Copied from [place] [id number]. Topic: Quick Communication - Rx Refill/Question >> Sep 26, 2018 11:46 AM Alfredia Ferguson R wrote: Medication: omeprazole (PRILOSEC) 20 MG capsule  Has the patient contacted their pharmacy? Yes  Preferred Pharmacy (with phone number or street name): [facility], [place] [phone] (Phone) [phone] (Fax)    Agent: Please be advised that RX refills may take up to 3 business days. We ask that you follow-up with your pharmacy.

## 2018-09-26 NOTE — Telephone Encounter (Signed)
Requested Prescriptions  Pending Prescriptions Disp Refills  . omeprazole (PRILOSEC) 20 MG capsule 30 capsule 0    Sig: Take 1 capsule (20 mg total) by mouth daily. Follow-up appt is due must see MD for future refills     Gastroenterology: Proton Pump Inhibitors Passed - 09/26/2018 12:06 PM      Passed - Valid encounter within last 12 months    Recent Outpatient Visits          1 month ago Preventative health care   [place], MD   10 months ago Dermatitis   [place], Crosby Oyster, DO   1 year ago Medicare annual wellness visit, subsequent   [facility] -Nicanor Bake, Claudina Lick, MD   2 years ago Essential hypertension   [place], Claudina Lick, MD   3 years ago Hyperlipidemia   [facility] -Tressia Danas, Darrick Penna, MD

## 2018-09-30 ENCOUNTER — Other Ambulatory Visit: Payer: Self-pay | Admitting: Internal Medicine

## 2018-10-08 DIAGNOSIS — Z23 Encounter for immunization: Secondary | ICD-10-CM | POA: Diagnosis not present

## 2018-10-10 ENCOUNTER — Ambulatory Visit: Payer: Medicare Other

## 2018-11-14 ENCOUNTER — Other Ambulatory Visit: Payer: Self-pay | Admitting: Internal Medicine

## 2018-11-19 ENCOUNTER — Other Ambulatory Visit: Payer: Self-pay | Admitting: Internal Medicine

## 2018-11-27 DIAGNOSIS — I10 Essential (primary) hypertension: Secondary | ICD-10-CM | POA: Diagnosis not present

## 2018-11-27 DIAGNOSIS — G8929 Other chronic pain: Secondary | ICD-10-CM | POA: Diagnosis not present

## 2018-11-27 DIAGNOSIS — M545 Low back pain: Secondary | ICD-10-CM | POA: Diagnosis not present

## 2018-11-27 DIAGNOSIS — M542 Cervicalgia: Secondary | ICD-10-CM | POA: Diagnosis not present

## 2018-12-04 ENCOUNTER — Telehealth: Payer: Self-pay | Admitting: Internal Medicine

## 2018-12-04 NOTE — Telephone Encounter (Signed)
Patient scheduled AWV for 10/10/18, but cancelled appt stating that she did not feel she needed to have it. (Declined AWV) SF.

## 2018-12-24 ENCOUNTER — Other Ambulatory Visit: Payer: Self-pay | Admitting: Internal Medicine

## 2018-12-24 MED ORDER — ALENDRONATE SODIUM 70 MG PO TABS: ORAL_TABLET | ORAL | 8 refills | Status: DC

## 2018-12-24 NOTE — Telephone Encounter (Signed)
Requested Prescriptions  Pending Prescriptions Disp Refills  . alendronate (FOSAMAX) 70 MG tablet 4 tablet 8    Sig: TAKE 1 TABLET EVERY 7 DAYS. TAKE WITH A FULL GLASS OF WATER ON AN EMPTY STOMACH.     Endocrinology:  Bisphosphonates Failed - 12/24/2018  9:23 AM      Failed - Vit D in normal range and within 360 days    VITD  Date Value Ref Range Status  08/08/2018 45.72 30.00 - 100.00 ng/mL Final         Passed - Ca in normal range and within 360 days    Calcium  Date Value Ref Range Status  08/08/2018 10.0 8.4 - 10.5 mg/dL Final         Passed - Valid encounter within last 12 months    Recent Outpatient Visits          4 months ago Preventative health care   [place], Claudina Lick, MD   1 year ago Dermatitis   [place], Crosby Oyster, DO   1 year ago Medicare annual wellness visit, subsequent   [facility] -Nicanor Bake, Claudina Lick, MD   2 years ago Essential hypertension   [place], Claudina Lick, MD   3 years ago Hyperlipidemia   [facility] -Tressia Danas, Darrick Penna, MD

## 2018-12-24 NOTE — Telephone Encounter (Signed)
Copied from [place] [id number]. Topic: Quick Communication - Rx Refill/Question >> Dec 24, 2018  9:08 AM Waylan Rocher, Lumin L wrote: Medication: alendronate (FOSAMAX) 70 MG tablet (patient wants to know why there are never refills on the script? Please advise)  Has the patient contacted their pharmacy? Yes.   (Agent: If no, request that the patient contact the pharmacy for the refill.) (Agent: If yes, when and what did the pharmacy advise?)  Preferred Pharmacy (with phone number or street name): [facility], [place] [address] Phone: [phone] Fax: [phone]  Agent: Please be advised that RX refills may take up to 3 business days. We ask that you follow-up with your pharmacy.

## 2018-12-26 DIAGNOSIS — M542 Cervicalgia: Secondary | ICD-10-CM | POA: Diagnosis not present

## 2019-02-04 DIAGNOSIS — G8929 Other chronic pain: Secondary | ICD-10-CM | POA: Diagnosis not present

## 2019-02-04 DIAGNOSIS — M545 Low back pain: Secondary | ICD-10-CM | POA: Diagnosis not present

## 2019-02-04 DIAGNOSIS — M542 Cervicalgia: Secondary | ICD-10-CM | POA: Diagnosis not present

## 2019-03-05 ENCOUNTER — Other Ambulatory Visit: Payer: Self-pay | Admitting: Internal Medicine

## 2019-03-20 DIAGNOSIS — M47812 Spondylosis without myelopathy or radiculopathy, cervical region: Secondary | ICD-10-CM | POA: Diagnosis not present

## 2019-04-24 DIAGNOSIS — M47812 Spondylosis without myelopathy or radiculopathy, cervical region: Secondary | ICD-10-CM | POA: Diagnosis not present

## 2019-05-07 ENCOUNTER — Other Ambulatory Visit: Payer: Self-pay | Admitting: Internal Medicine

## 2019-05-07 NOTE — Progress Notes (Signed)
Virtual Visit via Video Note  I connected with Chloe Young on 05/08/19 at 10:15 AM EDT by a video enabled telemedicine application and verified that I am speaking with the correct person using two identifiers.   I discussed the limitations of evaluation and management by telemedicine and the availability of in person appointments. The patient expressed understanding and agreed to proceed.  The patient is currently at home and I am in the office.    No referring provider.    History of Present Illness: She is here for follow up of her chronic medical conditions.    She is exercising regularly - walking 1.25 miles daily.    She feels well.  She denies any changes in her health and has no concerns.  Hypertension: She is taking her medication daily. She is compliant with a low sodium diet.  She denies chest pain, palpitations, edema, shortness of breath and regular headaches. She does not monitor her blood pressure at home.    GERD:  She is taking her medication daily as prescribed.  She denies any GERD symptoms and feels her GERD is well controlled.   Osteoporosis: She is taking Fosamax weekly.  She takes calcium and vitamin D daily.  She is exercising regularly.  Hyperlipidemia: She is taking her medication daily. She is compliant with a low fat/cholesterol diet. She denies myalgias.   Hyperglycemia: She is compliant with a low sugar/carbohydrate diet.  She is exercising.   Review of Systems  Constitutional: Negative for chills and fever.  Respiratory: Negative for cough, shortness of breath and wheezing.   Cardiovascular: Negative for chest pain, palpitations and leg swelling.  Gastrointestinal: Negative for heartburn.  Musculoskeletal: Negative for myalgias.  Neurological: Negative for dizziness and headaches.     Social History   Socioeconomic History  . Marital status: Married    Spouse name: Not on file  . Number of children: 1  . Years of education: Not on file  .  Highest education level: Not on file  Occupational History  . Occupation: HAIRDRESSER    Employer: [employer]  . Financial resource strain: Not on file  . Food insecurity:    Worry: Not on file    Inability: Not on file  . Transportation needs:    Medical: Not on file    Non-medical: Not on file  Tobacco Use  . Smoking status: Never Smoker  . Smokeless tobacco: Never Used  Substance and Sexual Activity  . Alcohol use: Yes    Alcohol/week: 14.0 standard drinks    Types: 14 Shots of liquor per week    Comment: 2 DRINKS PER NIGHT  . Drug use: No  . Sexual activity: Yes    Birth control/protection: Post-menopausal  Lifestyle  . Physical activity:    Days per week: Not on file    Minutes per session: Not on file  . Stress: Not on file  Relationships  . Social connections:    Talks on phone: Not on file    Gets together: Not on file    Attends religious service: Not on file    Active member of club or organization: Not on file    Attends meetings of clubs or organizations: Not on file    Relationship status: Not on file  Other Topics Concern  . Not on file  Social History Narrative  . Not on file     Observations/Objective: Appears well in NAD   BP at home controlled  she does not have any specific numbers.  Assessment and Plan:  See Problem List for Assessment and Plan of chronic medical problems.   Follow Up Instructions:    I discussed the assessment and treatment plan with the patient. The patient was provided an opportunity to ask questions and all were answered. The patient agreed with the plan and demonstrated an understanding of the instructions.   The patient was advised to call back or seek an in-person evaluation if the symptoms worsen or if the condition fails to improve as anticipated.  In 6 months in the office with blood work at that time  Binnie Rail, MD

## 2019-05-08 ENCOUNTER — Encounter: Payer: Self-pay | Admitting: Internal Medicine

## 2019-05-08 ENCOUNTER — Ambulatory Visit (INDEPENDENT_AMBULATORY_CARE_PROVIDER_SITE_OTHER): Payer: Medicare Other | Admitting: Internal Medicine

## 2019-05-08 DIAGNOSIS — K219 Gastro-esophageal reflux disease without esophagitis: Secondary | ICD-10-CM

## 2019-05-08 DIAGNOSIS — I10 Essential (primary) hypertension: Secondary | ICD-10-CM

## 2019-05-08 DIAGNOSIS — R7301 Impaired fasting glucose: Secondary | ICD-10-CM | POA: Diagnosis not present

## 2019-05-08 DIAGNOSIS — E782 Mixed hyperlipidemia: Secondary | ICD-10-CM | POA: Diagnosis not present

## 2019-05-08 DIAGNOSIS — M85851 Other specified disorders of bone density and structure, right thigh: Secondary | ICD-10-CM

## 2019-05-08 DIAGNOSIS — M85852 Other specified disorders of bone density and structure, left thigh: Secondary | ICD-10-CM

## 2019-05-08 NOTE — Assessment & Plan Note (Signed)
BP Readings from Last 3 Encounters:  08/08/18 108/72  11/16/17 136/78  07/31/17 112/84   BP well controlled Current regimen effective and well tolerated Continue current medications at current doses Follow-up in 6 months with blood work at that time

## 2019-05-08 NOTE — Assessment & Plan Note (Signed)
Lab Results  Component Value Date   HGBA1C 5.6 07/31/2017   Continue low sugar/carbohydrate diet Continue regular exercise A1c in 6 months at her next visit

## 2019-05-08 NOTE — Assessment & Plan Note (Signed)
Continue statin. 

## 2019-05-08 NOTE — Assessment & Plan Note (Signed)
Near osteopenia with high FRAX-she wants to be very aggressive with treatment Taking Fosamax weekly-continue Taking calcium and vitamin D daily Exercising regularly DEXA up-to-date-due 2021

## 2019-05-08 NOTE — Assessment & Plan Note (Signed)
GERD controlled Continue daily medication  

## 2019-05-14 ENCOUNTER — Other Ambulatory Visit: Payer: Self-pay

## 2019-05-14 ENCOUNTER — Other Ambulatory Visit: Payer: Self-pay | Admitting: Internal Medicine

## 2019-05-14 MED ORDER — ROSUVASTATIN CALCIUM 5 MG PO TABS: 5.0000 mg | ORAL_TABLET | Freq: Every day | ORAL | 1 refills | Status: DC

## 2019-05-29 ENCOUNTER — Telehealth: Payer: Self-pay | Admitting: Internal Medicine

## 2019-05-29 NOTE — Telephone Encounter (Signed)
-----   Message from Binnie Rail, MD sent at 05/08/2019 10:29 AM EDT ----- She needs a CPE in the fall sept or oct is ok or sometime - see if she wants to do a wellness at that time with Sharee Pimple

## 2019-05-29 NOTE — Telephone Encounter (Signed)
Left message for patient to call back to schedule.  °

## 2019-07-03 DIAGNOSIS — M542 Cervicalgia: Secondary | ICD-10-CM | POA: Diagnosis not present

## 2019-07-03 DIAGNOSIS — I10 Essential (primary) hypertension: Secondary | ICD-10-CM | POA: Diagnosis not present

## 2019-07-03 NOTE — Progress Notes (Deleted)
Subjective:   Chloe Young is a 68 y.o. female who presents for Medicare Annual (Subsequent) preventive examination. I connected with patient by a telephone and verified that I am speaking with the correct person using two identifiers. Patient stated full name and DOB. Patient gave permission to continue with telephonic visit. Patient's location was at home and Nurse's location was at [place] office.  Review of Systems:     Sleep patterns: {SX; SLEEP PATTERNS:18802::"feels rested on waking","does not get up to void","gets up *** times nightly to void","sleeps *** hours nightly"}.    Home Safety/Smoke Alarms: Feels safe in home. Smoke alarms in place.  Living environment; residence and Firearm Safety: {Rehab home environment / accessibility:30080::"no firearms","firearms stored safely"}. Seat Belt Safety/Bike Helmet: Wears seat belt.      Objective:     Vitals: There were no vitals taken for this visit.  There is no height or weight on file to calculate BMI.  Advanced Directives 01/11/2012 01/08/2012  Does Patient Have a Medical Advance Directive? Patient does not have advance directive Patient does not have advance directive;Patient would not like information  Pre-existing out of facility DNR order (yellow form or pink MOST form) No -    Tobacco Social History   Tobacco Use  Smoking Status Never Smoker  Smokeless Tobacco Never Used     Counseling given: Not Answered  Past Medical History:  Diagnosis Date  . Cervical facet syndrome   . Cervical post-laminectomy syndrome    Dr Tessa Lerner[facility]  . Cervicalgia   . Chronic pain syndrome   . Hyperlipidemia    joint pain with statin  . Hypertension   . Osteoarthritis, localized, knee    OA AND PAIN LEFT KNEE  -- S/P RIGHT  KNEE ARTHROPLASTY ON 82012--KNEE IS DOING WELL.  . Osteopenia 06/2016    T score -1.6 FRAX not calculated   Past Surgical History:  Procedure Laterality Date  . CERVIAL FUSION  2006   Dr Arnoldo Morale,  [facility]  . COLONOSCOPY  2011    Dr Fuller Plan  . ESI  09/03/13   L 5- S1 ; Dr Nelva Bush  . JOINT REPLACEMENT  AUG 2012    RIGHT KNEE ARTHROPLASTY  . KNEE ARTHROSCOPY     bilaterally; Dr Tonita Cong  . TOTAL KNEE ARTHROPLASTY  01/11/2012   Procedure: TOTAL KNEE ARTHROPLASTY;  Surgeon: Johnn Hai, MD;  Location: [facility];  Service: Orthopedics;  Laterality: Left;  General with Femoral nerve block   Family History  Problem Relation Age of Onset  . Diverticulitis Mother 41       with fissures.Marland Kitchenopted for no surgery  . Cancer Father 83        malignant brain tumor  . Breast cancer Maternal Aunt   . Diabetes Maternal Grandfather   . Heart failure Maternal Grandfather   . Stroke Maternal Uncle        in 6s  . Heart disease Maternal Uncle        X4 ; 1 had MI @ 55  . Hypertension Sister    Social History   Socioeconomic History  . Marital status: Married    Spouse name: Not on file  . Number of children: 1  . Years of education: Not on file  . Highest education level: Not on file  Occupational History  . Occupation: HAIRDRESSER    Employer: [employer]  . Financial resource strain: Not on file  . Food insecurity  Worry: Not on file    Inability: Not on file  . Transportation needs    Medical: Not on file    Non-medical: Not on file  Tobacco Use  . Smoking status: Never Smoker  . Smokeless tobacco: Never Used  Substance and Sexual Activity  . Alcohol use: Yes    Alcohol/week: 14.0 standard drinks    Types: 14 Shots of liquor per week    Comment: 2 DRINKS PER NIGHT  . Drug use: No  . Sexual activity: Yes    Birth control/protection: Post-menopausal  Lifestyle  . Physical activity    Days per week: Not on file    Minutes per session: Not on file  . Stress: Not on file  Relationships  . Social Herbalist on phone: Not on file    Gets together: Not on file    Attends religious service: Not on file    Active member of club or organization: Not on  file    Attends meetings of clubs or organizations: Not on file    Relationship status: Not on file  Other Topics Concern  . Not on file  Social History Narrative  . Not on file    Outpatient Encounter Medications as of 07/04/2019  Medication Sig  . alendronate (FOSAMAX) 70 MG tablet TAKE 1 TABLET EVERY 7 DAYS. TAKE WITH A FULL GLASS OF WATER ON AN EMPTY STOMACH.  . Calcium Carbonate-Vitamin D (CALCIUM + D PO) Take 1 tablet by mouth daily.   . fluticasone (FLONASE) 50 MCG/ACT nasal spray USE 1 SPRAY IN EACH NOSTRIL TWO TIMES DAILY  . hydrochlorothiazide (MICROZIDE) 12.5 MG capsule TAKE 1 CAPSULE BY MOUTH  DAILY.  Marland Kitchen HYDROcodone-acetaminophen (NORCO) 7.5-325 MG per tablet Take 1 tablet by mouth every 6 (six) hours as needed.  Marland Kitchen LYRICA 150 MG capsule   . metoprolol tartrate (LOPRESSOR) 50 MG tablet TAKE ONE-HALF TABLET BY  MOUTH TWICE DAILY  . Multiple Vitamin (MULITIVITAMIN WITH MINERALS) TABS Take 1 tablet by mouth daily.   Marland Kitchen omeprazole (PRILOSEC) 20 MG capsule Take 1 capsule by mouth daily.  . ranitidine (ZANTAC) 300 MG tablet Take 1 tablet (300 mg total) by mouth at bedtime.  . rosuvastatin (CRESTOR) 5 MG tablet Take 1 tablet (5 mg total) by mouth daily.  Marland Kitchen topiramate (TOPAMAX) 100 MG tablet Take 2 tablets by mouth 2 (two) times daily.  Marland Kitchen triamcinolone cream (KENALOG) 0.5 % APPLY 1 APPLICATION  TOPICALLY 2 (TWO) TIMES  DAILY AS NEEDED.   No facility-administered encounter medications on file as of 07/04/2019.     Activities of Daily Living No flowsheet data found.  Patient Care Team: Binnie Rail, MD as PCP - General (Internal Medicine)    Assessment:   This is a routine wellness examination for Chloe Young. Physical assessment deferred to PCP.  Exercise Activities and Dietary recommendations   Diet (meal preparation, eat out, water intake, caffeinated beverages, dairy products, fruits and vegetables): {Desc; diets:16563}   Goals    . patient     Keep active; staying mobile        Fall Risk Fall Risk  11/16/2017 07/31/2017 07/25/2016 06/15/2016  Falls in the past year? No No No No    Depression Screen PHQ 2/9 Scores 11/16/2017 07/31/2017 07/31/2017 07/25/2016  PHQ - 2 Score 0 0 0 0  PHQ- 9 Score - 0 - -     Cognitive Function        Immunization History  Administered Date(s) Administered  .  Influenza, High Dose Seasonal PF 10/08/2018  . Influenza-Unspecified 11/03/2013, 09/29/2014  . Pneumococcal Conjugate-13 06/15/2016  . Pneumococcal Polysaccharide-23 07/31/2017  . Td 04/19/2009  . Zoster 06/04/2012  . Zoster Recombinat (Shingrix) 08/09/2018, 10/30/2018   Screening Tests Health Maintenance  Topic Date Due  . TETANUS/TDAP  04/20/2019  . INFLUENZA VACCINE  07/26/2019  . MAMMOGRAM  08/09/2019  . COLONOSCOPY  05/31/2020  . DEXA SCAN  07/04/2020  . Hepatitis C Screening  Completed  . PNA vac Low Risk Adult  Completed       Plan:   I have personally reviewed and noted the following in the patient's chart:   . Medical and social history . Use of alcohol, tobacco or illicit drugs  . Current medications and supplements . Functional ability and status . Nutritional status . Physical activity . Advanced directives . List of other physicians . Screenings to include cognitive, depression, and falls . Referrals and appointments  In addition, I have reviewed and discussed with patient certain preventive protocols, quality metrics, and best practice recommendations. A written personalized care plan for preventive services as well as general preventive health recommendations were provided to patient.     Michiel Cowboy, RN  07/03/2019

## 2019-07-04 ENCOUNTER — Ambulatory Visit: Payer: Medicare Other

## 2019-07-04 ENCOUNTER — Other Ambulatory Visit: Payer: Self-pay

## 2019-07-09 ENCOUNTER — Other Ambulatory Visit: Payer: Self-pay | Admitting: Neurosurgery

## 2019-07-09 DIAGNOSIS — Q761 Klippel-Feil syndrome: Secondary | ICD-10-CM

## 2019-07-15 ENCOUNTER — Other Ambulatory Visit: Payer: Self-pay | Admitting: Internal Medicine

## 2019-07-16 ENCOUNTER — Other Ambulatory Visit: Payer: Self-pay

## 2019-07-16 MED ORDER — ALENDRONATE SODIUM 70 MG PO TABS: ORAL_TABLET | ORAL | 1 refills | Status: DC

## 2019-08-02 ENCOUNTER — Other Ambulatory Visit: Payer: Self-pay

## 2019-08-02 ENCOUNTER — Ambulatory Visit
Admission: RE | Admit: 2019-08-02 | Discharge: 2019-08-02 | Disposition: A | Discharge status: Home / Self Care | Payer: Medicare Other | Source: Ambulatory Visit | Attending: Neurosurgery | Admitting: Neurosurgery

## 2019-08-02 DIAGNOSIS — Q761 Klippel-Feil syndrome: Secondary | ICD-10-CM

## 2019-08-02 DIAGNOSIS — M4802 Spinal stenosis, cervical region: Secondary | ICD-10-CM | POA: Diagnosis not present

## 2019-08-06 DIAGNOSIS — Q761 Klippel-Feil syndrome: Secondary | ICD-10-CM | POA: Diagnosis not present

## 2019-08-06 DIAGNOSIS — R030 Elevated blood-pressure reading, without diagnosis of hypertension: Secondary | ICD-10-CM | POA: Diagnosis not present

## 2019-08-06 DIAGNOSIS — M47812 Spondylosis without myelopathy or radiculopathy, cervical region: Secondary | ICD-10-CM | POA: Diagnosis not present

## 2019-08-28 DIAGNOSIS — M47812 Spondylosis without myelopathy or radiculopathy, cervical region: Secondary | ICD-10-CM | POA: Diagnosis not present

## 2019-09-10 ENCOUNTER — Other Ambulatory Visit: Payer: Self-pay | Admitting: Internal Medicine

## 2019-09-15 DIAGNOSIS — M47812 Spondylosis without myelopathy or radiculopathy, cervical region: Secondary | ICD-10-CM | POA: Diagnosis not present

## 2019-09-28 ENCOUNTER — Other Ambulatory Visit: Payer: Self-pay | Admitting: Internal Medicine

## 2019-10-01 ENCOUNTER — Encounter: Payer: Self-pay | Admitting: Gynecology

## 2019-10-08 ENCOUNTER — Other Ambulatory Visit: Payer: Self-pay | Admitting: Internal Medicine

## 2019-10-10 ENCOUNTER — Other Ambulatory Visit: Payer: Self-pay | Admitting: Internal Medicine

## 2019-10-21 DIAGNOSIS — M47812 Spondylosis without myelopathy or radiculopathy, cervical region: Secondary | ICD-10-CM | POA: Diagnosis not present

## 2019-11-27 DIAGNOSIS — M47812 Spondylosis without myelopathy or radiculopathy, cervical region: Secondary | ICD-10-CM | POA: Diagnosis not present

## 2019-11-27 DIAGNOSIS — I10 Essential (primary) hypertension: Secondary | ICD-10-CM | POA: Diagnosis not present

## 2019-11-27 DIAGNOSIS — Q761 Klippel-Feil syndrome: Secondary | ICD-10-CM | POA: Diagnosis not present

## 2019-12-18 ENCOUNTER — Other Ambulatory Visit: Payer: Self-pay | Admitting: Internal Medicine

## 2020-02-09 DIAGNOSIS — Z1231 Encounter for screening mammogram for malignant neoplasm of breast: Secondary | ICD-10-CM | POA: Diagnosis not present

## 2020-02-09 LAB — HM MAMMOGRAPHY

## 2020-02-14 ENCOUNTER — Ambulatory Visit: Payer: Medicare Other | Attending: Internal Medicine

## 2020-02-14 DIAGNOSIS — Z23 Encounter for immunization: Secondary | ICD-10-CM | POA: Insufficient documentation

## 2020-02-14 NOTE — Progress Notes (Signed)
   Covid-19 Vaccination Clinic  Name:  Chloe Young    MRN: [id number] DOB: 03/31/1951  02/14/2020  Chloe Young was observed post Covid-19 immunization for 15 minutes without incidence. She was provided with Vaccine Information Sheet and instruction to access the V-Safe system.   Chloe Young was instructed to call 911 with any severe reactions post vaccine: Marland Kitchen Difficulty breathing  . Swelling of your face and throat  . A fast heartbeat  . A bad rash all over your body  . Dizziness and weakness    Immunizations Administered    Name Date Dose VIS Date Route   Pfizer COVID-19 Vaccine 02/14/2020  9:36 AM 0.3 mL 12/05/2019 Intramuscular   Manufacturer: [place]   Lot: [id number]   [place]: [id number]

## 2020-03-02 DIAGNOSIS — M542 Cervicalgia: Secondary | ICD-10-CM | POA: Diagnosis not present

## 2020-03-02 DIAGNOSIS — Q761 Klippel-Feil syndrome: Secondary | ICD-10-CM | POA: Diagnosis not present

## 2020-03-08 ENCOUNTER — Other Ambulatory Visit: Payer: Self-pay | Admitting: Internal Medicine

## 2020-03-09 ENCOUNTER — Ambulatory Visit: Payer: Medicare Other | Attending: Internal Medicine

## 2020-03-09 DIAGNOSIS — Z23 Encounter for immunization: Secondary | ICD-10-CM

## 2020-03-09 NOTE — Progress Notes (Signed)
Subjective:    Patient ID: Chloe Young, female    DOB: 05-26-1951, 69 y.o.   MRN: [id number]  HPI She is here for a physical exam.   She has chronic nasal congestion and feels it is likely related to her husband smoking.  She inhales secondhand smoke all day and all night.  He does smoke in the house and she does not think that he will stop doing it.  He states that he wants to quit, but she is unsure if he truly needs it.  She has used Flonase nasal spray and it does not seem to help.  Medications and allergies reviewed with patient and updated if appropriate.  Patient Active Problem List   Diagnosis Date Noted  . Chronic nasal congestion 03/10/2020  . Lumbar radiculopathy 06/15/2016  . Cervicogenic headache 06/15/2016  . Osteopenia 06/15/2016  . GERD (gastroesophageal reflux disease) 06/15/2016  . Fasting hyperglycemia 11/25/2013  . Nonspecific abnormal electrocardiogram (ECG) (EKG) 05/28/2012  . Cervical post-laminectomy syndrome 03/18/2012  . Osteoarthritis of both knees 03/18/2012  . RAYNAUD'S SYNDROME 10/11/2010  . NECK PAIN, CHRONIC 04/19/2010  . Hyperlipidemia 04/19/2009  . Essential hypertension 04/19/2009    Current Outpatient Medications on File Prior to Visit  Medication Sig Dispense Refill  . alendronate (FOSAMAX) 70 MG tablet TAKE 1 TABLET BY MOUTH  EVERY 7 DAYS TAKE WITH A  FULL GLASS OF WATER ON AN  EMPTY STOMACH 12 tablet 3  . Calcium Carbonate-Vitamin D (CALCIUM + D PO) Take 1 tablet by mouth daily.     . fluticasone (FLONASE) 50 MCG/ACT nasal spray USE 1 SPRAY IN BOTH  NOSTRILS TWICE DAILY 48 g 1  . hydrochlorothiazide (MICROZIDE) 12.5 MG capsule Take 1 capsule (12.5 mg total) by mouth daily. OFFICE VISIT IS DUE 90 capsule 0  . HYDROcodone-acetaminophen (NORCO) 7.5-325 MG per tablet Take 1 tablet by mouth every 6 (six) hours as needed.    Marland Kitchen LYRICA 150 MG capsule     . Meloxicam 5 MG CAPS Take by mouth.    . metoprolol tartrate (LOPRESSOR) 50 MG tablet  Take 0.5 tablets (25 mg total) by mouth 2 (two) times daily. OFFICE VISIT IS DUE 90 tablet 0  . Multiple Vitamin (MULITIVITAMIN WITH MINERALS) TABS Take 1 tablet by mouth daily.     Marland Kitchen omeprazole (PRILOSEC) 20 MG capsule TAKE 1 CAPSULE BY MOUTH  DAILY 90 capsule 1  . rosuvastatin (CRESTOR) 5 MG tablet Take 1 tablet (5 mg total) by mouth daily. OFFICE VISIT IS DUE 90 tablet 0  . topiramate (TOPAMAX) 100 MG tablet Take 2 tablets by mouth 2 (two) times daily.    Marland Kitchen triamcinolone cream (KENALOG) 0.5 % APPLY 1 APPLICATION  TOPICALLY 2 (TWO) TIMES  DAILY AS NEEDED. 30 g 0   No current facility-administered medications on file prior to visit.    Past Medical History:  Diagnosis Date  . Cervical facet syndrome   . Cervical post-laminectomy syndrome    Dr Tessa Lerner[facility]  . Cervicalgia   . Chronic pain syndrome   . Hyperlipidemia    joint pain with statin  . Hypertension   . Osteoarthritis, localized, knee    OA AND PAIN LEFT KNEE  -- S/P RIGHT  KNEE ARTHROPLASTY ON 82012--KNEE IS DOING WELL.  . Osteopenia 06/2016    T score -1.6 FRAX not calculated    Past Surgical History:  Procedure Laterality Date  . CERVIAL FUSION  2006   Dr Arnoldo Morale, [facility]  .  COLONOSCOPY  2011    Dr Fuller Plan  . ESI  09/03/13   L 5- S1 ; Dr Nelva Bush  . JOINT REPLACEMENT  AUG 2012    RIGHT KNEE ARTHROPLASTY  . KNEE ARTHROSCOPY     bilaterally; Dr Tonita Cong  . TOTAL KNEE ARTHROPLASTY  01/11/2012   Procedure: TOTAL KNEE ARTHROPLASTY;  Surgeon: Johnn Hai, MD;  Location: [facility];  Service: Orthopedics;  Laterality: Left;  General with Femoral nerve block    Social History   Socioeconomic History  . Marital status: Married    Spouse name: Not on file  . Number of children: 1  . Years of education: Not on file  . Highest education level: Not on file  Occupational History  . Occupation: HAIRDRESSER    Employer: [employer]  Tobacco Use  . Smoking status: Never Smoker  . Smokeless tobacco: Never Used    Substance and Sexual Activity  . Alcohol use: Yes    Alcohol/week: 14.0 standard drinks    Types: 14 Shots of liquor per week    Comment: 2 DRINKS PER NIGHT  . Drug use: No  . Sexual activity: Yes    Birth control/protection: Post-menopausal  Other Topics Concern  . Not on file  Social History Narrative  . Not on file   Social Determinants of Health   Financial Resource Strain:   . Difficulty of Paying Living Expenses:   Food Insecurity:   . Worried About Charity fundraiser in the Last Year:   . Arboriculturist in the Last Year:   Transportation Needs:   . Film/video editor (Medical):   Marland Kitchen Lack of Transportation (Non-Medical):   Physical Activity:   . Days of Exercise per Week:   . Minutes of Exercise per Session:   Stress:   . Feeling of Stress :   Social Connections:   . Frequency of Communication with Friends and Family:   . Frequency of Social Gatherings with Friends and Family:   . Attends Religious Services:   . Active Member of Clubs or Organizations:   . Attends Archivist Meetings:   Marland Kitchen Marital Status:     Family History  Problem Relation Age of Onset  . Diverticulitis Mother 98       with fissures.Marland Kitchenopted for no surgery  . Cancer Father 35        malignant brain tumor  . Breast cancer Maternal Aunt   . Diabetes Maternal Grandfather   . Heart failure Maternal Grandfather   . Stroke Maternal Uncle        in 62s  . Heart disease Maternal Uncle        X4 ; 1 had MI @ 58  . Hypertension Sister     Review of Systems  Constitutional: Negative for chills and fever.  HENT: Positive for congestion.   Eyes: Negative for visual disturbance.  Respiratory: Negative for cough, shortness of breath and wheezing.   Cardiovascular: Negative for chest pain, palpitations and leg swelling.  Gastrointestinal: Negative for abdominal pain, blood in stool, constipation, diarrhea and nausea.  Genitourinary: Negative for dysuria and hematuria.   Musculoskeletal: Positive for neck pain (chronic). Negative for arthralgias and back pain (only when standing long periods).  Skin: Negative for color change.  Neurological: Positive for headaches (from neck). Negative for dizziness and light-headedness.  Psychiatric/Behavioral: Negative for dysphoric mood. The patient is not nervous/anxious.        Objective:   Vitals:  03/10/20 1026  BP: 134/80  Pulse: 68  Resp: 16  Temp: 98.6 F (37 C)  SpO2: 98%   Filed Weights   03/10/20 1026  Weight: 145 lb (65.8 kg)   Body mass index is 25.69 kg/m.  BP Readings from Last 3 Encounters:  03/10/20 134/80  08/08/18 108/72  11/16/17 136/78    Wt Readings from Last 3 Encounters:  03/10/20 145 lb (65.8 kg)  08/08/18 146 lb (66.2 kg)  11/16/17 149 lb (67.6 kg)     Physical Exam Constitutional: She appears well-developed and well-nourished. No distress.  HENT:  Head: Normocephalic and atraumatic.  Right Ear: External ear normal. Normal ear canal and TM Left Ear: External ear normal.  Normal ear canal and TM Mouth/Throat: Oropharynx is clear and moist.  Eyes: Conjunctivae and EOM are normal.  Neck: Neck supple. No tracheal deviation present. No thyromegaly present.  No carotid bruit  Cardiovascular: Normal rate, regular rhythm and normal heart sounds.   No murmur heard.  No edema. Pulmonary/Chest: Effort normal and breath sounds normal. No respiratory distress. She has no wheezes. She has no rales.  Breast: deferred   Abdominal: Soft. She exhibits no distension. There is no tenderness.  Lymphadenopathy: She has no cervical adenopathy.  Skin: Skin is warm and dry. She is not diaphoretic.  Psychiatric: She has a normal mood and affect. Her behavior is normal.        Assessment & Plan:   Physical exam: Screening blood work    ordered Immunizations  Td deferred, flu deferred, others up to date.  Discussed getting tetanus at pharmacy Colonoscopy  Due   05/2020 Mammogram  Up  to date Gyn  No longer seeing Dexa   Due - 06/2020  -- ordered Eye exams  Up to date  Exercise   Walking regularly Weight   BMI  Good for age Substance abuse   1-2 drinks a night Recommend derm appt for complete skin check   See Problem List for Assessment and Plan of chronic medical problems.    This visit occurred during the SARS-CoV-2 public health emergency.  Safety protocols were in place, including screening questions prior to the visit, additional usage of staff PPE, and extensive cleaning of exam room while observing appropriate contact time as indicated for disinfecting solutions.

## 2020-03-09 NOTE — Progress Notes (Signed)
   Covid-19 Vaccination Clinic  Name:  DALIZA YASUDA    MRN: [id number] DOB: [birth year]  03/09/2020  Ms. Mcdivitt was observed post Covid-19 immunization for 15 minutes without incident. She was provided with Vaccine Information Sheet and instruction to access the V-Safe system.   Ms. Olbrich was instructed to call 911 with any severe reactions post vaccine: Marland Kitchen Difficulty breathing  . Swelling of face and throat  . A fast heartbeat  . A bad rash all over body  . Dizziness and weakness   Immunizations Administered    Name Date Dose VIS Date Route   Pfizer COVID-19 Vaccine 03/09/2020 10:19 AM 0.3 mL 12/05/2019 Intramuscular   Manufacturer: [place]   Lot: [id number]   [place]: [id number]

## 2020-03-09 NOTE — Patient Instructions (Addendum)
Blood work was ordered.    All other Health Maintenance issues reviewed.   All recommended immunizations and age-appropriate screenings are up-to-date or discussed.  No immunization administered today.   Medications reviewed and updated.  Changes include :   Try Dymista nasal spray  Your prescription(s) have been submitted to your pharmacy. Please take as directed and contact our office if you believe you are having problem(s) with the medication(s).  A bone density was ordered - this is due in July 2021.   Please followup in 6 months    Health Maintenance, Female Adopting a healthy lifestyle and getting preventive care are important in promoting health and wellness. Ask your health care provider about:  The right schedule for you to have regular tests and exams.  Things you can do on your own to prevent diseases and keep yourself healthy. What should I know about diet, weight, and exercise? Eat a healthy diet   Eat a diet that includes plenty of vegetables, fruits, low-fat dairy products, and lean protein.  Do not eat a lot of foods that are high in solid fats, added sugars, or sodium. Maintain a healthy weight Body mass index (BMI) is used to identify weight problems. It estimates body fat based on height and weight. Your health care provider can help determine your BMI and help you achieve or maintain a healthy weight. Get regular exercise Get regular exercise. This is one of the most important things you can do for your health. Most adults should:  Exercise for at least 150 minutes each week. The exercise should increase your heart rate and make you sweat (moderate-intensity exercise).  Do strengthening exercises at least twice a week. This is in addition to the moderate-intensity exercise.  Spend less time sitting. Even light physical activity can be beneficial. Watch cholesterol and blood lipids Have your blood tested for lipids and cholesterol at 70 years of age,  then have this test every 5 years. Have your cholesterol levels checked more often if:  Your lipid or cholesterol levels are high.  You are older than 70 years of age.  You are at high risk for heart disease. What should I know about cancer screening? Depending on your health history and family history, you may need to have cancer screening at various ages. This may include screening for:  Breast cancer.  Cervical cancer.  Colorectal cancer.  Skin cancer.  Lung cancer. What should I know about heart disease, diabetes, and high blood pressure? Blood pressure and heart disease  High blood pressure causes heart disease and increases the risk of stroke. This is more likely to develop in people who have high blood pressure readings, are of African descent, or are overweight.  Have your blood pressure checked: ? Every 3-5 years if you are 18-23 years of age. ? Every year if you are 89 years old or older. Diabetes Have regular diabetes screenings. This checks your fasting blood sugar level. Have the screening done:  Once every three years after age 43 if you are at a normal weight and have a low risk for diabetes.  More often and at a younger age if you are overweight or have a high risk for diabetes. What should I know about preventing infection? Hepatitis B If you have a higher risk for hepatitis B, you should be screened for this virus. Talk with your health care provider to find out if you are at risk for hepatitis B infection. Hepatitis C Testing is recommended for:  Everyone born from [birth year] through 1966.  Anyone with known risk factors for hepatitis C. Sexually transmitted infections (STIs)  Get screened for STIs, including gonorrhea and chlamydia, if: ? You are sexually active and are younger than 70 years of age. ? You are older than 70 years of age and your health care provider tells you that you are at risk for this type of infection. ? Your sexual activity has  changed since you were last screened, and you are at increased risk for chlamydia or gonorrhea. Ask your health care provider if you are at risk.  Ask your health care provider about whether you are at high risk for HIV. Your health care provider may recommend a prescription medicine to help prevent HIV infection. If you choose to take medicine to prevent HIV, you should first get tested for HIV. You should then be tested every 3 months for as long as you are taking the medicine. Pregnancy  If you are about to stop having your period (premenopausal) and you may become pregnant, seek counseling before you get pregnant.  Take 400 to 800 micrograms (mcg) of folic acid every day if you become pregnant.  Ask for birth control (contraception) if you want to prevent pregnancy. Osteoporosis and menopause Osteoporosis is a disease in which the bones lose minerals and strength with aging. This can result in bone fractures. If you are 88 years old or older, or if you are at risk for osteoporosis and fractures, ask your health care provider if you should:  Be screened for bone loss.  Take a calcium or vitamin D supplement to lower your risk of fractures.  Be given hormone replacement therapy (HRT) to treat symptoms of menopause. Follow these instructions at home: Lifestyle  Do not use any products that contain nicotine or tobacco, such as cigarettes, e-cigarettes, and chewing tobacco. If you need help quitting, ask your health care provider.  Do not use street drugs.  Do not share needles.  Ask your health care provider for help if you need support or information about quitting drugs. Alcohol use  Do not drink alcohol if: ? Your health care provider tells you not to drink. ? You are pregnant, may be pregnant, or are planning to become pregnant.  If you drink alcohol: ? Limit how much you use to 0-1 drink a day. ? Limit intake if you are breastfeeding.  Be aware of how much alcohol is in  your drink. In the [place] one drink equals one 12 oz bottle of beer (355 mL), one 5 oz glass of wine (148 mL), or one 1 oz glass of hard liquor (44 mL). General instructions  Schedule regular health, dental, and eye exams.  Stay current with your vaccines.  Tell your health care provider if: ? You often feel depressed. ? You have ever been abused or do not feel safe at home. Summary  Adopting a healthy lifestyle and getting preventive care are important in promoting health and wellness.  Follow your health care provider's instructions about healthy diet, exercising, and getting tested or screened for diseases.  Follow your health care provider's instructions on monitoring your cholesterol and blood pressure. This information is not intended to replace advice given to you by your health care provider. Make sure you discuss any questions you have with your health care provider. Document Revised: 12/04/2018 Document Reviewed: 12/04/2018 Elsevier Patient Education  2020 [employer].

## 2020-03-10 ENCOUNTER — Encounter: Payer: Self-pay | Admitting: Internal Medicine

## 2020-03-10 ENCOUNTER — Other Ambulatory Visit: Payer: Self-pay

## 2020-03-10 ENCOUNTER — Telehealth: Payer: Self-pay | Admitting: Internal Medicine

## 2020-03-10 ENCOUNTER — Ambulatory Visit (INDEPENDENT_AMBULATORY_CARE_PROVIDER_SITE_OTHER): Payer: Medicare Other | Admitting: Internal Medicine

## 2020-03-10 VITALS — BP 134/80 | HR 68 | Temp 98.6°F | Resp 16 | Ht 63.0 in | Wt 145.0 lb

## 2020-03-10 DIAGNOSIS — I10 Essential (primary) hypertension: Secondary | ICD-10-CM

## 2020-03-10 DIAGNOSIS — R0981 Nasal congestion: Secondary | ICD-10-CM | POA: Insufficient documentation

## 2020-03-10 DIAGNOSIS — K219 Gastro-esophageal reflux disease without esophagitis: Secondary | ICD-10-CM | POA: Diagnosis not present

## 2020-03-10 DIAGNOSIS — M85851 Other specified disorders of bone density and structure, right thigh: Secondary | ICD-10-CM

## 2020-03-10 DIAGNOSIS — E782 Mixed hyperlipidemia: Secondary | ICD-10-CM

## 2020-03-10 DIAGNOSIS — Z0000 Encounter for general adult medical examination without abnormal findings: Secondary | ICD-10-CM

## 2020-03-10 DIAGNOSIS — G4486 Cervicogenic headache: Secondary | ICD-10-CM

## 2020-03-10 DIAGNOSIS — R739 Hyperglycemia, unspecified: Secondary | ICD-10-CM | POA: Diagnosis not present

## 2020-03-10 DIAGNOSIS — M85852 Other specified disorders of bone density and structure, left thigh: Secondary | ICD-10-CM

## 2020-03-10 DIAGNOSIS — R7301 Impaired fasting glucose: Secondary | ICD-10-CM

## 2020-03-10 DIAGNOSIS — R519 Headache, unspecified: Secondary | ICD-10-CM

## 2020-03-10 LAB — CBC WITH DIFFERENTIAL/PLATELET
Basophils Absolute: 0.1 10*3/uL (ref 0.0–0.1)
Basophils Relative: 1.2 % (ref 0.0–3.0)
Eosinophils Absolute: 0.2 10*3/uL (ref 0.0–0.7)
Eosinophils Relative: 3.3 % (ref 0.0–5.0)
HCT: 36.9 % (ref 36.0–46.0)
Hemoglobin: 12.3 g/dL (ref 12.0–15.0)
Lymphocytes Relative: 31.3 % (ref 12.0–46.0)
Lymphs Abs: 1.5 10*3/uL (ref 0.7–4.0)
MCHC: 33.4 g/dL (ref 30.0–36.0)
MCV: 95.3 fl (ref 78.0–100.0)
Monocytes Absolute: 0.5 10*3/uL (ref 0.1–1.0)
Monocytes Relative: 10.2 % (ref 3.0–12.0)
Neutro Abs: 2.6 10*3/uL (ref 1.4–7.7)
Neutrophils Relative %: 54 % (ref 43.0–77.0)
Platelets: 217 10*3/uL (ref 150.0–400.0)
RBC: 3.88 Mil/uL (ref 3.87–5.11)
RDW: 13.8 % (ref 11.5–15.5)
WBC: 4.9 10*3/uL (ref 4.0–10.5)

## 2020-03-10 LAB — LIPID PANEL
Cholesterol: 180 mg/dL (ref 0–200)
HDL: 76 mg/dL (ref >39.00)
LDL Cholesterol: 93 mg/dL (ref 0–99)
NonHDL: 104.05
Total CHOL/HDL Ratio: 2
Triglycerides: 56 mg/dL (ref 0.0–149.0)
VLDL: 11.2 mg/dL (ref 0.0–40.0)

## 2020-03-10 LAB — COMPREHENSIVE METABOLIC PANEL
ALT: 30 U/L (ref 0–35)
AST: 30 U/L (ref 0–37)
Albumin: 4.1 g/dL (ref 3.5–5.2)
Alkaline Phosphatase: 41 U/L (ref 39–117)
BUN: 18 mg/dL (ref 6–23)
CO2: 28 mEq/L (ref 19–32)
Calcium: 9.3 mg/dL (ref 8.4–10.5)
Chloride: 105 mEq/L (ref 96–112)
Creatinine, Ser: 0.67 mg/dL (ref 0.40–1.20)
GFR: 87.06 mL/min (ref >60.00)
Glucose, Bld: 90 mg/dL (ref 70–99)
Potassium: 4.2 mEq/L (ref 3.5–5.1)
Sodium: 139 mEq/L (ref 135–145)
Total Bilirubin: 0.4 mg/dL (ref 0.2–1.2)
Total Protein: 7 g/dL (ref 6.0–8.3)

## 2020-03-10 LAB — VITAMIN D 25 HYDROXY (VIT D DEFICIENCY, FRACTURES): VITD: 36.64 ng/mL (ref 30.00–100.00)

## 2020-03-10 LAB — TSH: TSH: 1.03 u[IU]/mL (ref 0.35–4.50)

## 2020-03-10 LAB — HEMOGLOBIN A1C: Hgb A1c MFr Bld: 5.4 % (ref 4.6–6.5)

## 2020-03-10 MED ORDER — AZELASTINE-FLUTICASONE 137-50 MCG/ACT NA SUSP: 1.0000 | Freq: Two times a day (BID) | NASAL | 5 refills | Status: DC

## 2020-03-10 MED ORDER — TRIAMCINOLONE ACETONIDE 55 MCG/ACT NA AERO: 2.0000 | INHALATION_SPRAY | Freq: Every day | NASAL | 12 refills | Status: DC

## 2020-03-10 NOTE — Telephone Encounter (Signed)
nasacort sent to pof

## 2020-03-10 NOTE — Assessment & Plan Note (Addendum)
Likely secondary to second hand smoke Trial of dymista-too expensive and will try Nasacort Taking allegra, mucinex Discussed encouraging him to quit, which would be the most helpful Also discussed air purifier's

## 2020-03-10 NOTE — Telephone Encounter (Signed)
Pt aware.

## 2020-03-10 NOTE — Assessment & Plan Note (Addendum)
Chronic Osteopenia with high frax on fosamax dexa due - ordered Walking regularly Taking calcium and vitamin d Check vitamin D level Continue

## 2020-03-10 NOTE — Assessment & Plan Note (Signed)
Chronic Check lipid panel  Continue daily statin Regular exercise and healthy diet encouraged  

## 2020-03-10 NOTE — Assessment & Plan Note (Signed)
Chronic Check a1c Low sugar / carb diet Stressed regular exercise  

## 2020-03-10 NOTE — Assessment & Plan Note (Signed)
Chronic Follows with management Taking topamax, mobic, lyrica and rarely vicodin

## 2020-03-10 NOTE — Assessment & Plan Note (Signed)
Chronic GERD controlled Continue daily medication - prilosec 20 mg

## 2020-03-10 NOTE — Telephone Encounter (Signed)
New message:   Pt states nasal spray was prescribed to her today and it would cost her $160 today and $120 monthly after that. She states is there something cheaper that can be called in for her. Please advise.

## 2020-03-10 NOTE — Assessment & Plan Note (Signed)
Chronic BP well controlled Current regimen effective and well tolerated Continue current medications at current doses cmp  

## 2020-03-11 ENCOUNTER — Encounter: Payer: Self-pay | Admitting: Internal Medicine

## 2020-03-18 ENCOUNTER — Encounter: Payer: Self-pay | Admitting: Internal Medicine

## 2020-03-31 ENCOUNTER — Encounter: Payer: Self-pay | Admitting: Internal Medicine

## 2020-03-31 DIAGNOSIS — M8589 Other specified disorders of bone density and structure, multiple sites: Secondary | ICD-10-CM | POA: Diagnosis not present

## 2020-04-08 ENCOUNTER — Encounter: Payer: Self-pay | Admitting: Internal Medicine

## 2020-04-09 NOTE — Telephone Encounter (Signed)
° ° °  Patient called to discuss results of bone density and labs. She is not on MyChart

## 2020-04-09 NOTE — Telephone Encounter (Signed)
LVM to call back in regards. 

## 2020-04-19 ENCOUNTER — Telehealth: Payer: Self-pay | Admitting: Internal Medicine

## 2020-04-19 DIAGNOSIS — I10 Essential (primary) hypertension: Secondary | ICD-10-CM

## 2020-04-19 NOTE — Chronic Care Management (AMB) (Signed)
  Chronic Care Management   Note  04/19/2020 Name: Chloe Young MRN: [id number] DOB: [birth year]  Chloe Young is a 69 y.o. year old female who is a primary care patient of Quay Burow, Claudina Lick, MD. I reached out to [name] by phone today in response to a referral sent by [name] PCP, Binnie Rail, MD.   [name] was given information about Chronic Care Management services today including:  1. CCM service includes personalized support from designated clinical staff supervised by her physician, including individualized plan of care and coordination with other care providers 2. 24/7 contact phone numbers for assistance for urgent and routine care needs. 3. Service will only be billed when office clinical staff spend 20 minutes or more in a month to coordinate care. 4. Only one practitioner may furnish and bill the service in a calendar month. 5. The patient may stop CCM services at any time (effective at the end of the month) by phone call to the office staff.   Patient agreed to services and verbal consent obtained.   This note is not being shared with the patient for the following reason: To respect privacy (The patient or proxy has requested that the information not be shared).  Follow up plan:   Raynicia Dukes [employer] Scheduler

## 2020-05-12 NOTE — Addendum Note (Signed)
Addended by: Aviva Signs M on: 05/12/2020 10:37 AM   Modules accepted: Orders

## 2020-05-13 ENCOUNTER — Ambulatory Visit: Payer: Medicare Other | Admitting: Pharmacist

## 2020-05-13 ENCOUNTER — Other Ambulatory Visit: Payer: Self-pay

## 2020-05-13 DIAGNOSIS — I10 Essential (primary) hypertension: Secondary | ICD-10-CM

## 2020-05-13 DIAGNOSIS — E782 Mixed hyperlipidemia: Secondary | ICD-10-CM

## 2020-05-13 DIAGNOSIS — M85851 Other specified disorders of bone density and structure, right thigh: Secondary | ICD-10-CM

## 2020-05-13 NOTE — Patient Instructions (Addendum)
Visit Information  Thank you for meeting with me to discuss your medications! I look forward to working with you to achieve your health care goals. Below is a summary of what we talked about during the visit:  Goals Addressed            This Visit's Progress   . Pharmacy Care Plan       CARE PLAN ENTRY  Current Barriers:  . Chronic Disease Management support, education, and care coordination needs related to Hypertension, Hyperlipidemia, and Osteopenia   Hypertension . Pharmacist Clinical Goal(s): o Over the next 180 days, patient will work with PharmD and providers to maintain BP goal <130/80 . Current regimen:  o Metoprolol 50 mg - 1/2 tablet BID o HCTZ 12.5 mg daily . Interventions: o Discussed BP goals and benefits of medications for preventing heart attack and stroke . Patient self care activities - Over the next 180 days, patient will: o Check BP as needed, document, and provide at future appointments o Ensure daily salt intake < 2300 mg/day  Hyperlipidemia . Pharmacist Clinical Goal(s): o Over the next 180 days, patient will work with PharmD and providers to maintain LDL goal < 100 . Current regimen:  o Rosuvastatin 5 mg daily . Interventions: o Discussed cholesterol goals and benefits of statins for cardiac protection . Patient self care activities - Over the next 90 days, patient will: o Continue medication as prescribed o Continue low cholesterol diet  Osteopenia . Pharmacist Clinical Goal(s) o Over the next 180 days, patient will work with PharmD and providers to optimize bone health . Current regimen:  o Alendronate 70 mg once weekly (started 07/2018) o Calcium-Vitamin D daily . Interventions: o Discussed recent bone scan results have improved since starting alendronate o Discussed administration of alendronate once weekly, 30 minutes before eating, with a full glass of water, separate from other medications . Patient self care activities - Over the next 180  days, patient will: o Continue medications as directed o Follow up every 2 years for DEXA scan  Medication management . Pharmacist Clinical Goal(s): o Over the next 180 days, patient will work with PharmD and providers to maintain optimal medication adherence . Current pharmacy: [facility] mail order, [place] . Interventions o Comprehensive medication review performed. o Continue current medication management strategy . Patient self care activities - Over the next 180 days, patient will: o Focus on medication adherence by pill count o Take medications as prescribed o Report any questions or concerns to PharmD and/or provider(s)  Initial goal documentation       Ms. Arensdorf was given information about Chronic Care Management services today including:  1. CCM service includes personalized support from designated clinical staff supervised by her physician, including individualized plan of care and coordination with other care providers 2. 24/7 contact phone numbers for assistance for urgent and routine care needs. 3. Standard insurance, coinsurance, copays and deductibles apply for chronic care management only during months in which we provide at least 20 minutes of these services. Most insurances cover these services at 100%, however patients may be responsible for any copay, coinsurance and/or deductible if applicable. This service may help you avoid the need for more expensive face-to-face services. 4. Only one practitioner may furnish and bill the service in a calendar month. 5. The patient may stop CCM services at any time (effective at the end of the month) by phone call to the office staff.  Patient agreed to services and verbal consent  obtained.   The patient verbalized understanding of instructions provided today and agreed to receive a mailed copy of patient instruction and/or educational materials. Telephone follow up appointment with pharmacy team member scheduled for:  6 months  Charlene Brooke, PharmD Clinical Pharmacist [facility] [place] protect organs, store calcium, anchor muscles, and support the whole body. Keeping your bones strong is important, especially as you get older. You can take actions to help keep your bones strong and healthy. Why is keeping my bones healthy important?  Keeping your bones healthy is important because your body constantly replaces bone cells. Cells get old, and new cells take their place. As we age, we lose bone cells because the body may not be able to make enough new cells to replace the old cells. The amount of bone cells and bone tissue you have is referred to as bone mass. The higher your bone mass, the stronger your bones. The aging process leads to an overall loss of bone mass in the body, which can increase the likelihood of:  Joint pain and stiffness.  Broken bones.  A condition in which the bones become weak and brittle (osteoporosis). A large decline in bone mass occurs in older adults. In women, it occurs about the time of menopause. What actions can I take to keep my bones healthy? Good health habits are important for maintaining healthy bones. This includes eating nutritious foods and exercising regularly. To have healthy bones, you need to get enough of the right minerals and vitamins. Most nutrition experts recommend getting these nutrients from the foods that you eat. In some cases, taking supplements may also be recommended. Doing certain types of exercise is also important for bone health. What are the nutritional recommendations for healthy bones?  Eating a well-balanced diet with plenty of calcium and vitamin D will help to protect your bones. Nutritional recommendations vary from person to person. Ask your health care provider what is healthy for you. Here are some general guidelines. Get enough calcium Calcium is the most important (essential)  mineral for bone health. Most people can get enough calcium from their diet, but supplements may be recommended for people who are at risk for osteoporosis. Good sources of calcium include:  Dairy products, such as low-fat or nonfat milk, cheese, and yogurt.  Dark green leafy vegetables, such as bok choy and broccoli.  Calcium-fortified foods, such as orange juice, cereal, bread, soy beverages, and tofu products.  Nuts, such as almonds. Follow these recommended amounts for daily calcium intake:  Children, age 35-3: 700 mg.  Children, age 24-8: 1,000 mg.  Children, age 354-13: 1,300 mg.  Teens, age 72-18: 1,300 mg.  Adults, age 31-50: 1,000 mg.  Adults, age 25-70: ? Men: 1,000 mg. ? Women: 1,200 mg.  Adults, age 69 or older: 1,200 mg.  Pregnant and breastfeeding females: ? Teens: 1,300 mg. ? Adults: 1,000 mg. Get enough vitamin D Vitamin D is the most essential vitamin for bone health. It helps the body absorb calcium. Sunlight stimulates the skin to make vitamin D, so be sure to get enough sunlight. If you live in a cold climate or you do not get outside often, your health care provider may recommend that you take vitamin D supplements. Good sources of vitamin D in your diet include:  Egg yolks.  Saltwater fish.  Milk and cereal fortified with vitamin D. Follow these recommended amounts for daily vitamin D intake:  Children and teens,  age 17-18: 600 international units.  Adults, age 86 or younger: 400-800 international units.  Adults, age 90 or older: 800-1,000 international units. Get other important nutrients Other nutrients that are important for bone health include:  Phosphorus. This mineral is found in meat, poultry, dairy foods, nuts, and legumes. The recommended daily intake for adult men and adult women is 700 mg.  Magnesium. This mineral is found in seeds, nuts, dark green vegetables, and legumes. The recommended daily intake for adult men is 400-420 mg. For  adult women, it is 310-320 mg.  Vitamin K. This vitamin is found in green leafy vegetables. The recommended daily intake is 120 mg for adult men and 90 mg for adult women. What type of physical activity is best for building and maintaining healthy bones? Weight-bearing and strength-building activities are important for building and maintaining healthy bones. Weight-bearing activities cause muscles and bones to work against gravity. Strength-building activities increase the strength of the muscles that support bones. Weight-bearing and muscle-building activities include:  Walking and hiking.  Jogging and running.  Dancing.  Gym exercises.  Lifting weights.  Tennis and racquetball.  Climbing stairs.  Aerobics. Adults should get at least 30 minutes of moderate physical activity on most days. Children should get at least 60 minutes of moderate physical activity on most days. Ask your health care provider what type of exercise is best for you. How can I find out if my bone mass is low? Bone mass can be measured with an X-ray test called a bone mineral density (BMD) test. This test is recommended for all women who are age 40 or older. It may also be recommended for:  Men who are age 60 or older.  People who are at risk for osteoporosis because of: ? Having bones that break easily. ? Having a long-term disease that weakens bones, such as kidney disease or rheumatoid arthritis. ? Having menopause earlier than normal. ? Taking medicine that weakens bones, such as steroids, thyroid hormones, or hormone treatment for breast cancer or prostate cancer. ? Smoking. ? Drinking three or more alcoholic drinks a day. If you find that you have a low bone mass, you may be able to prevent osteoporosis or further bone loss by changing your diet and lifestyle. Where can I find more information? For more information, check out the following websites:  [place]:  AviationTales.fr  [employer] of Health: www.bones.SouthExposed.es  International Osteoporosis Foundation: Administrator.iofbonehealth.org Summary  The aging process leads to an overall loss of bone mass in the body, which can increase the likelihood of broken bones and osteoporosis.  Eating a well-balanced diet with plenty of calcium and vitamin D will help to protect your bones.  Weight-bearing and strength-building activities are also important for building and maintaining strong bones.  Bone mass can be measured with an X-ray test called a bone mineral density (BMD) test. This information is not intended to replace advice given to you by your health care provider. Make sure you discuss any questions you have with your health care provider. Document Revised: 01/07/2018 Document Reviewed: 01/07/2018 Elsevier Patient Education  2020 [employer].

## 2020-05-13 NOTE — Chronic Care Management (AMB) (Signed)
Chronic Care Management Pharmacy  Name: Chloe Young  MRN: [id number] DOB: 01/14/1951  Chief Complaint/ HPI  Chloe Young,  69 y.o. , female presents for their Initial CCM visit with the clinical pharmacist via telephone due to COVID-19 Pandemic.  PCP : Binnie Rail, MD  Their chronic conditions include: HTN, HLD, GERD, osteopenia, chronic neck pain, allergies  Son - [employer] in [place]. Lives with husband and 35-month old puppy.  Husband is Company secretary in [place], otherwise both are retired and very much enjoying retired life. She was a Theme park manager.   Pt likes to walk 1.5 miles daily with dog and reads a lot.    Office Visits: 03/10/20 Dr Quay Burow OV: chronic nasal congestion 2/2 secondhand smoke - try Nasacort. Repeat DEXA ordered for July 2021.  Consult Visit: n/a  No Known Allergies  Medications: Outpatient Encounter Medications as of 05/13/2020  Medication Sig Note  . alendronate (FOSAMAX) 70 MG tablet TAKE 1 TABLET BY MOUTH  EVERY 7 DAYS TAKE WITH A  FULL GLASS OF WATER ON AN  EMPTY STOMACH   . Calcium Carbonate-Vitamin D (CALCIUM + D PO) Take 1 tablet by mouth daily.  07/25/2016: Takes 2  . hydrochlorothiazide (MICROZIDE) 12.5 MG capsule TAKE 1 CAPSULE BY MOUTH  DAILY   . HYDROcodone-acetaminophen (NORCO) 7.5-325 MG per tablet Take 1 tablet by mouth every 6 (six) hours as needed. 11/25/2013: Dr Lovenia Shuck  . LYRICA 150 MG capsule Take 150 mg by mouth 2 (two) times daily.  06/15/2016: Received from: External Pharmacy  . meloxicam (MOBIC) 15 MG tablet Take 1 tablet by mouth daily.    . metoprolol tartrate (LOPRESSOR) 50 MG tablet TAKE ONE-HALF TABLET BY  MOUTH TWICE DAILY   . Multiple Vitamin (MULITIVITAMIN WITH MINERALS) TABS Take 1 tablet by mouth daily.    Marland Kitchen omeprazole (PRILOSEC) 20 MG capsule TAKE 1 CAPSULE BY MOUTH  DAILY   . rosuvastatin (CRESTOR) 5 MG tablet TAKE 1 TABLET BY MOUTH  DAILY   . topiramate (TOPAMAX) 100 MG tablet Take 2 tablets by mouth 2 (two) times daily.  06/15/2016: Received from: External Pharmacy  . triamcinolone (NASACORT) 55 MCG/ACT AERO nasal inhaler Place 2 sprays into the nose daily.   . fluticasone (FLONASE) 50 MCG/ACT nasal spray USE 1 SPRAY IN BOTH  NOSTRILS TWICE DAILY (Patient not taking: Reported on 05/13/2020)   . triamcinolone cream (KENALOG) 0.5 % APPLY 1 APPLICATION  TOPICALLY 2 (TWO) TIMES  DAILY AS NEEDED. (Patient not taking: Reported on 05/13/2020)    No facility-administered encounter medications on file as of 05/13/2020.     Current Diagnosis/Assessment:  SDOH Interventions     Most Recent Value  SDOH Interventions  SDOH Interventions for the Following Domains  Financial Strain  Financial Strain Interventions  Other (Comment) [no problems affording medications]     Goals Addressed            This Visit's Progress   . Pharmacy Care Plan       CARE PLAN ENTRY  Current Barriers:  . Chronic Disease Management support, education, and care coordination needs related to Hypertension, Hyperlipidemia, and Osteopenia   Hypertension . Pharmacist Clinical Goal(s): o Over the next 180 days, patient will work with PharmD and providers to maintain BP goal <130/80 . Current regimen:  o Metoprolol 50 mg - 1/2 tablet BID o HCTZ 12.5 mg daily . Interventions: o Discussed BP goals and benefits of medications for preventing heart attack and stroke . Patient self care activities -  Over the next 180 days, patient will: o Check BP as needed, document, and provide at future appointments o Ensure daily salt intake < 2300 mg/day  Hyperlipidemia . Pharmacist Clinical Goal(s): o Over the next 180 days, patient will work with PharmD and providers to maintain LDL goal < 100 . Current regimen:  o Rosuvastatin 5 mg daily . Interventions: o Discussed cholesterol goals and benefits of statins for cardiac protection . Patient self care activities - Over the next 90 days, patient will: o Continue medication as prescribed o Continue  low cholesterol diet  Osteopenia . Pharmacist Clinical Goal(s) o Over the next 180 days, patient will work with PharmD and providers to optimize bone health . Current regimen:  o Alendronate 70 mg once weekly (started 07/2018) o Calcium-Vitamin D daily . Interventions: o Discussed recent bone scan results have improved since starting alendronate o Discussed administration of alendronate once weekly, 30 minutes before eating, with a full glass of water, separate from other medications . Patient self care activities - Over the next 180 days, patient will: o Continue medications as directed o Follow up every 2 years for DEXA scan  Medication management . Pharmacist Clinical Goal(s): o Over the next 180 days, patient will work with PharmD and providers to maintain optimal medication adherence . Current pharmacy: [facility] mail order, [place] . Interventions o Comprehensive medication review performed. o Continue current medication management strategy . Patient self care activities - Over the next 180 days, patient will: o Focus on medication adherence by pill count o Take medications as prescribed o Report any questions or concerns to PharmD and/or provider(s)  Initial goal documentation       Hypertension   BP goal today is:  <130/80  Office blood pressures are  BP Readings from Last 3 Encounters:  03/10/20 134/80  08/08/18 108/72  11/16/17 136/78   Patient has failed these meds in the past: n/a Patient is currently controlled on the following medications:   Metoprolol 50 mg - 1/2 tablet BID  HCTZ 12.5 mg daily  Patient checks BP at home infrequently  Patient home BP readings are ranging: n/a  We discussed diet and exercise extensively; pt uses pill cutter to half metoprolol  Plan  Continue current medications and control with diet and exercise     Hyperlipidemia   Lipid Panel     Component Value Date/Time   CHOL 180 03/10/2020 1303   TRIG 56.0  03/10/2020 1303   HDL 76.00 03/10/2020 1303   CHOLHDL 2 03/10/2020 1303   VLDL 11.2 03/10/2020 1303   [place] 93 03/10/2020 1303    The 10-year ASCVD risk score Mikey Bussing DC Jr., et al., 2013) is: 12.5%   Values used to calculate the score:     Age: 62 years     Sex: Female     Is Non-Hispanic African American: No     Diabetic: No     Tobacco smoker: No     Systolic Blood Pressure: [id number] mmHg     Is BP treated: Yes     HDL Cholesterol: 76 mg/dL     Total Cholesterol: 180 mg/dL   Patient has failed these meds in past: n/a Patient is currently controlled on the following medications:   Rosuvastatin 5 mg daily  We discussed:  diet and exercise extensively; benefits of statin for ASCVD prevention  Plan  Continue current medications and control with diet and exercise   Osteopenia   Last DEXA Scan: 07/04/2018  T-Score  femoral neck: -2.0  T-Score total hip: -2.1  T-Score lumbar spine: -0.8  10-year probability of major osteoporotic fracture: 18%  10-year probability of hip fracture: 2.9%  VITD  Date Value Ref Range Status  03/10/2020 36.64 30.00 - 100.00 ng/mL Final    Patient is not a candidate for pharmacologic treatment; however FRAX is borderline so pt wanted to start treatment.  Patient has failed these meds in past: n/a Patient is currently controlled on the following medications:   Alendronate 70 mg once weekly (started 07/2018)  Calcium-Vitamin D daily 600 - 800 1-2 times daily  We discussed:  Counseled on oral bisphosphonate administration: take in the morning, 30 minutes prior to food with 6-8 oz of water. Do not lie down for at least 30 minutes after taking. Recommend weight-bearing and muscle strengthening exercises for building and maintaining bone density.  -pt reports she had repeat DEXA within the last 2 months and T-score improved since starting alendronate.   Plan  Continue current medications  Repeat DEXA in 2 years  GERD   Patient has failed these  meds in past: famotidine Patient is currently controlled on the following medications:   Omeprazole 20 mg daily  We discussed:  Pt switched to famotidine due to bone effects with PPI, however did not treat reflux well so pt switched back to PPI.  Plan  Continue current medications  Neck pain/Headache   Patient has failed these meds in past: n/a Patient is currently controlled on the following medications:   Hydrocodone/APAP 7.5-325 mg q6h PRN  Meloxicam 15 mg daily  Pregabalin 150 mg - 1 tab BID  Topiramate 100 mg - 1 tabs BID  We discussed:  Going to trial off meloxicam. Hydrocodone for severe pain, typically once a week. Pain is more controlled after nerve block Oct 2020.  Plan  Continue current medications  Allergies   Patient has failed these meds in past: n/a Patient is currently controlled on the following medications:   Triamcinolone nasal spray  Fexofenadine  Mucinex  We discussed:  Patient is satisfied with current OTC regimen and denies issues  Plan  Continue current medications   Medication Management   Pt uses [facility] mail order, [place] for all medications Uses pill box? No - organizes meds each morning Pt endorses 100% compliance  We discussed: all medications are covered by insurance, pt endorses compliance, denies issues with med organization.  Plan  Continue current medication management strategy    Follow up: 6 month phone visit  Charlene Brooke, PharmD Clinical Pharmacist [facility] at [facility] [phone]

## 2020-06-10 DIAGNOSIS — M47812 Spondylosis without myelopathy or radiculopathy, cervical region: Secondary | ICD-10-CM | POA: Diagnosis not present

## 2020-06-11 ENCOUNTER — Encounter: Payer: Self-pay | Admitting: Gastroenterology

## 2020-06-30 ENCOUNTER — Other Ambulatory Visit: Payer: Self-pay | Admitting: Internal Medicine

## 2020-07-22 DIAGNOSIS — M47812 Spondylosis without myelopathy or radiculopathy, cervical region: Secondary | ICD-10-CM | POA: Diagnosis not present

## 2020-07-22 DIAGNOSIS — Q761 Klippel-Feil syndrome: Secondary | ICD-10-CM | POA: Diagnosis not present

## 2020-08-03 ENCOUNTER — Other Ambulatory Visit: Payer: Self-pay | Admitting: Internal Medicine

## 2020-08-12 ENCOUNTER — Encounter: Payer: Medicare Other | Admitting: Gastroenterology

## 2020-09-08 ENCOUNTER — Other Ambulatory Visit: Payer: Self-pay

## 2020-09-08 ENCOUNTER — Ambulatory Visit (INDEPENDENT_AMBULATORY_CARE_PROVIDER_SITE_OTHER): Payer: Medicare Other | Admitting: *Deleted

## 2020-09-08 DIAGNOSIS — Z23 Encounter for immunization: Secondary | ICD-10-CM | POA: Diagnosis not present

## 2020-09-09 DIAGNOSIS — M47812 Spondylosis without myelopathy or radiculopathy, cervical region: Secondary | ICD-10-CM | POA: Diagnosis not present

## 2020-10-06 DIAGNOSIS — Q761 Klippel-Feil syndrome: Secondary | ICD-10-CM | POA: Diagnosis not present

## 2020-10-06 DIAGNOSIS — M542 Cervicalgia: Secondary | ICD-10-CM | POA: Diagnosis not present

## 2020-10-23 ENCOUNTER — Ambulatory Visit: Payer: Medicare Other | Attending: Internal Medicine

## 2020-10-23 DIAGNOSIS — Z23 Encounter for immunization: Secondary | ICD-10-CM

## 2020-10-23 NOTE — Progress Notes (Signed)
   Covid-19 Vaccination Clinic  Name:  Chloe Young    MRN: [id number] DOB: 1951-05-31  10/23/2020  [name] was observed post Covid-19 immunization for 15 minutes without incident. She was provided with Vaccine Information Sheet and instruction to access the V-Safe system.   [name] was instructed to call 911 with any severe reactions post vaccine: Marland Kitchen Difficulty breathing  . Swelling of face and throat  . A fast heartbeat  . A bad rash all over body  . Dizziness and weakness

## 2020-11-04 DIAGNOSIS — S42201A Unspecified fracture of upper end of right humerus, initial encounter for closed fracture: Secondary | ICD-10-CM | POA: Diagnosis not present

## 2020-11-08 ENCOUNTER — Emergency Department (HOSPITAL_COMMUNITY): Payer: Medicare Other | Admitting: Certified Registered Nurse Anesthetist

## 2020-11-08 ENCOUNTER — Encounter (HOSPITAL_COMMUNITY): Payer: Self-pay | Admitting: Pulmonary Disease

## 2020-11-08 ENCOUNTER — Emergency Department (HOSPITAL_COMMUNITY): Payer: Medicare Other

## 2020-11-08 ENCOUNTER — Inpatient Hospital Stay (HOSPITAL_COMMUNITY): Payer: Medicare Other

## 2020-11-08 ENCOUNTER — Other Ambulatory Visit: Payer: Self-pay

## 2020-11-08 ENCOUNTER — Encounter (HOSPITAL_COMMUNITY)
Admission: EM | Disposition: A | Discharge status: Skilled Nursing Facility | Payer: Self-pay | Source: Home / Self Care | Attending: Internal Medicine

## 2020-11-08 ENCOUNTER — Encounter (HOSPITAL_COMMUNITY): Payer: Self-pay

## 2020-11-08 ENCOUNTER — Inpatient Hospital Stay (HOSPITAL_COMMUNITY)
Admission: EM | Admit: 2020-11-08 | Discharge: 2020-11-23 | DRG: 853 | Disposition: A | Discharge status: Skilled Nursing Facility | Payer: Medicare Other | Attending: Internal Medicine | Admitting: Internal Medicine

## 2020-11-08 ENCOUNTER — Ambulatory Visit (HOSPITAL_COMMUNITY): Admit: 2020-11-08 | Payer: Medicare Other | Admitting: Internal Medicine

## 2020-11-08 DIAGNOSIS — Z96653 Presence of artificial knee joint, bilateral: Secondary | ICD-10-CM | POA: Diagnosis present

## 2020-11-08 DIAGNOSIS — R9431 Abnormal electrocardiogram [ECG] [EKG]: Secondary | ICD-10-CM

## 2020-11-08 DIAGNOSIS — R6520 Severe sepsis without septic shock: Secondary | ICD-10-CM

## 2020-11-08 DIAGNOSIS — K651 Peritoneal abscess: Secondary | ICD-10-CM | POA: Diagnosis not present

## 2020-11-08 DIAGNOSIS — I499 Cardiac arrhythmia, unspecified: Secondary | ICD-10-CM | POA: Diagnosis not present

## 2020-11-08 DIAGNOSIS — Z981 Arthrodesis status: Secondary | ICD-10-CM | POA: Diagnosis not present

## 2020-11-08 DIAGNOSIS — R079 Chest pain, unspecified: Secondary | ICD-10-CM | POA: Diagnosis not present

## 2020-11-08 DIAGNOSIS — Z7401 Bed confinement status: Secondary | ICD-10-CM | POA: Diagnosis not present

## 2020-11-08 DIAGNOSIS — Z20822 Contact with and (suspected) exposure to covid-19: Secondary | ICD-10-CM | POA: Diagnosis present

## 2020-11-08 DIAGNOSIS — R0981 Nasal congestion: Secondary | ICD-10-CM | POA: Diagnosis present

## 2020-11-08 DIAGNOSIS — M961 Postlaminectomy syndrome, not elsewhere classified: Secondary | ICD-10-CM | POA: Diagnosis present

## 2020-11-08 DIAGNOSIS — A419 Sepsis, unspecified organism: Principal | ICD-10-CM

## 2020-11-08 DIAGNOSIS — J9811 Atelectasis: Secondary | ICD-10-CM | POA: Diagnosis not present

## 2020-11-08 DIAGNOSIS — I10 Essential (primary) hypertension: Secondary | ICD-10-CM | POA: Diagnosis not present

## 2020-11-08 DIAGNOSIS — W08XXXA Fall from other furniture, initial encounter: Secondary | ICD-10-CM | POA: Diagnosis present

## 2020-11-08 DIAGNOSIS — K659 Peritonitis, unspecified: Secondary | ICD-10-CM

## 2020-11-08 DIAGNOSIS — I9581 Postprocedural hypotension: Secondary | ICD-10-CM | POA: Diagnosis not present

## 2020-11-08 DIAGNOSIS — S42231A 3-part fracture of surgical neck of right humerus, initial encounter for closed fracture: Secondary | ICD-10-CM

## 2020-11-08 DIAGNOSIS — K631 Perforation of intestine (nontraumatic): Secondary | ICD-10-CM | POA: Diagnosis not present

## 2020-11-08 DIAGNOSIS — R109 Unspecified abdominal pain: Secondary | ICD-10-CM | POA: Diagnosis not present

## 2020-11-08 DIAGNOSIS — E876 Hypokalemia: Secondary | ICD-10-CM | POA: Diagnosis not present

## 2020-11-08 DIAGNOSIS — K650 Generalized (acute) peritonitis: Secondary | ICD-10-CM | POA: Diagnosis not present

## 2020-11-08 DIAGNOSIS — E785 Hyperlipidemia, unspecified: Secondary | ICD-10-CM | POA: Diagnosis present

## 2020-11-08 DIAGNOSIS — Z7289 Other problems related to lifestyle: Secondary | ICD-10-CM

## 2020-11-08 DIAGNOSIS — Z7951 Long term (current) use of inhaled steroids: Secondary | ICD-10-CM

## 2020-11-08 DIAGNOSIS — S49091A Other physeal fracture of upper end of humerus, right arm, initial encounter for closed fracture: Secondary | ICD-10-CM | POA: Diagnosis present

## 2020-11-08 DIAGNOSIS — W19XXXA Unspecified fall, initial encounter: Secondary | ICD-10-CM | POA: Diagnosis not present

## 2020-11-08 DIAGNOSIS — G894 Chronic pain syndrome: Secondary | ICD-10-CM | POA: Diagnosis not present

## 2020-11-08 DIAGNOSIS — I7300 Raynaud's syndrome without gangrene: Secondary | ICD-10-CM | POA: Diagnosis present

## 2020-11-08 DIAGNOSIS — K5720 Diverticulitis of large intestine with perforation and abscess without bleeding: Secondary | ICD-10-CM | POA: Diagnosis not present

## 2020-11-08 DIAGNOSIS — M2550 Pain in unspecified joint: Secondary | ICD-10-CM | POA: Diagnosis not present

## 2020-11-08 DIAGNOSIS — S42291A Other displaced fracture of upper end of right humerus, initial encounter for closed fracture: Secondary | ICD-10-CM | POA: Diagnosis not present

## 2020-11-08 DIAGNOSIS — Z7983 Long term (current) use of bisphosphonates: Secondary | ICD-10-CM

## 2020-11-08 DIAGNOSIS — K567 Ileus, unspecified: Secondary | ICD-10-CM | POA: Diagnosis not present

## 2020-11-08 DIAGNOSIS — K56600 Partial intestinal obstruction, unspecified as to cause: Secondary | ICD-10-CM

## 2020-11-08 DIAGNOSIS — R198 Other specified symptoms and signs involving the digestive system and abdomen: Secondary | ICD-10-CM | POA: Diagnosis not present

## 2020-11-08 DIAGNOSIS — K9189 Other postprocedural complications and disorders of digestive system: Secondary | ICD-10-CM | POA: Diagnosis not present

## 2020-11-08 DIAGNOSIS — K219 Gastro-esophageal reflux disease without esophagitis: Secondary | ICD-10-CM | POA: Diagnosis not present

## 2020-11-08 DIAGNOSIS — R110 Nausea: Secondary | ICD-10-CM | POA: Diagnosis not present

## 2020-11-08 DIAGNOSIS — R5381 Other malaise: Secondary | ICD-10-CM | POA: Diagnosis not present

## 2020-11-08 DIAGNOSIS — S4981XA Other specified injuries of right shoulder and upper arm, initial encounter: Secondary | ICD-10-CM | POA: Diagnosis not present

## 2020-11-08 DIAGNOSIS — R000 Tachycardia, unspecified: Secondary | ICD-10-CM | POA: Diagnosis not present

## 2020-11-08 DIAGNOSIS — M6281 Muscle weakness (generalized): Secondary | ICD-10-CM | POA: Diagnosis not present

## 2020-11-08 DIAGNOSIS — K6389 Other specified diseases of intestine: Secondary | ICD-10-CM | POA: Diagnosis not present

## 2020-11-08 DIAGNOSIS — R296 Repeated falls: Secondary | ICD-10-CM | POA: Diagnosis present

## 2020-11-08 DIAGNOSIS — Z8249 Family history of ischemic heart disease and other diseases of the circulatory system: Secondary | ICD-10-CM | POA: Diagnosis not present

## 2020-11-08 DIAGNOSIS — Z79899 Other long term (current) drug therapy: Secondary | ICD-10-CM

## 2020-11-08 DIAGNOSIS — S42231D 3-part fracture of surgical neck of right humerus, subsequent encounter for fracture with routine healing: Secondary | ICD-10-CM | POA: Diagnosis not present

## 2020-11-08 DIAGNOSIS — Z872 Personal history of diseases of the skin and subcutaneous tissue: Secondary | ICD-10-CM | POA: Diagnosis not present

## 2020-11-08 DIAGNOSIS — G8911 Acute pain due to trauma: Secondary | ICD-10-CM | POA: Diagnosis not present

## 2020-11-08 DIAGNOSIS — K5900 Constipation, unspecified: Secondary | ICD-10-CM | POA: Diagnosis not present

## 2020-11-08 DIAGNOSIS — N202 Calculus of kidney with calculus of ureter: Secondary | ICD-10-CM | POA: Diagnosis not present

## 2020-11-08 DIAGNOSIS — K529 Noninfective gastroenteritis and colitis, unspecified: Secondary | ICD-10-CM | POA: Diagnosis not present

## 2020-11-08 DIAGNOSIS — E872 Acidosis: Secondary | ICD-10-CM | POA: Diagnosis not present

## 2020-11-08 DIAGNOSIS — Z4682 Encounter for fitting and adjustment of non-vascular catheter: Secondary | ICD-10-CM | POA: Diagnosis not present

## 2020-11-08 DIAGNOSIS — Z743 Need for continuous supervision: Secondary | ICD-10-CM | POA: Diagnosis not present

## 2020-11-08 DIAGNOSIS — E782 Mixed hyperlipidemia: Secondary | ICD-10-CM | POA: Diagnosis not present

## 2020-11-08 HISTORY — DX: Sepsis, unspecified organism: A41.9

## 2020-11-08 HISTORY — PX: COLECTOMY WITH COLOSTOMY CREATION/HARTMANN PROCEDURE: SHX6598

## 2020-11-08 HISTORY — DX: Sepsis, unspecified organism: R65.20

## 2020-11-08 HISTORY — PX: LAPAROTOMY: SHX154

## 2020-11-08 LAB — CBC WITH DIFFERENTIAL/PLATELET
Abs Immature Granulocytes: 0.4 10*3/uL — ABNORMAL HIGH (ref 0.00–0.07)
Band Neutrophils: 11 %
Basophils Absolute: 0 10*3/uL (ref 0.0–0.1)
Basophils Relative: 0 %
Eosinophils Absolute: 0 10*3/uL (ref 0.0–0.5)
Eosinophils Relative: 0 %
HCT: 40.9 % (ref 36.0–46.0)
Hemoglobin: 13.2 g/dL (ref 12.0–15.0)
Lymphocytes Relative: 15 %
Lymphs Abs: 1.4 10*3/uL (ref 0.7–4.0)
MCH: 31.6 pg (ref 26.0–34.0)
MCHC: 32.3 g/dL (ref 30.0–36.0)
MCV: 97.8 fL (ref 80.0–100.0)
Metamyelocytes Relative: 4 %
Monocytes Absolute: 0.3 10*3/uL (ref 0.1–1.0)
Monocytes Relative: 3 %
Neutro Abs: 7 10*3/uL (ref 1.7–7.7)
Neutrophils Relative %: 67 %
Platelets: 273 10*3/uL (ref 150–400)
RBC: 4.18 MIL/uL (ref 3.87–5.11)
RDW: 13.2 % (ref 11.5–15.5)
WBC: 9 10*3/uL (ref 4.0–10.5)
nRBC: 0 % (ref 0.0–0.2)
nRBC: 0 /100 WBC

## 2020-11-08 LAB — URINALYSIS, ROUTINE W REFLEX MICROSCOPIC
Bacteria, UA: NONE SEEN
Bilirubin Urine: NEGATIVE
Glucose, UA: NEGATIVE mg/dL
Ketones, ur: NEGATIVE mg/dL
Leukocytes,Ua: NEGATIVE
Nitrite: NEGATIVE
Protein, ur: 100 mg/dL — AB
Specific Gravity, Urine: 1.043 — ABNORMAL HIGH (ref 1.005–1.030)
pH: 5 (ref 5.0–8.0)

## 2020-11-08 LAB — COMPREHENSIVE METABOLIC PANEL
ALT: 34 U/L (ref 0–44)
AST: 54 U/L — ABNORMAL HIGH (ref 15–41)
Albumin: 3.5 g/dL (ref 3.5–5.0)
Alkaline Phosphatase: 40 U/L (ref 38–126)
Anion gap: 15 (ref 5–15)
BUN: 14 mg/dL (ref 8–23)
CO2: 18 mmol/L — ABNORMAL LOW (ref 22–32)
Calcium: 8.6 mg/dL — ABNORMAL LOW (ref 8.9–10.3)
Chloride: 102 mmol/L (ref 98–111)
Creatinine, Ser: 1.01 mg/dL — ABNORMAL HIGH (ref 0.44–1.00)
GFR, Estimated: 60 mL/min — ABNORMAL LOW (ref >60)
Glucose, Bld: 157 mg/dL — ABNORMAL HIGH (ref 70–99)
Potassium: 3.6 mmol/L (ref 3.5–5.1)
Sodium: 135 mmol/L (ref 135–145)
Total Bilirubin: 1.5 mg/dL — ABNORMAL HIGH (ref 0.3–1.2)
Total Protein: 7.3 g/dL (ref 6.5–8.1)

## 2020-11-08 LAB — I-STAT CHEM 8, ED
BUN: 16 mg/dL (ref 8–23)
Calcium, Ion: 1.05 mmol/L — ABNORMAL LOW (ref 1.15–1.40)
Chloride: 102 mmol/L (ref 98–111)
Creatinine, Ser: 0.9 mg/dL (ref 0.44–1.00)
Glucose, Bld: 155 mg/dL — ABNORMAL HIGH (ref 70–99)
HCT: 39 % (ref 36.0–46.0)
Hemoglobin: 13.3 g/dL (ref 12.0–15.0)
Potassium: 3.5 mmol/L (ref 3.5–5.1)
Sodium: 136 mmol/L (ref 135–145)
TCO2: 21 mmol/L — ABNORMAL LOW (ref 22–32)

## 2020-11-08 LAB — APTT: aPTT: 28 seconds (ref 24–36)

## 2020-11-08 LAB — MRSA PCR SCREENING: MRSA by PCR: NEGATIVE

## 2020-11-08 LAB — TROPONIN I (HIGH SENSITIVITY)
Troponin I (High Sensitivity): 34 ng/L — ABNORMAL HIGH (ref <18)
Troponin I (High Sensitivity): 31 ng/L — ABNORMAL HIGH (ref <18)

## 2020-11-08 LAB — LACTIC ACID, PLASMA
Lactic Acid, Venous: 3.3 mmol/L (ref 0.5–1.9)
Lactic Acid, Venous: 2.9 mmol/L (ref 0.5–1.9)

## 2020-11-08 LAB — GLUCOSE, CAPILLARY
Glucose-Capillary: 138 mg/dL — ABNORMAL HIGH (ref 70–99)
Glucose-Capillary: 131 mg/dL — ABNORMAL HIGH (ref 70–99)

## 2020-11-08 LAB — TYPE AND SCREEN
ABO/RH(D): O POS
Antibody Screen: NEGATIVE

## 2020-11-08 LAB — PROTIME-INR
INR: 1.3 — ABNORMAL HIGH (ref 0.8–1.2)
Prothrombin Time: 15.3 seconds — ABNORMAL HIGH (ref 11.4–15.2)

## 2020-11-08 LAB — LIPASE, BLOOD: Lipase: 25 U/L (ref 11–51)

## 2020-11-08 LAB — RESPIRATORY PANEL BY RT PCR (FLU A&B, COVID)
Influenza A by PCR: NEGATIVE
Influenza B by PCR: NEGATIVE
SARS Coronavirus 2 by RT PCR: NEGATIVE

## 2020-11-08 SURGERY — LAPAROTOMY, EXPLORATORY
Anesthesia: General | Site: Abdomen

## 2020-11-08 SURGERY — LEFT HEART CATH AND CORONARY ANGIOGRAPHY
Anesthesia: LOCAL

## 2020-11-08 MED ORDER — INSULIN ASPART 100 UNIT/ML ~~LOC~~ SOLN
0.0000 [IU] | SUBCUTANEOUS | Status: DC
  Administered 2020-11-09 – 2020-11-10 (×3): 1 [IU] via SUBCUTANEOUS
  Administered 2020-11-10: 0 [IU] via SUBCUTANEOUS
  Administered 2020-11-12 (×3): 1 [IU] via SUBCUTANEOUS

## 2020-11-08 MED ORDER — SODIUM CHLORIDE 0.9 % IV SOLN
2.0000 g | Freq: Once | INTRAVENOUS | Status: AC
  Administered 2020-11-08: 2 g via INTRAVENOUS
  Filled 2020-11-08: qty 2

## 2020-11-08 MED ORDER — ALBUMIN HUMAN 5 % IV SOLN
12.5000 g | Freq: Once | INTRAVENOUS | Status: AC
  Administered 2020-11-08: 12.5 g via INTRAVENOUS

## 2020-11-08 MED ORDER — SODIUM CHLORIDE 0.9 % IV BOLUS: 1000.0000 mL | Freq: Once | INTRAVENOUS | Status: DC

## 2020-11-08 MED ORDER — PIPERACILLIN-TAZOBACTAM 3.375 G IVPB 30 MIN
3.3750 g | Freq: Once | INTRAVENOUS | Status: AC
  Administered 2020-11-13: 3.375 g via INTRAVENOUS
  Filled 2020-11-08: qty 50

## 2020-11-08 MED ORDER — ADULT MULTIVITAMIN W/MINERALS CH
1.0000 | ORAL_TABLET | Freq: Every day | ORAL | Status: DC
  Filled 2020-11-08: qty 1

## 2020-11-08 MED ORDER — ACETAMINOPHEN 325 MG PO TABS
650.0000 mg | ORAL_TABLET | Freq: Once | ORAL | Status: DC
  Filled 2020-11-08: qty 2

## 2020-11-08 MED ORDER — MIDAZOLAM HCL 2 MG/2ML IJ SOLN
INTRAMUSCULAR | Status: AC
  Filled 2020-11-08: qty 2

## 2020-11-08 MED ORDER — FENTANYL CITRATE (PF) 100 MCG/2ML IJ SOLN
INTRAMUSCULAR | Status: AC
  Filled 2020-11-08: qty 2

## 2020-11-08 MED ORDER — ALBUMIN HUMAN 5 % IV SOLN: INTRAVENOUS | Status: DC | PRN

## 2020-11-08 MED ORDER — MIDAZOLAM HCL 2 MG/2ML IJ SOLN
INTRAMUSCULAR | Status: DC | PRN
  Administered 2020-11-08: 2 mg via INTRAVENOUS

## 2020-11-08 MED ORDER — LACTATED RINGERS IV BOLUS (SEPSIS)
250.0000 mL | Freq: Once | INTRAVENOUS | Status: AC
  Administered 2020-11-08: 250 mL via INTRAVENOUS

## 2020-11-08 MED ORDER — FENTANYL CITRATE (PF) 250 MCG/5ML IJ SOLN
INTRAMUSCULAR | Status: AC
  Filled 2020-11-08: qty 5

## 2020-11-08 MED ORDER — MORPHINE SULFATE (PF) 2 MG/ML IV SOLN: 2.0000 mg | INTRAVENOUS | Status: DC | PRN

## 2020-11-08 MED ORDER — PIPERACILLIN-TAZOBACTAM 3.375 G IVPB
3.3750 g | Freq: Three times a day (TID) | INTRAVENOUS | Status: AC
  Administered 2020-11-10 – 2020-11-14 (×15): 3.375 g via INTRAVENOUS
  Filled 2020-11-08 (×16): qty 50

## 2020-11-08 MED ORDER — SUCCINYLCHOLINE CHLORIDE 200 MG/10ML IV SOSY
PREFILLED_SYRINGE | INTRAVENOUS | Status: DC | PRN
  Administered 2020-11-08: 120 mg via INTRAVENOUS

## 2020-11-08 MED ORDER — SODIUM CHLORIDE 0.9 % IV BOLUS
1000.0000 mL | Freq: Once | INTRAVENOUS | Status: AC
  Administered 2020-11-08: 1000 mL via INTRAVENOUS

## 2020-11-08 MED ORDER — ONDANSETRON HCL 4 MG/2ML IJ SOLN
INTRAMUSCULAR | Status: AC
  Filled 2020-11-08: qty 2

## 2020-11-08 MED ORDER — ACETAMINOPHEN 650 MG RE SUPP
650.0000 mg | Freq: Once | RECTAL | Status: AC
  Administered 2020-11-08: 650 mg via RECTAL
  Filled 2020-11-08: qty 1

## 2020-11-08 MED ORDER — FOLIC ACID 1 MG PO TABS
1.0000 mg | ORAL_TABLET | Freq: Every day | ORAL | Status: DC
  Filled 2020-11-08: qty 1

## 2020-11-08 MED ORDER — PHENYLEPHRINE HCL-NACL 10-0.9 MG/250ML-% IV SOLN
INTRAVENOUS | Status: DC | PRN
  Administered 2020-11-08: 50 ug/min via INTRAVENOUS

## 2020-11-08 MED ORDER — POLYETHYLENE GLYCOL 3350 17 G PO PACK: 17.0000 g | PACK | Freq: Every day | ORAL | Status: DC | PRN

## 2020-11-08 MED ORDER — SODIUM CHLORIDE 0.9 % IV SOLN: INTRAVENOUS | Status: DC

## 2020-11-08 MED ORDER — LACTATED RINGERS IV SOLN: INTRAVENOUS | Status: DC | PRN

## 2020-11-08 MED ORDER — DEXAMETHASONE SODIUM PHOSPHATE 10 MG/ML IJ SOLN
INTRAMUSCULAR | Status: AC
  Filled 2020-11-08: qty 1

## 2020-11-08 MED ORDER — LACTATED RINGERS IV BOLUS
1000.0000 mL | Freq: Once | INTRAVENOUS | Status: AC
  Administered 2020-11-08: 1000 mL via INTRAVENOUS

## 2020-11-08 MED ORDER — FENTANYL CITRATE (PF) 100 MCG/2ML IJ SOLN
50.0000 ug | Freq: Once | INTRAMUSCULAR | Status: AC
  Administered 2020-11-08: 50 ug via INTRAVENOUS
  Filled 2020-11-08: qty 2

## 2020-11-08 MED ORDER — ROCURONIUM BROMIDE 10 MG/ML (PF) SYRINGE
PREFILLED_SYRINGE | INTRAVENOUS | Status: DC | PRN
  Administered 2020-11-08: 50 mg via INTRAVENOUS

## 2020-11-08 MED ORDER — LABETALOL HCL 5 MG/ML IV SOLN
INTRAVENOUS | Status: AC
  Filled 2020-11-08: qty 8

## 2020-11-08 MED ORDER — FENTANYL CITRATE (PF) 250 MCG/5ML IJ SOLN
INTRAMUSCULAR | Status: DC | PRN
  Administered 2020-11-08: 50 ug via INTRAVENOUS
  Administered 2020-11-08: 100 ug via INTRAVENOUS
  Administered 2020-11-08 (×2): 50 ug via INTRAVENOUS

## 2020-11-08 MED ORDER — DOCUSATE SODIUM 100 MG PO CAPS: 100.0000 mg | ORAL_CAPSULE | Freq: Two times a day (BID) | ORAL | Status: DC | PRN

## 2020-11-08 MED ORDER — MORPHINE SULFATE (PF) 2 MG/ML IV SOLN
2.0000 mg | INTRAVENOUS | Status: DC | PRN
  Administered 2020-11-08 – 2020-11-09 (×4): 2 mg via INTRAVENOUS
  Filled 2020-11-08 (×4): qty 1

## 2020-11-08 MED ORDER — FENTANYL CITRATE (PF) 100 MCG/2ML IJ SOLN
25.0000 ug | INTRAMUSCULAR | Status: DC | PRN
  Administered 2020-11-08 (×2): 25 ug via INTRAVENOUS

## 2020-11-08 MED ORDER — THIAMINE HCL 100 MG PO TABS
100.0000 mg | ORAL_TABLET | Freq: Every day | ORAL | Status: DC
  Filled 2020-11-08: qty 1

## 2020-11-08 MED ORDER — LIDOCAINE 2% (20 MG/ML) 5 ML SYRINGE
INTRAMUSCULAR | Status: DC | PRN
  Administered 2020-11-08: 100 mg via INTRAVENOUS

## 2020-11-08 MED ORDER — LACTATED RINGERS IV BOLUS (SEPSIS)
1000.0000 mL | Freq: Once | INTRAVENOUS | Status: AC
  Administered 2020-11-08: 1000 mL via INTRAVENOUS

## 2020-11-08 MED ORDER — ONDANSETRON HCL 4 MG/2ML IJ SOLN
INTRAMUSCULAR | Status: DC | PRN
  Administered 2020-11-08: 4 mg via INTRAVENOUS

## 2020-11-08 MED ORDER — PHENYLEPHRINE 40 MCG/ML (10ML) SYRINGE FOR IV PUSH (FOR BLOOD PRESSURE SUPPORT)
PREFILLED_SYRINGE | INTRAVENOUS | Status: AC
  Filled 2020-11-08: qty 10

## 2020-11-08 MED ORDER — SUGAMMADEX SODIUM 200 MG/2ML IV SOLN
INTRAVENOUS | Status: DC | PRN
  Administered 2020-11-08: 200 mg via INTRAVENOUS

## 2020-11-08 MED ORDER — IOHEXOL 300 MG/ML  SOLN
100.0000 mL | Freq: Once | INTRAMUSCULAR | Status: AC | PRN
  Administered 2020-11-08: 100 mL via INTRAVENOUS

## 2020-11-08 MED ORDER — ETOMIDATE 2 MG/ML IV SOLN
INTRAVENOUS | Status: AC
  Filled 2020-11-08: qty 10

## 2020-11-08 MED ORDER — ONDANSETRON HCL 4 MG/2ML IJ SOLN
4.0000 mg | Freq: Four times a day (QID) | INTRAMUSCULAR | Status: DC | PRN
  Administered 2020-11-08 – 2020-11-10 (×7): 4 mg via INTRAVENOUS
  Filled 2020-11-08 (×7): qty 2

## 2020-11-08 MED ORDER — SUCCINYLCHOLINE CHLORIDE 200 MG/10ML IV SOSY
PREFILLED_SYRINGE | INTRAVENOUS | Status: AC
  Filled 2020-11-08: qty 20

## 2020-11-08 MED ORDER — 0.9 % SODIUM CHLORIDE (POUR BTL) OPTIME
TOPICAL | Status: DC | PRN
  Administered 2020-11-08 (×8): 1000 mL

## 2020-11-08 MED ORDER — ONDANSETRON HCL 4 MG/2ML IJ SOLN: 4.0000 mg | Freq: Once | INTRAMUSCULAR | Status: DC | PRN

## 2020-11-08 MED ORDER — ROCURONIUM BROMIDE 10 MG/ML (PF) SYRINGE
PREFILLED_SYRINGE | INTRAVENOUS | Status: AC
  Filled 2020-11-08: qty 10

## 2020-11-08 MED ORDER — SODIUM CHLORIDE 0.9 % IV SOLN: 2.0000 g | Freq: Two times a day (BID) | INTRAVENOUS | Status: DC

## 2020-11-08 MED ORDER — LIDOCAINE 2% (20 MG/ML) 5 ML SYRINGE
INTRAMUSCULAR | Status: AC
  Filled 2020-11-08: qty 5

## 2020-11-08 MED ORDER — ALBUMIN HUMAN 5 % IV SOLN
INTRAVENOUS | Status: AC
  Filled 2020-11-08: qty 250

## 2020-11-08 MED ORDER — EPHEDRINE 5 MG/ML INJ
INTRAVENOUS | Status: AC
  Filled 2020-11-08: qty 10

## 2020-11-08 MED ORDER — LABETALOL HCL 5 MG/ML IV SOLN
INTRAVENOUS | Status: DC | PRN
  Administered 2020-11-08: 2.5 mg via INTRAVENOUS

## 2020-11-08 MED ORDER — TOPIRAMATE 100 MG PO TABS
200.0000 mg | ORAL_TABLET | Freq: Two times a day (BID) | ORAL | Status: DC
  Administered 2020-11-09: 200 mg via ORAL
  Filled 2020-11-08 (×2): qty 2
  Filled 2020-11-08: qty 8

## 2020-11-08 MED ORDER — PIPERACILLIN-TAZOBACTAM 3.375 G IVPB
3.3750 g | INTRAVENOUS | Status: AC
  Filled 2020-11-08: qty 50

## 2020-11-08 MED ORDER — PANTOPRAZOLE SODIUM 40 MG IV SOLR
40.0000 mg | Freq: Every day | INTRAVENOUS | Status: DC
  Administered 2020-11-08 – 2020-11-13 (×6): 40 mg via INTRAVENOUS
  Filled 2020-11-08 (×6): qty 40

## 2020-11-08 MED ORDER — LACTATED RINGERS IV SOLN: INTRAVENOUS | Status: AC

## 2020-11-08 MED ORDER — ONDANSETRON HCL 4 MG/2ML IJ SOLN
4.0000 mg | Freq: Once | INTRAMUSCULAR | Status: AC
  Administered 2020-11-08: 4 mg via INTRAVENOUS
  Filled 2020-11-08: qty 2

## 2020-11-08 MED ORDER — PHENYLEPHRINE 40 MCG/ML (10ML) SYRINGE FOR IV PUSH (FOR BLOOD PRESSURE SUPPORT): PREFILLED_SYRINGE | INTRAVENOUS | Status: DC | PRN

## 2020-11-08 SURGICAL SUPPLY — 41 items
BNDG GAUZE ELAST 4 BULKY (GAUZE/BANDAGES/DRESSINGS) ×4 IMPLANT
CANISTER SUCT 3000ML PPV (MISCELLANEOUS) ×4 IMPLANT
CHLORAPREP W/TINT 26 (MISCELLANEOUS) ×4 IMPLANT
COVER SURGICAL LIGHT HANDLE (MISCELLANEOUS) ×4 IMPLANT
COVER WAND RF STERILE (DRAPES) ×4 IMPLANT
DRAPE LAPAROSCOPIC ABDOMINAL (DRAPES) ×4 IMPLANT
DRAPE WARM FLUID 44X44 (DRAPES) ×4 IMPLANT
ELECT CAUTERY BLADE 6.4 (BLADE) ×4 IMPLANT
ELECT REM PT RETURN 9FT ADLT (ELECTROSURGICAL) ×4
ELECTRODE REM PT RTRN 9FT ADLT (ELECTROSURGICAL) ×2 IMPLANT
GLOVE BIO SURGEON STRL SZ 6 (GLOVE) ×4 IMPLANT
GLOVE INDICATOR 6.5 STRL GRN (GLOVE) ×4 IMPLANT
GOWN STRL REUS W/ TWL LRG LVL3 (GOWN DISPOSABLE) ×2 IMPLANT
GOWN STRL REUS W/TWL 2XL LVL3 (GOWN DISPOSABLE) ×4 IMPLANT
GOWN STRL REUS W/TWL LRG LVL3 (GOWN DISPOSABLE) ×4
HANDLE SUCTION POOLE (INSTRUMENTS) ×2 IMPLANT
KIT BASIN OR (CUSTOM PROCEDURE TRAY) ×4 IMPLANT
KIT OSTOMY DRAINABLE 2.75 STR (WOUND CARE) ×4 IMPLANT
KIT TURNOVER KIT B (KITS) ×4 IMPLANT
LIGASURE IMPACT 36 18CM CVD LR (INSTRUMENTS) ×4 IMPLANT
NS IRRIG 1000ML POUR BTL (IV SOLUTION) ×8 IMPLANT
PACK GENERAL/GYN (CUSTOM PROCEDURE TRAY) ×4 IMPLANT
PAD ABD 8X10 STRL (GAUZE/BANDAGES/DRESSINGS) ×8 IMPLANT
PAD ARMBOARD 7.5X6 YLW CONV (MISCELLANEOUS) ×4 IMPLANT
PENCIL SMOKE EVACUATOR (MISCELLANEOUS) ×4 IMPLANT
RELOAD PROXIMATE 75MM BLUE (ENDOMECHANICALS) ×8 IMPLANT
STAPLER CUT CVD 40MM BLUE (STAPLE) ×4 IMPLANT
STAPLER PROXIMATE 75MM BLUE (STAPLE) ×4 IMPLANT
STAPLER VISISTAT 35W (STAPLE) ×4 IMPLANT
SUCTION POOLE HANDLE (INSTRUMENTS) ×4
SUT PDS AB 1 TP1 96 (SUTURE) ×8 IMPLANT
SUT PROLENE 2 0 SH 30 (SUTURE) ×4 IMPLANT
SUT VIC AB 2-0 SH 18 (SUTURE) ×4 IMPLANT
SUT VIC AB 3-0 SH 18 (SUTURE) ×4 IMPLANT
SUT VIC AB 3-0 SH 8-18 (SUTURE) ×4 IMPLANT
SUT VICRYL AB 2 0 TIES (SUTURE) ×4 IMPLANT
SUT VICRYL AB 3 0 TIES (SUTURE) ×4 IMPLANT
SYR BULB IRRIG 60ML STRL (SYRINGE) ×4 IMPLANT
TOWEL GREEN STERILE (TOWEL DISPOSABLE) ×4 IMPLANT
TOWEL GREEN STERILE FF (TOWEL DISPOSABLE) ×4 IMPLANT
TRAY FOLEY MTR SLVR 16FR STAT (SET/KITS/TRAYS/PACK) IMPLANT

## 2020-11-08 NOTE — Progress Notes (Signed)
Notified bedside nurse of need to draw repeat lactic acid. 

## 2020-11-08 NOTE — Anesthesia Preprocedure Evaluation (Addendum)
Anesthesia Evaluation  Patient identified by MRN, date of birth, ID band Patient awake    Reviewed: Allergy & Precautions, NPO status , Patient's Chart, lab work & pertinent test results, reviewed documented beta blocker date and time   Airway Mallampati: II  TM Distance: >3 FB Neck ROM: Full    Dental  (+) Teeth Intact   Pulmonary neg pulmonary ROS,    Pulmonary exam normal breath sounds clear to auscultation       Cardiovascular hypertension, Pt. on medications and Pt. on home beta blockers  Rhythm:Regular Rate:Tachycardia     Neuro/Psych  Headaches, Cervical post-laminectomy syndrome  Neuromuscular disease negative psych ROS   GI/Hepatic Neg liver ROS, GERD  Medicated,BOWEL OBSTRUCTION   Endo/Other  negative endocrine ROS  Renal/GU negative Renal ROS     Musculoskeletal  (+) Arthritis , Osteoarthritis,  Chronic pain syndrome   Abdominal   Peds  Hematology negative hematology ROS (+)   Anesthesia Other Findings Day of surgery medications reviewed with the patient.  Reproductive/Obstetrics                            Anesthesia Physical Anesthesia Plan  ASA: III  Anesthesia Plan: General   Post-op Pain Management:    Induction: Intravenous, Rapid sequence and Cricoid pressure planned  PONV Risk Score and Plan: 4 or greater and Dexamethasone and Ondansetron  Airway Management Planned: Oral ETT  Additional Equipment:   Intra-op Plan:   Post-operative Plan: Possible Post-op intubation/ventilation  Informed Consent: I have reviewed the patients History and Physical, chart, labs and discussed the procedure including the risks, benefits and alternatives for the proposed anesthesia with the patient or authorized representative who has indicated his/her understanding and acceptance.       Plan Discussed with: CRNA  Anesthesia Plan Comments:        Anesthesia Quick  Evaluation

## 2020-11-08 NOTE — Transfer of Care (Signed)
Immediate Anesthesia Transfer of Care Note  Patient: Chloe Young  Procedure(s) Performed: EXPLORATORY LAPAROTOMY (N/A Abdomen) SIGMOID COLECTOMY WITH COLOSTOMY CREATION/HARTMANN PROCEDURE (Abdomen)  Patient Location: PACU  Anesthesia Type:General  Level of Consciousness: drowsy  Airway & Oxygen Therapy: Patient Spontanous Breathing  Post-op Assessment: Report given to RN and Post -op Vital signs reviewed and stable  Post vital signs: Reviewed and stable  Last Vitals:  Vitals Value Taken Time  BP 93/55 11/08/20 2040  Temp 37.9 C 11/08/20 2040  Pulse 98 11/08/20 2041  Resp 22 11/08/20 2041  SpO2 100 % 11/08/20 2041  Vitals shown include unvalidated device data.  Last Pain:  Vitals:   11/08/20 1627  TempSrc:   PainSc: 10-Worst pain ever         Complications: No complications documented.

## 2020-11-08 NOTE — Op Note (Signed)
  PREOPERATIVE DIAGNOSIS: Perforated diverticulitis.   POSTOPERATIVE DIAGNOSIS: Perforated diverticulitis.   PROCEDURE PERFORMED: sigmoid colectomy and end colostomy (Hartmann's procedure)  SURGEON: Stark Klein, MD   ANESTHESIA: General.   FINDINGS: generalized peritonitis   ESTIMATED BLOOD LOSS: 100 mL.   COMPLICATIONS: None known.   PROCEDURE:  Patient was identified in the holding area and taken to  the operating room where she was placed supine on the operating room  table. General anesthesia was induced. Right arm was tucked and foley catheter was placed.  The abdomen was prepped and draped in a sterile fashion. Time-out was performed according to the  surgical safety check list. When all was correct we continued.   A midline incision was made with a #10 blade and the subcutaneous tissues were divided with a Bovie electrocautery. The fascia was opened in the midline.  Purulent fluid was aspirated from the abdomen. The omentum was reflected superiorly. The small bowel was gently pulled up out of the abdomen and pelvis.  There were multiple loops with significant inflammatory rind on them, but no small bowel perforation was found.  The small bowel was packed into the upper abdomen.  The sigmoid was then examined.  The distal sigmoid had a very tiny perforation and a region of inflammation that was short, around 5 cm in length. The proximal sigmoid was identified and was not inflamed.  This was then divided with the GIA 75 mm stapler.    The Balfour retractor was used to facilitate visibility. The Ligasure was used to divide the mesocolon high up near the colon wall going distally.  The rectum was divided with the contour stapler distal to the site of inflammation. The abdomen was then irrigated copiously.    Attention was directed to the left abdomen for a location for an  ostomy. Kochers were used to pull the fascia to the midline and an  another Fransisca Kaufmann was used to elevate the skin  to create a circular site.  A divot of fatty tissue was taken as well. The fascia was opened in a  cruciate fashion separating the longitudinal fibers of the rectus. A  lap was placed behind the fascia to ensure that the Bovie could not go  through and puncture any of the intra-abdominal organs. A Kary Kos was  then advanced through the abdominal wall and used to retract the stump  of the descending colon through the abdominal wall.  The distal descending colon was freed up just a little to facilitate its reach through the abdominal wall.    The abdomen was then closed using running #1 looped PDS sutures. The wound  was then irrigated and packed with moist Kerlix. The colon was then  opened with the Bovie.  3-0 Vicryl interrupted sutures were used to create the ostomy. This was then dressed with a 2 piece wafer.   The patient was allowed to emerge from anesthesia and taken to the PACU in stable condition.  Needle, sponge, and instrument counts were correct x 2.

## 2020-11-08 NOTE — Interval H&P Note (Signed)
History and Physical Interval Note:  11/08/2020 6:18 PM  Chloe Young  has presented today for surgery, with the diagnosis of BOWEL OBSTRUCTION.  The various methods of treatment have been discussed with the patient and family. After consideration of risks, benefits and other options for treatment, the patient has consented to  Procedure(s): EXPLORATORY LAPAROTOMY POSSIBLE BOWEL RESECTION POSSIBLE OSTEOTOMY (N/A) as a surgical intervention.  The patient's history has been reviewed, patient examined, no change in status, stable for surgery.  I have reviewed the patient's chart and labs.  Questions were answered to the patient's satisfaction.     Stark Klein

## 2020-11-08 NOTE — ED Notes (Signed)
Chloe Young [phone] would like an update (son)

## 2020-11-08 NOTE — ED Notes (Signed)
Contact Lorie Cleckley (Son)  [phone] for updates on pt.

## 2020-11-08 NOTE — ED Notes (Signed)
Date and time results received: 11/08/20  Test: Lactic Acid Critical Value: 3.3  Name of Provider Notified: Tomi Bamberger MD

## 2020-11-08 NOTE — Progress Notes (Signed)
Pharmacy Antibiotic Note  MARGART ZEMANEK is a 69 y.o. female admitted on 11/08/2020 s/p fall with abdominal pain/nausea concern for infection.  Pharmacy has been consulted for cefepime dosing.  Plan: Cefepime 2g IV every 12 hours Monitor renal function, Cx and clinical progression to narrow     No data recorded.  No results for input(s): WBC, CREATININE, LATICACIDVEN, VANCOTROUGH, VANCOPEAK, VANCORANDOM, GENTTROUGH, GENTPEAK, GENTRANDOM, TOBRATROUGH, TOBRAPEAK, TOBRARND, AMIKACINPEAK, AMIKACINTROU, AMIKACIN in the last 168 hours.  CrCl cannot be calculated (Patient's most recent lab result is older than the maximum 21 days allowed.).    No Known Allergies  Bertis Ruddy, PharmD Clinical Pharmacist ED Pharmacist Phone # [phone] 11/08/2020 12:47 PM

## 2020-11-08 NOTE — OR Nursing (Signed)
Patient allowed Dr. Tobias Alexander and Junie Bame, CRNA to remove four yellow/gold diamond earrings and placed in specimen cup. Patient labeled applied to cup and placed in clear bag with additional patient sticker and attached to the chart.

## 2020-11-08 NOTE — Consult Note (Signed)
Consult Note  Chloe Young 1951-11-08  [id number].    Requesting MD: Tomi Bamberger Chief Complaint/Reason for Consult: perforated viscus   HPI:  Patient is a 69 year old female who was brought in via EMS after near syncopal event and sliding off the couch. She was too weak to stand and her husband was unable to get her up. EMS found patient to be tachycardic and were concerned for possible STEMI. Cardiology saw patient on arrival and STEMI has been ruled out. Patient reported pain in abdomen started yesterday in upper abdomen and has been associated with nausea and vomiting. She denies dark or bloody stools. Denies chest pain. No sick contacts recently. Increased urination. Patient denies increase in NSAIDs, steroids. She does drink about 2 drinks nightly. No tobacco use.   PMH otherwise significant for recent fall 1 week ago with right shoulder injury which she has seen orthopedics for, HTN, HLD, chronic pain with hx of back surgeries. No past abdominal surgery in chart. NKDA.  ROS:  Review of Systems  Unable to perform ROS: Mental status change  Gastrointestinal: Positive for abdominal pain.  Musculoskeletal: Positive for joint pain (R shoulder pain ).    Family History  Problem Relation Age of Onset   Diverticulitis Mother 43       with fissures.Marland Kitchenopted for no surgery   Cancer Father 55        malignant brain tumor   Breast cancer Maternal Aunt    Diabetes Maternal Grandfather    Heart failure Maternal Grandfather    Stroke Maternal Uncle        in 47s   Heart disease Maternal Uncle        X4 ; 1 had MI @ 28   Hypertension Sister     Past Medical History:  Diagnosis Date   Cervical facet syndrome    Cervical post-laminectomy syndrome    Dr T[facility]   Cervicalgia    Chronic pain syndrome    Hyperlipidemia    joint pain with statin   Hypertension    Osteoarthritis, localized, knee    OA AND PAIN LEFT KNEE  -- S/P RIGHT  KNEE ARTHROPLASTY ON  82012--KNEE IS DOING WELL.   Osteopenia 06/2016    T score -1.6 FRAX not calculated    Past Surgical History:  Procedure Laterality Date   CERVIAL FUSION  2006   Dr Arnoldo Morale, [place]   COLONOSCOPY  2011    Dr Fuller Plan   [facility]  09/03/13   L 5- S1 ; Dr Nelva Bush   JOINT REPLACEMENT  AUG 2012    RIGHT KNEE ARTHROPLASTY   KNEE ARTHROSCOPY     bilaterally; Dr Tonita Cong   TOTAL KNEE ARTHROPLASTY  01/11/2012   Procedure: TOTAL KNEE ARTHROPLASTY;  Surgeon: Johnn Hai, MD;  Location: [facility];  Service: Orthopedics;  Laterality: Left;  General with Femoral nerve block    Social History:  reports that she has never smoked. She has never used smokeless tobacco. She reports current alcohol use of about 14.0 standard drinks of alcohol per week. She reports that she does not use drugs.  Allergies: No Known Allergies  (Not in a hospital admission)   Blood pressure 123/76, pulse (!) 130, temperature (!) 103.1 F (39.5 C), temperature source Oral, resp. rate (!) 24, height 5\' 3"  (1.6 m), weight 59.9 kg, SpO2 95 %. Physical Exam:  General: pleasant, WD, WN female/female HEENT: head is normocephalic, atraumatic.  Sclera are noninjected.  PERRL.  Ears and nose without any masses or lesions.  Mouth is pink and moist Heart: sinus tachy in 130s.  Normal s1,s2. No obvious murmurs, gallops, or rubs noted.  Palpable radial and pedal pulses bilaterally Lungs: CTAB, no wheezes, rhonchi, or rales noted.  Respiratory effort nonlabored Abd: soft, generalized peritonitis with guarding and rebound tenderness, distended,  MS: RUE in shoulder sling Skin: warm and dry with no masses, lesions, or rashes Neuro: Cranial nerves 2-12 grossly intact, sensation is normal throughout Psych: seems mildly confused    Results for orders placed or performed during the hospital encounter of 11/08/20 (from the past 48 hour(s))  Comprehensive metabolic panel     Status: Abnormal   Collection Time: 11/08/20 12:36 PM  Result Value Ref  Range   Sodium 135 135 - 145 mmol/L   Potassium 3.6 3.5 - 5.1 mmol/L   Chloride 102 98 - 111 mmol/L   CO2 18 (L) 22 - 32 mmol/L   Glucose, Bld 157 (H) 70 - 99 mg/dL    Comment: Glucose reference range applies only to samples taken after fasting for at least 8 hours.   BUN 14 8 - 23 mg/dL   Creatinine, Ser 1.01 (H) 0.44 - 1.00 mg/dL   Calcium 8.6 (L) 8.9 - 10.3 mg/dL   Total Protein 7.3 6.5 - 8.1 g/dL   Albumin 3.5 3.5 - 5.0 g/dL   AST 54 (H) 15 - 41 U/L   ALT 34 0 - 44 U/L   Alkaline Phosphatase 40 38 - 126 U/L   Total Bilirubin 1.5 (H) 0.3 - 1.2 mg/dL   GFR, Estimated 60 (L) >60 mL/min    Comment: (NOTE) Calculated using the CKD-EPI Creatinine Equation (2021)    Anion gap 15 5 - 15    Comment: Performed at [facility], [place] [address], [place], [place] [address]  Lipase, blood     Status: None   Collection Time: 11/08/20 12:36 PM  Result Value Ref Range   Lipase 25 11 - 51 U/L    Comment: Performed at [facility], [place] [address] [place], [place] [address]  CBC with Diff     Status: Abnormal   Collection Time: 11/08/20 12:36 PM  Result Value Ref Range   WBC 9.0 4.0 - 10.5 K/uL   RBC 4.18 3.87 - 5.11 MIL/uL   Hemoglobin 13.2 12.0 - 15.0 g/dL   HCT 40.9 36 - 46 %   MCV 97.8 80.0 - 100.0 fL   MCH 31.6 26.0 - 34.0 pg   MCHC 32.3 30.0 - 36.0 g/dL   RDW 13.2 11.5 - 15.5 %   Platelets 273 150 - 400 K/uL   nRBC 0.0 0.0 - 0.2 %   Neutrophils Relative % 67 %   Neutro Abs 7.0 1.7 - 7.7 K/uL   Band Neutrophils 11 %   Lymphocytes Relative 15 %   Lymphs Abs 1.4 0.7 - 4.0 K/uL   Monocytes Relative 3 %   Monocytes Absolute 0.3 0.1 - 1.0 K/uL   Eosinophils Relative 0 %   Eosinophils Absolute 0.0 0.0 - 0.5 K/uL   Basophils Relative 0 %   Basophils Absolute 0.0 0.0 - 0.1 K/uL   WBC Morphology See Note     Comment: Vaculated Neutrophils  Mild Left Shift. 1 to 5% Metas and Myelos, Occ Pro Noted.    nRBC 0 0 /100 WBC   Metamyelocytes Relative 4 %   Abs Immature  Granulocytes 0.40 (H) 0.00 -  0.07 K/uL    Comment: Performed at [facility], [place] [address], [place], [place] [address]  Respiratory Panel by RT PCR (Flu A&B, Covid) - Nasopharyngeal Swab     Status: None   Collection Time: 11/08/20 12:36 PM   Specimen: Nasopharyngeal Swab  Result Value Ref Range   SARS Coronavirus 2 by RT PCR NEGATIVE NEGATIVE    Comment: (NOTE) SARS-CoV-2 target nucleic acids are NOT DETECTED.  The SARS-CoV-2 RNA is generally detectable in upper respiratoy specimens during the acute phase of infection. The lowest concentration of SARS-CoV-2 viral copies this assay can detect is 131 copies/mL. A negative result does not preclude SARS-Cov-2 infection and should not be used as the sole basis for treatment or other patient management decisions. A negative result may occur with  improper specimen collection/handling, submission of specimen other than nasopharyngeal swab, presence of viral mutation(s) within the areas targeted by this assay, and inadequate number of viral copies (<131 copies/mL). A negative result must be combined with clinical observations, patient history, and epidemiological information. The expected result is Negative.  Fact Sheet for Patients:  PinkCheek.be  Fact Sheet for Healthcare Providers:  GravelBags.it  This test is no t yet approved or cleared by the [place] FDA and  has been authorized for detection and/or diagnosis of SARS-CoV-2 by FDA under an Emergency Use Authorization (EUA). This EUA will remain  in effect (meaning this test can be used) for the duration of the COVID-19 declaration under Section 564(b)(1) of the Act, 21 U.S.C. section 360bbb-3(b)(1), unless the authorization is terminated or revoked sooner.     Influenza A by PCR NEGATIVE NEGATIVE   Influenza B by PCR NEGATIVE NEGATIVE    Comment: (NOTE) The Xpert Xpress SARS-CoV-2/FLU/RSV assay is intended  as an aid in  the diagnosis of influenza from Nasopharyngeal swab specimens and  should not be used as a sole basis for treatment. Nasal washings and  aspirates are unacceptable for Xpert Xpress SARS-CoV-2/FLU/RSV  testing.  Fact Sheet for Patients: PinkCheek.be  Fact Sheet for Healthcare Providers: GravelBags.it  This test is not yet approved or cleared by the [place] FDA and  has been authorized for detection and/or diagnosis of SARS-CoV-2 by  FDA under an Emergency Use Authorization (EUA). This EUA will remain  in effect (meaning this test can be used) for the duration of the  Covid-19 declaration under Section 564(b)(1) of the Act, 21  U.S.C. section 360bbb-3(b)(1), unless the authorization is  terminated or revoked. Performed at [facility], [place] [address], [place], [place] [address]   Troponin I (High Sensitivity)     Status: Abnormal   Collection Time: 11/08/20 12:36 PM  Result Value Ref Range   Troponin I (High Sensitivity) 34 (H) <18 ng/L    Comment: (NOTE) Elevated high sensitivity troponin I (hsTnI) values and significant  changes across serial measurements may suggest ACS but many other  chronic and acute conditions are known to elevate hsTnI results.  Refer to the "Links" section for chest pain algorithms and additional  guidance. Performed at [facility], [place] [address], [place], [place] [address]   Protime-INR     Status: Abnormal   Collection Time: 11/08/20 12:36 PM  Result Value Ref Range   Prothrombin Time 15.3 (H) 11.4 - 15.2 seconds   INR 1.3 (H) 0.8 - 1.2    Comment: (NOTE) INR goal varies based on device and disease states. Performed at [facility], [place]  [address], [place], [place] [address]   APTT     Status: None   Collection Time: 11/08/20 12:36 PM  Result Value Ref Range   aPTT 28 24 - 36 seconds    Comment: Performed at [place] [address], [place], [place] [address]  Type and screen [place]     Status: None   Collection Time: 11/08/20 12:40 PM  Result Value Ref Range   ABO/RH(D) O POS    Antibody Screen NEG    Sample Expiration      11/11/2020,2359 Performed at [facility], [place] [address] [place], [place] [address]   Lactic acid, plasma     Status: Abnormal   Collection Time: 11/08/20 12:40 PM  Result Value Ref Range   Lactic Acid, Venous 3.3 (HH) 0.5 - 1.9 mmol/L    Comment: CRITICAL RESULT CALLED TO, READ BACK BY AND VERIFIED WITH: STRADER,A RN @ 3149 11/08/20 LEONARD,A Performed at [facility], [place] [address] [place],  [address]   I-stat chem 8, ED (not at [facility] or [facility])     Status: Abnormal   Collection Time: 11/08/20 12:46 PM  Result Value Ref Range   Sodium 136 135 - 145 mmol/L   Potassium 3.5 3.5 - 5.1 mmol/L   Chloride 102 98 - 111 mmol/L   BUN 16 8 - 23 mg/dL   Creatinine, Ser 0.90 0.44 - 1.00 mg/dL   Glucose, Bld 155 (H) 70 - 99 mg/dL    Comment: Glucose reference range applies only to samples taken after fasting for at least 8 hours.   Calcium, Ion 1.05 (L) 1.15 - 1.40 mmol/L   TCO2 21 (L) 22 - 32 mmol/L   Hemoglobin 13.3 12.0 - 15.0 g/dL   HCT 39.0 36 - 46 %   CT ABDOMEN PELVIS W CONTRAST  Result Date: 11/08/2020 CLINICAL DATA:  69 year old female with history of abdominal pain. Fever. EXAM: CT ABDOMEN AND PELVIS WITH CONTRAST TECHNIQUE: Multidetector CT imaging of the abdomen and pelvis was performed using the standard protocol following bolus administration of intravenous contrast. CONTRAST:  187mL OMNIPAQUE IOHEXOL 300 MG/ML  SOLN COMPARISON:  No priors. FINDINGS: Lower chest: Subsegmental atelectasis noted in the lower lobes of the lungs bilaterally. Hepatobiliary: No suspicious cystic or solid hepatic lesions. No intra or extrahepatic biliary ductal dilatation. Gallbladder is unremarkable in appearance. Pancreas: No pancreatic mass. No pancreatic ductal  dilatation. No pancreatic or peripancreatic fluid collections or inflammatory changes. Spleen: Unremarkable. Adrenals/Urinary Tract: Bilateral kidneys and adrenal glands are normal in appearance. No hydroureteronephrosis. Urinary bladder is normal in appearance. Stomach/Bowel: Stomach is unremarkable in appearance. There are numerous dilated loops of proximal to mid small bowel, with gradual transition to nondilated loops of mid to distal small bowel. The exact transition point is difficult to localize, however, in the low anatomic pelvis there are several loops of thickened small bowel which demonstrate associated mucosal hyperenhancement, with some surrounding fluid and small amount of extraluminal gas, concerning for regional enteritis affecting predominantly the mid to distal jejunum and/or proximal ileum. Terminal ileum is decompressed, but otherwise grossly unremarkable in appearance. Appendix is normal. Colon is relatively decompressed. There are numerous colonic diverticuli, particularly in the sigmoid colon. There are some mild inflammatory changes in the sigmoid colon, without definitive focal inflammatory changes to clearly indicate an acute diverticulitis at this time. Vascular/Lymphatic: Aortic atherosclerosis without evidence of aneurysm or dissection in the abdominal or pelvic vasculature. No lymphadenopathy noted in  the abdomen or pelvis. Reproductive: Uterus and ovaries are unremarkable in appearance. Other: Small volume of ascites most evident in the central small bowel mesentery adjacent to the inflamed loops of small bowel. Small amount of pneumoperitoneum, also most evident in the small bowel mesentery adjacent to the inflamed loops of small bowel, and in the upper abdomen adjacent to the liver. Musculoskeletal: There are no aggressive appearing lytic or blastic lesions noted in the visualized portions of the skeleton. IMPRESSION: 1. Findings are compatible with an acute bowel perforation. The  exact site of perforation is uncertain, however, the favored candidate is the mid small bowel (likely distal jejunum or proximal ileum) where there are multiple loops of thickened and inflamed small bowel with adjacent fluid and gas in the small bowel mesentery. Associated with this there is a partial small bowel obstruction, as above. 2. There is also colonic diverticulosis. There are some subtle inflammatory changes in the sigmoid mesocolon. The possibility of an acute diverticulitis is not excluded, but not strongly favored on the basis of today's examination; however, direct inspection at time of potential laparoscopy is suggested as this could alternatively represent this site of perforation. 3. Aortic atherosclerosis. Critical Value/emergent results were called by telephone at the time of interpretation on 11/08/2020 at 3:09 pm to provider [facility], who verbally acknowledged these results. Electronically Signed   By: Vinnie Langton M.D.   On: 11/08/2020 15:10   DG Chest Portable 1 View  Result Date: 11/08/2020 CLINICAL DATA:  Right-sided chest pain EXAM: PORTABLE CHEST 1 VIEW COMPARISON:  07/24/2011 FINDINGS: The heart size and mediastinal contours are within normal limits. Lung volumes with mild bibasilar atelectasis. No pleural effusion or pneumothorax. There is an acute fracture of the proximal right humerus with transverse component involving the surgical neck as well as a nondisplaced component involving the greater tuberosity. Fracture is mildly displaced and angulated. Glenohumeral joint alignment appears maintained without dislocation. IMPRESSION: 1. Acute fracture of the proximal right humerus. 2. Low lung volumes with mild bibasilar atelectasis. Electronically Signed   By: Davina Poke D.O.   On: 11/08/2020 13:03      Assessment/Plan Perforated viscus - unsure whether it is small bowel vs stomach in etiology - to OR this evening with Dr. Barry Dienes for exploration - medicine to admit  and we will follow post-operatively  - discussed with son and husband over the phone   Norm Parcel, [facility] 11/08/2020, 4:10 PM Please see Amion for pager number during day hours 7:00am-4:30pm

## 2020-11-08 NOTE — H&P (Signed)
NAME:  Chloe Young, MRN:  [id number], DOB:  08/17/1951, LOS: 0 ADMISSION DATE:  11/08/2020, CONSULTATION DATE:  11/08/2020 REFERRING MD:  Dr. Tomi Bamberger, CHIEF COMPLAINT:  Sepsis with bowel peforation   Brief History   69yo female admitted after being a mechanical with associated right shoulder pain, abdominal pain, nausea and vomiting. She was found to be septic due to bowel perforation and has an acute fracture to the right humerus.   History of present illness   Chloe Young is a 69yo female with PHX significant for osteopenia, HTN, HLD, chronic pain with cervical post laminectomy syndrome who presented to the ED after fall at home. Patient reports she slid off the couch to the floor and was to weak to get up therefore EMS was called.Per chart review patient reported prior fall a week prior to admission where she injured the right shoulder and was possible diagnosed with a humeral fracture at that time. She additionally reported upper abdominal pain, nausea, and vomiting on admission.  On EMS arrival she was seen tachycardic with EKG changes prompting STEMI activation.   On arrival to ED patient was immediately meet by cardiology who felt that patient did not meet STEMI criteria and code STEMI was canceled. Patient was seen febrile with T-max 103.1, tachypenic, tachycardiac with HR 130-140, and intermittently hypotensive making concern for sepsis high. Labwork significant for: creatinine 1.01, AST 54, troponin 34, lactic acid 3.3, and INR 1.3. CT abdomen was obtained to evaluate for possible infection source and acute bowel perforation was seen. General surgery was consulted and will take patient for surgical repair. Given sepsis with tachycardia and intermittent hypotension PCCM was consulted for further management and admission   Past Medical History  Osteopenia, HTN, HLD, chronic pain with cervical post laminectomy syndrome  Significant Hospital Events   Admitted 11/15 with sepsis from acute  bowel perforation   Consults:  General surgery   Procedures:  Exploratory laparotomy 11/15   Significant Diagnostic Tests:  CT ABD 11/15 > acute bowel perforation with colonic diverticulosis   Micro Data:  COVID 11/15 > negative   Antimicrobials:  Cefepime 11/15 x1 Zosyn 11/15 >   Interim history/subjective:  Lying in bed, recently vomited   Objective   Blood pressure 123/76, pulse (!) 130, temperature (!) 103.1 F (39.5 C), temperature source Oral, resp. rate (!) 24, height 5\' 3"  (1.6 m), weight 59.9 kg, SpO2 95 %.       No intake or output data in the 24 hours ending 11/08/20 1618 Filed Weights   11/08/20 1304  Weight: 59.9 kg    Examination: General:  Elderly F, awake, uncomfortable-appearing HEENT: MM pink/dry Neuro: awake and following commands, oriented to person and place, slightly odd affect CV: s1s2 tachycardic, rrr, no m/r/g PULM:  CTAB on room air GI: mildly distended and diffusely tender to palpation throughout, scattered BS Extremities: warm/dry, no edema  Skin: no rashes or lesions  Resolved Hospital Problem list     Assessment & Plan:  Severe sepsis  -She received aggressive IV hydration in ED -Criteria:  Temperature 103.1, respiratory rate 25 heart rate 130-140, acutely altered mental status with positive lactic acidosis  P: Admit ICU Supplemental oxygen to maintain sats greater than 92% Pan cultures prior to antibiotic IV Zosyn  Aggressive IV hydration provided on arrival  MAP goal greater than 65 Trend lactic acid  Acute bowel perforation  -Seen by surgery and plan for ex-lap today, blood pressure borderline after 3L IVF P: Management per surgery,  admit to ICU for close post-op monitoring, BP stable but may require pressors peri-operatively Change Cefepime to Zosyn  Follow blood cultures  Acute R humerus fracture and recent fall with hx of osteoporosis and ETOH use  -Pt reports a fall on 11/10, did not seek medical care after this  and details are vague from both patient and husband. ? Related to ETOH use. Reports 1-2 drinks per night P: Sling to R arm, no hip fx on CT pelvis or obvious traumatic wounds on exam Will need ortho f/u non-acutely  Check ETOH level and UDS CIWA if needed  Folate and Thiamine  Hypertension Home medications include HCTZ and Metoprolol P: Hold home medications   Best practice:  Diet: NPO Pain/Anxiety/Delirium protocol (if indicated): morphine prn VAP protocol (if indicated): n/a DVT prophylaxis: scd's GI prophylaxis: n/a Glucose control: SSI Mobility: bed rest Code Status: full code Family Communication: Husband updated  Disposition: ICU  Labs   CBC: Recent Labs  Lab 11/08/20 1236 11/08/20 1246  WBC 9.0  --   NEUTROABS 7.0  --   HGB 13.2 13.3  HCT 40.9 39.0  MCV 97.8  --   PLT 273  --     Basic Metabolic Panel: Recent Labs  Lab 11/08/20 1236 11/08/20 1246  NA 135 136  K 3.6 3.5  CL 102 102  CO2 18*  --   GLUCOSE 157* 155*  BUN 14 16  CREATININE 1.01* 0.90  CALCIUM 8.6*  --    GFR: Estimated Creatinine Clearance: 48.1 mL/min (by C-G formula based on SCr of 0.9 mg/dL). Recent Labs  Lab 11/08/20 1236 11/08/20 1240  WBC 9.0  --   LATICACIDVEN  --  3.3*    Liver Function Tests: Recent Labs  Lab 11/08/20 1236  AST 54*  ALT 34  ALKPHOS 40  BILITOT 1.5*  PROT 7.3  ALBUMIN 3.5   Recent Labs  Lab 11/08/20 1236  LIPASE 25   No results for input(s): AMMONIA in the last 168 hours.  ABG    Component Value Date/Time   TCO2 21 (L) 11/08/2020 1246     Coagulation Profile: Recent Labs  Lab 11/08/20 1236  INR 1.3*    Cardiac Enzymes: No results for input(s): CKTOTAL, CKMB, CKMBINDEX, TROPONINI in the last 168 hours.  HbA1C: Hgb A1c MFr Bld  Date/Time Value Ref Range Status  03/10/2020 01:03 PM 5.4 4.6 - 6.5 % Final    Comment:    Glycemic Control Guidelines for People with Diabetes:Non Diabetic:  <6%Goal of Therapy: <7%Additional  Action Suggested:  >8%   07/31/2017 03:07 PM 5.6 4.6 - 6.5 % Final    Comment:    Glycemic Control Guidelines for People with Diabetes:Non Diabetic:  <6%Goal of Therapy: <7%Additional Action Suggested:  >8%     CBG: No results for input(s): GLUCAP in the last 168 hours.  Review of Systems:   Negative except as noted in HPI  Past Medical History  She,  has a past medical history of Cervical facet syndrome, Cervical post-laminectomy syndrome, Cervicalgia, Chronic pain syndrome, Hyperlipidemia, Hypertension, Osteoarthritis, localized, knee, and Osteopenia (06/2016 ).   Surgical History    Past Surgical History:  Procedure Laterality Date   CERVIAL FUSION  2006   Dr Arnoldo Morale, [place]   COLONOSCOPY  2011    Dr Fuller Plan   [facility]  09/03/13   L 5- S1 ; Dr Nelva Bush   JOINT REPLACEMENT  AUG 2012    [facility]  bilaterally; Dr Tonita Cong   TOTAL KNEE ARTHROPLASTY  01/11/2012   Procedure: TOTAL KNEE ARTHROPLASTY;  Surgeon: Johnn Hai, MD;  Location: [facility];  Service: Orthopedics;  Laterality: Left;  General with Femoral nerve block     Social History   reports that she has never smoked. She has never used smokeless tobacco. She reports current alcohol use of about 14.0 standard drinks of alcohol per week. She reports that she does not use drugs.   Family History   Her family history includes Breast cancer in her maternal aunt; Cancer (age of onset: 61) in her father; Diabetes in her maternal grandfather; Diverticulitis (age of onset: 80) in her mother; Heart disease in her maternal uncle; Heart failure in her maternal grandfather; Hypertension in her sister; Stroke in her maternal uncle.   Allergies No Known Allergies   Home Medications  Prior to Admission medications   Medication Sig Start Date End Date Taking? Authorizing Provider  alendronate (FOSAMAX) 70 MG tablet TAKE 1 TABLET BY MOUTH  EVERY 7 DAYS TAKE WITH A  FULL GLASS OF WATER ON AN  EMPTY  STOMACH Patient taking differently: Take 70 mg by mouth once a week.  10/10/19  Yes Burns, Claudina Lick, MD  Calcium Carbonate-Vitamin D (CALCIUM + D PO) Take 1 tablet by mouth daily.    Yes [provider]  fluticasone (FLONASE) 50 MCG/ACT nasal spray USE 1 SPRAY IN BOTH  NOSTRILS TWICE DAILY Patient taking differently: Place 1 spray into both nostrils in the morning and at bedtime.  06/30/20  Yes Burns, Claudina Lick, MD  hydrochlorothiazide (MICROZIDE) 12.5 MG capsule Take 1 capsule (12.5 mg total) by mouth daily. Annual appt due in March must see provider for future refills 08/04/20  Yes Burns, Claudina Lick, MD  HYDROcodone-acetaminophen (NORCO) 7.5-325 MG per tablet Take 1 tablet by mouth every 6 (six) hours as needed for moderate pain.  10/28/13  Yes [provider]  LYRICA 150 MG capsule Take 150 mg by mouth 2 (two) times daily.  06/12/16  Yes [provider]  meloxicam (MOBIC) 15 MG tablet Take 1 tablet by mouth daily.    Yes [provider]  metoprolol tartrate (LOPRESSOR) 50 MG tablet Take 0.5 tablets (25 mg total) by mouth 2 (two) times daily. Annual appt due in March must see provider for future refills 08/04/20  Yes Burns, Claudina Lick, MD  Multiple Vitamin (MULITIVITAMIN WITH MINERALS) TABS Take 1 tablet by mouth daily.    Yes [provider]  omeprazole (PRILOSEC) 20 MG capsule TAKE 1 CAPSULE BY MOUTH  DAILY Annual appt due in March must see provider for future refills Patient taking differently: Take 20 mg by mouth daily.  08/04/20  Yes Burns, Claudina Lick, MD  rosuvastatin (CRESTOR) 5 MG tablet Take 1 tablet (5 mg total) by mouth daily. Annual appt due in March must see provider for future refills 08/04/20  Yes Burns, Claudina Lick, MD  topiramate (TOPAMAX) 100 MG tablet Take 2 tablets by mouth 2 (two) times daily. 05/01/16  Yes [provider]  triamcinolone (NASACORT) 55 MCG/ACT AERO nasal inhaler Place 2 sprays into the nose daily. 03/10/20  Yes Burns, Claudina Lick, MD   triamcinolone cream (KENALOG) 0.5 % APPLY 1 APPLICATION  TOPICALLY 2 (TWO) TIMES  DAILY AS NEEDED. Patient taking differently: Apply 1 application topically 2 (two) times daily as needed (rash).  09/26/18  Yes Binnie Rail, MD     Critical care time:    Performed by:  Johnsie Cancel  Total critical care time: 40 minutes  Critical care time was exclusive of separately billable procedures and treating other patients.  Critical care was necessary to treat or prevent imminent or life-threatening deterioration.  Critical care was time spent personally by me on the following activities: development of treatment plan with patient and/or surrogate as well as nursing, discussions with consultants, evaluation of patient's response to treatment, examination of patient, obtaining history from patient or surrogate, ordering and performing treatments and interventions, ordering and review of laboratory studies, ordering and review of radiographic studies, pulse oximetry and re-evaluation of patient's condition.  Johnsie Cancel, NP-C [facility] Contact / Pager information can be found on Amion  11/08/2020, 5:18 PM

## 2020-11-08 NOTE — H&P (View-Only) (Signed)
Consult Note  Chloe Young [birth year]  [id number].    Requesting MD: Tomi Bamberger Chief Complaint/Reason for Consult: perforated viscus   HPI:  Patient is a 69 year old female who was brought in via EMS after near syncopal event and sliding off the couch. She was too weak to stand and her husband was unable to get her up. EMS found patient to be tachycardic and were concerned for possible STEMI. Cardiology saw patient on arrival and STEMI has been ruled out. Patient reported pain in abdomen started yesterday in upper abdomen and has been associated with nausea and vomiting. She denies dark or bloody stools. Denies chest pain. No sick contacts recently. Increased urination. Patient denies increase in NSAIDs, steroids. She does drink about 2 drinks nightly. No tobacco use.   PMH otherwise significant for recent fall 1 week ago with right shoulder injury which she has seen orthopedics for, HTN, HLD, chronic pain with hx of back surgeries. No past abdominal surgery in chart. NKDA.  ROS:  Review of Systems  Unable to perform ROS: Mental status change  Gastrointestinal: Positive for abdominal pain.  Musculoskeletal: Positive for joint pain (R shoulder pain ).    Family History  Problem Relation Age of Onset   Diverticulitis Mother 65       with fissures.Marland Kitchenopted for no surgery   Cancer Father 43        malignant brain tumor   Breast cancer Maternal Aunt    Diabetes Maternal Grandfather    Heart failure Maternal Grandfather    Stroke Maternal Uncle        in 48s   Heart disease Maternal Uncle        X4 ; 1 had MI @ 52   Hypertension Sister     Past Medical History:  Diagnosis Date   Cervical facet syndrome    Cervical post-laminectomy syndrome    Dr Tessa [facility]   Cervicalgia    Chronic pain syndrome    Hyperlipidemia    joint pain with statin   Hypertension    Osteoarthritis, localized, knee    OA AND PAIN LEFT KNEE  -- S/P RIGHT  KNEE ARTHROPLASTY ON  82012--KNEE IS DOING WELL.   Osteopenia 06/2016    T score -1.6 FRAX not calculated    Past Surgical History:  Procedure Laterality Date   CERVIAL FUSION  2006   Dr Arnoldo Morale, [place]   COLONOSCOPY  2011    Dr Fuller Plan   [facility]  09/03/13   L 5- S1 ; Dr Nelva Bush   JOINT REPLACEMENT  AUG 2012    RIGHT KNEE ARTHROPLASTY   KNEE ARTHROSCOPY     bilaterally; Dr Tonita Cong   TOTAL KNEE ARTHROPLASTY  01/11/2012   Procedure: TOTAL KNEE ARTHROPLASTY;  Surgeon: Johnn Hai, MD;  Location: [facility];  Service: Orthopedics;  Laterality: Left;  General with Femoral nerve block    Social History:  reports that she has never smoked. She has never used smokeless tobacco. She reports current alcohol use of about 14.0 standard drinks of alcohol per week. She reports that she does not use drugs.  Allergies: No Known Allergies  (Not in a hospital admission)   Blood pressure 123/76, pulse (!) 130, temperature (!) 103.1 F (39.5 C), temperature source Oral, resp. rate (!) 24, height 5\' 3"  (1.6 m), weight 59.9 kg, SpO2 95 %. Physical Exam:  General: pleasant, WD, WN female/female HEENT: head is normocephalic, atraumatic.  Sclera are noninjected.  PERRL.  Ears and nose without any masses or lesions.  Mouth is pink and moist Heart: sinus tachy in 130s.  Normal s1,s2. No obvious murmurs, gallops, or rubs noted.  Palpable radial and pedal pulses bilaterally Lungs: CTAB, no wheezes, rhonchi, or rales noted.  Respiratory effort nonlabored Abd: soft, generalized peritonitis with guarding and rebound tenderness, distended,  MS: RUE in shoulder sling Skin: warm and dry with no masses, lesions, or rashes Neuro: Cranial nerves 2-12 grossly intact, sensation is normal throughout Psych: seems mildly confused    Results for orders placed or performed during the hospital encounter of 11/08/20 (from the past 48 hour(s))  Comprehensive metabolic panel     Status: Abnormal   Collection Time: 11/08/20 12:36 PM  Result Value Ref  Range   Sodium 135 135 - 145 mmol/L   Potassium 3.6 3.5 - 5.1 mmol/L   Chloride 102 98 - 111 mmol/L   CO2 18 (L) 22 - 32 mmol/L   Glucose, Bld 157 (H) 70 - 99 mg/dL    Comment: Glucose reference range applies only to samples taken after fasting for at least 8 hours.   BUN 14 8 - 23 mg/dL   Creatinine, Ser 1.01 (H) 0.44 - 1.00 mg/dL   Calcium 8.6 (L) 8.9 - 10.3 mg/dL   Total Protein 7.3 6.5 - 8.1 g/dL   Albumin 3.5 3.5 - 5.0 g/dL   AST 54 (H) 15 - 41 U/L   ALT 34 0 - 44 U/L   Alkaline Phosphatase 40 38 - 126 U/L   Total Bilirubin 1.5 (H) 0.3 - 1.2 mg/dL   GFR, Estimated 60 (L) >60 mL/min    Comment: (NOTE) Calculated using the CKD-EPI Creatinine Equation (2021)    Anion gap 15 5 - 15    Comment: Performed at [facility], [place] [address], [place], [place] [address]  Lipase, blood     Status: None   Collection Time: 11/08/20 12:36 PM  Result Value Ref Range   Lipase 25 11 - 51 U/L    Comment: Performed at [facility], [place] [address] [place], [place] [address]  CBC with Diff     Status: Abnormal   Collection Time: 11/08/20 12:36 PM  Result Value Ref Range   WBC 9.0 4.0 - 10.5 K/uL   RBC 4.18 3.87 - 5.11 MIL/uL   Hemoglobin 13.2 12.0 - 15.0 g/dL   HCT 40.9 36 - 46 %   MCV 97.8 80.0 - 100.0 fL   MCH 31.6 26.0 - 34.0 pg   MCHC 32.3 30.0 - 36.0 g/dL   RDW 13.2 11.5 - 15.5 %   Platelets 273 150 - 400 K/uL   nRBC 0.0 0.0 - 0.2 %   Neutrophils Relative % 67 %   Neutro Abs 7.0 1.7 - 7.7 K/uL   Band Neutrophils 11 %   Lymphocytes Relative 15 %   Lymphs Abs 1.4 0.7 - 4.0 K/uL   Monocytes Relative 3 %   Monocytes Absolute 0.3 0.1 - 1.0 K/uL   Eosinophils Relative 0 %   Eosinophils Absolute 0.0 0.0 - 0.5 K/uL   Basophils Relative 0 %   Basophils Absolute 0.0 0.0 - 0.1 K/uL   WBC Morphology See Note     Comment: Vaculated Neutrophils  Mild Left Shift. 1 to 5% Metas and Myelos, Occ Pro Noted.    nRBC 0 0 /100 WBC   Metamyelocytes Relative 4 %   Abs Immature  Granulocytes 0.40 (H) 0.00 -  0.07 K/uL    Comment: Performed at [facility], [place] [address] [place], [place] [address]  Respiratory Panel by RT PCR (Flu A&B, Covid) - Nasopharyngeal Swab     Status: None   Collection Time: 11/08/20 12:36 PM   Specimen: Nasopharyngeal Swab  Result Value Ref Range   SARS Coronavirus 2 by RT PCR NEGATIVE NEGATIVE    Comment: (NOTE) SARS-CoV-2 target nucleic acids are NOT DETECTED.  The SARS-CoV-2 RNA is generally detectable in upper respiratoy specimens during the acute phase of infection. The lowest concentration of SARS-CoV-2 viral copies this assay can detect is 131 copies/mL. A negative result does not preclude SARS-Cov-2 infection and should not be used as the sole basis for treatment or other patient management decisions. A negative result may occur with  improper specimen collection/handling, submission of specimen other than nasopharyngeal swab, presence of viral mutation(s) within the areas targeted by this assay, and inadequate number of viral copies (<131 copies/mL). A negative result must be combined with clinical observations, patient history, and epidemiological information. The expected result is Negative.  Fact Sheet for Patients:  PinkCheek.be  Fact Sheet for Healthcare Providers:  GravelBags.it  This test is no t yet approved or cleared by the [place] FDA and  has been authorized for detection and/or diagnosis of SARS-CoV-2 by FDA under an Emergency Use Authorization (EUA). This EUA will remain  in effect (meaning this test can be used) for the duration of the COVID-19 declaration under Section 564(b)(1) of the Act, 21 U.S.C. section 360bbb-3(b)(1), unless the authorization is terminated or revoked sooner.     Influenza A by PCR NEGATIVE NEGATIVE   Influenza B by PCR NEGATIVE NEGATIVE    Comment: (NOTE) The Xpert Xpress SARS-CoV-2/FLU/RSV assay is intended  as an aid in  the diagnosis of influenza from Nasopharyngeal swab specimens and  should not be used as a sole basis for treatment. Nasal washings and  aspirates are unacceptable for Xpert Xpress SARS-CoV-2/FLU/RSV  testing.  Fact Sheet for Patients: PinkCheek.be  Fact Sheet for Healthcare Providers: GravelBags.it  This test is not yet approved or cleared by the [place] FDA and  has been authorized for detection and/or diagnosis of SARS-CoV-2 by  FDA under an Emergency Use Authorization (EUA). This EUA will remain  in effect (meaning this test can be used) for the duration of the  Covid-19 declaration under Section 564(b)(1) of the Act, 21  U.S.C. section 360bbb-3(b)(1), unless the authorization is  terminated or revoked. Performed at [facility], [place] [address], [place], [place] [address]   Troponin I (High Sensitivity)     Status: Abnormal   Collection Time: 11/08/20 12:36 PM  Result Value Ref Range   Troponin I (High Sensitivity) 34 (H) <18 ng/L    Comment: (NOTE) Elevated high sensitivity troponin I (hsTnI) values and significant  changes across serial measurements may suggest ACS but many other  chronic and acute conditions are known to elevate hsTnI results.  Refer to the "Links" section for chest pain algorithms and additional  guidance. Performed at [facility], [place] [address], [place],  [address]   Protime-INR     Status: Abnormal   Collection Time: 11/08/20 12:36 PM  Result Value Ref Range   Prothrombin Time 15.3 (H) 11.4 - 15.2 seconds   INR 1.3 (H) 0.8 - 1.2    Comment: (NOTE) INR goal varies based on device and disease states. Performed at [facility], [place]  [address], [place], [place] [address]   APTT     Status: None   Collection Time: 11/08/20 12:36 PM  Result Value Ref Range   aPTT 28 24 - 36 seconds    Comment: Performed at [facility] [address] [place], [place] [address]  Type and screen [place]     Status: None   Collection Time: 11/08/20 12:40 PM  Result Value Ref Range   ABO/RH(D) O POS    Antibody Screen NEG    Sample Expiration      11/11/2020,2359 Performed at [facility], [place] [address], [place], [place] [address]   Lactic acid, plasma     Status: Abnormal   Collection Time: 11/08/20 12:40 PM  Result Value Ref Range   Lactic Acid, Venous 3.3 (HH) 0.5 - 1.9 mmol/L    Comment: CRITICAL RESULT CALLED TO, READ BACK BY AND VERIFIED WITH: STRADER,A RN @ 9390 11/08/20 LEONARD,A Performed at [facility], [place] [address] [place], [place] [address]   I-stat chem 8, ED (not at [facility])     Status: Abnormal   Collection Time: 11/08/20 12:46 PM  Result Value Ref Range   Sodium 136 135 - 145 mmol/L   Potassium 3.5 3.5 - 5.1 mmol/L   Chloride 102 98 - 111 mmol/L   BUN 16 8 - 23 mg/dL   Creatinine, Ser 0.90 0.44 - 1.00 mg/dL   Glucose, Bld 155 (H) 70 - 99 mg/dL    Comment: Glucose reference range applies only to samples taken after fasting for at least 8 hours.   Calcium, Ion 1.05 (L) 1.15 - 1.40 mmol/L   TCO2 21 (L) 22 - 32 mmol/L   Hemoglobin 13.3 12.0 - 15.0 g/dL   HCT 39.0 36 - 46 %   CT ABDOMEN PELVIS W CONTRAST  Result Date: 11/08/2020 CLINICAL DATA:  69 year old female with history of abdominal pain. Fever. EXAM: CT ABDOMEN AND PELVIS WITH CONTRAST TECHNIQUE: Multidetector CT imaging of the abdomen and pelvis was performed using the standard protocol following bolus administration of intravenous contrast. CONTRAST:  138mL OMNIPAQUE IOHEXOL 300 MG/ML  SOLN COMPARISON:  No priors. FINDINGS: Lower chest: Subsegmental atelectasis noted in the lower lobes of the lungs bilaterally. Hepatobiliary: No suspicious cystic or solid hepatic lesions. No intra or extrahepatic biliary ductal dilatation. Gallbladder is unremarkable in appearance. Pancreas: No pancreatic mass. No pancreatic ductal  dilatation. No pancreatic or peripancreatic fluid collections or inflammatory changes. Spleen: Unremarkable. Adrenals/Urinary Tract: Bilateral kidneys and adrenal glands are normal in appearance. No hydroureteronephrosis. Urinary bladder is normal in appearance. Stomach/Bowel: Stomach is unremarkable in appearance. There are numerous dilated loops of proximal to mid small bowel, with gradual transition to nondilated loops of mid to distal small bowel. The exact transition point is difficult to localize, however, in the low anatomic pelvis there are several loops of thickened small bowel which demonstrate associated mucosal hyperenhancement, with some surrounding fluid and small amount of extraluminal gas, concerning for regional enteritis affecting predominantly the mid to distal jejunum and/or proximal ileum. Terminal ileum is decompressed, but otherwise grossly unremarkable in appearance. Appendix is normal. Colon is relatively decompressed. There are numerous colonic diverticuli, particularly in the sigmoid colon. There are some mild inflammatory changes in the sigmoid colon, without definitive focal inflammatory changes to clearly indicate an acute diverticulitis at this time. Vascular/Lymphatic: Aortic atherosclerosis without evidence of aneurysm or dissection in the abdominal or pelvic vasculature. No lymphadenopathy noted in  the abdomen or pelvis. Reproductive: Uterus and ovaries are unremarkable in appearance. Other: Small volume of ascites most evident in the central small bowel mesentery adjacent to the inflamed loops of small bowel. Small amount of pneumoperitoneum, also most evident in the small bowel mesentery adjacent to the inflamed loops of small bowel, and in the upper abdomen adjacent to the liver. Musculoskeletal: There are no aggressive appearing lytic or blastic lesions noted in the visualized portions of the skeleton. IMPRESSION: 1. Findings are compatible with an acute bowel perforation. The  exact site of perforation is uncertain, however, the favored candidate is the mid small bowel (likely distal jejunum or proximal ileum) where there are multiple loops of thickened and inflamed small bowel with adjacent fluid and gas in the small bowel mesentery. Associated with this there is a partial small bowel obstruction, as above. 2. There is also colonic diverticulosis. There are some subtle inflammatory changes in the sigmoid mesocolon. The possibility of an acute diverticulitis is not excluded, but not strongly favored on the basis of today's examination; however, direct inspection at time of potential laparoscopy is suggested as this could alternatively represent this site of perforation. 3. Aortic atherosclerosis. Critical Value/emergent results were called by telephone at the time of interpretation on 11/08/2020 at 3:09 pm to provider [facility], who verbally acknowledged these results. Electronically Signed   By: Vinnie Langton M.D.   On: 11/08/2020 15:10   DG Chest Portable 1 View  Result Date: 11/08/2020 CLINICAL DATA:  Right-sided chest pain EXAM: PORTABLE CHEST 1 VIEW COMPARISON:  07/24/2011 FINDINGS: The heart size and mediastinal contours are within normal limits. Lung volumes with mild bibasilar atelectasis. No pleural effusion or pneumothorax. There is an acute fracture of the proximal right humerus with transverse component involving the surgical neck as well as a nondisplaced component involving the greater tuberosity. Fracture is mildly displaced and angulated. Glenohumeral joint alignment appears maintained without dislocation. IMPRESSION: 1. Acute fracture of the proximal right humerus. 2. Low lung volumes with mild bibasilar atelectasis. Electronically Signed   By: Davina Poke D.O.   On: 11/08/2020 13:03      Assessment/Plan Perforated viscus - unsure whether it is small bowel vs stomach in etiology - to OR this evening with Dr. Barry Dienes for exploration - medicine to admit  and we will follow post-operatively  - discussed with son and husband over the phone   Norm Parcel, [facility] Surgery 11/08/2020, 4:10 PM Please see Amion for pager number during day hours 7:00am-4:30pm

## 2020-11-08 NOTE — ED Notes (Signed)
NG Tube instered. X-Ray has been called for placement verification.

## 2020-11-08 NOTE — Progress Notes (Signed)
PCCM Brief Progress Note  S: 69 yo F with perforated diverticulitis POD 0 sigmoid colectomy and end colostomy. Extubated at end of case. Taken to PACU then ICU.  Seen in ICU post-operatively  O: 97% on room air RR 19 HR 92 BP 115/55  Tired, ill appearing elderly F reclined NAD CTAb, Even and unlabored RRR s1s2 Abdomen round and mildly tender No cyanosis or clubbing  Clear yellow urine  A:  Sepsis due to perforated diverticulitis Hypotension -- sepsis vs hypovolemia   P: -BCx -zosyn  -MAP goal > 65  -Cont IVF -- clinically seems dry -Labs to attempt draw of BMP CBC LA after IVF  -NPO -NGT to LIWS  -WOC  -PRN zofran, morphine   Anticipate likely transfer out of ICU in AM if remains off pressors     Eliseo Gum MSN, AGACNP-BC [place] [phone] If no answer, [phone] 11/08/2020, 10:53 PM

## 2020-11-08 NOTE — Progress Notes (Signed)
[place] Progress Note Patient Name: Chloe Young DOB: [birth year] MRN: [id number]   Date of Service  11/08/2020  HPI/Events of Note  Hypotension - BP soft = 103/53 with MAP = 68 s/p ex lap for perforated bowel. No CVL or CVP. LVEF = 55-60%. Last Lactic Acid = 2.9.  eICU Interventions  Plan: 1. Bolus with 0.9 NaCl 1 liter IV over 1 hour now.      Intervention Category Major Interventions: Hypotension - evaluation and management  Lysle Dingwall 11/08/2020, 10:37 PM

## 2020-11-08 NOTE — ED Notes (Signed)
General Surgeons currently at bedside.

## 2020-11-08 NOTE — ED Notes (Signed)
Patient transported to CT 

## 2020-11-08 NOTE — ED Provider Notes (Signed)
[facility] EMERGENCY DEPARTMENT Provider Note   CSN: [id number] Arrival date & time: 11/08/20  1221     History Chief Complaint  Patient presents with   Abdominal Pain   Near Syncope    Chloe Young is a 69 y.o. female.  HPI   Patient previously had a fall about a week ago and she injured her shoulder.  Patient was diagnosed with what sounds like a fracture to her proximal humerus region.  Patient was placed in the sling.  She is scheduled to follow-up with orthopedics.  Patient states last night she started having difficulty with nausea vomiting and abdominal pain.  The pain was primarily in her upper abdomen.  Patient was sitting on the couch this morning when she suddenly felt weak and slid off the couch onto the ground.  She landed on her buttock.  Patient was too weak to stand up and her husband was unable to assist her up.  EMS was called.  Patient was notably tachycardic on arrival.  EMS was concerned about the EKG and activated a code STEMI.  Patient was brought to the ED and was immediately seen on arrival by cardiology.  Presentation was not felt to be consistent with a code STEMI and the patient was brought back to an ED bed.  Patient denies having any blood in her stool.  She does feel like she has been urinating more frequently.  She is not having any pain in her chest now but does have continued pain in her upper abdomen.  Patient denies any prior history of abdominal issues.  She does drink alcohol regularly.  About 2 drinks per night.  No known history of pancreas problems.  Past Medical History:  Diagnosis Date   Cervical facet syndrome    Cervical post-laminectomy syndrome    Dr Tessa Lerner[facility]   Cervicalgia    Chronic pain syndrome    Hyperlipidemia    joint pain with statin   Hypertension    Osteoarthritis, localized, knee    OA AND PAIN LEFT KNEE  -- S/P RIGHT  KNEE ARTHROPLASTY ON 82012--KNEE IS DOING WELL.   Osteopenia  06/2016    T score -1.6 FRAX not calculated    Patient Active Problem List   Diagnosis Date Noted   Chronic nasal congestion 03/10/2020   Lumbar radiculopathy 06/15/2016   Cervicogenic headache 06/15/2016   Osteopenia 06/15/2016   GERD (gastroesophageal reflux disease) 06/15/2016   Fasting hyperglycemia 11/25/2013   Nonspecific abnormal electrocardiogram (ECG) (EKG) 05/28/2012   Cervical post-laminectomy syndrome 03/18/2012   Osteoarthritis of both knees 03/18/2012   RAYNAUD'S SYNDROME 10/11/2010   NECK PAIN, CHRONIC 04/19/2010   Hyperlipidemia 04/19/2009   Essential hypertension 04/19/2009    Past Surgical History:  Procedure Laterality Date   CERVIAL FUSION  2006   Dr Arnoldo Morale, [place]   COLONOSCOPY  2011    Dr Fuller Plan   [facility]  09/03/13   L 5- S1 ; Dr Nelva Bush   JOINT REPLACEMENT  AUG 2012    RIGHT KNEE ARTHROPLASTY   KNEE ARTHROSCOPY     bilaterally; Dr Tonita Cong   TOTAL KNEE ARTHROPLASTY  01/11/2012   Procedure: TOTAL KNEE ARTHROPLASTY;  Surgeon: Johnn Hai, MD;  Location: [facility];  Service: Orthopedics;  Laterality: Left;  General with Femoral nerve block     OB History    Gravida  1   Para  1   Term      Preterm  AB      Living  1     SAB      TAB      Ectopic      Multiple      Live Births              Family History  Problem Relation Age of Onset   Diverticulitis Mother 93       with fissures.Marland Kitchenopted for no surgery   Cancer Father 51        malignant brain tumor   Breast cancer Maternal Aunt    Diabetes Maternal Grandfather    Heart failure Maternal Grandfather    Stroke Maternal Uncle        in 48s   Heart disease Maternal Uncle        X4 ; 1 had MI @ 70   Hypertension Sister     Social History   Tobacco Use   Smoking status: Never Smoker   Smokeless tobacco: Never Used  Substance Use Topics   Alcohol use: Yes    Alcohol/week: 14.0 standard drinks    Types: 14 Shots of liquor per week    Comment: 2  DRINKS PER NIGHT   Drug use: No    Home Medications Prior to Admission medications   Medication Sig Start Date End Date Taking? Authorizing Provider  alendronate (FOSAMAX) 70 MG tablet TAKE 1 TABLET BY MOUTH  EVERY 7 DAYS TAKE WITH A  FULL GLASS OF WATER ON AN  EMPTY STOMACH Patient taking differently: Take 70 mg by mouth once a week.  10/10/19  Yes Burns, Claudina Lick, MD  Calcium Carbonate-Vitamin D (CALCIUM + D PO) Take 1 tablet by mouth daily.    Yes [provider]  fluticasone (FLONASE) 50 MCG/ACT nasal spray USE 1 SPRAY IN BOTH  NOSTRILS TWICE DAILY Patient taking differently: Place 1 spray into both nostrils in the morning and at bedtime.  06/30/20  Yes Burns, Claudina Lick, MD  hydrochlorothiazide (MICROZIDE) 12.5 MG capsule Take 1 capsule (12.5 mg total) by mouth daily. Annual appt due in March must see provider for future refills 08/04/20  Yes Burns, Claudina Lick, MD  HYDROcodone-acetaminophen (NORCO) 7.5-325 MG per tablet Take 1 tablet by mouth every 6 (six) hours as needed for moderate pain.  10/28/13  Yes [provider]  LYRICA 150 MG capsule Take 150 mg by mouth 2 (two) times daily.  06/12/16  Yes [provider]  meloxicam (MOBIC) 15 MG tablet Take 1 tablet by mouth daily.    Yes [provider]  metoprolol tartrate (LOPRESSOR) 50 MG tablet Take 0.5 tablets (25 mg total) by mouth 2 (two) times daily. Annual appt due in March must see provider for future refills 08/04/20  Yes Burns, Claudina Lick, MD  Multiple Vitamin (MULITIVITAMIN WITH MINERALS) TABS Take 1 tablet by mouth daily.    Yes [provider]  omeprazole (PRILOSEC) 20 MG capsule TAKE 1 CAPSULE BY MOUTH  DAILY Annual appt due in March must see provider for future refills Patient taking differently: Take 20 mg by mouth daily.  08/04/20  Yes Burns, Claudina Lick, MD  rosuvastatin (CRESTOR) 5 MG tablet Take 1 tablet (5 mg total) by mouth daily. Annual appt due in March must see provider for future refills  08/04/20  Yes Burns, Claudina Lick, MD  topiramate (TOPAMAX) 100 MG tablet Take 2 tablets by mouth 2 (two) times daily. 05/01/16  Yes [provider]  triamcinolone (NASACORT) 55 MCG/ACT AERO nasal inhaler  Place 2 sprays into the nose daily. 03/10/20  Yes Burns, Claudina Lick, MD  triamcinolone cream (KENALOG) 0.5 % APPLY 1 APPLICATION  TOPICALLY 2 (TWO) TIMES  DAILY AS NEEDED. Patient taking differently: Apply 1 application topically 2 (two) times daily as needed (rash).  09/26/18  Yes Binnie Rail, MD    Allergies    Patient has no known allergies.  Review of Systems   Review of Systems  All other systems reviewed and are negative.   Physical Exam Updated Vital Signs BP 123/76 (BP Location: Left Arm)    Pulse (!) 130    Temp (!) 103.1 F (39.5 C) (Oral)    Resp (!) 24    Ht 1.6 m (5\' 3" )    Wt 59.9 kg    SpO2 95%    BMI 23.38 kg/m   Physical Exam Vitals and nursing note reviewed.  Constitutional:      Appearance: She is ill-appearing.  HENT:     Head: Normocephalic and atraumatic.     Right Ear: External ear normal.     Left Ear: External ear normal.  Eyes:     General: No scleral icterus.       Right eye: No discharge.        Left eye: No discharge.     Conjunctiva/sclera: Conjunctivae normal.  Neck:     Trachea: No tracheal deviation.  Cardiovascular:     Rate and Rhythm: Normal rate and regular rhythm.  Pulmonary:     Effort: Pulmonary effort is normal. No respiratory distress.     Breath sounds: Normal breath sounds. No stridor. No wheezing or rales.  Abdominal:     General: Bowel sounds are normal. There is no distension or abdominal bruit.     Palpations: Abdomen is soft. There is no mass.     Tenderness: There is abdominal tenderness in the epigastric area. There is no guarding or rebound.     Comments: No pulsatile mass   Musculoskeletal:        General: No tenderness.     Cervical back: Neck supple.  Skin:    General: Skin is warm and dry.     Findings: No  rash.  Neurological:     Mental Status: She is alert.     Cranial Nerves: No cranial nerve deficit (no facial droop, extraocular movements intact, no slurred speech).     Sensory: No sensory deficit.     Motor: No abnormal muscle tone or seizure activity.     Coordination: Coordination normal.     ED Results / Procedures / Treatments   Labs (all labs ordered are listed, but only abnormal results are displayed) Labs Reviewed  COMPREHENSIVE METABOLIC PANEL - Abnormal; Notable for the following components:      Result Value   CO2 18 (*)    Glucose, Bld 157 (*)    Creatinine, Ser 1.01 (*)    Calcium 8.6 (*)    AST 54 (*)    Total Bilirubin 1.5 (*)    GFR, Estimated 60 (*)    All other components within normal limits  CBC WITH DIFFERENTIAL/PLATELET - Abnormal; Notable for the following components:   Abs Immature Granulocytes 0.40 (*)    All other components within normal limits  PROTIME-INR - Abnormal; Notable for the following components:   Prothrombin Time 15.3 (*)    INR 1.3 (*)    All other components within normal limits  LACTIC ACID, PLASMA - Abnormal; Notable for the  following components:   Lactic Acid, Venous 3.3 (*)    All other components within normal limits  I-STAT CHEM 8, ED - Abnormal; Notable for the following components:   Glucose, Bld 155 (*)    Calcium, Ion 1.05 (*)    TCO2 21 (*)    All other components within normal limits  TROPONIN I (HIGH SENSITIVITY) - Abnormal; Notable for the following components:   Troponin I (High Sensitivity) 34 (*)    All other components within normal limits  RESPIRATORY PANEL BY RT PCR (FLU A&B, COVID)  CULTURE, BLOOD (ROUTINE X 2)  CULTURE, BLOOD (ROUTINE X 2)  URINE CULTURE  LIPASE, BLOOD  APTT  URINALYSIS, ROUTINE W REFLEX MICROSCOPIC  ETHANOL  LACTIC ACID, PLASMA  TYPE AND SCREEN  TROPONIN I (HIGH SENSITIVITY)    EKG EKG Interpretation  Date/Time:  Monday November 08 2020 12:29:54 EST Ventricular Rate:  133 PR  Interval:    QRS Duration: 91 QT Interval:  334 QTC Calculation: 497 R Axis:   70 Text Interpretation: Sinus tachycardia Prolonged PR interval Consider left atrial enlargement Probable anterolateral infarct, recent Abnormal T, consider ischemia, inferior leads Since last tracing rate faster Confirmed by Dorie Rank [phone]) on 11/08/2020 12:51:49 PM   Radiology CT ABDOMEN PELVIS W CONTRAST  Result Date: 11/08/2020 CLINICAL DATA:  69 year old female with history of abdominal pain. Fever. EXAM: CT ABDOMEN AND PELVIS WITH CONTRAST TECHNIQUE: Multidetector CT imaging of the abdomen and pelvis was performed using the standard protocol following bolus administration of intravenous contrast. CONTRAST:  155mL OMNIPAQUE IOHEXOL 300 MG/ML  SOLN COMPARISON:  No priors. FINDINGS: Lower chest: Subsegmental atelectasis noted in the lower lobes of the lungs bilaterally. Hepatobiliary: No suspicious cystic or solid hepatic lesions. No intra or extrahepatic biliary ductal dilatation. Gallbladder is unremarkable in appearance. Pancreas: No pancreatic mass. No pancreatic ductal dilatation. No pancreatic or peripancreatic fluid collections or inflammatory changes. Spleen: Unremarkable. Adrenals/Urinary Tract: Bilateral kidneys and adrenal glands are normal in appearance. No hydroureteronephrosis. Urinary bladder is normal in appearance. Stomach/Bowel: Stomach is unremarkable in appearance. There are numerous dilated loops of proximal to mid small bowel, with gradual transition to nondilated loops of mid to distal small bowel. The exact transition point is difficult to localize, however, in the low anatomic pelvis there are several loops of thickened small bowel which demonstrate associated mucosal hyperenhancement, with some surrounding fluid and small amount of extraluminal gas, concerning for regional enteritis affecting predominantly the mid to distal jejunum and/or proximal ileum. Terminal ileum is decompressed, but  otherwise grossly unremarkable in appearance. Appendix is normal. Colon is relatively decompressed. There are numerous colonic diverticuli, particularly in the sigmoid colon. There are some mild inflammatory changes in the sigmoid colon, without definitive focal inflammatory changes to clearly indicate an acute diverticulitis at this time. Vascular/Lymphatic: Aortic atherosclerosis without evidence of aneurysm or dissection in the abdominal or pelvic vasculature. No lymphadenopathy noted in the abdomen or pelvis. Reproductive: Uterus and ovaries are unremarkable in appearance. Other: Small volume of ascites most evident in the central small bowel mesentery adjacent to the inflamed loops of small bowel. Small amount of pneumoperitoneum, also most evident in the small bowel mesentery adjacent to the inflamed loops of small bowel, and in the upper abdomen adjacent to the liver. Musculoskeletal: There are no aggressive appearing lytic or blastic lesions noted in the visualized portions of the skeleton. IMPRESSION: 1. Findings are compatible with an acute bowel perforation. The exact site of perforation is uncertain, however, the favored candidate  is the mid small bowel (likely distal jejunum or proximal ileum) where there are multiple loops of thickened and inflamed small bowel with adjacent fluid and gas in the small bowel mesentery. Associated with this there is a partial small bowel obstruction, as above. 2. There is also colonic diverticulosis. There are some subtle inflammatory changes in the sigmoid mesocolon. The possibility of an acute diverticulitis is not excluded, but not strongly favored on the basis of today's examination; however, direct inspection at time of potential laparoscopy is suggested as this could alternatively represent this site of perforation. 3. Aortic atherosclerosis. Critical Value/emergent results were called by telephone at the time of interpretation on 11/08/2020 at 3:09 pm to provider  [facility], who verbally acknowledged these results. Electronically Signed   By: Vinnie Langton M.D.   On: 11/08/2020 15:10   DG Chest Portable 1 View  Result Date: 11/08/2020 CLINICAL DATA:  Right-sided chest pain EXAM: PORTABLE CHEST 1 VIEW COMPARISON:  07/24/2011 FINDINGS: The heart size and mediastinal contours are within normal limits. Lung volumes with mild bibasilar atelectasis. No pleural effusion or pneumothorax. There is an acute fracture of the proximal right humerus with transverse component involving the surgical neck as well as a nondisplaced component involving the greater tuberosity. Fracture is mildly displaced and angulated. Glenohumeral joint alignment appears maintained without dislocation. IMPRESSION: 1. Acute fracture of the proximal right humerus. 2. Low lung volumes with mild bibasilar atelectasis. Electronically Signed   By: Davina Poke D.O.   On: 11/08/2020 13:03    Procedures .Critical Care Performed by: Dorie Rank, MD Authorized by: Dorie Rank, MD   Critical care provider statement:    Critical care time (minutes):  50   Critical care was time spent personally by me on the following activities:  Discussions with consultants, evaluation of patient's response to treatment, examination of patient, ordering and performing treatments and interventions, ordering and review of laboratory studies, ordering and review of radiographic studies, pulse oximetry, re-evaluation of patient's condition, obtaining history from patient or surrogate and review of old charts   (including critical care time)  Medications Ordered in ED Medications  lactated ringers infusion (has no administration in time range)  lactated ringers bolus 1,000 mL (0 mLs Intravenous Stopped 11/08/20 1352)    And  lactated ringers bolus 1,000 mL (0 mLs Intravenous Stopped 11/08/20 1526)    And  lactated ringers bolus 250 mL (250 mLs Intravenous New Bag/Given 11/08/20 1553)  ceFEPIme (MAXIPIME) 2 g in  sodium chloride 0.9 % 100 mL IVPB (has no administration in time range)  acetaminophen (TYLENOL) tablet 650 mg (has no administration in time range)  fentaNYL (SUBLIMAZE) injection 50 mcg (50 mcg Intravenous Given 11/08/20 1356)  ceFEPIme (MAXIPIME) 2 g in sodium chloride 0.9 % 100 mL IVPB (0 g Intravenous Stopped 11/08/20 1338)  iohexol (OMNIPAQUE) 300 MG/ML solution 100 mL (100 mLs Intravenous Contrast Given 11/08/20 1421)  ondansetron (ZOFRAN) injection 4 mg (4 mg Intravenous Given 11/08/20 1542)  lactated ringers bolus 1,000 mL (0 mLs Intravenous Stopped 11/08/20 1554)    ED Course  I have reviewed the triage vital signs and the nursing notes.  Pertinent labs & imaging results that were available during my care of the patient were reviewed by me and considered in my medical decision making (see chart for details).  Clinical Course as of Nov 09 1555  Mon Nov 08, 2020  1243 Notified pt has a fever and BP is low.  Will initiate sepsis protocol.  Initially  concerned about possible AAA.  Will plan on CT but more likely infection, sepsis at this time   [JK]  1343 Repeat BP improved.  Now in the 120s.  Lactic acid elevated.   [BZ]  1696 D/w Radiology.  Suspicious for small bowel obstruction, thickened walls and also fluid and gas.  Concerning for perforation.  Sigmoid also with diverticuli.  ?perforated sigmoid diverticulitis but less likely   [VE]  9381 Patient's blood pressure is trending downward.  Will give additional fluid bolus.  Will consult with critical care service   [JK]  1555 Discussed with General surgery, Dr Roosevelt Locks hospitalist and critical care service.   [JK]    Clinical Course User Index [JK] Dorie Rank, MD   MDM Rules/Calculators/A&P                         DDX: Initially concerned about the possibility of abdominal aortic aneurysm.  Initial vitals however show a fever and decreased blood pressure.  More concerned about sepsis and infectious etiology/sepsis at this point.   Pancreatitis, cholecystitis, cholangitis, cholitis, pyelo, diverticulitis, perforated viscus  Patient's ED work-up is consistent with sepsis.  She has fever and an elevated lactic acid level.  Patient also had tachycardia and hypotension.  Blood pressure has improved although she has had some transient low blood pressure still.  CT scan shows evidence of bowel perforation and obstruction.  NG tube ordered. I have consulted with general surgery critical care and the hospitalist service.   Final Clinical Impression(s) / ED Diagnoses Final diagnoses:  Perforated abdominal viscus  Sepsis, due to unspecified organism, unspecified whether acute organ dysfunction present [facility])  Partial intestinal obstruction, unspecified cause ([place])      Dorie Rank, MD 11/08/20 1557

## 2020-11-08 NOTE — Progress Notes (Signed)
Following for code sepsis 

## 2020-11-08 NOTE — Consult Note (Addendum)
Cardiology Consultation:   Patient ID: Chloe Young MRN: [id number]; DOB: 1951-09-21  Admit date: 11/08/2020 Date of Consult: 11/08/2020  Primary Care Provider: Binnie Rail, MD [facility] Cardiologist: Lauree Chandler, MD [place] Electrophysiologist:  None    Patient Profile:   Chloe Young is a 69 y.o. female with a hx of hypertension and osteoarthritis, who is being seen today for the evaluation of abnormal EKD at the request of Dr. Tomi Bamberger.  History of Present Illness:   [name] presents to the ED via EMS today.  We were asked to evaluate her for possible STEMI.  She reports becoming very weak earlier today and sliding off of her couch onto the floor.  She landed on her buttocks and did not injure herself.  However, she was unable to get up on her own.  Her husband was also unable to help her back up, prompting him to call EMS.  [name] notes that she had fallen a week ago and was evaluated by an outpatient provider.  She reports that was a mechanical fall.  Yesterday, she began to have recurrent nausea, diarrhea, and vomiting as well as epigastric abdominal pain radiating to the lower abdomen.  She has felt a little short of breath at times but denies chest pain as well as palpitations and syncope.  When EMS arrived, they found [name] to be quite tachycardic with heart rates near 140 bpm.  EMS EKGs showed baseline artifact with inferior ST segment elevation.  Other than palpitations for which she saw Dr. Angelena Form in 2013, [name] denies history of prior cardiac disease.  Echocardiogram at that time showed normal LVEF.  She has never had a heart catheterization.   Past Medical History:  Diagnosis Date  . Cervical facet syndrome   . Cervical post-laminectomy syndrome    Dr Tessa Ler[facility]  . Cervicalgia   . Chronic pain syndrome   . Hyperlipidemia    joint pain with statin  . Hypertension   . Osteoarthritis, localized, knee    OA AND PAIN LEFT  KNEE  -- S/P RIGHT  KNEE ARTHROPLASTY ON 82012--KNEE IS DOING WELL.  . Osteopenia 06/2016    T score -1.6 FRAX not calculated    Past Surgical History:  Procedure Laterality Date  . CERVIAL FUSION  2006   Dr Arnoldo Morale, [place]  . COLONOSCOPY  2011    Dr Fuller Plan  . ESI  09/03/13   L 5- S1 ; Dr Nelva Bush  . JOINT REPLACEMENT  AUG 2012    RIGHT KNEE ARTHROPLASTY  . KNEE ARTHROSCOPY     bilaterally; Dr Tonita Cong  . TOTAL KNEE ARTHROPLASTY  01/11/2012   Procedure: TOTAL KNEE ARTHROPLASTY;  Surgeon: Johnn Hai, MD;  Location: [facility];  Service: Orthopedics;  Laterality: Left;  General with Femoral nerve block     Home Medications:  Prior to Admission medications   Medication Sig Start Date Dacen Frayre Date Taking? Authorizing Provider  alendronate (FOSAMAX) 70 MG tablet TAKE 1 TABLET BY MOUTH  EVERY 7 DAYS TAKE WITH A  FULL GLASS OF WATER ON AN  EMPTY STOMACH 10/10/19   Burns, Claudina Lick, MD  Calcium Carbonate-Vitamin D (CALCIUM + D PO) Take 1 tablet by mouth daily.     [provider]  fluticasone (FLONASE) 50 MCG/ACT nasal spray USE 1 SPRAY IN BOTH  NOSTRILS TWICE DAILY 06/30/20   Binnie Rail, MD  hydrochlorothiazide (MICROZIDE) 12.5 MG capsule Take 1 capsule (12.5 mg total) by  mouth daily. Annual appt due in March must see provider for future refills 08/04/20   Binnie Rail, MD  HYDROcodone-acetaminophen (NORCO) 7.5-325 MG per tablet Take 1 tablet by mouth every 6 (six) hours as needed. 10/28/13   [provider]  LYRICA 150 MG capsule Take 150 mg by mouth 2 (two) times daily.  06/12/16   [provider]  meloxicam (MOBIC) 15 MG tablet Take 1 tablet by mouth daily.     [provider]  metoprolol tartrate (LOPRESSOR) 50 MG tablet Take 0.5 tablets (25 mg total) by mouth 2 (two) times daily. Annual appt due in March must see provider for future refills 08/04/20   Binnie Rail, MD  Multiple Vitamin (MULITIVITAMIN WITH MINERALS) TABS Take 1 tablet by mouth daily.     [provider]  omeprazole (PRILOSEC) 20 MG capsule TAKE 1 CAPSULE BY MOUTH  DAILY Annual appt due in March must see provider for future refills 08/04/20   Binnie Rail, MD  rosuvastatin (CRESTOR) 5 MG tablet Take 1 tablet (5 mg total) by mouth daily. Annual appt due in March must see provider for future refills 08/04/20   Binnie Rail, MD  topiramate (TOPAMAX) 100 MG tablet Take 2 tablets by mouth 2 (two) times daily. 05/01/16   [provider]  triamcinolone (NASACORT) 55 MCG/ACT AERO nasal inhaler Place 2 sprays into the nose daily. 03/10/20   Burns, Claudina Lick, MD  triamcinolone cream (KENALOG) 0.5 % APPLY 1 APPLICATION  TOPICALLY 2 (TWO) TIMES  DAILY AS NEEDED. Patient not taking: Reported on 05/13/2020 09/26/18   Binnie Rail, MD    Inpatient Medications: Scheduled Meds: . fentaNYL (SUBLIMAZE) injection  50 mcg Intravenous Once   Continuous Infusions: . ceFEPime (MAXIPIME) IV 2 g (11/08/20 1300)  . lactated ringers 1,000 mL (11/08/20 1250)   And  . lactated ringers     And  . lactated ringers    . lactated ringers     PRN Meds:   Allergies:   No Known Allergies  Social History:   Social History   Tobacco Use  . Smoking status: Never Smoker  . Smokeless tobacco: Never Used  Substance Use Topics  . Alcohol use: Yes    Alcohol/week: 14.0 standard drinks    Types: 14 Shots of liquor per week    Comment: 2 DRINKS PER NIGHT  . Drug use: No    Family History:   Family History  Problem Relation Age of Onset  . Diverticulitis Mother 50       with fissures.Marland Kitchenopted for no surgery  . Cancer Father 72        malignant brain tumor  . Breast cancer Maternal Aunt   . Diabetes Maternal Grandfather   . Heart failure Maternal Grandfather   . Stroke Maternal Uncle        in 50s  . Heart disease Maternal Uncle        X4 ; 1 had MI @ 24  . Hypertension Sister      ROS:  Review of Systems  Unable to perform ROS: Acuity of condition   Physical Exam/Data:   Vitals:    11/08/20 1250 11/08/20 1251 11/08/20 1252 11/08/20 1253  BP:      Pulse: (!) 137 (!) 139 (!) 136 (!) 137  Resp: (!) 27 (!) 26 (!) 25 (!) 24  Temp:    (!) 102.6 F (39.2 C)  TempSrc:    Oral  SpO2: 95% 95%  95% 94%   No intake or output data in the 24 hours ending 11/08/20 1302 Last 3 Weights 03/10/2020 08/08/2018 11/16/2017  Weight (lbs) 145 lb 146 lb 149 lb  Weight (kg) 65.772 kg 66.225 kg 67.586 kg     There is no height or weight on file to calculate BMI.  General:  Well nourished, well developed, in no acute distress HEENT: normal Lymph: no adenopathy Neck: Rigid cervical spine collar in place.  No gross JVD, though evaluation is limited. Endocrine:  No thryomegaly Vascular: 1+ radial pulses bilaterally. Cardiac: Tachycardic but regular without murmurs, rubs, or gallops. Lungs:  clear to auscultation bilaterally anteriorly. Abd: Soft but mildly distended with diffuse tenderness and guarding, most pronounced in the epigastrium. Ext: no edema Musculoskeletal: No deformities noted.  Right arm is in a sling. Skin: Cool to touch. Neuro:  CNs 2-12 intact, no focal abnormalities noted Psych:  Normal affect   EKG:  The EKG was personally reviewed and demonstrates:  Sinus tachycardia with nonspecific ST segment changes.  Relevant CV Studies: Echo (07/16/2012): Normal LV size and wall thickness.  LVEF 55-60% with grade 1 diastolic dysfunction.  Mildly dilated ascending aorta.  Normal RV size and function.  No significant valvular abnormality.   Laboratory Data:  High Sensitivity Troponin:  No results for input(s): TROPONINIHS in the last 720 hours.   Chemistry Recent Labs  Lab 11/08/20 1246  NA 136  K 3.5  CL 102  GLUCOSE 155*  BUN 16  CREATININE 0.90    No results for input(s): PROT, ALBUMIN, AST, ALT, ALKPHOS, BILITOT in the last 168 hours. Hematology Recent Labs  Lab 11/08/20 1246  HGB 13.3  HCT 39.0   BNPNo results for input(s): BNP, PROBNP in the last 168 hours.   DDimer No results for input(s): DDIMER in the last 168 hours.   Radiology/Studies:  No results found.   Assessment and Plan:   Abnormal EKG: Patient presents with weakness and abdominal pain.  EMS EKGs were concerning for inferior STEMI, though repeat tracing here does not show any ST elevation.  Additionally, she has not had any chest pain.  I am more concerned about her sinus tachycardia, recurrent falls, and abdominal pain accompanied by nausea and vomiting.  I do not recommend emergent cardiac catheterization at this time.  I will defer ongoing work-up of other potential etiologies for the patient's weakness and tachycardia to the emergency department team.  High-sensitivity troponin I can be trended.  Sinus tachycardia: Most likely compensatory.  I do not see clear evidence of atrial fibrillation/flutter or SVT.  Given soft blood pressure, an element of dehydration in the setting of nausea vomiting and diarrhea is certainly a concern.  Labs including CBC and chemistries should be obtained.  Cross-sectional imaging of the abdomen and pelvis may also be helpful.  I will defer this to Dr. Tomi Bamberger in the ED team.  Falls: The seem to be mechanical and/or related to generalized weakness without history of syncope.  Given tachycardia and recent GI symptoms, dehydration is certainly a concern.  Further work-up of potential trauma per Dr. Tomi Bamberger.  Abdominal pain: Present for the last day and accompanied by nausea, vomiting, and diarrhea.  Diffuse tenderness noted on exam, most pronounced in the epigastrium.  Ongoing work-up per ED.  For questions or updates, please contact [place] Please consult [email or web address] for contact info under   Signed, Nelva Bush, MD  11/08/2020 1:02 PM

## 2020-11-08 NOTE — Anesthesia Procedure Notes (Signed)
Procedure Name: Intubation Date/Time: 11/08/2020 6:35 PM Performed by: Rande Brunt, CRNA Pre-anesthesia Checklist: Patient identified, Emergency Drugs available, Suction available and Patient being monitored Patient Re-evaluated:Patient Re-evaluated prior to induction Oxygen Delivery Method: Circle System Utilized Preoxygenation: Pre-oxygenation with 100% oxygen Induction Type: IV induction Ventilation: Mask ventilation without difficulty Laryngoscope Size: Glidescope and 3 Grade View: Grade I Tube type: Oral Tube size: 7.0 mm Number of attempts: 1 Airway Equipment and Method: Stylet Placement Confirmation: ETT inserted through vocal cords under direct vision,  positive ETCO2 and breath sounds checked- equal and bilateral Secured at: 22 cm Tube secured with: Tape Dental Injury: Teeth and Oropharynx as per pre-operative assessment

## 2020-11-08 NOTE — ED Triage Notes (Addendum)
Pt to ED via EMS from home with initial complaint of  mechanical fall. Pt in a sling d/t fall prior to today (Wednesday). On ems arrival complain of increased right shoulder pain, generalized abdominal pain, and nausea. No LOC with fall, no blood thinners. HX HTN. Given 50 Fentanyl, 4mg  zofran, pt placed on 12 lead- and STEMI activated by EMS, 324 mg aspirin given. Denies chest pain Last VS: 125/70, 95%RA, 22 rr, hr 140.

## 2020-11-08 NOTE — Progress Notes (Signed)
Discussed with son.  Pt has husband with mild cognitive impairment.  Pt will almost certainly need SNF or rehab post discharge as her dominant arm is injured and her husband is not going to be able to fully be able to care for her at home until she gets a bit better.

## 2020-11-08 NOTE — Progress Notes (Signed)
Notified provider of need to order repeat lactic acid. ° °

## 2020-11-08 NOTE — Progress Notes (Signed)
Orthopedic Tech Progress Note Patient Details:  Chloe Young 1951-02-20 [id number] Applied by EMT  Patient ID: Chloe Young, female   DOB: [birth year], 69 y.o.   MRN: [id number]   Chloe Young 11/08/2020, 4:48 PM

## 2020-11-08 NOTE — Progress Notes (Signed)
Notified bedside nurse of need to draw lactic acid.  

## 2020-11-08 NOTE — ED Notes (Signed)
VRBO per MD,Knapp. PO Tylenol to Rectal Tylenol.

## 2020-11-09 ENCOUNTER — Encounter (HOSPITAL_COMMUNITY): Payer: Self-pay | Admitting: General Surgery

## 2020-11-09 ENCOUNTER — Telehealth: Payer: Medicare Other

## 2020-11-09 DIAGNOSIS — R9431 Abnormal electrocardiogram [ECG] [EKG]: Secondary | ICD-10-CM | POA: Diagnosis not present

## 2020-11-09 DIAGNOSIS — A419 Sepsis, unspecified organism: Secondary | ICD-10-CM | POA: Diagnosis not present

## 2020-11-09 LAB — GLUCOSE, CAPILLARY
Glucose-Capillary: 107 mg/dL — ABNORMAL HIGH (ref 70–99)
Glucose-Capillary: 109 mg/dL — ABNORMAL HIGH (ref 70–99)
Glucose-Capillary: 111 mg/dL — ABNORMAL HIGH (ref 70–99)
Glucose-Capillary: 116 mg/dL — ABNORMAL HIGH (ref 70–99)
Glucose-Capillary: 122 mg/dL — ABNORMAL HIGH (ref 70–99)
Glucose-Capillary: 140 mg/dL — ABNORMAL HIGH (ref 70–99)

## 2020-11-09 LAB — BASIC METABOLIC PANEL
Anion gap: 7 (ref 5–15)
BUN: 9 mg/dL (ref 8–23)
CO2: 21 mmol/L — ABNORMAL LOW (ref 22–32)
Calcium: 7.4 mg/dL — ABNORMAL LOW (ref 8.9–10.3)
Chloride: 109 mmol/L (ref 98–111)
Creatinine, Ser: 0.64 mg/dL (ref 0.44–1.00)
GFR, Estimated: >60 mL/min (ref >60)
Glucose, Bld: 122 mg/dL — ABNORMAL HIGH (ref 70–99)
Potassium: 2.8 mmol/L — ABNORMAL LOW (ref 3.5–5.1)
Sodium: 137 mmol/L (ref 135–145)

## 2020-11-09 LAB — CBC
HCT: 25.3 % — ABNORMAL LOW (ref 36.0–46.0)
HCT: 25.3 % — ABNORMAL LOW (ref 36.0–46.0)
Hemoglobin: 8.4 g/dL — ABNORMAL LOW (ref 12.0–15.0)
Hemoglobin: 8.2 g/dL — ABNORMAL LOW (ref 12.0–15.0)
MCH: 31.3 pg (ref 26.0–34.0)
MCH: 30.6 pg (ref 26.0–34.0)
MCHC: 33.2 g/dL (ref 30.0–36.0)
MCHC: 32.4 g/dL (ref 30.0–36.0)
MCV: 94.4 fL (ref 80.0–100.0)
MCV: 94.4 fL (ref 80.0–100.0)
Platelets: 163 10*3/uL (ref 150–400)
Platelets: 160 10*3/uL (ref 150–400)
RBC: 2.68 MIL/uL — ABNORMAL LOW (ref 3.87–5.11)
RBC: 2.68 MIL/uL — ABNORMAL LOW (ref 3.87–5.11)
RDW: 13.6 % (ref 11.5–15.5)
RDW: 13.6 % (ref 11.5–15.5)
WBC: 3.5 10*3/uL — ABNORMAL LOW (ref 4.0–10.5)
WBC: 4.2 10*3/uL (ref 4.0–10.5)
nRBC: 0 % (ref 0.0–0.2)
nRBC: 0 % (ref 0.0–0.2)

## 2020-11-09 LAB — PHOSPHORUS: Phosphorus: 1.7 mg/dL — ABNORMAL LOW (ref 2.5–4.6)

## 2020-11-09 LAB — HEMOGLOBIN A1C
Hgb A1c MFr Bld: 5.3 % (ref 4.8–5.6)
Mean Plasma Glucose: 105.41 mg/dL

## 2020-11-09 LAB — LACTIC ACID, PLASMA: Lactic Acid, Venous: 1.2 mmol/L (ref 0.5–1.9)

## 2020-11-09 LAB — MAGNESIUM: Magnesium: 1.6 mg/dL — ABNORMAL LOW (ref 1.7–2.4)

## 2020-11-09 MED ORDER — POTASSIUM PHOSPHATES 15 MMOLE/5ML IV SOLN
30.0000 mmol | Freq: Once | INTRAVENOUS | Status: AC
  Administered 2020-11-09: 30 mmol via INTRAVENOUS
  Filled 2020-11-09: qty 10

## 2020-11-09 MED ORDER — ACETAMINOPHEN 10 MG/ML IV SOLN
1000.0000 mg | Freq: Four times a day (QID) | INTRAVENOUS | Status: AC
  Administered 2020-11-09 – 2020-11-10 (×4): 1000 mg via INTRAVENOUS
  Filled 2020-11-09 (×6): qty 100

## 2020-11-09 MED ORDER — ENOXAPARIN SODIUM 40 MG/0.4ML ~~LOC~~ SOLN
40.0000 mg | Freq: Every day | SUBCUTANEOUS | Status: DC
  Administered 2020-11-09 – 2020-11-22 (×14): 40 mg via SUBCUTANEOUS
  Filled 2020-11-09 (×14): qty 0.4

## 2020-11-09 MED ORDER — PHENYLEPHRINE HCL-NACL 10-0.9 MG/250ML-% IV SOLN: 25.0000 ug/min | INTRAVENOUS | Status: DC

## 2020-11-09 MED ORDER — CHLORHEXIDINE GLUCONATE CLOTH 2 % EX PADS
6.0000 | MEDICATED_PAD | Freq: Every day | CUTANEOUS | Status: DC
  Administered 2020-11-09 – 2020-11-22 (×13): 6 via TOPICAL

## 2020-11-09 MED ORDER — SODIUM CHLORIDE 0.9 % IV SOLN: 250.0000 mL | INTRAVENOUS | Status: DC

## 2020-11-09 MED ORDER — KCL IN DEXTROSE-NACL 20-5-0.45 MEQ/L-%-% IV SOLN
INTRAVENOUS | Status: DC
  Filled 2020-11-09 (×2): qty 1000

## 2020-11-09 MED ORDER — LACTATED RINGERS IV BOLUS
1000.0000 mL | Freq: Once | INTRAVENOUS | Status: AC
  Administered 2020-11-09: 1000 mL via INTRAVENOUS

## 2020-11-09 MED ORDER — METHOCARBAMOL 1000 MG/10ML IJ SOLN
500.0000 mg | Freq: Three times a day (TID) | INTRAVENOUS | Status: DC
  Administered 2020-11-09 – 2020-11-10 (×2): 500 mg via INTRAVENOUS
  Filled 2020-11-09: qty 5
  Filled 2020-11-09: qty 500

## 2020-11-09 MED ORDER — MAGNESIUM SULFATE 2 GM/50ML IV SOLN
2.0000 g | Freq: Once | INTRAVENOUS | Status: AC
  Administered 2020-11-09: 2 g via INTRAVENOUS
  Filled 2020-11-09: qty 50

## 2020-11-09 MED ORDER — HYDROMORPHONE HCL 1 MG/ML IJ SOLN
0.5000 mg | INTRAMUSCULAR | Status: DC | PRN
  Administered 2020-11-09 – 2020-11-14 (×19): 0.5 mg via INTRAVENOUS
  Filled 2020-11-09 (×2): qty 0.5
  Filled 2020-11-09: qty 1
  Filled 2020-11-09 (×4): qty 0.5
  Filled 2020-11-09: qty 1
  Filled 2020-11-09: qty 0.5
  Filled 2020-11-09 (×4): qty 1
  Filled 2020-11-09: qty 0.5
  Filled 2020-11-09 (×2): qty 1
  Filled 2020-11-09: qty 0.5
  Filled 2020-11-09: qty 1
  Filled 2020-11-09: qty 0.5
  Filled 2020-11-09: qty 1

## 2020-11-09 MED ORDER — ALBUMIN HUMAN 25 % IV SOLN
12.5000 g | Freq: Once | INTRAVENOUS | Status: AC
  Administered 2020-11-09: 12.5 g via INTRAVENOUS
  Filled 2020-11-09: qty 50

## 2020-11-09 MED ORDER — PROCHLORPERAZINE EDISYLATE 10 MG/2ML IJ SOLN
10.0000 mg | Freq: Once | INTRAMUSCULAR | Status: AC
  Administered 2020-11-09: 10 mg via INTRAVENOUS
  Filled 2020-11-09: qty 2

## 2020-11-09 MED ORDER — POTASSIUM CHLORIDE 10 MEQ/100ML IV SOLN
10.0000 meq | INTRAVENOUS | Status: AC
  Administered 2020-11-09 (×5): 10 meq via INTRAVENOUS
  Filled 2020-11-09 (×6): qty 100

## 2020-11-09 MED ORDER — POTASSIUM PHOSPHATES 15 MMOLE/5ML IV SOLN
20.0000 mmol | Freq: Once | INTRAVENOUS | Status: DC
  Filled 2020-11-09: qty 6.67

## 2020-11-09 NOTE — Consult Note (Signed)
Interior Nurse ostomy consult note Stoma type/location: LLQ, end colostomy Stomal assessment/size: 1 3/4" budded, pink, moist  Peristomal assessment: pouch intact from the OR last evening  Treatment options for stomal/peristomal skin: NA Output: scant, bloody Ostomy pouching: 2pc. 2 3/4" in place  Education provided:  Explained role of ostomy nurse and creation of stoma  Patient lives with husband who based on review of the chart has dementia and bedside nurse also relays message from son from out of town same.  Son is traveling from [place] to see patient. Patient also have reported fracture to the proximal humerus region and is to have follow up with orthopedics (this from outside facility) Discussed with patient need to educate someone to assist with ostomy care, she reports her husband can support, however son has reported dementia.  Patient in a sling with dominant hand. Will need full support with care; requested husband to be present between 10am-12 tomorrow.  Bedside nurse to assess today cognitive status of patient's husband.  Son will be "back and forth".  May need short term SNF for rehab.   Enrolled patient in [place] program: Yes  [place] Nurse will follow along with you for continued support with ostomy teaching and care [place] MSN, [place], [place], [place], [place]

## 2020-11-09 NOTE — Evaluation (Signed)
Physical Therapy Evaluation Patient Details Name: Chloe Young MRN: [id number] DOB: 07/23/1951 Today's Date: 11/09/2020   History of Present Illness  Pt adm 11/15 with sepsis due to acute bowel perforation. Had 2 recent falls at home and has rt humerus fracture due to fall. 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   Clinical Impression  Pt presents to PT with decr mobility after multiple medical problems and injuries. Pt has husband at home but he evidently has dementia and likely won't be able to provide as much support as she will need at home. Feel pt could benefit from ST-SNF and pt in agreement.     Follow Up Recommendations SNF    Equipment Recommendations  None recommended by PT    Recommendations for Other Services       Precautions / Restrictions Precautions Precautions: Fall;Shoulder Required Braces or Orthoses: Sling      Mobility  Bed Mobility Overal bed mobility: Needs Assistance Bed Mobility: Supine to Sit     Supine to sit: Min assist     General bed mobility comments: Assist to elevate trunk into sitting and bring hips to EOB    Transfers Overall transfer level: Needs assistance Equipment used: 1 person hand held assist Transfers: Sit to/from [facility] Sit to Stand: Min assist Stand pivot transfers: Min assist       General transfer comment: Assist to bring hips up and for balance. Bed to bsc to chair with pivotal steps  Ambulation/Gait             General Gait Details: Not attempted due to lines/tubes  Stairs            Wheelchair Mobility    Modified Rankin (Stroke Patients Only)       Balance Overall balance assessment: Needs assistance Sitting-balance support: Single extremity supported;Feet supported Sitting balance-Leahy Scale: Poor Sitting balance - Comments: UE support sitting EOB   Standing balance  support: Single extremity supported Standing balance-Leahy Scale: Poor Standing balance comment: UE support for static standing                             Pertinent Vitals/Pain Pain Assessment: 0-10 Pain Score: 7  Pain Location: abdomen Pain Descriptors / Indicators: Sore;Guarding Pain Intervention(s): Limited activity within patient's tolerance;Premedicated before session;Repositioned    Home Living Family/patient expects to be discharged to:: Private residence Living Arrangements: Spouse/significant other (husband with reported dementia) Available Help at Discharge: Family;Available PRN/intermittently Type of Home: House Home Access: Stairs to enter Entrance Stairs-Rails: Right Entrance Stairs-Number of Steps: 5 Home Layout: One level Home Equipment: Walker - 2 wheels;Cane - single point;Grab bars - tub/shower      Prior Function Level of Independence: Independent               Hand Dominance   Dominant Hand: Right    Extremity/Trunk Assessment   Upper Extremity Assessment Upper Extremity Assessment: Defer to OT evaluation    Lower Extremity Assessment Lower Extremity Assessment: Generalized weakness       Communication   Communication: No difficulties  Cognition Arousal/Alertness: Awake/alert Behavior During Therapy: WFL for tasks assessed/performed Overall Cognitive Status: Within Functional Limits for tasks assessed  General Comments General comments (skin integrity, edema, etc.): Removed O2 with SpO2 95% with mobility    Exercises     Assessment/Plan    PT Assessment Patient needs continued PT services  PT Problem List Decreased strength;Decreased balance;Decreased mobility;Pain       PT Treatment Interventions DME instruction;Gait training;Functional mobility training;Therapeutic activities;Therapeutic exercise;Balance training;Patient/family education    PT Goals (Current  goals can be found in the Care Plan section)  Acute Rehab PT Goals Patient Stated Goal: return home PT Goal Formulation: With patient Time For Goal Achievement: 11/23/20 Potential to Achieve Goals: Good    Frequency Min 3X/week   Barriers to discharge Decreased caregiver support Husband with dementia    Co-evaluation               AM-PAC PT "6 Clicks" Mobility  Outcome Measure Help needed turning from your back to your side while in a flat bed without using bedrails?: A Little Help needed moving from lying on your back to sitting on the side of a flat bed without using bedrails?: A Little Help needed moving to and from a bed to a chair (including a wheelchair)?: A Little Help needed standing up from a chair using your arms (e.g., wheelchair or bedside chair)?: A Little Help needed to walk in hospital room?: A Little Help needed climbing 3-5 steps with a railing? : A Lot 6 Click Score: 17    End of Session   Activity Tolerance: Patient tolerated treatment well Patient left: in chair;with call bell/phone within reach;with chair alarm set Nurse Communication: Mobility status PT Visit Diagnosis: Other abnormalities of gait and mobility (R26.89);Muscle weakness (generalized) (M62.81);History of falling (Z91.81)    Time: 5208-0223 PT Time Calculation (min) (ACUTE ONLY): 33 min   Charges:   PT Evaluation $PT Eval Moderate Complexity: 1 Mod PT Treatments $Therapeutic Activity: 8-22 mins        [facility] PT Acute Rehabilitation Services Pager [phone] Office [phone]   Shary Decamp [facility] 11/09/2020, 12:37 PM

## 2020-11-09 NOTE — Progress Notes (Signed)
Progress Note  1 Day Post-Op  Subjective: Patient reports abdominal pain this AM. She also reports that she feels like she needs to pee but has not been able to. Denies nausea. She reports that it would likely be best to update her son on a daily basis. She has been seeing Dr. Veverly Fells for R shoulder injury and is supposed to see him in the office Thursday.   Objective: Vital signs in last 24 hours: Temp:  [98.5 F (36.9 C)-103.1 F (39.5 C)] 99.7 F (37.6 C) (11/16 0816) Pulse Rate:  [90-140] 112 (11/16 0900) Resp:  [17-31] 20 (11/16 0900) BP: (63-144)/(43-91) 126/49 (11/16 0900) SpO2:  [89 %-100 %] 93 % (11/16 0900) Weight:  [59.9 kg-67.4 kg] 67.4 kg (11/15 2209) Last BM Date:  (PTA)  Intake/Output from previous day: 11/15 0701 - 11/16 0700 In: 5915.1 [I.V.:3373.7; IV Piggyback:2541.4] Out: 1250 [Urine:750; Emesis/NG output:400; Blood:100] Intake/Output this shift: No intake/output data recorded.  PE: General: pleasant, WD, WN female/female HEENT: head is normocephalic, atraumatic.  Sclera are noninjected.  PERRL.  Ears and nose without any masses or lesions.  Mouth is pink and moist Heart: sinus tachy in 130s.  Normal s1,s2. No obvious murmurs, gallops, or rubs noted.  Palpable radial and pedal pulses bilaterally Lungs: CTAB, no wheezes, rhonchi, or rales noted.  Respiratory effort nonlabored Abd: soft, appropriately ttp, not distended, NGT with bilious drainage, midline wound clean, stoma edematous as expected but no output yet MS: RUE in shoulder sling   Lab Results:  Recent Labs    11/08/20 1236 11/08/20 1236 11/08/20 1246 11/09/20 0805  WBC 9.0  --   --  3.5*  HGB 13.2   < > 13.3 8.4*  HCT 40.9   < > 39.0 25.3*  PLT 273  --   --  163   < > = values in this interval not displayed.   BMET Recent Labs    11/08/20 1236 11/08/20 1246  NA 135 136  K 3.6 3.5  CL 102 102  CO2 18*  --   GLUCOSE 157* 155*  BUN 14 16  CREATININE 1.01* 0.90  CALCIUM 8.6*  --     PT/INR Recent Labs    11/08/20 1236  LABPROT 15.3*  INR 1.3*   CMP     Component Value Date/Time   NA 136 11/08/2020 1246   K 3.5 11/08/2020 1246   CL 102 11/08/2020 1246   CO2 18 (L) 11/08/2020 1236   GLUCOSE 155 (H) 11/08/2020 1246   BUN 16 11/08/2020 1246   CREATININE 0.90 11/08/2020 1246   CALCIUM 8.6 (L) 11/08/2020 1236   PROT 7.3 11/08/2020 1236   ALBUMIN 3.5 11/08/2020 1236   AST 54 (H) 11/08/2020 1236   ALT 34 11/08/2020 1236   ALKPHOS 40 11/08/2020 1236   BILITOT 1.5 (H) 11/08/2020 1236   GFRNONAA 60 (L) 11/08/2020 1236   GFRAA >90 01/15/2012 0344   Lipase     Component Value Date/Time   LIPASE 25 11/08/2020 1236       Studies/Results: DG Abdomen 1 View  Result Date: 11/08/2020 CLINICAL DATA:  NG tube placement EXAM: ABDOMEN - 1 VIEW COMPARISON:  CT earlier today. FINDINGS: NG tube is within the stomach. Free air noted under the right hemidiaphragm as seen on prior CT. IMPRESSION: NG tube in the stomach. Electronically Signed   By: Rolm Baptise M.D.   On: 11/08/2020 17:44   CT ABDOMEN PELVIS W CONTRAST  Result Date: 11/08/2020 CLINICAL DATA:  69 year old female with history of abdominal pain. Fever. EXAM: CT ABDOMEN AND PELVIS WITH CONTRAST TECHNIQUE: Multidetector CT imaging of the abdomen and pelvis was performed using the standard protocol following bolus administration of intravenous contrast. CONTRAST:  119mL OMNIPAQUE IOHEXOL 300 MG/ML  SOLN COMPARISON:  No priors. FINDINGS: Lower chest: Subsegmental atelectasis noted in the lower lobes of the lungs bilaterally. Hepatobiliary: No suspicious cystic or solid hepatic lesions. No intra or extrahepatic biliary ductal dilatation. Gallbladder is unremarkable in appearance. Pancreas: No pancreatic mass. No pancreatic ductal dilatation. No pancreatic or peripancreatic fluid collections or inflammatory changes. Spleen: Unremarkable. Adrenals/Urinary Tract: Bilateral kidneys and adrenal glands are normal in  appearance. No hydroureteronephrosis. Urinary bladder is normal in appearance. Stomach/Bowel: Stomach is unremarkable in appearance. There are numerous dilated loops of proximal to mid small bowel, with gradual transition to nondilated loops of mid to distal small bowel. The exact transition point is difficult to localize, however, in the low anatomic pelvis there are several loops of thickened small bowel which demonstrate associated mucosal hyperenhancement, with some surrounding fluid and small amount of extraluminal gas, concerning for regional enteritis affecting predominantly the mid to distal jejunum and/or proximal ileum. Terminal ileum is decompressed, but otherwise grossly unremarkable in appearance. Appendix is normal. Colon is relatively decompressed. There are numerous colonic diverticuli, particularly in the sigmoid colon. There are some mild inflammatory changes in the sigmoid colon, without definitive focal inflammatory changes to clearly indicate an acute diverticulitis at this time. Vascular/Lymphatic: Aortic atherosclerosis without evidence of aneurysm or dissection in the abdominal or pelvic vasculature. No lymphadenopathy noted in the abdomen or pelvis. Reproductive: Uterus and ovaries are unremarkable in appearance. Other: Small volume of ascites most evident in the central small bowel mesentery adjacent to the inflamed loops of small bowel. Small amount of pneumoperitoneum, also most evident in the small bowel mesentery adjacent to the inflamed loops of small bowel, and in the upper abdomen adjacent to the liver. Musculoskeletal: There are no aggressive appearing lytic or blastic lesions noted in the visualized portions of the skeleton. IMPRESSION: 1. Findings are compatible with an acute bowel perforation. The exact site of perforation is uncertain, however, the favored candidate is the mid small bowel (likely distal jejunum or proximal ileum) where there are multiple loops of thickened and  inflamed small bowel with adjacent fluid and gas in the small bowel mesentery. Associated with this there is a partial small bowel obstruction, as above. 2. There is also colonic diverticulosis. There are some subtle inflammatory changes in the sigmoid mesocolon. The possibility of an acute diverticulitis is not excluded, but not strongly favored on the basis of today's examination; however, direct inspection at time of potential laparoscopy is suggested as this could alternatively represent this site of perforation. 3. Aortic atherosclerosis. Critical Value/emergent results were called by telephone at the time of interpretation on 11/08/2020 at 3:09 pm to provider [facility], who verbally acknowledged these results. Electronically Signed   By: Vinnie Langton M.D.   On: 11/08/2020 15:10   DG Chest Portable 1 View  Result Date: 11/08/2020 CLINICAL DATA:  Right-sided chest pain EXAM: PORTABLE CHEST 1 VIEW COMPARISON:  07/24/2011 FINDINGS: The heart size and mediastinal contours are within normal limits. Lung volumes with mild bibasilar atelectasis. No pleural effusion or pneumothorax. There is an acute fracture of the proximal right humerus with transverse component involving the surgical neck as well as a nondisplaced component involving the greater tuberosity. Fracture is mildly displaced and angulated. Glenohumeral joint alignment appears maintained  without dislocation. IMPRESSION: 1. Acute fracture of the proximal right humerus. 2. Low lung volumes with mild bibasilar atelectasis. Electronically Signed   By: Davina Poke D.O.   On: 11/08/2020 13:03    Anti-infectives: Anti-infectives (From admission, onward)   Start     Dose/Rate Route Frequency Ordered Stop   11/09/20 2330  piperacillin-tazobactam (ZOSYN) IVPB 3.375 g       "Followed by" Linked Group Details   3.375 g 12.5 mL/hr over 240 Minutes Intravenous Every 8 hours 11/08/20 1704     11/08/20 2200  ceFEPIme (MAXIPIME) 2 g in sodium  chloride 0.9 % 100 mL IVPB  Status:  Discontinued        2 g 200 mL/hr over 30 Minutes Intravenous Every 12 hours 11/08/20 1335 11/08/20 1638   11/08/20 1830  piperacillin-tazobactam (ZOSYN) IVPB 3.375 g        3.375 g 12.5 mL/hr over 240 Minutes Intravenous To Surgery 11/08/20 1823 11/09/20 1830   11/08/20 1715  piperacillin-tazobactam (ZOSYN) IVPB 3.375 g       "Followed by" Linked Group Details   3.375 g 100 mL/hr over 30 Minutes Intravenous  Once 11/08/20 1704     11/08/20 1245  ceFEPIme (MAXIPIME) 2 g in sodium chloride 0.9 % 100 mL IVPB        2 g 200 mL/hr over 30 Minutes Intravenous  Once 11/08/20 1243 11/08/20 1338       Assessment/Plan HTN HLD Chronic pain Recent hx of R humerus fracture - She is being followed by Dr. Veverly Fells as an outpatient, will notify that patient is admitted   Perforated sigmoid diverticulitis  S/p Hartmann's procedure 11/08/20 Dr. Barry Dienes - POD#1 - stoma edematous but as expected this early post-op - WOC consult today  - NPO and NGT to LIWS today - drainage is bilious and stoma without any output, expect ileus  - mobilize as able - BID dressing changes - added IV tylenol and IV robaxin for pain control  - check prealbumin tomorrow   FEN: NPO, IVF, NGT to LIWS VTE: SCDs, start lovenox today ID: cefepime 11/15; IV Zosyn 11/15>>   LOS: 1 day    Norm Parcel , [facility] 11/09/2020, 9:30 AM Please see Amion for pager number during day hours 7:00am-4:30pm

## 2020-11-09 NOTE — Progress Notes (Signed)
[place] Progress Note Patient Name: Chloe Young DOB: [birth year] MRN: [id number]   Date of Service  11/09/2020  HPI/Events of Note  BP soft = 95/48 with MAP = 63.   eICU Interventions  Plan: 1. Bolus with LR 1 liter IV over 1 hour now.      Intervention Category Major Interventions: Hypotension - evaluation and management  Marcile Fuquay Eugene 11/09/2020, 2:38 AM

## 2020-11-09 NOTE — Progress Notes (Signed)
   Following up on cardiology encounter from yesterday when there was concern about the EKG showing potential ischemic changes in the inferior leads.  Follow-up EKGs do not show any pattern consistent with acute ongoing ischemia.  EKG does demonstrate inferior Q waves from a tracing of 07/02/2012.  Troponin markers do not demonstrate evidence of injury.  Overall, patient is stable from cardiac standpoint.  The question of prior inferior infarction pains and could be worked up at some point or not.  Risk factor modification especially towards lipids, blood pressure will be helpful.  [facility] will sign off.   Medication Recommendations: None Other recommendations (labs, testing, etc): None  Follow up as an outpatient: None required unless felt indicated by primary physician.

## 2020-11-09 NOTE — Progress Notes (Signed)
Pharmacy Electrolyte Replacement  Recent Labs:  Recent Labs    11/09/20 0805  K 2.8*  MG 1.6*  PHOS 1.7*  CREATININE 0.64     Plan:   KCL 10 meq IV x 6 Mag 2 gms IV x 1 KPhos 20 mmol IV x 1   Alanda Slim, PharmD, [place] Clinical Pharmacist Please see AMION for all Pharmacists' Contact Phone Numbers 11/09/2020, 10:10 AM

## 2020-11-09 NOTE — Progress Notes (Signed)
[place] Progress Note Patient Name: Chloe Young DOB: Jun 05, 1951 MRN: [id number]   Date of Service  11/09/2020  HPI/Events of Note  Hypotension - BP = 88/49 with MAP = 60. No CVL or CVP.   eICU Interventions  Plan: 1. 25% Albumin 12.5 gm IV now. 2. Phenylephrine IV infusion. Titrate to MAP >= 65.     Intervention Category Major Interventions: Hypotension - evaluation and management  Lailyn Appelbaum Eugene 11/09/2020, 4:54 AM

## 2020-11-09 NOTE — Progress Notes (Addendum)
11/09/2020 I saw and evaluated the patient. Discussed with resident and agree with resident's findings and plan as documented in the resident's note.  I have seen and evaluated the patient for postoperative hypotension.    S:  Off pressors after supplemental fluids given.  Abd pain not completely controlled with morphine.  No ostomy output yet.  O: Blood pressure (!) 107/56, pulse (!) 110, temperature 99.1 F (37.3 C), temperature source Oral, resp. rate 16, height 5\' 3"  (1.6 m), weight 67.4 kg, SpO2 93 %.  No acute distress MMM Abd TTP, no BS Ext without edema Moves all 4 ext to command Fair insight R arm in sling  A:  Distributive shock after perforated bowel s/p sigmoid colectomy with end colostomy Postoperative hypotension improved with fluids R arm fracture after fall, hx of EtOH abuse, osteoporosis  P:  Monitor UoP, creatinine Zosyn duration TBD PPx started by Gen surg Change IV morphine to IV dilaudid with slightly higher dosing CIWA PRN Thiamine/folate Await return of bowel function, appreciate General surgery following Would get Dr. Veverly Fells' group to see tomorrow Stable for progressive, off pressors, appreciate TRH taking over care  11/09/2020 Erskine Emery MD       NAME:  Chloe Young, MRN:  [id number], DOB:  12/17/1951, LOS: 1 ADMISSION DATE:  11/08/2020, CONSULTATION DATE:  11/08/2020 REFERRING MD:  Dr. Tomi Young, CHIEF COMPLAINT:  Sepsis with bowel peforation   Brief History   69yo female admitted after being a mechanical with associated right shoulder pain, abdominal pain, nausea and vomiting. She was found to be septic due to bowel perforation and has an acute fracture to the right humerus.   History of present illness   Chloe Young is a 69yo female with PHX significant for osteopenia, HTN, HLD, chronic pain with cervical post laminectomy syndrome who presented to the ED after fall at home. Patient reports she slid off the couch to the floor and was to weak to  get up therefore EMS was called.Per chart review patient reported prior fall a week prior to admission where she injured the right shoulder and was possible diagnosed with a humeral fracture at that time. She additionally reported upper abdominal pain, nausea, and vomiting on admission.  On EMS arrival she was seen tachycardic with EKG changes prompting STEMI activation.   On arrival to ED patient was immediately meet by cardiology who felt that patient did not meet STEMI criteria and code STEMI was canceled. Patient was seen febrile with T-max 103.1, tachypenic, tachycardiac with HR 130-140, and intermittently hypotensive making concern for sepsis high. Labwork significant for: creatinine 1.01, AST 54, troponin 34, lactic acid 3.3, and INR 1.3. CT abdomen was obtained to evaluate for possible infection source and acute bowel perforation was seen. General surgery was consulted and will take patient for surgical repair. Given sepsis with tachycardia and intermittent hypotension PCCM was consulted for further management and admission   Past Medical History  Osteopenia, HTN, HLD, chronic pain with cervical post laminectomy syndrome  Significant Hospital Events   Admitted 11/15 with sepsis from acute bowel perforation   Consults:  General surgery   Procedures:  Exploratory laparotomy 11/15   Significant Diagnostic Tests:  CT ABD 11/15 > acute bowel perforation with colonic diverticulosis   Micro Data:  COVID 11/15 > negative   Antimicrobials:  Cefepime 11/15 x1 Zosyn 11/15 >   Interim history/subjective:  Having some abdominal pain and nausea, just received pain medication. Has not made urine since foley removed but feels like  she needs to urinate. She has not made any gas or had any output from her stoma.    Objective   Blood pressure (!) 111/47, pulse (!) 113, temperature 98.5 F (36.9 C), temperature source Oral, resp. rate 18, height 5\' 3"  (1.6 m), weight 67.4 kg, SpO2 (!) 89 %.         Intake/Output Summary (Last 24 hours) at 11/09/2020 0654 Last data filed at 11/09/2020 0600 Gross per 24 hour  Intake 5915.08 ml  Output 1250 ml  Net 4665.08 ml   Filed Weights   11/08/20 1304 11/08/20 2209  Weight: 59.9 kg 67.4 kg    Examination: General:  Elderly F, awake, uncomfortable-appearing HEENT: MM pink/dry Neuro: awake and following commands, oriented to person and place CV: s1s2 tachycardic, rrr, no m/r/g PULM:  CTAB on room air GI: soft, diffusely TTP, distended, dressings in place, stoma pink but protruding, no gas in bag Extremities: warm/dry, trace edema  Skin: no rashes or lesions, no strike through abdominal dressings  Resolved Hospital Problem list     Assessment & Plan:  Severe sepsis  Improved s/p laparotomy. Afebrile overnight, not requiring pressors  P: Admit ICU Supplemental oxygen to maintain sats greater than 92% Cultures pending  IV Zosyn  Aggressive IV hydration provided on arrival, s/p 2L overnight, BP stable   MAP goal greater than 65 Stable for transfer to progressive   Acute bowel perforation  Sigmoid colectomy with end colostomy POD 1.  P: Management per surgery, admit to ICU for close post-op monitoring, BP stable but may require pressors peri-operatively Change Cefepime to Zosyn  Follow blood cultures Still having pain, Switch morphine to dilaudid  Acute R humerus fracture and recent fall with hx of osteoporosis and ETOH use  -Pt reports a fall on 11/10, did not seek medical care after this, states she stepped wrong. ? Related to ETOH use. Reports 1-2 drinks per night P: Sling to R arm, no hip fx on CT pelvis or obvious traumatic wounds on exam Will need ortho f/u non-acutely  Check UDS CIWA if needed  Folate and Thiamine  Hypertension Home medications include HCTZ and Metoprolol P: Hold home medications   Best practice:  Diet: NPO Pain/Anxiety/Delirium protocol (if indicated): dilaudid prn VAP protocol (if  indicated): n/a DVT prophylaxis: scd's GI prophylaxis: n/a Glucose control: SSI Mobility: bed rest Code Status: full code Family Communication: Discussed with patient at bedside Disposition: transfer to progressive   Labs   CBC: Recent Labs  Lab 11/08/20 1236 11/08/20 1246  WBC 9.0  --   NEUTROABS 7.0  --   HGB 13.2 13.3  HCT 40.9 39.0  MCV 97.8  --   PLT 273  --     Basic Metabolic Panel: Recent Labs  Lab 11/08/20 1236 11/08/20 1246  NA 135 136  K 3.6 3.5  CL 102 102  CO2 18*  --   GLUCOSE 157* 155*  BUN 14 16  CREATININE 1.01* 0.90  CALCIUM 8.6*  --    GFR: Estimated Creatinine Clearance: 53.6 mL/min (by C-G formula based on SCr of 0.9 mg/dL). Recent Labs  Lab 11/08/20 1236 11/08/20 1240 11/08/20 1540  WBC 9.0  --   --   LATICACIDVEN  --  3.3* 2.9*    Liver Function Tests: Recent Labs  Lab 11/08/20 1236  AST 54*  ALT 34  ALKPHOS 40  BILITOT 1.5*  PROT 7.3  ALBUMIN 3.5   Recent Labs  Lab 11/08/20 1236  LIPASE 25  No results for input(s): AMMONIA in the last 168 hours.  ABG    Component Value Date/Time   TCO2 21 (L) 11/08/2020 1246     Coagulation Profile: Recent Labs  Lab 11/08/20 1236  INR 1.3*    Cardiac Enzymes: No results for input(s): CKTOTAL, CKMB, CKMBINDEX, TROPONINI in the last 168 hours.  HbA1C: Hgb A1c MFr Bld  Date/Time Value Ref Range Status  03/10/2020 01:03 PM 5.4 4.6 - 6.5 % Final    Comment:    Glycemic Control Guidelines for People with Diabetes:Non Diabetic:  <6%Goal of Therapy: <7%Additional Action Suggested:  >8%   07/31/2017 03:07 PM 5.6 4.6 - 6.5 % Final    Comment:    Glycemic Control Guidelines for People with Diabetes:Non Diabetic:  <6%Goal of Therapy: <7%Additional Action Suggested:  >8%     CBG: Recent Labs  Lab 11/08/20 1757 11/08/20 2209 11/09/20 0344  GLUCAP 138* 131* 111*    Review of Systems:   Negative except as noted in HPI  Past Medical History  She,  has a past medical  history of Cervical facet syndrome, Cervical post-laminectomy syndrome, Cervicalgia, Chronic pain syndrome, Hyperlipidemia, Hypertension, Osteoarthritis, localized, knee, and Osteopenia (06/2016 ).   Surgical History    Past Surgical History:  Procedure Laterality Date  . CERVIAL FUSION  2006   Dr Arnoldo Morale, [place]  . COLONOSCOPY  2011    Dr Fuller Plan  . ESI  09/03/13   L 5- S1 ; Dr Nelva Bush  . JOINT REPLACEMENT  AUG 2012    RIGHT KNEE ARTHROPLASTY  . KNEE ARTHROSCOPY     bilaterally; Dr Tonita Cong  . TOTAL KNEE ARTHROPLASTY  01/11/2012   Procedure: TOTAL KNEE ARTHROPLASTY;  Surgeon: Johnn Hai, MD;  Location: [facility];  Service: Orthopedics;  Laterality: Left;  General with Femoral nerve block     Social History   reports that she has never smoked. She has never used smokeless tobacco. She reports current alcohol use of about 14.0 standard drinks of alcohol per week. She reports that she does not use drugs.   Family History   Her family history includes Breast cancer in her maternal aunt; Cancer (age of onset: 47) in her father; Diabetes in her maternal grandfather; Diverticulitis (age of onset: 37) in her mother; Heart disease in her maternal uncle; Heart failure in her maternal grandfather; Hypertension in her sister; Stroke in her maternal uncle.   Allergies No Known Allergies   Home Medications  Prior to Admission medications   Medication Sig Start Date End Date Taking? Authorizing Provider  alendronate (FOSAMAX) 70 MG tablet TAKE 1 TABLET BY MOUTH  EVERY 7 DAYS TAKE WITH A  FULL GLASS OF WATER ON AN  EMPTY STOMACH Patient taking differently: Take 70 mg by mouth once a week.  10/10/19  Yes Burns, Claudina Lick, MD  Calcium Carbonate-Vitamin D (CALCIUM + D PO) Take 1 tablet by mouth daily.    Yes [provider]  fluticasone (FLONASE) 50 MCG/ACT nasal spray USE 1 SPRAY IN BOTH  NOSTRILS TWICE DAILY Patient taking differently: Place 1 spray into both nostrils in the morning and at bedtime.   06/30/20  Yes Burns, Claudina Lick, MD  hydrochlorothiazide (MICROZIDE) 12.5 MG capsule Take 1 capsule (12.5 mg total) by mouth daily. Annual appt due in March must see provider for future refills 08/04/20  Yes Burns, Claudina Lick, MD  HYDROcodone-acetaminophen (NORCO) 7.5-325 MG per tablet Take 1 tablet by mouth every 6 (six) hours as needed for moderate  pain.  10/28/13  Yes [provider]  LYRICA 150 MG capsule Take 150 mg by mouth 2 (two) times daily.  06/12/16  Yes [provider]  meloxicam (MOBIC) 15 MG tablet Take 1 tablet by mouth daily.    Yes [provider]  metoprolol tartrate (LOPRESSOR) 50 MG tablet Take 0.5 tablets (25 mg total) by mouth 2 (two) times daily. Annual appt due in March must see provider for future refills 08/04/20  Yes Burns, Claudina Lick, MD  Multiple Vitamin (MULITIVITAMIN WITH MINERALS) TABS Take 1 tablet by mouth daily.    Yes [provider]  omeprazole (PRILOSEC) 20 MG capsule TAKE 1 CAPSULE BY MOUTH  DAILY Annual appt due in March must see provider for future refills Patient taking differently: Take 20 mg by mouth daily.  08/04/20  Yes Burns, Claudina Lick, MD  rosuvastatin (CRESTOR) 5 MG tablet Take 1 tablet (5 mg total) by mouth daily. Annual appt due in March must see provider for future refills 08/04/20  Yes Burns, Claudina Lick, MD  topiramate (TOPAMAX) 100 MG tablet Take 2 tablets by mouth 2 (two) times daily. 05/01/16  Yes [provider]  triamcinolone (NASACORT) 55 MCG/ACT AERO nasal inhaler Place 2 sprays into the nose daily. 03/10/20  Yes Burns, Claudina Lick, MD  triamcinolone cream (KENALOG) 0.5 % APPLY 1 APPLICATION  TOPICALLY 2 (TWO) TIMES  DAILY AS NEEDED. Patient taking differently: Apply 1 application topically 2 (two) times daily as needed (rash).  09/26/18  Yes Binnie Rail, MD     Critical care time:    Performed by: Marty Heck  Total critical care time: 40 minutes  Critical care time was exclusive of separately billable  procedures and treating other patients.  Critical care was necessary to treat or prevent imminent or life-threatening deterioration.  Critical care was time spent personally by me on the following activities: development of treatment plan with patient and/or surrogate as well as nursing, discussions with consultants, evaluation of patient's response to treatment, examination of patient, obtaining history from patient or surrogate, ordering and performing treatments and interventions, ordering and review of laboratory studies, ordering and review of radiographic studies, pulse oximetry and re-evaluation of patient's condition.  Johnsie Cancel, NP-C [facility] Contact / Pager information can be found on Amion  11/09/2020, 6:54 AM

## 2020-11-09 NOTE — Anesthesia Postprocedure Evaluation (Signed)
Anesthesia Post Note  Patient: Chloe Young  Procedure(s) Performed: EXPLORATORY LAPAROTOMY (N/A Abdomen) SIGMOID COLECTOMY WITH COLOSTOMY CREATION/HARTMANN PROCEDURE (Abdomen)     Patient location during evaluation: PACU Anesthesia Type: General Level of consciousness: sedated Pain management: pain level controlled Vital Signs Assessment: post-procedure vital signs reviewed and stable Respiratory status: spontaneous breathing and respiratory function stable Cardiovascular status: stable Postop Assessment: no apparent nausea or vomiting Anesthetic complications: no   No complications documented.               Arita Severtson DANIEL

## 2020-11-09 NOTE — Progress Notes (Signed)
RN pulled pts foley catheter around 2330. Pt has received 2L boluses and has maintenance fluids running at 156mL/hr. Pt has not voided since foley was removed and states she does not feel the urge to void. Bladder scan showed 72mL, however difficult to obtain accurate readings due to pts recent abdominal surgery and dressing. RN placed pt on bedpan in an attempt to promote voiding. Will continue to monitor closely.  Clint Bolder, RN 11/09/20 6:19 AM

## 2020-11-09 NOTE — Progress Notes (Signed)
[place] Progress Note Patient Name: Chloe Young DOB: 09/07/1951 MRN: [id number]   Date of Service  11/09/2020  HPI/Events of Note  Nausea - Zofran given at 10:38 PM.  eICU Interventions  Plan: 1. Compazine 10 mg IV X 1 now.     Intervention Category Major Interventions: Other:  Bruin Bolger Cornelia Copa 11/09/2020, 1:14 AM

## 2020-11-10 ENCOUNTER — Inpatient Hospital Stay: Payer: Self-pay

## 2020-11-10 ENCOUNTER — Inpatient Hospital Stay (HOSPITAL_COMMUNITY): Payer: Medicare Other

## 2020-11-10 DIAGNOSIS — K659 Peritonitis, unspecified: Secondary | ICD-10-CM

## 2020-11-10 DIAGNOSIS — I10 Essential (primary) hypertension: Secondary | ICD-10-CM | POA: Diagnosis not present

## 2020-11-10 DIAGNOSIS — K219 Gastro-esophageal reflux disease without esophagitis: Secondary | ICD-10-CM

## 2020-11-10 DIAGNOSIS — E782 Mixed hyperlipidemia: Secondary | ICD-10-CM

## 2020-11-10 DIAGNOSIS — R198 Other specified symptoms and signs involving the digestive system and abdomen: Secondary | ICD-10-CM | POA: Diagnosis not present

## 2020-11-10 LAB — GLUCOSE, CAPILLARY
Glucose-Capillary: 124 mg/dL — ABNORMAL HIGH (ref 70–99)
Glucose-Capillary: 122 mg/dL — ABNORMAL HIGH (ref 70–99)
Glucose-Capillary: 84 mg/dL (ref 70–99)
Glucose-Capillary: 110 mg/dL — ABNORMAL HIGH (ref 70–99)

## 2020-11-10 LAB — CBC
HCT: 26.8 % — ABNORMAL LOW (ref 36.0–46.0)
Hemoglobin: 8.8 g/dL — ABNORMAL LOW (ref 12.0–15.0)
MCH: 32 pg (ref 26.0–34.0)
MCHC: 32.8 g/dL (ref 30.0–36.0)
MCV: 97.5 fL (ref 80.0–100.0)
Platelets: 175 10*3/uL (ref 150–400)
RBC: 2.75 MIL/uL — ABNORMAL LOW (ref 3.87–5.11)
RDW: 13.8 % (ref 11.5–15.5)
WBC: 4.7 10*3/uL (ref 4.0–10.5)
nRBC: 0 % (ref 0.0–0.2)

## 2020-11-10 LAB — COMPREHENSIVE METABOLIC PANEL
ALT: 20 U/L (ref 0–44)
AST: 21 U/L (ref 15–41)
Albumin: 2.4 g/dL — ABNORMAL LOW (ref 3.5–5.0)
Alkaline Phosphatase: 29 U/L — ABNORMAL LOW (ref 38–126)
Anion gap: 6 (ref 5–15)
BUN: 9 mg/dL (ref 8–23)
CO2: 18 mmol/L — ABNORMAL LOW (ref 22–32)
Calcium: 6.8 mg/dL — ABNORMAL LOW (ref 8.9–10.3)
Chloride: 107 mmol/L (ref 98–111)
Creatinine, Ser: 0.67 mg/dL (ref 0.44–1.00)
GFR, Estimated: >60 mL/min (ref >60)
Glucose, Bld: 567 mg/dL (ref 70–99)
Potassium: 5.8 mmol/L — ABNORMAL HIGH (ref 3.5–5.1)
Sodium: 131 mmol/L — ABNORMAL LOW (ref 135–145)
Total Bilirubin: 0.6 mg/dL (ref 0.3–1.2)
Total Protein: 5 g/dL — ABNORMAL LOW (ref 6.5–8.1)

## 2020-11-10 LAB — PREALBUMIN: Prealbumin: <5 mg/dL — ABNORMAL LOW (ref 18–38)

## 2020-11-10 LAB — URINE CULTURE: Culture: NO GROWTH

## 2020-11-10 LAB — SURGICAL PATHOLOGY

## 2020-11-10 LAB — HIV ANTIBODY (ROUTINE TESTING W REFLEX): HIV Screen 4th Generation wRfx: NONREACTIVE

## 2020-11-10 LAB — MAGNESIUM: Magnesium: 2.2 mg/dL (ref 1.7–2.4)

## 2020-11-10 LAB — PHOSPHORUS: Phosphorus: 1.5 mg/dL — ABNORMAL LOW (ref 2.5–4.6)

## 2020-11-10 MED ORDER — METHOCARBAMOL 1000 MG/10ML IJ SOLN
1000.0000 mg | Freq: Three times a day (TID) | INTRAVENOUS | Status: DC
  Administered 2020-11-10 – 2020-11-12 (×5): 1000 mg via INTRAVENOUS
  Filled 2020-11-10 (×5): qty 10

## 2020-11-10 MED ORDER — SODIUM CHLORIDE 0.9% FLUSH
10.0000 mL | INTRAVENOUS | Status: DC | PRN
  Administered 2020-11-15: 10 mL

## 2020-11-10 MED ORDER — ACETAMINOPHEN 10 MG/ML IV SOLN
1000.0000 mg | Freq: Four times a day (QID) | INTRAVENOUS | Status: AC
  Administered 2020-11-10 – 2020-11-11 (×4): 1000 mg via INTRAVENOUS
  Filled 2020-11-10 (×6): qty 100

## 2020-11-10 MED ORDER — CALCIUM GLUCONATE-NACL 2-0.675 GM/100ML-% IV SOLN
2.0000 g | Freq: Once | INTRAVENOUS | Status: AC
  Administered 2020-11-10: 2000 mg via INTRAVENOUS
  Filled 2020-11-10: qty 100

## 2020-11-10 MED ORDER — SODIUM CHLORIDE 0.9 % IV SOLN
250.0000 mL | INTRAVENOUS | Status: AC
  Administered 2020-11-10: 1000 mL via INTRAVENOUS

## 2020-11-10 MED ORDER — SODIUM PHOSPHATES 15 MMOLE/5ML IV SOLN
15.0000 mmol | Freq: Once | INTRAVENOUS | Status: DC
  Filled 2020-11-10: qty 5

## 2020-11-10 MED ORDER — SODIUM CHLORIDE 0.9% FLUSH
10.0000 mL | Freq: Two times a day (BID) | INTRAVENOUS | Status: DC
  Administered 2020-11-11 – 2020-11-13 (×4): 10 mL
  Administered 2020-11-14: 20 mL
  Administered 2020-11-14: 10 mL
  Administered 2020-11-15: 20 mL
  Administered 2020-11-15 – 2020-11-22 (×12): 10 mL

## 2020-11-10 MED ORDER — SODIUM CHLORIDE 0.9 % IV SOLN: INTRAVENOUS | Status: AC

## 2020-11-10 MED ORDER — TRAVASOL 10 % IV SOLN
INTRAVENOUS | Status: AC
  Filled 2020-11-10: qty 518.4

## 2020-11-10 MED ORDER — SODIUM PHOSPHATES 45 MMOLE/15ML IV SOLN
45.0000 mmol | Freq: Once | INTRAVENOUS | Status: AC
  Administered 2020-11-10: 45 mmol via INTRAVENOUS
  Filled 2020-11-10 (×2): qty 15

## 2020-11-10 NOTE — Consult Note (Signed)
[place] Nurse ostomy follow up Patient receiving care in [facility] 2M14 Stoma type/location: LLQ end colostomy Stomal assessment/size: 1 3/4" budded, pink, moist Peristomal assessment: Intact Output: Liquid [place] Ostomy pouching: 2pc. 2 3/4" Kellie Simmering # 2) with 2 3/4" skin barrier Kellie Simmering # 2) Education provided: Patient was awake and alert enough for me to walk her through the entire process of removing and replacing pouch. Not teachable at this point and husband was not present. States she is supposed to go to a rehab facility when she is discharged.  Enrolled patient in [place] Start Discharge program: Yes previously  [place] Nurse will follow along with you for continued support with ostomy teaching and care  [place]. Tamala Julian, MSN, RN, [place], Lysle Pearl, [facility] Wound Treatment Associate Pager [phone]

## 2020-11-10 NOTE — Progress Notes (Signed)
OT Cancellation Note  Patient Details Name: RAIANA PHARRIS MRN: [id number] DOB: 11-Nov-1951   Cancelled Treatment:    Reason Eval/Treat Not Completed: Fatigue/lethargy limiting ability to participate- pt fatigued and resting per RN, request OT to return as able later today or tomorrow.    Jolaine Artist, OT Acute Rehabilitation Services Pager [phone] Office [phone]   Delight Stare 11/10/2020, 1:43 PM

## 2020-11-10 NOTE — Progress Notes (Signed)
Initial Nutrition Assessment  DOCUMENTATION CODES:   Not applicable  INTERVENTION:   TPN dosing per Pharmacy to meet nutrition needs.  NUTRITION DIAGNOSIS:   Inadequate oral intake related to altered GI function as evidenced by NPO status.  GOAL:   Patient will meet greater than or equal to 90% of their needs  MONITOR:   Labs, I & O's, Diet advancement  REASON FOR ASSESSMENT:   Consult New TPN/TNA  ASSESSMENT:   69 yo female admitted with perforated sigmoid diverticulitis. 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 PMH includes HTN, HLD, osteopenia, chronic pain, recent R humerus fracture.   Discussed patient in ICU rounds and with RN today. Also spoke with patient and her son at bedside. Patient is typically a very healthy eater. She has lost weight with Weight Watchers and has kept her weight stable ~135-140 lbs for several years. She was eating well at home up until hospital admission.  Patient is expected to require prolonged bowel rest, so TPN is being initiated today. PICC is being placed today. Current orders for TPN to begin later today at 40 ml/h to provide 1023 kcal and 52 gm protein per day.  NG tube in place to LIWS. 400 ml bilious output x 24 hours. Colostomy pouch with liquid Shiffler contents.   Usual weight 65.8 kg 03/10/20, 59.9 kg on admission, currently 67.4 kg  Labs reviewed. Sodium 131, potassium 5.8, prealbumin < 5 CBG: 124-122-84  Medications reviewed and include calcium gluconate. Received potassium phosphate, 6 runs of potassium chloride, and magnesium sulfate for repletion 11/16.    NUTRITION - FOCUSED PHYSICAL EXAM:    Most Recent Value  Orbital Region No depletion  Upper Arm Region No depletion  Thoracic and Lumbar Region No depletion  Buccal Region No depletion  Temple Region No depletion  Clavicle Bone Region No depletion  Clavicle and Acromion Bone Region No depletion  Scapular Bone Region No depletion  Dorsal Hand Unable to assess  Patellar  Region No depletion  Anterior Thigh Region No depletion  Posterior Calf Region No depletion  Edema (RD Assessment) None  Hair Reviewed  Eyes Reviewed  Mouth Reviewed  Skin Reviewed  Nails Reviewed       Diet Order:   Diet Order            Diet NPO time specified Except for: Sips with Meds, Ice Chips  Diet effective now                 EDUCATION NEEDS:   No education needs have been identified at this time  Skin:  Skin Assessment: Skin Integrity Issues: Skin Integrity Issues:: Incisions Incisions: abdomen  Last BM:  colostomy with liquid Reger output 11/17  Height:   Ht Readings from Last 1 Encounters:  11/08/20 5\' 3"  (1.6 m)    Weight:   Wt Readings from Last 1 Encounters:  11/08/20 67.4 kg    Ideal Body Weight:  52.3 kg  BMI:  Body mass index is 26.32 kg/m.  Estimated Nutritional Needs:   Kcal:  1650-1850  Protein:  90-110 gm  Fluid:  >/= 1.7 L    Lucas Mallow, RD, LDN, CNSC Please refer to Amion for contact information.

## 2020-11-10 NOTE — NC FL2 (Signed)
[place] LEVEL OF CARE SCREENING TOOL     IDENTIFICATION  Patient Name: Chloe Young Birthdate: 11-22-1951 Sex: female Admission Date (Current Location): 11/08/2020  [facility] and [place] Number:  Herbalist and Address:  [facility] [place]. [facility], [place] [address] [place], [place] [address]      Provider Number: [id number]  Attending Physician Name and Address:  Tawni Millers  Relative Name and Phone Number:       Current Level of Care: Hospital Recommended Level of Care: [place] Prior Approval Number:    Date Approved/Denied:   PASRR Number: [id number]  Discharge Plan: SNF    Current Diagnoses: Patient Active Problem List   Diagnosis Date Noted  . Severe sepsis ([place]) 11/08/2020  . Perforated abdominal viscus   . Chronic nasal congestion 03/10/2020  . Lumbar radiculopathy 06/15/2016  . Cervicogenic headache 06/15/2016  . Osteopenia 06/15/2016  . GERD (gastroesophageal reflux disease) 06/15/2016  . Fasting hyperglycemia 11/25/2013  . Nonspecific abnormal electrocardiogram (ECG) (EKG) 05/28/2012  . Cervical post-laminectomy syndrome 03/18/2012  . Osteoarthritis of both knees 03/18/2012  . RAYNAUD'S SYNDROME 10/11/2010  . NECK PAIN, CHRONIC 04/19/2010  . Hyperlipidemia 04/19/2009  . Essential hypertension 04/19/2009    Orientation RESPIRATION BLADDER Height & Weight     Self, Time, Situation, Place  Normal External catheter Weight: 148 lb 9.4 oz (67.4 kg) Height:  5\' 3"  (160 cm)  BEHAVIORAL SYMPTOMS/MOOD NEUROLOGICAL BOWEL NUTRITION STATUS      Colostomy (Ostomy pouch: 2 pieces. Stoma Assessment: Pinnk;Red. Peristonal Assessment: Intact) NG/panda  AMBULATORY STATUS COMMUNICATION OF NEEDS Skin   Limited Assist Verbally Surgical wounds (Incision (closed) Abdomen: ABD dresing. Change twice a day)                       Personal Care Assistance Level of Assistance  Bathing, Feeding, Dressing  Bathing Assistance: Limited assistance Feeding assistance: Independent Dressing Assistance: Limited assistance     Functional Limitations Info  Sight, Hearing, Speech Sight Info: Adequate Hearing Info: Adequate Speech Info: Adequate    SPECIAL CARE FACTORS FREQUENCY  PT (By licensed PT), OT (By licensed OT)     PT Frequency: 5xweek OT Frequency: 5xweek            Contractures Contractures Info: Not present    Additional Factors Info  Code Status, Allergies Code Status Info: Full Allergies Info: No Known Allergies           Current Medications (11/10/2020):  This is the current hospital active medication list Current Facility-Administered Medications  Medication Dose Route Frequency Provider Last Rate Last Admin  . 0.9 %  sodium chloride infusion  250 mL Intravenous Continuous Sloan Leiter B, RPH 100 mL/hr at 11/10/20 1004 1,000 mL at 11/10/20 1004  . 0.9 %  sodium chloride infusion   Intravenous Continuous Priscella Mann, RPH      . acetaminophen (OFIRMEV) IV 1,000 mg  1,000 mg Intravenous Q6H Norm Parcel, PA-C      . Chlorhexidine Gluconate Cloth 2 % PADS 6 each  6 each Topical Daily Rigoberto Noel, MD   6 each at 11/09/20 1154  . enoxaparin (LOVENOX) injection 40 mg  40 mg Subcutaneous Daily Norm Parcel, PA-C   40 mg at 11/10/20 1004  . HYDROmorphone (DILAUDID) injection 0.5 mg  0.5 mg Intravenous Q4H PRN Seawell, Jaimie A, DO   0.5 mg at 11/10/20 0830  . insulin aspart (novoLOG) injection  0-9 Units  0-9 Units Subcutaneous Q4H Stark Klein, MD   1 Units at 11/10/20 0354  . methocarbamol (ROBAXIN) 1,000 mg in dextrose 5 % 100 mL IVPB  1,000 mg Intravenous Q8H Norm Parcel, PA-C 200 mL/hr at 11/10/20 1207 1,000 mg at 11/10/20 1207  . ondansetron (ZOFRAN) injection 4 mg  4 mg Intravenous Q6H PRN Stark Klein, MD   4 mg at 11/10/20 1002  . pantoprazole (PROTONIX) injection 40 mg  40 mg Intravenous QHS Stark Klein, MD   40 mg at 11/09/20 2000  .  piperacillin-tazobactam (ZOSYN) IVPB 3.375 g  3.375 g Intravenous Once Stark Klein, MD       Followed by  . piperacillin-tazobactam (ZOSYN) IVPB 3.375 g  3.375 g Intravenous Q8H Stark Klein, MD 12.5 mL/hr at 11/10/20 1007 3.375 g at 11/10/20 1007  . topiramate (TOPAMAX) tablet 200 mg  200 mg Oral BID Stark Klein, MD   200 mg at 11/09/20 2000  . TPN ADULT (ION)   Intravenous Continuous TPN Priscella Mann, [facility]         Discharge Medications: Please see discharge summary for a list of discharge medications.  Relevant Imaging Results:  Relevant Lab Results:   Additional Information [id number]  [place], Student-Social Work

## 2020-11-10 NOTE — Progress Notes (Signed)
Peripherally Inserted Central Catheter Placement  The IV Nurse has discussed with the patient and/or persons authorized to consent for the patient, the purpose of this procedure and the potential benefits and risks involved with this procedure.  The benefits include less needle sticks, lab draws from the catheter, and the patient may be discharged home with the catheter. Risks include, but not limited to, infection, bleeding, blood clot (thrombus formation), and puncture of an artery; nerve damage and irregular heartbeat and possibility to perform a PICC exchange if needed/ordered by physician.  Alternatives to this procedure were also discussed.  Bard Power PICC patient education guide, fact sheet on infection prevention and patient information card has been provided to patient /or left at bedside.    PICC Placement Documentation  PICC Double Lumen 11/10/20 PICC Left Brachial 41 cm 2 cm (Active)  Indication for Insertion or Continuance of Line Administration of hyperosmolar/irritating solutions (i.e. TPN, Vancomycin, etc.) 11/10/20 1232  Exposed Catheter (cm) 0 cm 11/10/20 1232  Site Assessment Clean;Dry;Intact 11/10/20 1232  Lumen #1 Status Flushed;Blood return noted 11/10/20 1232  Lumen #2 Status Flushed;Blood return noted;Saline locked 11/10/20 1232  Dressing Type Transparent 11/10/20 1232  Dressing Status Clean;Dry;Intact 11/10/20 1232  Antimicrobial disc in place? Yes 11/10/20 1232  Dressing Change Due 11/17/20 11/10/20 1232       Scotty Court 11/10/2020, 12:34 PM

## 2020-11-10 NOTE — Progress Notes (Addendum)
PROGRESS NOTE    Chloe Young  ZOX:[id number] DOB: 1951/02/10 DOA: 11/08/2020 PCP: Binnie Rail, MD    Brief Narrative:   Mrs. Connolly was admitted to the hospital working diagnosis of septic shock due to acute bowel perforation and peritonitis.  69 year old female with past medical history for hypertension, dyslipidemia, chronic pain syndrome, osteopenia, status post surgical laminectomy who presented with abdominal pain.  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  She reported abdominal pain, with nausea and vomiting.  EMS performed EKG which was suggestive of STEMI.  On her initial physical examination her temperature was 33.1, heart rate 130s, she was tachypneic and hypotensive.  Her lungs were clear to auscultation bilaterally, heart S1/S2, present, tachycardic, the abdomen was distended, diffusely tender to palpation.  CT of the abdomen with acute bowel perforation.  Suspected small bowel.  Patient was taken to the OR for sigmoid colectomy with end colostomy.  She developed destructive shock with persistent postoperative hypotension.  Patient required vasopressors and close monitoring in the ICU.  Supportive medical therapy with antibiotics and analgesics.  Transferred to [facility] 11/10/2020  Assessment & Plan:   Principal Problem:   Peritonitis ([place]) Active Problems:   Hyperlipidemia   Essential hypertension   GERD (gastroesophageal reflux disease)   Severe sepsis (HCC)   Perforated abdominal viscus   1. Septic shock due to peritonitis, due to perforated  bowel. Sp sigmoid colectomy and colostomy. Not recovered bowel function yet.  Has persistent abdominal pain, no nausea or vomiting. NG tube in place with 400 cc over last 24 H.   Continue supportive medical care, IV analgesic, antiacids and antiemetics.  Broad spectrum antibiotic therapy with IV Zosyn.  Continue with TPN per pharmacy protocol.   2. Acute right humeral fracture. Continue pain control and  immbolization of the right upper extremity.   3. Alcohol use. 1 to 2 drinks per night, no signs of withdrawal, continue neuro check per unit protocol.   4. Hypertension. Continue blood pressure monitoring, continue to hold on hctz and metoprolol.      Patient continue to be at high risk for worsening postoperative ileus.   Status is: Inpatient  Remains inpatient appropriate because:IV treatments appropriate due to intensity of illness or inability to take PO   Dispo: The patient is from: Home              Anticipated d/c is to: Home              Anticipated d/c date is: 3 days              Patient currently is not medically stable to d/c.   DVT prophylaxis: Enoxaparin   Code Status:   full  Family Communication:  I spoke with patient's son at the bedside, we talked in detail about patient's condition, plan of care and prognosis and all questions were addressed.      Nutrition Status: Nutrition Problem: Inadequate oral intake Etiology: altered GI function Signs/Symptoms: NPO status Interventions: TPN     Skin Documentation:     Consultants:   Surgery   Procedures:  sigmoid colectomy and end colostomy (Hartmann's procedure  Antimicrobials:   IV zosyn     Subjective: Patient continue to have abdominal pain, no appetite, no nausea or vomiting. Limited mobility due to pain, no dyspnea or chest pain.   Objective: Vitals:   11/10/20 1200 11/10/20 1202 11/10/20 1300 11/10/20 1400  BP: (!) 145/77  (!) 130/49 Marland Kitchen)  134/52  Pulse: 92  98 98  Resp: 20  17 15   Temp:  98.4 F (36.9 C)    TempSrc:  Oral    SpO2: 96%  94% 96%  Weight:      Height:        Intake/Output Summary (Last 24 hours) at 11/10/2020 1604 Last data filed at 11/10/2020 1400 Gross per 24 hour  Intake 1577.69 ml  Output 450 ml  Net 1127.69 ml   Filed Weights   11/08/20 1304 11/08/20 2209  Weight: 59.9 kg 67.4 kg    Examination:   General: Not in pain or dyspnea, deconditioned   Neurology: Awake and alert, non focal  E ENT: mild pallor, no icterus, oral mucosa moist. NG tube in place.  Cardiovascular: No JVD. S1-S2 present, rhythmic, no gallops, rubs, or murmurs. No lower extremity edema. Pulmonary: positive breath sounds bilaterally, adequate air movement, no wheezing, rhonchi or rales. Gastrointestinal. Abdomen soft, tender to superficial palpation, colostomy bag in place,. Surgical wound with dressing in place.  Skin. No rashes Musculoskeletal: no joint deformities     Data Reviewed: I have personally reviewed following labs and imaging studies  CBC: Recent Labs  Lab 11/08/20 1236 11/08/20 1246 11/09/20 0805 11/09/20 1149 11/10/20 0142  WBC 9.0  --  3.5* 4.2 4.7  NEUTROABS 7.0  --   --   --   --   HGB 13.2 13.3 8.4* 8.2* 8.8*  HCT 40.9 39.0 25.3* 25.3* 26.8*  MCV 97.8  --  94.4 94.4 97.5  PLT 273  --  163 160 768   Basic Metabolic Panel: Recent Labs  Lab 11/08/20 1236 11/08/20 1246 11/09/20 0805 11/10/20 0142  NA 135 136 137 131*  K 3.6 3.5 2.8* 5.8*  CL 102 102 109 107  CO2 18*  --  21* 18*  GLUCOSE 157* 155* 122* 567*  BUN 14 16 9 9   CREATININE 1.01* 0.90 0.64 0.67  CALCIUM 8.6*  --  7.4* 6.8*  MG  --   --  1.6* 2.2  PHOS  --   --  1.7* 1.5*   GFR: Estimated Creatinine Clearance: 60.3 mL/min (by C-G formula based on SCr of 0.67 mg/dL). Liver Function Tests: Recent Labs  Lab 11/08/20 1236 11/10/20 0142  AST 54* 21  ALT 34 20  ALKPHOS 40 29*  BILITOT 1.5* 0.6  PROT 7.3 5.0*  ALBUMIN 3.5 2.4*   Recent Labs  Lab 11/08/20 1236  LIPASE 25   No results for input(s): AMMONIA in the last 168 hours. Coagulation Profile: Recent Labs  Lab 11/08/20 1236  INR 1.3*   Cardiac Enzymes: No results for input(s): CKTOTAL, CKMB, CKMBINDEX, TROPONINI in the last 168 hours. BNP (last 3 results) No results for input(s): PROBNP in the last 8760 hours. HbA1C: Recent Labs    11/09/20 0805  HGBA1C 5.3   CBG: Recent Labs  Lab  11/09/20 1949 11/09/20 2344 11/10/20 0341 11/10/20 0818 11/10/20 1144  GLUCAP 122* 140* 124* 122* 84   Lipid Profile: No results for input(s): CHOL, HDL, LDLCALC, TRIG, CHOLHDL, LDLDIRECT in the last 72 hours. Thyroid Function Tests: No results for input(s): TSH, T4TOTAL, FREET4, T3FREE, THYROIDAB in the last 72 hours. Anemia Panel: No results for input(s): VITAMINB12, FOLATE, FERRITIN, TIBC, IRON, RETICCTPCT in the last 72 hours.    Radiology Studies: I have reviewed all of the imaging during this hospital visit personally     Scheduled Meds: . Chlorhexidine Gluconate Cloth  6 each Topical Daily  .  enoxaparin (LOVENOX) injection  40 mg Subcutaneous Daily  . insulin aspart  0-9 Units Subcutaneous Q4H  . pantoprazole (PROTONIX) IV  40 mg Intravenous QHS  . sodium chloride flush  10-40 mL Intracatheter Q12H   Continuous Infusions: . sodium chloride 1,000 mL (11/10/20 1004)  . sodium chloride    . acetaminophen 1,000 mg (11/10/20 1316)  . methocarbamol (ROBAXIN) IV 1,000 mg (11/10/20 1207)  . piperacillin-tazobactam     Followed by  . piperacillin-tazobactam (ZOSYN)  IV 3.375 g (11/10/20 1524)  . sodium phosphate  Dextrose 5% IVPB 45 mmol (11/10/20 1351)  . TPN ADULT (ION)       LOS: 2 days        Josephine Rudnick Gerome Apley, MD

## 2020-11-10 NOTE — TOC Initial Note (Signed)
Transition of Care [facility]) - Initial/Assessment Note    Patient Details  Name: Chloe Young MRN: [id number] Date of Birth: Oct 15, 1951  [facility] CM/SW Contact:    Archie Endo, LCSW Phone Number: 11/10/2020, 12:25 PM  Clinical Narrative:                 CSW spoke with patient at bedside to complete discussion regarding the PT recommendation for SNF. Patient is agreeable to short term rehab at [facility]. Patient reports she has never been to a facility before and does not have a preference other than it being in [facility].  FL2 is completed, patient's information will be faxed out for review.  Expected Discharge Plan: Skilled Nursing Facility Barriers to Discharge: Continued Medical Work up, Ship broker   Patient Goals and CMS Choice        Expected Discharge Plan and Services Expected Discharge Plan: [place]                                              Prior Living Arrangements/Services   Lives with:: Spouse Patient language and need for interpreter reviewed:: Yes Do you feel safe going back to the place where you live?: Yes      Need for Family Participation in Patient Care: Yes (Comment) Care giver support system in place?: Yes (comment)   Criminal Activity/Legal Involvement Pertinent to Current Situation/Hospitalization: No - Comment as needed  Activities of Daily Living      Permission Sought/Granted                  Emotional Assessment Appearance:: Appears stated age Attitude/Demeanor/Rapport: Engaged Affect (typically observed): Accepting Orientation: : Oriented to Self, Oriented to Place, Oriented to  Time, Oriented to Situation Alcohol / Substance Use: Not Applicable Psych Involvement: No (comment)  Admission diagnosis:  Perforated abdominal viscus [R19.8] Sepsis ([place]) [A41.9] Partial intestinal obstruction, unspecified cause ([place]) [K56.600] Sepsis, due to unspecified organism,  unspecified whether acute organ dysfunction present [facility]) [A41.9] Patient Active Problem List   Diagnosis Date Noted   Severe sepsis ([place]) 11/08/2020   Perforated abdominal viscus    Chronic nasal congestion 03/10/2020   Lumbar radiculopathy 06/15/2016   Cervicogenic headache 06/15/2016   Osteopenia 06/15/2016   GERD (gastroesophageal reflux disease) 06/15/2016   Fasting hyperglycemia 11/25/2013   Nonspecific abnormal electrocardiogram (ECG) (EKG) 05/28/2012   Cervical post-laminectomy syndrome 03/18/2012   Osteoarthritis of both knees 03/18/2012   RAYNAUD'S SYNDROME 10/11/2010   NECK PAIN, CHRONIC 04/19/2010   Hyperlipidemia 04/19/2009   Essential hypertension 04/19/2009   PCP:  Binnie Rail, MD Pharmacy:   [facility], [place] [address] Phone: [phone] Fax: [facility], [place], [place] [address], [place] [address] Phone: [phone] Fax: [phone]     Social Determinants of Health (SDOH) Interventions    Readmission Risk Interventions No flowsheet data found.

## 2020-11-10 NOTE — Progress Notes (Signed)
2 Days Post-Op   Subjective/Chief Complaint: Pt with better abd pain NGT in place   Objective: Vital signs in last 24 hours: Temp:  [98.1 F (36.7 C)-99.7 F (37.6 C)] 98.4 F (36.9 C) (11/17 0353) Pulse Rate:  [85-127] 87 (11/17 0242) Resp:  [10-25] 12 (11/17 0242) BP: (99-142)/(45-75) 120/49 (11/17 0200) SpO2:  [88 %-97 %] 93 % (11/17 0242) Last BM Date:  (PTA)  Intake/Output from previous day: 11/16 0701 - 11/17 0700 In: 2249 [I.V.:1607.1; IV Piggyback:641.9] Out: 1550 [Urine:1150; Emesis/NG output:400] Intake/Output this shift: No intake/output data recorded.  General appearance: alert and cooperative GI: soft, non-tender; bowel sounds normal; no masses,  no organomegaly and inc c/d/i, ostomy pink, bowel sweat  Lab Results:  Recent Labs    11/09/20 1149 11/10/20 0142  WBC 4.2 4.7  HGB 8.2* 8.8*  HCT 25.3* 26.8*  PLT 160 175   BMET Recent Labs    11/09/20 0805 11/10/20 0142  NA 137 131*  K 2.8* 5.8*  CL 109 107  CO2 21* 18*  GLUCOSE 122* 567*  BUN 9 9  CREATININE 0.64 0.67  CALCIUM 7.4* 6.8*   PT/INR Recent Labs    11/08/20 1236  LABPROT 15.3*  INR 1.3*   ABG No results for input(s): PHART, HCO3 in the last 72 hours.  Invalid input(s): PCO2, PO2  Studies/Results: DG Abdomen 1 View  Result Date: 11/08/2020 CLINICAL DATA:  NG tube placement EXAM: ABDOMEN - 1 VIEW COMPARISON:  CT earlier today. FINDINGS: NG tube is within the stomach. Free air noted under the right hemidiaphragm as seen on prior CT. IMPRESSION: NG tube in the stomach. Electronically Signed   By: Rolm Baptise M.D.   On: 11/08/2020 17:44   CT ABDOMEN PELVIS W CONTRAST  Result Date: 11/08/2020 CLINICAL DATA:  69 year old female with history of abdominal pain. Fever. EXAM: CT ABDOMEN AND PELVIS WITH CONTRAST TECHNIQUE: Multidetector CT imaging of the abdomen and pelvis was performed using the standard protocol following bolus administration of intravenous contrast. CONTRAST:   153mL OMNIPAQUE IOHEXOL 300 MG/ML  SOLN COMPARISON:  No priors. FINDINGS: Lower chest: Subsegmental atelectasis noted in the lower lobes of the lungs bilaterally. Hepatobiliary: No suspicious cystic or solid hepatic lesions. No intra or extrahepatic biliary ductal dilatation. Gallbladder is unremarkable in appearance. Pancreas: No pancreatic mass. No pancreatic ductal dilatation. No pancreatic or peripancreatic fluid collections or inflammatory changes. Spleen: Unremarkable. Adrenals/Urinary Tract: Bilateral kidneys and adrenal glands are normal in appearance. No hydroureteronephrosis. Urinary bladder is normal in appearance. Stomach/Bowel: Stomach is unremarkable in appearance. There are numerous dilated loops of proximal to mid small bowel, with gradual transition to nondilated loops of mid to distal small bowel. The exact transition point is difficult to localize, however, in the low anatomic pelvis there are several loops of thickened small bowel which demonstrate associated mucosal hyperenhancement, with some surrounding fluid and small amount of extraluminal gas, concerning for regional enteritis affecting predominantly the mid to distal jejunum and/or proximal ileum. Terminal ileum is decompressed, but otherwise grossly unremarkable in appearance. Appendix is normal. Colon is relatively decompressed. There are numerous colonic diverticuli, particularly in the sigmoid colon. There are some mild inflammatory changes in the sigmoid colon, without definitive focal inflammatory changes to clearly indicate an acute diverticulitis at this time. Vascular/Lymphatic: Aortic atherosclerosis without evidence of aneurysm or dissection in the abdominal or pelvic vasculature. No lymphadenopathy noted in the abdomen or pelvis. Reproductive: Uterus and ovaries are unremarkable in appearance. Other: Small volume of ascites most evident  in the central small bowel mesentery adjacent to the inflamed loops of small bowel. Small  amount of pneumoperitoneum, also most evident in the small bowel mesentery adjacent to the inflamed loops of small bowel, and in the upper abdomen adjacent to the liver. Musculoskeletal: There are no aggressive appearing lytic or blastic lesions noted in the visualized portions of the skeleton. IMPRESSION: 1. Findings are compatible with an acute bowel perforation. The exact site of perforation is uncertain, however, the favored candidate is the mid small bowel (likely distal jejunum or proximal ileum) where there are multiple loops of thickened and inflamed small bowel with adjacent fluid and gas in the small bowel mesentery. Associated with this there is a partial small bowel obstruction, as above. 2. There is also colonic diverticulosis. There are some subtle inflammatory changes in the sigmoid mesocolon. The possibility of an acute diverticulitis is not excluded, but not strongly favored on the basis of today's examination; however, direct inspection at time of potential laparoscopy is suggested as this could alternatively represent this site of perforation. 3. Aortic atherosclerosis. Critical Value/emergent results were called by telephone at the time of interpretation on 11/08/2020 at 3:09 pm to provider [facility], who verbally acknowledged these results. Electronically Signed   By: Vinnie Langton M.D.   On: 11/08/2020 15:10   DG Chest Portable 1 View  Result Date: 11/08/2020 CLINICAL DATA:  Right-sided chest pain EXAM: PORTABLE CHEST 1 VIEW COMPARISON:  07/24/2011 FINDINGS: The heart size and mediastinal contours are within normal limits. Lung volumes with mild bibasilar atelectasis. No pleural effusion or pneumothorax. There is an acute fracture of the proximal right humerus with transverse component involving the surgical neck as well as a nondisplaced component involving the greater tuberosity. Fracture is mildly displaced and angulated. Glenohumeral joint alignment appears maintained without  dislocation. IMPRESSION: 1. Acute fracture of the proximal right humerus. 2. Low lung volumes with mild bibasilar atelectasis. Electronically Signed   By: Davina Poke D.O.   On: 11/08/2020 13:03   [place] EKG SITE RITE  Result Date: 11/10/2020 If Site Rite image not attached, placement could not be confirmed due to current cardiac rhythm.   Anti-infectives: Anti-infectives (From admission, onward)   Start     Dose/Rate Route Frequency Ordered Stop   11/09/20 2330  piperacillin-tazobactam (ZOSYN) IVPB 3.375 g       "Followed by" Linked Group Details   3.375 g 12.5 mL/hr over 240 Minutes Intravenous Every 8 hours 11/08/20 1704     11/08/20 2200  ceFEPIme (MAXIPIME) 2 g in sodium chloride 0.9 % 100 mL IVPB  Status:  Discontinued        2 g 200 mL/hr over 30 Minutes Intravenous Every 12 hours 11/08/20 1335 11/08/20 1638   11/08/20 1830  piperacillin-tazobactam (ZOSYN) IVPB 3.375 g        3.375 g 12.5 mL/hr over 240 Minutes Intravenous To Surgery 11/08/20 1823 11/09/20 1830   11/08/20 1715  piperacillin-tazobactam (ZOSYN) IVPB 3.375 g       "Followed by" Linked Group Details   3.375 g 100 mL/hr over 30 Minutes Intravenous  Once 11/08/20 1704     11/08/20 1245  ceFEPIme (MAXIPIME) 2 g in sodium chloride 0.9 % 100 mL IVPB        2 g 200 mL/hr over 30 Minutes Intravenous  Once 11/08/20 1243 11/08/20 1338      Assessment/Plan: HTN HLD Chronic pain Recent hx of R humerus fracture - She is being followed by Dr. Veverly Fells as  an outpatient, will notify that patient is admitted   Perforated sigmoid diverticulitis  S/p Hartmann's procedure 11/08/20 Dr. Barry Dienes - POD#2 - stoma edematous but as expected this early post-op - WOC following - NPO and NGT to LIWS today - drainage is bilious and stoma without any output, expect ileus  - mobilize as able - BID dressing changes - added IV tylenol and IV robaxin for pain control  - low alb/prealbumin  FEN: NPO, IVF, NGT to LIWS, will start  TNA/PICC VTE: SCDs, start lovenox today ID: cefepime 11/15; IV Zosyn 11/15>>  LOS: 2 days    Ralene Ok 11/10/2020

## 2020-11-10 NOTE — Progress Notes (Addendum)
PHARMACY - TOTAL PARENTERAL NUTRITION CONSULT NOTE   Indication: Prolonged ileus expected  Patient Measurements: Height: 5\' 3"  (160 cm) Weight: 67.4 kg (148 lb 9.4 oz) IBW/kg (Calculated) : 52.4   Body mass index is 26.32 kg/m.  Assessment: 69 year old female with perforated sigmoid diverticulitis who underwent Hartmann's procedure on 11/08/20. Patient is now POD#2 and is NPO with NGT to low intermittent suction. Drainage is bilious and 458mL over last 24 hours. Pre-albumin is significantly low at <5 and Surgery expects ileus. Pharmacy consulted to start TPN.   Patient reports alcohol use at home (~14 standard drinks of alcohol per week). Patient reports that she was eating well up until the pain started. Patient is in a good amount of pain this morning - Surgery in room as well and addressing this issue.   Glucose / Insulin: CBGs 116-120 (suspect one level of 567 was contaminated by dextrose in IVF/IV medications).  Electrolytes: K is elevated at 5.8 due to over repletion of low K yesterday withK in IVFs which has now been removed. Ca low - 2 g given in setting of high K per Surgery. Na low at 131. CO2 low at 18.  Renal: SCr 0.67 -stable. Good UOP 0.7 mL/kg/hr. LFTs / TGs: LFTs are within normal limits. Last TG in March was 56.  Prealbumin / albumin: Pre-albumin <5 Intake / Output; MIVF: 400 mL of emesis from NGT. Net +5.4L.. NS at 100 mL/hr GI Imaging: None since start of TPN Surgeries / Procedures: None since start of TPN  Central access: PICC ordered to be placed 11/10/20 TPN start date: 11/10/20  Nutritional Goals (per RD recommendation on pending): Goal TPN rate is 85 mL/hr   Current Nutrition:  NPO except sips with medications  Plan:  Start TPN at 61mL/hr at 1800 Electrolytes in TPN: 56mEq/L of Na, 59mEq/L of K, 69mEq/L of Ca, 43mEq/L of Mg, and 73mmol/L of Phos -No K and Phos in TPN at this time due to elevated K and shortage of NaPhos - will replace Phos outside of TPN.  EG:BTDVVOH 1:2 for now.   Discontinue oral MVI due to pain and questionable absorption. Add standard MVI and trace elements to TPN Discontinue oral Thiamine and Folic Acid due to pain and questionable absorption.  Add Thiamine and Folic Acid to TPN.  Continue Sensitive q4h SSI and adjust as needed  Reduce MIVF to 60 mL/hr at 1800 Monitor TPN labs on Mon/Thurs, Daily for at least 3 days  Recheck Phos now - will replete with Na Phos outside of TPN (exception in shortage due to K 5.8).   Sloan Leiter, PharmD, BCPS, BCCCP Clinical Pharmacist Please refer to [facility] for [place] numbers 11/10/2020,8:09 AM

## 2020-11-11 DIAGNOSIS — K219 Gastro-esophageal reflux disease without esophagitis: Secondary | ICD-10-CM | POA: Diagnosis not present

## 2020-11-11 DIAGNOSIS — R198 Other specified symptoms and signs involving the digestive system and abdomen: Secondary | ICD-10-CM | POA: Diagnosis not present

## 2020-11-11 DIAGNOSIS — K659 Peritonitis, unspecified: Secondary | ICD-10-CM | POA: Diagnosis not present

## 2020-11-11 DIAGNOSIS — I10 Essential (primary) hypertension: Secondary | ICD-10-CM | POA: Diagnosis not present

## 2020-11-11 LAB — DIFFERENTIAL
Abs Immature Granulocytes: 0.05 10*3/uL (ref 0.00–0.07)
Basophils Absolute: 0 10*3/uL (ref 0.0–0.1)
Basophils Relative: 0 %
Eosinophils Absolute: 0.1 10*3/uL (ref 0.0–0.5)
Eosinophils Relative: 2 %
Immature Granulocytes: 1 %
Lymphocytes Relative: 9 %
Lymphs Abs: 0.6 10*3/uL — ABNORMAL LOW (ref 0.7–4.0)
Monocytes Absolute: 0.4 10*3/uL (ref 0.1–1.0)
Monocytes Relative: 6 %
Neutro Abs: 5.7 10*3/uL (ref 1.7–7.7)
Neutrophils Relative %: 82 %

## 2020-11-11 LAB — CBC
HCT: 26.2 % — ABNORMAL LOW (ref 36.0–46.0)
Hemoglobin: 8.3 g/dL — ABNORMAL LOW (ref 12.0–15.0)
MCH: 31 pg (ref 26.0–34.0)
MCHC: 31.7 g/dL (ref 30.0–36.0)
MCV: 97.8 fL (ref 80.0–100.0)
Platelets: 233 10*3/uL (ref 150–400)
RBC: 2.68 MIL/uL — ABNORMAL LOW (ref 3.87–5.11)
RDW: 14 % (ref 11.5–15.5)
WBC: 6.9 10*3/uL (ref 4.0–10.5)
nRBC: 0 % (ref 0.0–0.2)

## 2020-11-11 LAB — COMPREHENSIVE METABOLIC PANEL
ALT: 24 U/L (ref 0–44)
AST: 21 U/L (ref 15–41)
Albumin: 2.2 g/dL — ABNORMAL LOW (ref 3.5–5.0)
Alkaline Phosphatase: 39 U/L (ref 38–126)
Anion gap: 5 (ref 5–15)
BUN: 9 mg/dL (ref 8–23)
CO2: 24 mmol/L (ref 22–32)
Calcium: 7.9 mg/dL — ABNORMAL LOW (ref 8.9–10.3)
Chloride: 107 mmol/L (ref 98–111)
Creatinine, Ser: 0.56 mg/dL (ref 0.44–1.00)
GFR, Estimated: >60 mL/min (ref >60)
Glucose, Bld: 122 mg/dL — ABNORMAL HIGH (ref 70–99)
Potassium: 3 mmol/L — ABNORMAL LOW (ref 3.5–5.1)
Sodium: 136 mmol/L (ref 135–145)
Total Bilirubin: 0.3 mg/dL (ref 0.3–1.2)
Total Protein: 4.9 g/dL — ABNORMAL LOW (ref 6.5–8.1)

## 2020-11-11 LAB — GLUCOSE, CAPILLARY
Glucose-Capillary: 104 mg/dL — ABNORMAL HIGH (ref 70–99)
Glucose-Capillary: 105 mg/dL — ABNORMAL HIGH (ref 70–99)
Glucose-Capillary: 117 mg/dL — ABNORMAL HIGH (ref 70–99)
Glucose-Capillary: 117 mg/dL — ABNORMAL HIGH (ref 70–99)
Glucose-Capillary: 118 mg/dL — ABNORMAL HIGH (ref 70–99)
Glucose-Capillary: 122 mg/dL — ABNORMAL HIGH (ref 70–99)
Glucose-Capillary: 132 mg/dL — ABNORMAL HIGH (ref 70–99)

## 2020-11-11 LAB — MAGNESIUM: Magnesium: 2.2 mg/dL (ref 1.7–2.4)

## 2020-11-11 LAB — TRIGLYCERIDES: Triglycerides: 74 mg/dL (ref <150)

## 2020-11-11 LAB — PHOSPHORUS: Phosphorus: 1.7 mg/dL — ABNORMAL LOW (ref 2.5–4.6)

## 2020-11-11 MED ORDER — ACETAMINOPHEN 10 MG/ML IV SOLN
1000.0000 mg | Freq: Four times a day (QID) | INTRAVENOUS | Status: AC
  Administered 2020-11-11 – 2020-11-12 (×4): 1000 mg via INTRAVENOUS
  Filled 2020-11-11 (×4): qty 100

## 2020-11-11 MED ORDER — POTASSIUM CHLORIDE 10 MEQ/50ML IV SOLN
10.0000 meq | INTRAVENOUS | Status: AC
  Administered 2020-11-11: 10 meq via INTRAVENOUS
  Filled 2020-11-11: qty 50

## 2020-11-11 MED ORDER — TRAVASOL 10 % IV SOLN
INTRAVENOUS | Status: AC
  Filled 2020-11-11: qty 1101.6

## 2020-11-11 MED ORDER — POTASSIUM PHOSPHATES 15 MMOLE/5ML IV SOLN
20.0000 mmol | Freq: Once | INTRAVENOUS | Status: AC
  Administered 2020-11-11: 20 mmol via INTRAVENOUS
  Filled 2020-11-11: qty 6.67

## 2020-11-11 MED ORDER — POTASSIUM CHLORIDE 10 MEQ/50ML IV SOLN
10.0000 meq | INTRAVENOUS | Status: AC
  Administered 2020-11-11 (×3): 10 meq via INTRAVENOUS
  Filled 2020-11-11 (×3): qty 50

## 2020-11-11 NOTE — Progress Notes (Signed)
Orthopedics Progress Note  Subjective: Patient reports minimal shoulder pain  Objective:  Vitals:   11/11/20 1000 11/11/20 1119  BP:  (!) 169/66  Pulse: 93 94  Resp:  20  Temp:  98.6 F (37 C)  SpO2: 98% 96%    General: Awake and alert  Musculoskeletal: Right shoulder immobilized in a shoulder sling. Minimal swelling. ROM not assessed due to acute fracture. Distally Neurovascularly intact  Lab Results  Component Value Date   WBC 6.9 11/11/2020   HGB 8.3 (L) 11/11/2020   HCT 26.2 (L) 11/11/2020   MCV 97.8 11/11/2020   PLT 233 11/11/2020       Component Value Date/Time   NA 136 11/11/2020 0600   K 3.0 (L) 11/11/2020 0600   CL 107 11/11/2020 0600   CO2 24 11/11/2020 0600   GLUCOSE 122 (H) 11/11/2020 0600   BUN 9 11/11/2020 0600   CREATININE 0.56 11/11/2020 0600   CALCIUM 7.9 (L) 11/11/2020 0600   GFRNONAA >60 11/11/2020 0600   GFRAA >90 01/15/2012 0344    Lab Results  Component Value Date   INR 1.3 (H) 11/08/2020   INR 1.02 01/08/2012   INR 1.02 07/24/2011   XRAY: AP and scapular Y view of the right shoulder shows a displaced metaphyseal fracture with 25% anterior and medial translation of the distal fragment. Overall alignment looks good. Glenohumeral alignment looks good on both views.   Assessment/Plan: Right proximal humerus fracture, stable. XRAYs show no additional displacement. Her fracture alignment is acceptable. No therapy or ROM for the shoulder.  Maintain shoulder sling at all times. Keep arm across the waist by propping under the elbow.  Follow up with me in the office in two weeks. If still in the hospital I am happy to get XRAYs here and see her again.   Doran Heater. Veverly Fells, MD 11/11/2020 2:30 PM

## 2020-11-11 NOTE — Care Management Important Message (Signed)
Important Message  Patient Details  Name: Chloe Young MRN: [id number] Date of Birth: Feb 23, 1951   Medicare Important Message Given:  Yes     Orbie Pyo 11/11/2020, 3:29 PM

## 2020-11-11 NOTE — Progress Notes (Signed)
Physical Therapy Treatment Patient Details Name: Chloe Young MRN: [id number] DOB: 02-09-1951 Today's Date: 11/11/2020    History of Present Illness Pt adm 11/15 with sepsis due to acute bowel perforation. Had 2 recent falls at home and has rt humerus fracture due to fall. 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     PT Comments    The pt was able to make good progress with mobility and PT goals at this time. She was able to progress to completing multiple short bouts of walking in her room with use of IV pole for UE support and minA for stability with forwards and lateral walking. The pt had no LOB or drop in SpO2 at this time, but remained limited by lines and fatigue with continued activity. The pt will continue to benefit from skilled PT to progress functional LE strength and power for transfers as well as general endurance for improved gait and activity tolerance.    Follow Up Recommendations  SNF     Equipment Recommendations  None recommended by PT    Recommendations for Other Services       Precautions / Restrictions Precautions Precautions: Fall;Shoulder Shoulder Interventions: Shoulder sling/immobilizer Required Braces or Orthoses: Sling Restrictions Weight Bearing Restrictions: No    Mobility  Bed Mobility Overal bed mobility: Needs Assistance Bed Mobility: Sit to Supine       Sit to supine: Min assist   General bed mobility comments: minA to bring LE into bed, min/modA to reposition  Transfers Overall transfer level: Needs assistance Equipment used: 1 person hand held assist Transfers: Sit to/from [facility] Sit to Stand: Min assist Stand pivot transfers: Min assist       General transfer comment: minA initially to shift wt forward and steady, pt able to rise to standing with use of chair armrests and minA to steady only through  session  Ambulation/Gait Ambulation/Gait assistance: Min assist Gait Distance (Feet): 15 Feet (x4) Assistive device: IV Pole Gait Pattern/deviations: Step-through pattern;Decreased stride length Gait velocity: decreased   General Gait Details: pt ambulated 15 ft forward followed by lateral walking to R x 2 and 15 ft forward followed by lateral walking to L x2. single HHA at all times for stability with assist to move IV pole      Balance Overall balance assessment: Needs assistance Sitting-balance support: Single extremity supported;Feet supported Sitting balance-Leahy Scale: Poor Sitting balance - Comments: UE support sitting EOB   Standing balance support: Single extremity supported Standing balance-Leahy Scale: Poor Standing balance comment: UE support for static standing                            Cognition Arousal/Alertness: Lethargic;Awake/alert (initially lethargic, but alert with movement) Behavior During Therapy: WFL for tasks assessed/performed Overall Cognitive Status: Within Functional Limits for tasks assessed                                        Exercises      General Comments General comments (skin integrity, edema, etc.): VSS on RA      Pertinent Vitals/Pain Pain Assessment: Faces Faces Pain Scale: Hurts little more Pain Location: abdomen Pain Descriptors / Indicators: Sore;Guarding Pain Intervention(s): Limited activity within patient's tolerance;Monitored during session;Repositioned  PT Goals (current goals can now be found in the care plan section) Acute Rehab PT Goals Patient Stated Goal: return home PT Goal Formulation: With patient Time For Goal Achievement: 11/23/20 Potential to Achieve Goals: Good Progress towards PT goals: Progressing toward goals    Frequency    Min 3X/week      PT Plan Current plan remains appropriate       AM-PAC PT "6 Clicks" Mobility   Outcome Measure  Help  needed turning from your back to your side while in a flat bed without using bedrails?: A Little Help needed moving from lying on your back to sitting on the side of a flat bed without using bedrails?: A Little Help needed moving to and from a bed to a chair (including a wheelchair)?: A Little Help needed standing up from a chair using your arms (e.g., wheelchair or bedside chair)?: A Little Help needed to walk in hospital room?: A Little Help needed climbing 3-5 steps with a railing? : A Lot 6 Click Score: 17    End of Session Equipment Utilized During Treatment: Gait belt Activity Tolerance: Patient tolerated treatment well Patient left: in bed;with call bell/phone within reach;with bed alarm set Nurse Communication: Mobility status PT Visit Diagnosis: Other abnormalities of gait and mobility (R26.89);Muscle weakness (generalized) (M62.81);History of falling (Z91.81)     Time: 3818-4037 PT Time Calculation (min) (ACUTE ONLY): 32 min  Charges:  $Gait Training: 23-37 mins                     Karma Ganja, PT, DPT   Acute Rehabilitation Department Pager #: [phone]   Otho Bellows 11/11/2020, 5:29 PM

## 2020-11-11 NOTE — Progress Notes (Signed)
Progress Note  3 Days Post-Op  Subjective: Patient reports continued incisional pain. No bowel function yet. Son at bedside.   Objective: Vital signs in last 24 hours: Temp:  [97.9 F (36.6 C)-98.6 F (37 C)] 98.2 F (36.8 C) (11/18 0751) Pulse Rate:  [83-98] 87 (11/18 0751) Resp:  [11-23] 20 (11/18 0751) BP: (130-191)/(49-79) 191/75 (11/18 0751) SpO2:  [93 %-97 %] 97 % (11/18 0751) Last BM Date:  (PTA)  Intake/Output from previous day: 11/17 0701 - 11/18 0700 In: 292.3 [I.V.:2.3; IV Piggyback:290] Out: 50 [Stool:50] Intake/Output this shift: No intake/output data recorded.  PE: General: pleasant, WD,WNfemale Heart:RRR in the 80s. No obvious murmurs, gallops, or rubs noted. Palpable radial and pedal pulses bilaterally Lungs: CTAB, no wheezes, rhonchi, or rales noted. Respiratory effort nonlabored Abd: soft,appropriately ttp, not distended, NGT with bilious drainage, midline wound clean, stoma edematous as expected with just SS output MS:RUE in shoulder sling   Lab Results:  Recent Labs    11/10/20 0142 11/11/20 0600  WBC 4.7 6.9  HGB 8.8* 8.3*  HCT 26.8* 26.2*  PLT 175 233   BMET Recent Labs    11/09/20 0805 11/10/20 0142  NA 137 131*  K 2.8* 5.8*  CL 109 107  CO2 21* 18*  GLUCOSE 122* 567*  BUN 9 9  CREATININE 0.64 0.67  CALCIUM 7.4* 6.8*   PT/INR Recent Labs    11/08/20 1236  LABPROT 15.3*  INR 1.3*   CMP     Component Value Date/Time   NA 131 (L) 11/10/2020 0142   K 5.8 (H) 11/10/2020 0142   CL 107 11/10/2020 0142   CO2 18 (L) 11/10/2020 0142   GLUCOSE 567 (HH) 11/10/2020 0142   BUN 9 11/10/2020 0142   CREATININE 0.67 11/10/2020 0142   CALCIUM 6.8 (L) 11/10/2020 0142   PROT 5.0 (L) 11/10/2020 0142   ALBUMIN 2.4 (L) 11/10/2020 0142   AST 21 11/10/2020 0142   ALT 20 11/10/2020 0142   ALKPHOS 29 (L) 11/10/2020 0142   BILITOT 0.6 11/10/2020 0142   GFRNONAA >60 11/10/2020 0142   GFRAA >90 01/15/2012 0344   Lipase      Component Value Date/Time   LIPASE 25 11/08/2020 1236       Studies/Results: DG Shoulder Right Port  Result Date: 11/10/2020 CLINICAL DATA:  Fracture EXAM: PORTABLE RIGHT SHOULDER COMPARISON:  November 08, 2020 chest radiograph including right shoulder on frontal view FINDINGS: Frontal and oblique views were obtained. There is a comminuted fracture of the proximal humeral metaphysis with medial displacement and lateral angulation of the distal major fragment with respect to the major proximal fragment. There are fractures of the greater tuberosity with mild avulsion in this area. No gross dislocation. No appreciable joint space narrowing or erosion. IMPRESSION: Comminuted fracture proximal right humerus with involvement of the proximal humeral metaphysis and greater tuberosity. At the proximal metaphysis level, there is medial displacement and lateral angulation distally. Mild avulsion of the greater tuberosity. No gross dislocation. No appreciable joint space narrowing. Electronically Signed   By: Lowella Grip III M.D.   On: 11/10/2020 08:41   [place] EKG SITE RITE  Result Date: 11/10/2020 If Site Rite image not attached, placement could not be confirmed due to current cardiac rhythm.   Anti-infectives: Anti-infectives (From admission, onward)   Start     Dose/Rate Route Frequency Ordered Stop   11/09/20 2330  piperacillin-tazobactam (ZOSYN) IVPB 3.375 g       "Followed by" Linked Group Details  3.375 g 12.5 mL/hr over 240 Minutes Intravenous Every 8 hours 11/08/20 1704     11/08/20 2200  ceFEPIme (MAXIPIME) 2 g in sodium chloride 0.9 % 100 mL IVPB  Status:  Discontinued        2 g 200 mL/hr over 30 Minutes Intravenous Every 12 hours 11/08/20 1335 11/08/20 1638   11/08/20 1830  piperacillin-tazobactam (ZOSYN) IVPB 3.375 g        3.375 g 12.5 mL/hr over 240 Minutes Intravenous To Surgery 11/08/20 1823 11/09/20 1830   11/08/20 1715  piperacillin-tazobactam (ZOSYN) IVPB 3.375 g        "Followed by" Linked Group Details   3.375 g 100 mL/hr over 30 Minutes Intravenous  Once 11/08/20 1704     11/08/20 1245  ceFEPIme (MAXIPIME) 2 g in sodium chloride 0.9 % 100 mL IVPB        2 g 200 mL/hr over 30 Minutes Intravenous  Once 11/08/20 1243 11/08/20 1338       Assessment/Plan HTN HLD Chronic pain Recent hx of R humerus fracture - She is being followed by Dr. Veverly Fells as an outpatient, he is aware she is admitted  Perforated sigmoid diverticulitis  S/p Hartmann's procedure 11/08/20 Dr. Barry Dienes - POD#3 - stoma edematous but as expected this early post-op - WOC following - NPO and NGT to LIWS for post-op ileus - await return in bowel function  - continue TPN - mobilize as able - BID dressing changes today - will place VAC tomorrow    FEN: NPO, IVF, NGT to LIWS, will start TNA/PICC VTE: SCDs, lovenox  ID: cefepime 11/15; IV Zosyn 11/15>> (stop 11/21)  LOS: 3 days    Norm Parcel , [facility] Surgery 11/11/2020, 9:20 AM Please see Amion for pager number during day hours 7:00am-4:30pm

## 2020-11-11 NOTE — Progress Notes (Signed)
Patient up to the [facility] and then the chair. Pain is well controled today. Patient is alert, pleasant and very talkative. Son Randall Hiss and husband in to visit today. Randall Hiss voiced a need to speak with the Education officer, museum. I informed Caren Griffins that the Son would like to speak with her regarding medical information. Randall Hiss (son) voiced to me that his fathers cognitive abilities are declining. Randall Hiss would like all medical information to go to him first.

## 2020-11-11 NOTE — Progress Notes (Signed)
PROGRESS NOTE    Chloe Young  TKW:[id number] DOB: April 07, 1951 DOA: 11/08/2020 PCP: Binnie Rail, MD    Brief Narrative:  [name] was admitted to the hospital working diagnosis of septic shock due to acute bowel perforation and peritonitis.  69 year old female with past medical history for hypertension, dyslipidemia, chronic pain syndrome, osteopenia, status post surgical laminectomy who presented with abdominal pain.  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  She reported abdominal pain, with nausea and vomiting.  EMS performed EKG which was suggestive of STEMI.  On her initial physical examination her temperature was 33.1, heart rate 130s, she was tachypneic and hypotensive.  Her lungs were clear to auscultation bilaterally, heart S1/S2, present, tachycardic, the abdomen was distended, diffusely tender to palpation.  CT of the abdomen with acute bowel perforation.  Suspected small bowel.  Patient was taken to the OR for sigmoid colectomy with end colostomy.  She developed destructive shock with persistent postoperative hypotension.  Patient required vasopressors and close monitoring in the ICU.  Supportive medical therapy with antibiotics and analgesics.  Transferred to [facility] 11/10/2020  Patient continue to have persistent post-operative ileus. NG tube in place and tolerating well TPN.   Assessment & Plan:   Principal Problem:   Peritonitis ([place]) Active Problems:   Hyperlipidemia   Essential hypertension   GERD (gastroesophageal reflux disease)   Severe sepsis (HCC)   Perforated abdominal viscus    1. Septic shock due to peritonitis, due to perforated  bowel. Sp sigmoid colectomy and colostomy. Continue with persistent ileus.  NG tube output over last 24 hr not documented. Wbc is 6,9.  On supportive medical care, IV analgesic, antiacids and antiemetics. Tolerating well nutrition per TPN.  Continue with antibiotic therapy with IV Zosyn #4.   Encourage mobility, out of bed to chair tid with meals, PT and OT   2. Acute right humeral fracture. Pain is well controlled with immbolization of the right upper extremity.   3. Alcohol use. 1 to 2 drinks per night, no signs of withdrawal.  Neuro checks per unit protocol.   4. Hypertension. Stable blood pressure off antihypertensive medications.    Patient continue to be at high risk for worsening ileus.   Status is: Inpatient  Remains inpatient appropriate because:IV treatments appropriate due to intensity of illness or inability to take PO   Dispo: The patient is from: Home              Anticipated d/c is to: Home              Anticipated d/c date is: > 3 days              Patient currently is not medically stable to d/c.   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  No nausea or vomiting, positive occasional flatus but not bowel movement.   Objective: Vitals:   11/11/20 0337 11/11/20 0751 11/11/20 1000 11/11/20 1119  BP: (!) 154/64 (!) 191/75  (!) 169/66  Pulse: 90 87 93 94  Resp: 11 20  20   Temp: 98 F (36.7 C) 98.2 F (36.8 C)  98.6 F (37 C)  TempSrc: Oral Oral  Oral  SpO2: 94% 97% 98% 96%  Weight:      Height:        Intake/Output Summary (Last 24 hours) at 11/11/2020 1349 Last data filed at 11/11/2020 1215 Gross per 24 hour  Intake 100 ml  Output 375 ml  Net -275 ml   Filed Weights   11/08/20 1304 11/08/20 2209  Weight: 59.9 kg 67.4 kg    Examination:   General: Not in pain  or dyspnea. Deconditioned  Neurology: Awake and alert, non focal  E ENT: no pallor, no icterus, oral mucosa moist Cardiovascular: No JVD. S1-S2 present, rhythmic, no gallops, rubs, or murmurs. No lower extremity edema. Pulmonary: vesicular breath sounds bilaterally, adequate air movement, no wheezing, rhonchi or rales. Gastrointestinal. Abdomen distended, non tender to superficial palpation, surgical wound with dressing in place. Ostomy bag in place, positive local bowel edema,.  Skin. No rashes Musculoskeletal: no joint deformities     Data Reviewed: I have personally reviewed following labs and imaging studies  CBC: Recent Labs  Lab 11/08/20 1236 11/08/20 1236 11/08/20 1246 11/09/20 0805 11/09/20 1149 11/10/20 0142 11/11/20 0600  WBC 9.0  --   --  3.5* 4.2 4.7 6.9  NEUTROABS 7.0  --   --   --   --   --  5.7  HGB 13.2   < > 13.3 8.4* 8.2* 8.8* 8.3*  HCT 40.9   < > 39.0 25.3* 25.3* 26.8* 26.2*  MCV 97.8  --   --  94.4 94.4 97.5 97.8  PLT 273  --   --  163 160 175 233   < > = values in this interval not displayed.   Basic Metabolic Panel: Recent Labs  Lab 11/08/20 1236 11/08/20 1246 11/09/20 0805 11/10/20 0142 11/11/20 0600  NA 135 136 137 131* 136  K 3.6 3.5 2.8* 5.8* 3.0*  CL 102 102 109 107 107  CO2 18*  --  21* 18* 24  GLUCOSE 157* 155* 122* 567* 122*  BUN 14 16 9 9 9   CREATININE 1.01* 0.90 0.64 0.67 0.56  CALCIUM 8.6*  --  7.4* 6.8* 7.9*  MG  --   --  1.6* 2.2 2.2  PHOS  --   --  1.7* 1.5* 1.7*   GFR: Estimated Creatinine Clearance: 60.3 mL/min (by C-G formula based on SCr of 0.56 mg/dL). Liver Function Tests: Recent Labs  Lab 11/08/20 1236 11/10/20 0142 11/11/20 0600  AST 54* 21 21  ALT 34 20 24  ALKPHOS 40 29* 39  BILITOT 1.5* 0.6 0.3  PROT 7.3 5.0* 4.9*  ALBUMIN 3.5 2.4* 2.2*   Recent Labs  Lab 11/08/20 1236  LIPASE 25   No results for input(s): AMMONIA in the last 168 hours. Coagulation Profile: Recent Labs  Lab 11/08/20 1236  INR  1.3*   Cardiac Enzymes: No results for input(s): CKTOTAL, CKMB, CKMBINDEX, TROPONINI in the last 168 hours. BNP (last 3 results) No results for input(s): PROBNP in the last 8760 hours. HbA1C: Recent Labs    11/09/20 0805  HGBA1C 5.3   CBG: Recent Labs  Lab 11/10/20 1940 11/11/20 0031 11/11/20 0315 11/11/20 0734 11/11/20 1116  GLUCAP 110* 117* 105* 118* 122*   Lipid Profile: Recent Labs    11/11/20 0600  TRIG 74   Thyroid Function Tests: No results for input(s): TSH, T4TOTAL, FREET4, T3FREE, THYROIDAB  in the last 72 hours. Anemia Panel: No results for input(s): VITAMINB12, FOLATE, FERRITIN, TIBC, IRON, RETICCTPCT in the last 72 hours.    Radiology Studies: I have reviewed all of the imaging during this hospital visit personally     Scheduled Meds: . Chlorhexidine Gluconate Cloth  6 each Topical Daily  . enoxaparin (LOVENOX) injection  40 mg Subcutaneous Daily  . insulin aspart  0-9 Units Subcutaneous Q4H  . pantoprazole (PROTONIX) IV  40 mg Intravenous QHS  . sodium chloride flush  10-40 mL Intracatheter Q12H   Continuous Infusions: . sodium chloride 60 mL/hr at 11/10/20 2024  . acetaminophen 1,000 mg (11/11/20 1347)  . methocarbamol (ROBAXIN) IV 1,000 mg (11/11/20 0618)  . piperacillin-tazobactam     Followed by  . piperacillin-tazobactam (ZOSYN)  IV 3.375 g (11/11/20 0903)  . potassium chloride    . potassium PHOSPHATE IVPB (in mmol) 20 mmol (11/11/20 1345)  . TPN ADULT (ION) 40 mL/hr at 11/10/20 1838  . TPN ADULT (ION)       LOS: 3 days        Bettylou Frew Gerome Apley, MD

## 2020-11-11 NOTE — Progress Notes (Signed)
OT Cancellation Note  Patient Details Name: Chloe Young MRN: [id number] DOB: 04/10/1951   Cancelled Treatment:    Reason Eval/Treat Not Completed: Fatigue/lethargy limiting ability to participate.  Pt just back to bed.  Nilsa Nutting., OTR/L Acute Rehabilitation Services Pager [phone] Office [phone]   Lucille Passy M 11/11/2020, 4:50 PM

## 2020-11-11 NOTE — Progress Notes (Signed)
Physical assessment completed, see flowsheet. Stoma is edematous, pink, moist, and protruding into ostomy bag. Previous ICU nurse asked about this assessment finding, ICU nurse states this is pts baseline and that MDs are aware of this. No s/sx of distress evident at this time.

## 2020-11-11 NOTE — Progress Notes (Signed)
PHARMACY - TOTAL PARENTERAL NUTRITION CONSULT NOTE   Indication: Prolonged ileus expected  Patient Measurements: Height: 5\' 3"  (160 cm) Weight: 67.4 kg (148 lb 9.4 oz) IBW/kg (Calculated) : 52.4   Body mass index is 26.32 kg/m.  Assessment: 69 year old female with perforated sigmoid diverticulitis who underwent Hartmann's procedure on 11/08/20. Patient is now POD#2 and is NPO with NGT to low intermittent suction. Drainage is bilious and 481mL over last 24 hours. Pre-albumin is significantly low at <5 and Surgery expects ileus. Pharmacy consulted to start TPN.   Patient reports alcohol use at home (~14 standard drinks of alcohol per week). Patient reports that she was eating well up until the pain started. Patient is in a good amount of pain this morning - Surgery in room as well and addressing this issue.   Glucose / Insulin: CBGs 84-110 .  Electrolytes: K 3, Mag 2.2, phos 1.7. Na low at 136. CO2 24.  Renal: SCr 0.67 -stable. UOP 880+ mls. LFTs / TGs: LFTs are within normal limits. Last TG in March was 56.  Prealbumin / albumin: Pre-albumin <5 Intake / Output; MIVF: 0 mL of emesis from NGT. Net +5.4L.. NS at 60 mL/hr GI Imaging: None since start of TPN Surgeries / Procedures: None since start of TPN  Central access: PICC ordered to be placed 11/10/20 TPN start date: 11/10/20  Nutritional Goals (per RD recommendation on pending): Goal TPN rate is 85 mL/hr   Current Nutrition:  NPO except sips with medications  Plan:  Increase TPN to 73mL/hr at 1800 which is goal rate Electrolytes in TPN: 37mEq/L of Na, 35 mEq/L of K, 27mEq/L of Ca, 67mEq/L of Mg, and 31mmol/L of Phos. BD:ZHGDJME 1:1.   Discontinue oral MVI due to pain and questionable absorption. Add standard MVI and trace elements to TPN Discontinue oral Thiamine and Folic Acid due to pain and questionable absorption.  Add Thiamine and Folic Acid to TPN.  Continue Sensitive q4h SSI and adjust as needed  D/C MIVF at 1800 KCL  10 meq x 4 K Phos 20 mmol x 1 Monitor TPN labs on Mon/Thurs, Daily for at least 3 days   Alanda Slim, PharmD, [facility] Clinical Pharmacist Please see AMION for all Pharmacists' Contact Phone Numbers 11/11/2020, 7:27 AM

## 2020-11-12 DIAGNOSIS — R198 Other specified symptoms and signs involving the digestive system and abdomen: Secondary | ICD-10-CM | POA: Diagnosis not present

## 2020-11-12 DIAGNOSIS — K659 Peritonitis, unspecified: Secondary | ICD-10-CM | POA: Diagnosis not present

## 2020-11-12 DIAGNOSIS — K219 Gastro-esophageal reflux disease without esophagitis: Secondary | ICD-10-CM | POA: Diagnosis not present

## 2020-11-12 DIAGNOSIS — I10 Essential (primary) hypertension: Secondary | ICD-10-CM | POA: Diagnosis not present

## 2020-11-12 LAB — COMPREHENSIVE METABOLIC PANEL
ALT: 20 U/L (ref 0–44)
AST: 19 U/L (ref 15–41)
Albumin: 2 g/dL — ABNORMAL LOW (ref 3.5–5.0)
Alkaline Phosphatase: 49 U/L (ref 38–126)
Anion gap: 5 (ref 5–15)
BUN: 8 mg/dL (ref 8–23)
CO2: 23 mmol/L (ref 22–32)
Calcium: 7.9 mg/dL — ABNORMAL LOW (ref 8.9–10.3)
Chloride: 111 mmol/L (ref 98–111)
Creatinine, Ser: 0.5 mg/dL (ref 0.44–1.00)
GFR, Estimated: >60 mL/min (ref >60)
Glucose, Bld: 139 mg/dL — ABNORMAL HIGH (ref 70–99)
Potassium: 3.2 mmol/L — ABNORMAL LOW (ref 3.5–5.1)
Sodium: 139 mmol/L (ref 135–145)
Total Bilirubin: 0.7 mg/dL (ref 0.3–1.2)
Total Protein: 4.9 g/dL — ABNORMAL LOW (ref 6.5–8.1)

## 2020-11-12 LAB — GLUCOSE, CAPILLARY
Glucose-Capillary: 138 mg/dL — ABNORMAL HIGH (ref 70–99)
Glucose-Capillary: 147 mg/dL — ABNORMAL HIGH (ref 70–99)
Glucose-Capillary: 124 mg/dL — ABNORMAL HIGH (ref 70–99)
Glucose-Capillary: 139 mg/dL — ABNORMAL HIGH (ref 70–99)

## 2020-11-12 LAB — MAGNESIUM: Magnesium: 2 mg/dL (ref 1.7–2.4)

## 2020-11-12 LAB — PHOSPHORUS: Phosphorus: 2.5 mg/dL (ref 2.5–4.6)

## 2020-11-12 MED ORDER — METHOCARBAMOL 500 MG PO TABS
500.0000 mg | ORAL_TABLET | Freq: Three times a day (TID) | ORAL | Status: DC
Start: 2020-11-12 — End: 2020-11-12

## 2020-11-12 MED ORDER — ACETAMINOPHEN 325 MG PO TABS
650.0000 mg | ORAL_TABLET | Freq: Four times a day (QID) | ORAL | Status: DC | PRN
  Administered 2020-11-12 – 2020-11-13 (×3): 650 mg via ORAL
  Filled 2020-11-12 (×3): qty 2

## 2020-11-12 MED ORDER — OXYCODONE HCL 5 MG PO TABS
5.0000 mg | ORAL_TABLET | ORAL | Status: DC | PRN
  Administered 2020-11-12 – 2020-11-13 (×3): 5 mg via ORAL
  Administered 2020-11-13: 10 mg via ORAL
  Administered 2020-11-13 – 2020-11-16 (×4): 5 mg via ORAL
  Administered 2020-11-16 – 2020-11-19 (×7): 10 mg via ORAL
  Administered 2020-11-20: 5 mg via ORAL
  Administered 2020-11-20: 10 mg via ORAL
  Administered 2020-11-20 – 2020-11-21 (×4): 5 mg via ORAL
  Administered 2020-11-21 (×2): 10 mg via ORAL
  Administered 2020-11-21 – 2020-11-22 (×2): 5 mg via ORAL
  Administered 2020-11-22 (×2): 10 mg via ORAL
  Administered 2020-11-22: 5 mg via ORAL
  Filled 2020-11-12 (×2): qty 2
  Filled 2020-11-12 (×2): qty 1
  Filled 2020-11-12 (×2): qty 2
  Filled 2020-11-12 (×3): qty 1
  Filled 2020-11-12: qty 2
  Filled 2020-11-12: qty 1
  Filled 2020-11-12 (×3): qty 2
  Filled 2020-11-12: qty 1
  Filled 2020-11-12: qty 2
  Filled 2020-11-12 (×2): qty 1
  Filled 2020-11-12: qty 2
  Filled 2020-11-12 (×2): qty 1
  Filled 2020-11-12 (×2): qty 2
  Filled 2020-11-12 (×4): qty 1
  Filled 2020-11-12 (×2): qty 2
  Filled 2020-11-12: qty 1
  Filled 2020-11-12: qty 2

## 2020-11-12 MED ORDER — POTASSIUM CHLORIDE 10 MEQ/50ML IV SOLN
10.0000 meq | INTRAVENOUS | Status: AC
  Administered 2020-11-12 (×4): 10 meq via INTRAVENOUS
  Filled 2020-11-12 (×4): qty 50

## 2020-11-12 MED ORDER — METHOCARBAMOL 500 MG PO TABS
1000.0000 mg | ORAL_TABLET | Freq: Three times a day (TID) | ORAL | Status: DC
  Administered 2020-11-12 – 2020-11-22 (×33): 1000 mg via ORAL
  Filled 2020-11-12 (×33): qty 2

## 2020-11-12 MED ORDER — TRAVASOL 10 % IV SOLN
INTRAVENOUS | Status: AC
  Filled 2020-11-12: qty 1044

## 2020-11-12 MED ORDER — INSULIN ASPART 100 UNIT/ML ~~LOC~~ SOLN
0.0000 [IU] | Freq: Four times a day (QID) | SUBCUTANEOUS | Status: DC
  Administered 2020-11-12 – 2020-11-13 (×3): 1 [IU] via SUBCUTANEOUS

## 2020-11-12 NOTE — TOC Initial Note (Signed)
Transition of Care [facility]) - Initial/Assessment Note    Patient Details  Name: Chloe Young MRN: [id number] Date of Birth: 22-May-1951  Transition of Care [facility]) CM/SW Contact:    Vinie Sill, [place] Phone Number: 11/12/2020, 8:41 AM  Clinical Narrative:                  CSW spoke with patient's son,Eric. CSW introduced self and explained role. Randall Hiss states patient and her spouse lives in the home alone. The patient' spouse is unable to provide level of care. CSW discussed PT recommendation of short term rehab at [facility]. Family is agreeable to SNF. CSW explained the SNF process.   CSW will continue to follow and assist with discharge planning.   Thurmond Butts, MSW, [place] Clinical Social Worker   Expected Discharge Plan: Skilled Nursing Facility Barriers to Discharge: Continued Medical Work up   Patient Goals and CMS Choice        Expected Discharge Plan and Services Expected Discharge Plan: [place] In-house Referral: Clinical Social Work                                            Prior Living Arrangements/Services   Lives with:: Self, Spouse Patient language and need for interpreter reviewed:: No Do you feel safe going back to the place where you live?: Yes      Need for Family Participation in Patient Care: Yes (Comment) Care giver support system in place?: Yes (comment)   Criminal Activity/Legal Involvement Pertinent to Current Situation/Hospitalization: No - Comment as needed  Activities of Daily Living      Permission Sought/Granted                  Emotional Assessment Appearance:: Appears stated age Attitude/Demeanor/Rapport: Unable to Assess Affect (typically observed): Unable to Assess Orientation: : Oriented to Self, Oriented to Place, Oriented to  Time, Oriented to Situation Alcohol / Substance Use: Not Applicable Psych Involvement: No (comment)  Admission diagnosis:  Perforated abdominal viscus [R19.8] Sepsis  ([place]) [A41.9] Partial intestinal obstruction, unspecified cause ([place]) [K56.600] Sepsis, due to unspecified organism, unspecified whether acute organ dysfunction present [facility]) [A41.9] Patient Active Problem List   Diagnosis Date Noted  . Peritonitis ([place]) 11/10/2020  . Severe sepsis ([place]) 11/08/2020  . Perforated abdominal viscus   . Chronic nasal congestion 03/10/2020  . Lumbar radiculopathy 06/15/2016  . Cervicogenic headache 06/15/2016  . Osteopenia 06/15/2016  . GERD (gastroesophageal reflux disease) 06/15/2016  . Fasting hyperglycemia 11/25/2013  . Nonspecific abnormal electrocardiogram (ECG) (EKG) 05/28/2012  . Cervical post-laminectomy syndrome 03/18/2012  . Osteoarthritis of both knees 03/18/2012  . RAYNAUD'S SYNDROME 10/11/2010  . NECK PAIN, CHRONIC 04/19/2010  . Hyperlipidemia 04/19/2009  . Essential hypertension 04/19/2009   PCP:  Binnie Rail, MD Pharmacy:   [place], [place] [address] Phone: [phone] Fax: [place], [place], [address] [place], [place] [address] Phone: [phone] Fax: [phone]     Social Determinants of Health (SDOH) Interventions    Readmission Risk Interventions No flowsheet data found.

## 2020-11-12 NOTE — Progress Notes (Signed)
Pt HR 130s MD at bedside pt temp 102.8. pt states she feels hot but denies any pain or discomfort. Tylenol administered orders to manage temp and call back MD if HR is not better. Ice packs on and covers off.

## 2020-11-12 NOTE — Progress Notes (Addendum)
PROGRESS NOTE    AGUSTA HACKENBERG  NFA:[id number] DOB: 07-07-1951 DOA: 11/08/2020 PCP: Binnie Rail, MD    Brief Narrative:  Mrs. Ildefonso was admitted to the hospital working diagnosis of septic shock due to acute bowel perforation and peritonitis.  69 year old female with past medical history for hypertension, dyslipidemia, chronic pain syndrome, osteopenia, status post surgical laminectomy who presented with abdominal pain. 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 She reported abdominal pain, with nausea and vomiting. EMS performed EKG which was suggestive of STEMI. On her initial physical examination her temperature was 33.1, heart rate 130s, she was tachypneic and hypotensive. Her lungs were clear to auscultation bilaterally, heart S1/S2, present, tachycardic, the abdomen was distended, diffusely tender to palpation.  CT of the abdomen with acute bowel perforation. Suspected small bowel.  Patient was taken to the OR for sigmoid colectomy with end colostomy. She developed destructive shock with persistent postoperative hypotension.  Patient required vasopressors and close monitoring in the ICU. Supportive medical therapy with antibiotics and analgesics.  Transferred to [facility] 11/10/2020  Patient continue to have persistent post-operative ileus. NG tube in place and tolerating well TPN.    Assessment & Plan:   Principal Problem:   Peritonitis ([place]) Active Problems:   Hyperlipidemia   Essential hypertension   GERD (gastroesophageal reflux disease)   Severe sepsis (HCC)   Perforated abdominal viscus   1. Septic shock due to peritonitis, due to perforated bowel. Sp sigmoid colectomy and colostomy. Continue with persistent ileus.  Ng tube has been removed, and now allowed to have clears. No nausea or vomiting.   Continue with supportive medical care, IV analgesic, antiacids and antiemetics. Continue with TPN for nutritional support. On insulin sq per  protocol with TPN.  Antibiotic therapy with IV Zosyn #5/7.Positive fever spike today, possible reactive to recent surgery and right upper extremity injury.   Continue to encourage mobility, out of bed to chair tid with meals, PT and OT   2. Acute right humeral fracture. Continue with immbolization of the right upper extremity. Out of bed to chair.   3. Alcohol use. 1 to 2 drinks per night, no signs of withdrawal.  No clinical signs of withdrawal.   4. Hypertension. Continue blood pressure monitoring, off antihypertensive medications.    Status is: Inpatient  Remains inpatient appropriate because:IV treatments appropriate due to intensity of illness or inability to take PO   Dispo: The patient is from: Home              Anticipated d/c is to: Home              Anticipated d/c date is: 3 days              Patient currently is not medically stable to d/c.   DVT prophylaxis: Enoxaparin   Code Status:   full  Family Communication:  I spoke with patient's son at the bedside, we talked in detail about patient's condition, plan of care and prognosis and all questions were addressed.      Nutrition Status: Nutrition Problem: Inadequate oral intake Etiology: altered GI function Signs/Symptoms: NPO status Interventions: TPN    Consultants:  Surgery  Procedures: sigmoid colectomy and end colostomy (Hartmann's procedure  Antimicrobials:  IV zosyn  Subjective: Patient with fever spike, otherwise with no nausea or vomiting and tolerating clears, no abdominal or back pain,. Left arm pain is controlled.   Objective: Vitals:   11/11/20 2341 11/12/20 0310 11/12/20 0800  11/12/20 1147  BP: 140/79 107/69 135/75 (!) 165/91  Pulse: 98 99 93 (!) 109  Resp: 15 14 20 20   Temp: 99.3 F (37.4 C) 98.7 F (37.1 C) 98.5 F (36.9 C) 99.9 F (37.7 C)  TempSrc: Oral Oral Oral Axillary  SpO2: 97% 94% 94% 95%  Weight:      Height:        Intake/Output Summary (Last 24  hours) at 11/12/2020 1408 Last data filed at 11/12/2020 0515 Gross per 24 hour  Intake 1522.51 ml  Output 1925 ml  Net -402.49 ml   Filed Weights   11/08/20 1304 11/08/20 2209  Weight: 59.9 kg 67.4 kg    Examination:   General: Not in pain or dyspnea, deconditioned  Neurology: Awake and alert, non focal  E ENT: mild pallor, no icterus, oral mucosa moist Cardiovascular: No JVD. S1-S2 present, rhythmic, no gallops, rubs, or murmurs. No lower extremity edema. Pulmonary: positive breath sounds bilaterally, with no wheezing, rhonchi or rales. Gastrointestinal. Abdomen soft and non tender/ ostomy in place, positive local edema.  Skin. No rashes Musculoskeletal: no joint deformities     Data Reviewed: I have personally reviewed following labs and imaging studies  CBC: Recent Labs  Lab 11/08/20 1236 11/08/20 1236 11/08/20 1246 11/09/20 0805 11/09/20 1149 11/10/20 0142 11/11/20 0600  WBC 9.0  --   --  3.5* 4.2 4.7 6.9  NEUTROABS 7.0  --   --   --   --   --  5.7  HGB 13.2   < > 13.3 8.4* 8.2* 8.8* 8.3*  HCT 40.9   < > 39.0 25.3* 25.3* 26.8* 26.2*  MCV 97.8  --   --  94.4 94.4 97.5 97.8  PLT 273  --   --  163 160 175 233   < > = values in this interval not displayed.   Basic Metabolic Panel: Recent Labs  Lab 11/08/20 1236 11/08/20 1236 11/08/20 1246 11/09/20 0805 11/10/20 0142 11/11/20 0600 11/12/20 0430  NA 135   < > 136 137 131* 136 139  K 3.6   < > 3.5 2.8* 5.8* 3.0* 3.2*  CL 102   < > 102 109 107 107 111  CO2 18*  --   --  21* 18* 24 23  GLUCOSE 157*   < > 155* 122* 567* 122* 139*  BUN 14   < > 16 9 9 9 8   CREATININE 1.01*   < > 0.90 0.64 0.67 0.56 0.50  CALCIUM 8.6*  --   --  7.4* 6.8* 7.9* 7.9*  MG  --   --   --  1.6* 2.2 2.2 2.0  PHOS  --   --   --  1.7* 1.5* 1.7* 2.5   < > = values in this interval not displayed.   GFR: Estimated Creatinine Clearance: 60.3 mL/min (by C-G formula based on SCr of 0.5 mg/dL). Liver Function Tests: Recent Labs  Lab  11/08/20 1236 11/10/20 0142 11/11/20 0600 11/12/20 0430  AST 54* 21 21 19   ALT 34 20 24 20   ALKPHOS 40 29* 39 49  BILITOT 1.5* 0.6 0.3 0.7  PROT 7.3 5.0* 4.9* 4.9*  ALBUMIN 3.5 2.4* 2.2* 2.0*   Recent Labs  Lab 11/08/20 1236  LIPASE 25   No results for input(s): AMMONIA in the last 168 hours. Coagulation Profile: Recent Labs  Lab 11/08/20 1236  INR 1.3*   Cardiac Enzymes: No results for input(s): CKTOTAL, CKMB, CKMBINDEX, TROPONINI in the last 168 hours.  BNP (last 3 results) No results for input(s): PROBNP in the last 8760 hours. HbA1C: No results for input(s): HGBA1C in the last 72 hours. CBG: Recent Labs  Lab 11/11/20 1956 11/11/20 2341 11/12/20 0328 11/12/20 0759 11/12/20 1145  GLUCAP 117* 132* 138* 147* 124*   Lipid Profile: Recent Labs    11/11/20 0600  TRIG 74   Thyroid Function Tests: No results for input(s): TSH, T4TOTAL, FREET4, T3FREE, THYROIDAB in the last 72 hours. Anemia Panel: No results for input(s): VITAMINB12, FOLATE, FERRITIN, TIBC, IRON, RETICCTPCT in the last 72 hours.    Radiology Studies: I have reviewed all of the imaging during this hospital visit personally     Scheduled Meds: . Chlorhexidine Gluconate Cloth  6 each Topical Daily  . enoxaparin (LOVENOX) injection  40 mg Subcutaneous Daily  . insulin aspart  0-9 Units Subcutaneous Q6H  . methocarbamol  1,000 mg Oral TID  . pantoprazole (PROTONIX) IV  40 mg Intravenous QHS  . sodium chloride flush  10-40 mL Intracatheter Q12H   Continuous Infusions: . piperacillin-tazobactam     Followed by  . piperacillin-tazobactam (ZOSYN)  IV 3.375 g (11/12/20 0850)  . TPN ADULT (ION) 85 mL/hr at 11/11/20 1737  . TPN ADULT (ION)       LOS: 4 days        Micheal Murad Gerome Apley, MD

## 2020-11-12 NOTE — Progress Notes (Signed)
Progress Note  4 Days Post-Op  Subjective: Patient reports abdominal pain after VAC change. NGT came out overnight, denies nausea other than when having pain. Up in chair this AM. Son at bedside.  Objective: Vital signs in last 24 hours: Temp:  [98.5 F (36.9 C)-99.7 F (37.6 C)] 98.5 F (36.9 C) (11/19 0800) Pulse Rate:  [93-102] 93 (11/19 0800) Resp:  [14-20] 20 (11/19 0800) BP: (107-170)/(66-83) 135/75 (11/19 0800) SpO2:  [94 %-97 %] 94 % (11/19 0800) Last BM Date: 11/11/20  Intake/Output from previous day: 11/18 0701 - 11/19 0700 In: 1522.5 [I.V.:1237.7; IV Piggyback:284.8] Out: 2300 [Urine:2200; Emesis/NG output:100] Intake/Output this shift: No intake/output data recorded.  PE: General: pleasant, WD,WNfemale Heart:sinus tachycardia in low 100s. No obvious murmurs, gallops, or rubs noted. Palpable radial and pedal pulses bilaterally Lungs: CTAB, no wheezes, rhonchi, or rales noted. Respiratory effort nonlabored Abd: soft,appropriately ttp, not distended, midline wound with VAC present, stoma viable with flatus in ostomy appliance MS:RUE in shoulder sling   Lab Results:  Recent Labs    11/10/20 0142 11/11/20 0600  WBC 4.7 6.9  HGB 8.8* 8.3*  HCT 26.8* 26.2*  PLT 175 233   BMET Recent Labs    11/11/20 0600 11/12/20 0430  NA 136 139  K 3.0* 3.2*  CL 107 111  CO2 24 23  GLUCOSE 122* 139*  BUN 9 8  CREATININE 0.56 0.50  CALCIUM 7.9* 7.9*   PT/INR No results for input(s): LABPROT, INR in the last 72 hours. CMP     Component Value Date/Time   NA 139 11/12/2020 0430   K 3.2 (L) 11/12/2020 0430   CL 111 11/12/2020 0430   CO2 23 11/12/2020 0430   GLUCOSE 139 (H) 11/12/2020 0430   BUN 8 11/12/2020 0430   CREATININE 0.50 11/12/2020 0430   CALCIUM 7.9 (L) 11/12/2020 0430   PROT 4.9 (L) 11/12/2020 0430   ALBUMIN 2.0 (L) 11/12/2020 0430   AST 19 11/12/2020 0430   ALT 20 11/12/2020 0430   ALKPHOS 49 11/12/2020 0430   BILITOT 0.7 11/12/2020  0430   GFRNONAA >60 11/12/2020 0430   GFRAA >90 01/15/2012 0344   Lipase     Component Value Date/Time   LIPASE 25 11/08/2020 1236       Studies/Results: No results found.  Anti-infectives: Anti-infectives (From admission, onward)   Start     Dose/Rate Route Frequency Ordered Stop   11/09/20 2330  piperacillin-tazobactam (ZOSYN) IVPB 3.375 g       "Followed by" Linked Group Details   3.375 g 12.5 mL/hr over 240 Minutes Intravenous Every 8 hours 11/08/20 1704 11/14/20 2329   11/08/20 2200  ceFEPIme (MAXIPIME) 2 g in sodium chloride 0.9 % 100 mL IVPB  Status:  Discontinued        2 g 200 mL/hr over 30 Minutes Intravenous Every 12 hours 11/08/20 1335 11/08/20 1638   11/08/20 1830  piperacillin-tazobactam (ZOSYN) IVPB 3.375 g        3.375 g 12.5 mL/hr over 240 Minutes Intravenous To Surgery 11/08/20 1823 11/09/20 1830   11/08/20 1715  piperacillin-tazobactam (ZOSYN) IVPB 3.375 g       "Followed by" Linked Group Details   3.375 g 100 mL/hr over 30 Minutes Intravenous  Once 11/08/20 1704     11/08/20 1245  ceFEPIme (MAXIPIME) 2 g in sodium chloride 0.9 % 100 mL IVPB        2 g 200 mL/hr over 30 Minutes Intravenous  Once 11/08/20 1243 11/08/20 1338  Assessment/Plan HTN HLD Chronic pain Recent hx of R humerus fracture - Dr. Veverly Fells recommends f/u in 2 weeks   Perforated sigmoid diverticulitis  S/p Hartmann's procedure 11/08/20 Dr. Barry Dienes - POD#4 - VAC M/W/F - NGT out and passing some flatus - start CLD today - continue TPN until tolerating FLD - mobilize as able - stop abx after POD#5   FEN: CLD, TPN VTE: SCDs, lovenox  ID: cefepime 11/15; IV Zosyn 11/15>> (stop 11/21)  Please communicate updates with patient's son Sherrey North over the weekend  LOS: 4 days    Norm Parcel , [facility] 11/12/2020, 10:01 AM Please see Amion for pager number during day hours 7:00am-4:30pm

## 2020-11-12 NOTE — Progress Notes (Signed)
Physical Therapy Treatment Patient Details Name: Chloe Young MRN: [id number] DOB: Dec 23, 1951 Today's Date: 11/12/2020    History of Present Illness Pt adm 11/15 with sepsis due to acute bowel perforation. Had 2 recent falls at home and has rt humerus fracture due to fall. 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     PT Comments    Pt in bed upon arrival of PT, improvement in resting HR from earlier in day when pt was sustaining in 130s at rest. The pt was able to tolerate multiple OOB transfers with min/modA and use of HHA or SPC. The pt benefits from encouragement and significant cues for technique to complete transfers and self care tasks at this time, but was able to complete with minA to steady. The pt was able to initiate gait training with SPC in LUE, and required only minG for safety at this time. The pt will continue to benefit from skilled PT to progress functional power and strength for sit-stand transfers, as well as endurance and stability for improved ability with gait.    Follow Up Recommendations  SNF     Equipment Recommendations  None recommended by PT    Recommendations for Other Services       Precautions / Restrictions Precautions Precautions: Fall;Shoulder Shoulder Interventions: Shoulder sling/immobilizer Precaution Comments: Per Dr. Veverly Fells' note 11/17, pt to keep sling on at all times with no ROM Rt shoulder, arm across body  Required Braces or Orthoses: Sling Restrictions Weight Bearing Restrictions: No    Mobility  Bed Mobility Overal bed mobility: Needs Assistance Bed Mobility: Supine to Sit     Supine to sit: Min assist;HOB elevated     General bed mobility comments: pt required minA to complete in addition to VC for leg mobility and use of bed rails.   Transfers Overall transfer level: Needs assistance Equipment used: Straight cane Transfers: Sit  to/from [facility] Sit to Stand: Min assist Stand pivot transfers: Min assist       General transfer comment: pt asking for complete assist to initiate stand stating "I don't know what to do, can you give me a boost" but able to stand with min/modA when cued to push from the bed to stand.   Ambulation/Gait Ambulation/Gait assistance: Min assist Gait Distance (Feet): 8 Feet Assistive device: Straight cane Gait Pattern/deviations: Step-through pattern;Decreased stride length Gait velocity: decreased Gait velocity interpretation: <1.31 ft/sec, indicative of household ambulator General Gait Details: pt able to take steps to [facility] and then forward to recliner with use of SPC to steady in LUE       Balance Overall balance assessment: Needs assistance Sitting-balance support: No upper extremity supported Sitting balance-Leahy Scale: Fair Sitting balance - Comments: No UE support    Standing balance support: Single extremity supported Standing balance-Leahy Scale: Poor Standing balance comment: requires UE support                             Cognition Arousal/Alertness: Awake/alert Behavior During Therapy: Flat affect Overall Cognitive Status: Within Functional Limits for tasks assessed                                 General Comments: for basic info       Exercises      General Comments  General comments (skin integrity, edema, etc.): HR to 127 with mobility, SpO2 steady on RA      Pertinent Vitals/Pain Pain Assessment: Faces Faces Pain Scale: Hurts little more Pain Location: abdomen Pain Descriptors / Indicators: Sore;Guarding Pain Intervention(s): Limited activity within patient's tolerance;Monitored during session;Patient requesting pain meds-RN notified           PT Goals (current goals can now be found in the care plan section) Acute Rehab PT Goals Patient Stated Goal: pt did not state  PT Goal Formulation: With  patient Time For Goal Achievement: 11/23/20 Potential to Achieve Goals: Good Progress towards PT goals: Progressing toward goals    Frequency    Min 3X/week      PT Plan Current plan remains appropriate       AM-PAC PT "6 Clicks" Mobility   Outcome Measure  Help needed turning from your back to your side while in a flat bed without using bedrails?: A Little Help needed moving from lying on your back to sitting on the side of a flat bed without using bedrails?: A Little Help needed moving to and from a bed to a chair (including a wheelchair)?: A Little Help needed standing up from a chair using your arms (e.g., wheelchair or bedside chair)?: A Little Help needed to walk in hospital room?: A Little Help needed climbing 3-5 steps with a railing? : A Lot 6 Click Score: 17    End of Session Equipment Utilized During Treatment: Gait belt Activity Tolerance: Patient tolerated treatment well Patient left: with call bell/phone within reach;in chair Nurse Communication: Mobility status;Patient requests pain meds PT Visit Diagnosis: Other abnormalities of gait and mobility (R26.89);Muscle weakness (generalized) (M62.81);History of falling (Z91.81)     Time: 1717-1747 (plus 1408 - 1418 (10 min)) PT Time Calculation (min) (ACUTE ONLY): 30 min  Charges:  $Gait Training: 23-37 mins $Therapeutic Activity: 8-22 mins                     Karma Ganja, PT, DPT   Acute Rehabilitation Department Pager #: [phone]   Otho Bellows 11/12/2020, 5:58 PM

## 2020-11-12 NOTE — Progress Notes (Addendum)
PHARMACY - TOTAL PARENTERAL NUTRITION CONSULT NOTE   Indication: Prolonged ileus expected  Patient Measurements: Height: 5\' 3"  (160 cm) Weight: 67.4 kg (148 lb 9.4 oz) IBW/kg (Calculated) : 52.4   Body mass index is 26.32 kg/m.  Assessment: 69 year old female with perforated sigmoid diverticulitis who underwent Hartmann's procedure on 11/08/20. Patient developed shock post-op and required vasopressors in ICU, transferred back to the floor on 11/10/20. Patient NPO with NGT drainage bilious. Pre-albumin significantly low at <5, and Surgery expects post-op ileus. Pharmacy consulted to start TPN.   Patient reports alcohol use at home (~14 standard drinks of alcohol per week). Patient reports eating well up until her pain started.  Glucose / Insulin: No hx DM - A1c 5.3%. CBGs controlled. Utilized 2 units SSI in last 24hrs  Electrolytes: K 3>3.2 (goal >/= 4 w/ ileus; s/p 4 K runs + KPhos 26mmol x 1 yesterday), Phos up to 2.5 (s/p KPhos 58mmol x 1 yesterday), Cl high-nml, CO2 low-nml; others stable WNL Renal: SCr 0.5 stable, BUN WNL, UOP 1.4 ml/kg/hr LFTs / TGs: LFTs / TG / Tbili WNL Prealbumin / albumin: Pre-albumin <5; albumin 2 Intake / Output; MIVF: 124ml NGT output yesterday - pulled out overnight, pt refuses replacement. LBM 11/18. Net +4.8L this admit GI Imaging: None since start of TPN Surgeries / Procedures: None since start of TPN  Central access: PICC placed 11/10/20 TPN start date: 11/10/20  Nutritional Goals (per RD recommendation on 11/10/20): Kcal: 1650-1850; Protein: 90-110g; Fluid: >/=1.7L  Goal TPN rate is 75 mL/hr (provides 104g protein, 252g CHO, and 54g SMOF lipids, for total 1814 kCal in 24hrs)   Current Nutrition:  Starting CLD 11/19 TPN  Plan:  Continue TPN at goal rate 47mL/hr at 1800 (recalculated 11/19) Electrolytes in TPN: Incr K back to standard 23mEq/L; continue - 42mEq/L of Na, 62mEq/L of Ca, 44mEq/L of Mg, and 44mmol/L of Phos. Change QD:UKRCVKF to  1:2. Add standard MVI and trace elements + thiamine and folic acid to TPN. D/c PO MVI, thiamine, and folic acid due to pain and questionable absorption.  Reduce Sensitive SSI to q6h due to low usage and adjust as needed - consider d/c 11/20 if continues to not require Give K runs x 4 Monitor TPN labs, toleration of diet/resolution of ileus and ability to wean TPN (keeping TPN until tolerating FLD per Surgery 11/19)   Arturo Morton, PharmD, BCPS Please check AMION for all [facility] [place] contact numbers Clinical Pharmacist 11/12/2020 7:47 AM

## 2020-11-12 NOTE — Evaluation (Signed)
Occupational Therapy Evaluation Patient Details Name: Chloe Young MRN: [id number] DOB: 06-12-1951 Today's Date: 11/12/2020    History of Present Illness Pt adm 11/15 with sepsis due to acute bowel perforation. Had 2 recent falls at home and has rt humerus fracture due to fall. 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    Clinical Impression   Pt admitted with above. She demonstrates the below listed deficits and will benefit from continued OT to maximize safety and independence with BADLs.  Pt presents to OT with generalized weakness, impaired balance, decreased Lt UE function, increased pain.  She requires min A - total A for ADLs and min A for functional mobility.  She lives with her spouse, who has dementia, and is unable to assist her.  She was fully independent PTA.  Recommend SNF level rehab. Will follow.       Follow Up Recommendations  SNF    Equipment Recommendations  None recommended by OT    Recommendations for Other Services       Precautions / Restrictions Precautions Precautions: Fall;Shoulder Shoulder Interventions: Shoulder sling/immobilizer Precaution Comments: Per Dr. Veverly Fells' note 11/17, pt to keep sling on at all times with no ROM Rt shoulder, arm across body  Required Braces or Orthoses: Sling      Mobility Bed Mobility               General bed mobility comments: pt sitting on BSC up on OT entrance     Transfers Overall transfer level: Needs assistance   Transfers: Sit to/from Stand;Stand Pivot Transfers Sit to Stand: Min assist Stand pivot transfers: Min assist       General transfer comment: assist to steady.  Pt is very guarded and cautious with movement     Balance Overall balance assessment: Needs assistance Sitting-balance support: No upper extremity supported Sitting balance-Leahy Scale: Fair Sitting balance - Comments: No UE support     Standing balance support: Single extremity supported Standing balance-Leahy Scale: Poor Standing balance comment: requires UE support                            ADL either performed or assessed with clinical judgement   ADL Overall ADL's : Needs assistance/impaired Eating/Feeding: Set up;Sitting   Grooming: Wash/dry hands;Wash/dry face;Oral care;Brushing hair;Minimal assistance;Standing   Upper Body Bathing: Moderate assistance;Sitting   Lower Body Bathing: Maximal assistance;Sit to/from stand   Upper Body Dressing : Maximal assistance;Sitting   Lower Body Dressing: Total assistance;Sit to/from stand Lower Body Dressing Details (indicate cue type and reason): unable to perform figure 4  Toilet Transfer: Minimal assistance;Stand-pivot;BSC   Toileting- Clothing Manipulation and Hygiene: Maximal assistance;Sit to/from stand Toileting - Clothing Manipulation Details (indicate cue type and reason): min A for anterior peri care in standing.  She is unable to access posterior peri area      Functional mobility during ADLs: Minimal assistance General ADL Comments: Pt is very guarded and tentative with movement and attempts at ADL activities - requires significant encouragement      Vision Patient Visual Report: No change from baseline       Perception     Praxis      Pertinent Vitals/Pain Pain Assessment: 0-10 Pain Score: 5  Pain Location: abdomen Pain Descriptors / Indicators: Sore;Guarding Pain Intervention(s): Monitored during session;Limited activity within patient's tolerance;Repositioned;Patient requesting pain meds-RN notified  Hand Dominance Right   Extremity/Trunk Assessment Upper Extremity Assessment Upper Extremity Assessment: RUE deficits/detail RUE Deficits / Details: Rt UE immobilized in sling.  Fingers and wrist WFL  RUE Coordination: decreased gross motor   Lower Extremity Assessment Lower Extremity Assessment: Defer to PT evaluation    Cervical / Trunk Assessment Cervical / Trunk Assessment: Normal   Communication Communication Communication: No difficulties   Cognition Arousal/Alertness: Awake/alert Behavior During Therapy: Flat affect Overall Cognitive Status: Within Functional Limits for tasks assessed                                 General Comments: for basic info    General Comments  VSS     Exercises     Shoulder Instructions      Home Living Family/patient expects to be discharged to:: Private residence Living Arrangements: Spouse/significant other Available Help at Discharge: Family;Available PRN/intermittently Type of Home: House Home Access: Stairs to enter [employer] of Steps: 5 Entrance Stairs-Rails: Right Home Layout: One level     Bathroom Shower/Tub: Teacher, early years/pre: Standard     Home Equipment: Environmental consultant - 2 wheels;Cane - single point;Grab bars - tub/shower   Additional Comments: pt's spouse with dementia and unable to provide current level of assist       Prior Functioning/Environment Level of Independence: Independent                 OT Problem List: Decreased activity tolerance;Impaired balance (sitting and/or standing);Decreased safety awareness;Decreased knowledge of use of DME or AE;Decreased knowledge of precautions;Impaired UE functional use;Pain      OT Treatment/Interventions: Self-care/ADL training;DME and/or AE instruction;Therapeutic activities;Patient/family education;Balance training;Therapeutic exercise    OT Goals(Current goals can be found in the care plan section) Acute Rehab OT Goals Patient Stated Goal: pt did not state  OT Goal Formulation: With patient Time For Goal Achievement: 11/26/20 Potential to Achieve Goals: Good ADL Goals Pt Will Perform Eating: with modified independence;with adaptive utensils Pt Will Perform Grooming: with min guard assist;standing Pt Will Perform Upper Body Bathing: with min  assist;with adaptive equipment;sitting Pt Will Perform Lower Body Bathing: with min assist;with adaptive equipment;sit to/from stand Pt Will Perform Lower Body Dressing: with min assist;sit to/from stand;with adaptive equipment Pt Will Transfer to Toilet: with min guard assist;ambulating;regular height toilet;grab bars Pt Will Perform Toileting - Clothing Manipulation and hygiene: with min guard assist;sit to/from stand;with adaptive equipment  OT Frequency: Min 2X/week   Barriers to D/C: Decreased caregiver support  spouse with dementia and unable to provide necessary level of assist        Co-evaluation              AM-PAC OT "6 Clicks" Daily Activity     Outcome Measure Help from another person eating meals?: A Little Help from another person taking care of personal grooming?: A Little Help from another person toileting, which includes using toliet, bedpan, or urinal?: A Lot Help from another person bathing (including washing, rinsing, drying)?: A Lot Help from another person to put on and taking off regular upper body clothing?: A Lot Help from another person to put on and taking off regular lower body clothing?: Total 6 Click Score: 13   End of Session Nurse Communication: Mobility status;Patient requests pain meds  Activity Tolerance: Patient limited by fatigue;Patient limited by pain Patient left: in chair;with call bell/phone within reach;with chair alarm set  OT Visit  Diagnosis: Unsteadiness on feet (R26.81);Pain Pain - part of body:  (abdomen )                Time: 1599-6895 OT Time Calculation (min): 17 min Charges:  OT General Charges $OT Visit: 1 Visit OT Evaluation $OT Eval Moderate Complexity: 1 Mod  Nilsa Nutting., OTR/L Acute Rehabilitation Services Pager [phone] Office [phone]   Lucille Passy M 11/12/2020, 8:41 AM

## 2020-11-12 NOTE — TOC Progression Note (Signed)
Transition of Care [facility]) - Progression Note    Patient Details  Name: Chloe Young MRN: [id number] Date of Birth: 1951/02/18  Transition of Care [facility]) CM/SW [place], [place] Phone Number: 11/12/2020, 12:12 PM  Clinical Narrative:     CSW met with patient at bedside. CSW introduced self and explained role. Patient confirmed she was agreeable to short tern rehab at [facility]. Patient states she wants to be able to take care of herself again.  CSW explained the SNF process. Patient states no questions or concerns at this time. Patient has received the covid vaccine and booster shot.   CSW will continue to follow and assist with discharge planning.   Thurmond Butts, MSW, [place] Clinical Social Worker   Expected Discharge Plan: Skilled Nursing Facility Barriers to Discharge: Continued Medical Work up  Expected Discharge Plan and Services Expected Discharge Plan: [place] In-house Referral: Clinical Social Work                                             Social Determinants of Health (SDOH) Interventions    Readmission Risk Interventions No flowsheet data found.

## 2020-11-12 NOTE — Progress Notes (Signed)
NGT came out spontaneously after coughing fit and sneeze. 100 ml out in suction canister. NGT intact but plugged upon inspection. Pt refusing new NGT at this time. Dr. Kennon Holter notified of this, no new orders received.

## 2020-11-12 NOTE — Consult Note (Signed)
[place] Nurse Consult Note: Patient receiving care in [facility] [phone] Reason for Consult: Start wound vac Wound type: Surgical abdominal wound Measurement: 20 cm x 4.5 cm 4.5 cm  Wound bed: Pink with golden adipose globules in the wound bed.  Drainage (amount, consistency, odor) Serosanguinous drainage on dressing that was removed.  Periwound: Intact Dressing procedure/placement/frequency: Started new NPWT today.  Filled wound with  1 piece of black foam,drape applied, immediate seal not obtained at 176mm Hg. Reinforced with drape. Possible ostomy skin barrier interference. Spoke with bedside RN, she will let me know if it starts to leak. Seal obtained at this time.  Patient needs to receive pre-procedure pain medications. Could not get any today because it was too soon since night shift had administered. Day shift will pass along to wait to give pain medication MWF so she can have prior to dressing change.  Patient tolerated procedure well  [place] nurse to change NPWT dressing M/W/F    Jocelyn Lamer L. Tamala Julian, MSN, RN, [place], [place], [facility] Wound Treatment Associate Pager [phone].

## 2020-11-13 DIAGNOSIS — K219 Gastro-esophageal reflux disease without esophagitis: Secondary | ICD-10-CM | POA: Diagnosis not present

## 2020-11-13 DIAGNOSIS — E782 Mixed hyperlipidemia: Secondary | ICD-10-CM | POA: Diagnosis not present

## 2020-11-13 DIAGNOSIS — K659 Peritonitis, unspecified: Secondary | ICD-10-CM | POA: Diagnosis not present

## 2020-11-13 DIAGNOSIS — I10 Essential (primary) hypertension: Secondary | ICD-10-CM | POA: Diagnosis not present

## 2020-11-13 LAB — GLUCOSE, CAPILLARY
Glucose-Capillary: 107 mg/dL — ABNORMAL HIGH (ref 70–99)
Glucose-Capillary: 117 mg/dL — ABNORMAL HIGH (ref 70–99)
Glucose-Capillary: 119 mg/dL — ABNORMAL HIGH (ref 70–99)
Glucose-Capillary: 124 mg/dL — ABNORMAL HIGH (ref 70–99)
Glucose-Capillary: 125 mg/dL — ABNORMAL HIGH (ref 70–99)

## 2020-11-13 LAB — COMPREHENSIVE METABOLIC PANEL
ALT: 35 U/L (ref 0–44)
AST: 40 U/L (ref 15–41)
Albumin: 2 g/dL — ABNORMAL LOW (ref 3.5–5.0)
Alkaline Phosphatase: 68 U/L (ref 38–126)
Anion gap: 8 (ref 5–15)
BUN: 8 mg/dL (ref 8–23)
CO2: 24 mmol/L (ref 22–32)
Calcium: 8.2 mg/dL — ABNORMAL LOW (ref 8.9–10.3)
Chloride: 104 mmol/L (ref 98–111)
Creatinine, Ser: 0.4 mg/dL — ABNORMAL LOW (ref 0.44–1.00)
GFR, Estimated: >60 mL/min (ref >60)
Glucose, Bld: 133 mg/dL — ABNORMAL HIGH (ref 70–99)
Potassium: 3.7 mmol/L (ref 3.5–5.1)
Sodium: 136 mmol/L (ref 135–145)
Total Bilirubin: 0.6 mg/dL (ref 0.3–1.2)
Total Protein: 5 g/dL — ABNORMAL LOW (ref 6.5–8.1)

## 2020-11-13 LAB — PHOSPHORUS: Phosphorus: 2.6 mg/dL (ref 2.5–4.6)

## 2020-11-13 LAB — CBC
HCT: 26.6 % — ABNORMAL LOW (ref 36.0–46.0)
Hemoglobin: 8.8 g/dL — ABNORMAL LOW (ref 12.0–15.0)
MCH: 31.5 pg (ref 26.0–34.0)
MCHC: 33.1 g/dL (ref 30.0–36.0)
MCV: 95.3 fL (ref 80.0–100.0)
Platelets: 277 10*3/uL (ref 150–400)
RBC: 2.79 MIL/uL — ABNORMAL LOW (ref 3.87–5.11)
RDW: 14.2 % (ref 11.5–15.5)
WBC: 8.7 10*3/uL (ref 4.0–10.5)
nRBC: 0.2 % (ref 0.0–0.2)

## 2020-11-13 LAB — CULTURE, BLOOD (ROUTINE X 2)
Culture: NO GROWTH
Culture: NO GROWTH
Special Requests: ADEQUATE

## 2020-11-13 LAB — MAGNESIUM: Magnesium: 1.8 mg/dL (ref 1.7–2.4)

## 2020-11-13 MED ORDER — HYDROCHLOROTHIAZIDE 12.5 MG PO CAPS
12.5000 mg | ORAL_CAPSULE | Freq: Every day | ORAL | Status: DC
  Administered 2020-11-13 – 2020-11-22 (×10): 12.5 mg via ORAL
  Filled 2020-11-13 (×10): qty 1

## 2020-11-13 MED ORDER — PREGABALIN 75 MG PO CAPS
150.0000 mg | ORAL_CAPSULE | Freq: Two times a day (BID) | ORAL | Status: DC
  Administered 2020-11-13 – 2020-11-22 (×20): 150 mg via ORAL
  Filled 2020-11-13 (×20): qty 2

## 2020-11-13 MED ORDER — TRAVASOL 10 % IV SOLN
INTRAVENOUS | Status: AC
  Filled 2020-11-13: qty 1056

## 2020-11-13 MED ORDER — KETOROLAC TROMETHAMINE 15 MG/ML IJ SOLN
15.0000 mg | Freq: Four times a day (QID) | INTRAMUSCULAR | Status: DC
  Administered 2020-11-13 – 2020-11-16 (×13): 15 mg via INTRAVENOUS
  Filled 2020-11-13 (×13): qty 1

## 2020-11-13 MED ORDER — ROSUVASTATIN CALCIUM 5 MG PO TABS
5.0000 mg | ORAL_TABLET | Freq: Every day | ORAL | Status: DC
  Administered 2020-11-13 – 2020-11-22 (×10): 5 mg via ORAL
  Filled 2020-11-13 (×10): qty 1

## 2020-11-13 MED ORDER — MAGNESIUM SULFATE 2 GM/50ML IV SOLN
2.0000 g | Freq: Once | INTRAVENOUS | Status: AC
  Administered 2020-11-13: 2 g via INTRAVENOUS
  Filled 2020-11-13: qty 50

## 2020-11-13 MED ORDER — MELOXICAM 7.5 MG PO TABS: 15.0000 mg | ORAL_TABLET | Freq: Every day | ORAL | Status: DC

## 2020-11-13 MED ORDER — ACETAMINOPHEN 500 MG PO TABS
1000.0000 mg | ORAL_TABLET | Freq: Four times a day (QID) | ORAL | Status: DC
  Administered 2020-11-13 – 2020-11-23 (×36): 1000 mg via ORAL
  Filled 2020-11-13 (×38): qty 2

## 2020-11-13 MED ORDER — POTASSIUM CHLORIDE 10 MEQ/50ML IV SOLN
10.0000 meq | INTRAVENOUS | Status: AC
  Administered 2020-11-13 (×2): 10 meq via INTRAVENOUS
  Filled 2020-11-13 (×2): qty 50

## 2020-11-13 NOTE — TOC Progression Note (Signed)
Transition of Care [facility]) - Progression Note    Patient Details  Name: Chloe Young MRN: [id number] Date of Birth: [birth year]  Transition of Care [facility]) CM/SW [place], LCSW Phone Number: 11/13/2020, 10:24 AM  Clinical Narrative:    CSW met with patient and informed patient she has had twp SNF bed offers. CSW informed patient they were [place] and reviewed Medicare.gov ratings. Patient reported she would be open to referring a little further out to see if any higher rated facilities accept her and to inform son of bed offers. CSW will continue to follow for discharge supports.    Expected Discharge Plan: [place] Barriers to Discharge: Continued Medical Work up  Expected Discharge Plan and Services Expected Discharge Plan: [place] In-house Referral: Clinical Social Work                                             Social Determinants of Health (SDOH) Interventions    Readmission Risk Interventions No flowsheet data found.

## 2020-11-13 NOTE — Progress Notes (Addendum)
PROGRESS NOTE    Chloe Young  KGU:[id number] DOB: 05-30-1951 DOA: 11/08/2020 PCP: Binnie Rail, MD    Brief Narrative:  Chloe Young was admitted to the hospital working diagnosis of septic shock due to acute bowel perforation and peritonitis.  69 year old female with past medical history for hypertension, dyslipidemia, chronic pain syndrome, osteopenia, status post surgical laminectomy who presented with abdominal pain. 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 She reported abdominal pain, with nausea and vomiting. EMS performed EKG which was suggestive of STEMI. On her initial physical examination her temperature was 33.1, heart rate 130s, she was tachypneic and hypotensive. Her lungs were clear to auscultation bilaterally, heart S1/S2, present, tachycardic, the abdomen was distended, diffusely tender to palpation.  CT of the abdomen with acute bowel perforation. Suspected small bowel.  Patient was taken to the OR for sigmoid colectomy with end colostomy. She developed destructive shock with persistent postoperative hypotension.  Patient required vasopressors and close monitoring in the ICU. Supportive medical therapy with antibiotics and analgesics.  Transferred to [facility] 11/10/2020  Patient continue to have persistent post-operative ileus. NG tube in place and tolerating well TPN.  Today NG tube has been removed and she has been allowed to have soft diet.   Assessment & Plan:   Principal Problem:   Peritonitis ([place]) Active Problems:   Hyperlipidemia   Essential hypertension   GERD (gastroesophageal reflux disease)   Severe sepsis (HCC)   Perforated abdominal viscus   1. Septic shock due to peritonitis, due to perforated bowel (present on admission).Sp sigmoid colectomy and colostomy. resolved ileus, NG tube has been removed and patient is tolerating soft diet.   Supportive medical care with antiacids and antiemetics.Wean downTPN per  protocol, continue with  insulin sq per protocol with TPN.  Tolerating well  IV Zosyn#6/7.Patient has remained afebrile.   Encourage mobility, out of bed to chair tid with meals, PT and OT  2. Acute right humeral fracture.Immbolization of the right upper extremity. Out of bed to chair.  Continue with as needed analgesics.   3. Alcohol use. 1 to 2 drinks per night, no signs of withdrawal.  No withdrawal signs or symptoms.   4. Hypertension/ dyslipidemia.Blood pressure today 135/75 mmHg. Started on HCTZ.  Continue with rosuvastatin.   Status is: Inpatient  Remains inpatient appropriate because:Inpatient level of care appropriate due to severity of illness   Dispo: The patient is from: Home              Anticipated d/c is to: Home              Anticipated d/c date is: 2 days              Patient currently is not medically stable to d/c.Ok to transfer to medical -surgical ward, discontinue telemetry.    DVT prophylaxis: Enoxaparin   Code Status:   full  Family Communication:  No family at the bedside      Nutrition Status: Nutrition Problem: Inadequate oral intake Etiology: altered GI function Signs/Symptoms: NPO status Interventions: TPN     Consultants:  Surgery  Procedures: sigmoid colectomy and end colostomy (Hartmann's procedure  Antimicrobials:  IV zosyn   Subjective: Patient is awake and alert, abdominal pain is improved, she is tolerating well soft diet with no nausea or vomiting. No chest pain and no dyspnea. Right upper extremity pain is well controlled.   Objective: Vitals:   11/13/20 0300 11/13/20 0400 11/13/20 1200 11/13/20 1300  BP: 123/65  117/78   Pulse: 98  90   Resp: 16  17 19   Temp:  98.9 F (37.2 C) 98.6 F (37 C)   TempSrc:  Oral Oral   SpO2: 95%  96% 96%  Weight:      Height:        Intake/Output Summary (Last 24 hours) at 11/13/2020 1505 Last data filed at 11/13/2020 0710 Gross per 24 hour  Intake --   Output 1550 ml  Net -1550 ml   Filed Weights   11/08/20 1304 11/08/20 2209  Weight: 59.9 kg 67.4 kg    Examination:   General: Not in pain or dyspnea. Deconditioned  Neurology: Awake and alert, non focal  E ENT: no pallor, no icterus, oral mucosa moist Cardiovascular: No JVD. S1-S2 present, rhythmic, no gallops, rubs, or murmurs. No lower extremity edema. Pulmonary: positive breath sounds bilaterally, with no wheezing, rhonchi or rales. Gastrointestinal. Abdomen soft and non tender. Ostomy bag in place Skin. No rashes Musculoskeletal: no joint deformities     Data Reviewed: I have personally reviewed following labs and imaging studies  CBC: Recent Labs  Lab 11/08/20 1236 11/08/20 1246 11/09/20 0805 11/09/20 1149 11/10/20 0142 11/11/20 0600 11/13/20 0410  WBC 9.0   < > 3.5* 4.2 4.7 6.9 8.7  NEUTROABS 7.0  --   --   --   --  5.7  --   HGB 13.2   < > 8.4* 8.2* 8.8* 8.3* 8.8*  HCT 40.9   < > 25.3* 25.3* 26.8* 26.2* 26.6*  MCV 97.8   < > 94.4 94.4 97.5 97.8 95.3  PLT 273   < > 163 160 175 233 277   < > = values in this interval not displayed.   Basic Metabolic Panel: Recent Labs  Lab 11/09/20 0805 11/10/20 0142 11/11/20 0600 11/12/20 0430 11/13/20 0410  NA 137 131* 136 139 136  K 2.8* 5.8* 3.0* 3.2* 3.7  CL 109 107 107 111 104  CO2 21* 18* 24 23 24   GLUCOSE 122* 567* 122* 139* 133*  BUN 9 9 9 8 8   CREATININE 0.64 0.67 0.56 0.50 0.40*  CALCIUM 7.4* 6.8* 7.9* 7.9* 8.2*  MG 1.6* 2.2 2.2 2.0 1.8  PHOS 1.7* 1.5* 1.7* 2.5 2.6   GFR: Estimated Creatinine Clearance: 60.3 mL/min (A) (by C-G formula based on SCr of 0.4 mg/dL (L)). Liver Function Tests: Recent Labs  Lab 11/08/20 1236 11/10/20 0142 11/11/20 0600 11/12/20 0430 11/13/20 0410  AST 54* 21 21 19  40  ALT 34 20 24 20  35  ALKPHOS 40 29* 39 49 68  BILITOT 1.5* 0.6 0.3 0.7 0.6  PROT 7.3 5.0* 4.9* 4.9* 5.0*  ALBUMIN 3.5 2.4* 2.2* 2.0* 2.0*   Recent Labs  Lab 11/08/20 1236  LIPASE 25   No  results for input(s): AMMONIA in the last 168 hours. Coagulation Profile: Recent Labs  Lab 11/08/20 1236  INR 1.3*   Cardiac Enzymes: No results for input(s): CKTOTAL, CKMB, CKMBINDEX, TROPONINI in the last 168 hours. BNP (last 3 results) No results for input(s): PROBNP in the last 8760 hours. HbA1C: No results for input(s): HGBA1C in the last 72 hours. CBG: Recent Labs  Lab 11/12/20 1145 11/12/20 1625 11/12/20 2359 11/13/20 0644 11/13/20 1230  GLUCAP 124* 139* 119* 124* 107*   Lipid Profile: Recent Labs    11/11/20 0600  TRIG 74   Thyroid Function Tests: No results for input(s): TSH, T4TOTAL, FREET4, T3FREE, THYROIDAB in the last 72 hours. Anemia  Panel: No results for input(s): VITAMINB12, FOLATE, FERRITIN, TIBC, IRON, RETICCTPCT in the last 72 hours.    Radiology Studies: I have reviewed all of the imaging during this hospital visit personally     Scheduled Meds: . acetaminophen  1,000 mg Oral Q6H  . Chlorhexidine Gluconate Cloth  6 each Topical Daily  . enoxaparin (LOVENOX) injection  40 mg Subcutaneous Daily  . hydrochlorothiazide  12.5 mg Oral Daily  . ketorolac  15 mg Intravenous Q6H  . methocarbamol  1,000 mg Oral TID  . pantoprazole (PROTONIX) IV  40 mg Intravenous QHS  . pregabalin  150 mg Oral BID  . rosuvastatin  5 mg Oral Daily  . sodium chloride flush  10-40 mL Intracatheter Q12H   Continuous Infusions: . piperacillin-tazobactam (ZOSYN)  IV 3.375 g (11/13/20 0951)  . TPN ADULT (ION) 75 mL/hr at 11/12/20 1813  . TPN ADULT (ION)       LOS: 5 days        Orange Hilligoss Gerome Apley, MD

## 2020-11-13 NOTE — Progress Notes (Signed)
General Surgery Follow Up Note  Subjective:    Overnight Issues:   Objective:  Vital signs for last 24 hours: Temp:  [98.3 F (36.8 C)-102.8 F (39.3 C)] 98.9 F (37.2 C) (11/20 0400) Pulse Rate:  [98-130] 98 (11/20 0300) Resp:  [14-25] 16 (11/20 0300) BP: (99-165)/(60-91) 123/65 (11/20 0300) SpO2:  [95 %-97 %] 95 % (11/20 0300)  Hemodynamic parameters for last 24 hours:    Intake/Output from previous day: 11/19 0701 - 11/20 0700 In: 1754 [I.V.:1604; IV Piggyback:150] Out: 1150 [Urine:1050; Stool:100]  Intake/Output this shift: Total I/O In: -  Out: 400 [Urine:400]  Vent settings for last 24 hours:    Physical Exam:  Gen: comfortable, no distress Neuro: non-focal exam HEENT: PERRL Neck: supple CV: RRR Pulm: unlabored breathing Abd: soft, NT, midline vac, ostomy PPP GU: clear yellow urine Extr: wwp, no edema, RUE in sling   Results for orders placed or performed during the hospital encounter of 11/08/20 (from the past 24 hour(s))  Glucose, capillary     Status: Abnormal   Collection Time: 11/12/20 11:45 AM  Result Value Ref Range   Glucose-Capillary 124 (H) 70 - 99 mg/dL  Glucose, capillary     Status: Abnormal   Collection Time: 11/12/20  4:25 PM  Result Value Ref Range   Glucose-Capillary 139 (H) 70 - 99 mg/dL  Glucose, capillary     Status: Abnormal   Collection Time: 11/12/20 11:59 PM  Result Value Ref Range   Glucose-Capillary 119 (H) 70 - 99 mg/dL  Comprehensive metabolic panel     Status: Abnormal   Collection Time: 11/13/20  4:10 AM  Result Value Ref Range   Sodium 136 135 - 145 mmol/L   Potassium 3.7 3.5 - 5.1 mmol/L   Chloride 104 98 - 111 mmol/L   CO2 24 22 - 32 mmol/L   Glucose, Bld 133 (H) 70 - 99 mg/dL   BUN 8 8 - 23 mg/dL   Creatinine, Ser 0.40 (L) 0.44 - 1.00 mg/dL   Calcium 8.2 (L) 8.9 - 10.3 mg/dL   Total Protein 5.0 (L) 6.5 - 8.1 g/dL   Albumin 2.0 (L) 3.5 - 5.0 g/dL   AST 40 15 - 41 U/L   ALT 35 0 - 44 U/L   Alkaline  Phosphatase 68 38 - 126 U/L   Total Bilirubin 0.6 0.3 - 1.2 mg/dL   GFR, Estimated >60 >60 mL/min   Anion gap 8 5 - 15  Magnesium     Status: None   Collection Time: 11/13/20  4:10 AM  Result Value Ref Range   Magnesium 1.8 1.7 - 2.4 mg/dL  Phosphorus     Status: None   Collection Time: 11/13/20  4:10 AM  Result Value Ref Range   Phosphorus 2.6 2.5 - 4.6 mg/dL  CBC     Status: Abnormal   Collection Time: 11/13/20  4:10 AM  Result Value Ref Range   WBC 8.7 4.0 - 10.5 K/uL   RBC 2.79 (L) 3.87 - 5.11 MIL/uL   Hemoglobin 8.8 (L) 12.0 - 15.0 g/dL   HCT 26.6 (L) 36 - 46 %   MCV 95.3 80.0 - 100.0 fL   MCH 31.5 26.0 - 34.0 pg   MCHC 33.1 30.0 - 36.0 g/dL   RDW 14.2 11.5 - 15.5 %   Platelets 277 150 - 400 K/uL   nRBC 0.2 0.0 - 0.2 %  Glucose, capillary     Status: Abnormal   Collection Time: 11/13/20  6:44 AM  Result Value Ref Range   Glucose-Capillary 124 (H) 70 - 99 mg/dL    Assessment & Plan:  Present on Admission: . Hyperlipidemia . Essential hypertension . GERD (gastroesophageal reflux disease) . Perforated abdominal viscus    LOS: 5 days   Additional comments:I reviewed the patient's new clinical lab test results.   and I reviewed the patients new imaging test results.    HTN HLD Chronic pain Recent hx of R humerus fracture - Dr. Veverly Fells recommends f/u in 2 weeks   Perforated sigmoid diverticulitis  S/p Hartmann's procedure 11/08/20 Dr. Barry Dienes - POD#5 - VAC M/W/F - adv to FLD - half TPN  - mobilize  - abx x5d - add toradol for pain control - resume home meds - on [place], MD Trauma & General Surgery Please use [email or web address] to contact on call provider  11/13/2020  *Care during the described time interval was provided by me. I have reviewed this patient's available data, including medical history, events of note, physical examination and test results as part of my evaluation.

## 2020-11-13 NOTE — Plan of Care (Signed)
  Problem: Clinical Measurements: Goal: Will remain free from infection Outcome: Progressing   Problem: Education: Goal: Knowledge of General Education information will improve Description: Including pain rating scale, medication(s)/side effects and non-pharmacologic comfort measures Outcome: Progressing   Problem: Activity: Goal: Risk for activity intolerance will decrease Outcome: Progressing   Problem: Pain Managment: Goal: General experience of comfort will improve Outcome: Progressing   Problem: Safety: Goal: Ability to remain free from injury will improve Outcome: Progressing   Problem: Skin Integrity: Goal: Risk for impaired skin integrity will decrease Outcome: Progressing

## 2020-11-13 NOTE — Progress Notes (Signed)
PHARMACY - TOTAL PARENTERAL NUTRITION CONSULT NOTE  Indication: Prolonged ileus expected  Patient Measurements: Height: 5\' 3"  (160 cm) Weight: 67.4 kg (148 lb 9.4 oz) IBW/kg (Calculated) : 52.4   Body mass index is 26.32 kg/m.  Assessment:  89 YOF with perforated sigmoid diverticulitis who underwent Hartmann's procedure on 11/08/20. Patient developed shock post-op and required vasopressors in ICU, transferred back to the floor on 11/10/20. Patient NPO with NGT drainage bilious. Pre-albumin significantly low at <5, and Surgery expects post-op ileus. Pharmacy consulted to [name].   Patient reports alcohol use at home (~14 standard drinks of alcohol per week). Patient reports eating well up until her pain started.  Glucose / Insulin: no hx DM - A1c 5.3%. CBGs well controlled with minimal SSI use. Electrolytes: K 3.7 post 4 runs (goal >/= 4 w/ ileus), Mag 1.8 (goal >/= 2 for ileus), Na/Phos low normal Renal: SCr < 1 stable, BUN WNL LFTs / TGs: LFTs / TG / tbili WNL Prealbumin / albumin: Pre-albumin <5; albumin 2 Intake / Output; MIVF: UOP 0.6 ml/kg/hr, NGT pulled 11/18, stool 115mL, net +5.4L (up) GI Imaging: None since start of TPN Surgeries / Procedures: None since start of TPN  Central access: PICC placed 11/10/20 TPN start date: 11/10/20  Nutritional Goals (per RD rec on 11/17): Kcal: 1650-1850; Protein: 90-110g; Fluid: >/=1.7L  Current Nutrition:  Starting CLD 11/19 TPN  Plan:  Tweak TPN to provide more volume for electrolytes supplementation in TPN  TPN at 80 ml/hr will provide 105g AA, 250g CHO and 54g ILE for a total of 1809 kCal, meeting 100% of patient needs Electrolytes in TPN: incr Na 45mEq/L, incr K 66mEq/L, Ca 80mEq/L, incr Mag 90mEq/L, incr Phos 18 mmol/L, Cl:Ac 1:2 Add standard MVI, trace element, thiamine and folic acid to TPN.  D/C PO MVI, thiamine and folic acid due to pain and questionable absorption.  D/C SSI/CBG checks KCL x 2 runs Mag sulfate 2gm IV  x1 F/U AM labs, toleration of diet/resolution of ileus and ability to wean TPN  Chloe Young, PharmD, BCPS, [place] 11/13/2020, 8:43 AM

## 2020-11-14 DIAGNOSIS — I10 Essential (primary) hypertension: Secondary | ICD-10-CM | POA: Diagnosis not present

## 2020-11-14 DIAGNOSIS — E782 Mixed hyperlipidemia: Secondary | ICD-10-CM | POA: Diagnosis not present

## 2020-11-14 DIAGNOSIS — K219 Gastro-esophageal reflux disease without esophagitis: Secondary | ICD-10-CM | POA: Diagnosis not present

## 2020-11-14 DIAGNOSIS — K659 Peritonitis, unspecified: Secondary | ICD-10-CM | POA: Diagnosis not present

## 2020-11-14 LAB — BASIC METABOLIC PANEL
Anion gap: 9 (ref 5–15)
BUN: 9 mg/dL (ref 8–23)
CO2: 25 mmol/L (ref 22–32)
Calcium: 8.4 mg/dL — ABNORMAL LOW (ref 8.9–10.3)
Chloride: 103 mmol/L (ref 98–111)
Creatinine, Ser: 0.44 mg/dL (ref 0.44–1.00)
GFR, Estimated: >60 mL/min (ref >60)
Glucose, Bld: 118 mg/dL — ABNORMAL HIGH (ref 70–99)
Potassium: 4.4 mmol/L (ref 3.5–5.1)
Sodium: 137 mmol/L (ref 135–145)

## 2020-11-14 LAB — GLUCOSE, CAPILLARY
Glucose-Capillary: 124 mg/dL — ABNORMAL HIGH (ref 70–99)
Glucose-Capillary: 192 mg/dL — ABNORMAL HIGH (ref 70–99)
Glucose-Capillary: 85 mg/dL (ref 70–99)
Glucose-Capillary: 108 mg/dL — ABNORMAL HIGH (ref 70–99)

## 2020-11-14 LAB — MAGNESIUM: Magnesium: 2.1 mg/dL (ref 1.7–2.4)

## 2020-11-14 MED ORDER — ADULT MULTIVITAMIN W/MINERALS CH
1.0000 | ORAL_TABLET | Freq: Every day | ORAL | Status: DC
  Administered 2020-11-14 – 2020-11-22 (×9): 1 via ORAL
  Filled 2020-11-14 (×9): qty 1

## 2020-11-14 MED ORDER — ADULT MULTIVITAMIN W/MINERALS CH: 1.0000 | ORAL_TABLET | Freq: Every day | ORAL | Status: DC

## 2020-11-14 MED ORDER — PANTOPRAZOLE SODIUM 40 MG PO TBEC
40.0000 mg | DELAYED_RELEASE_TABLET | Freq: Every day | ORAL | Status: DC
  Administered 2020-11-14 – 2020-11-22 (×9): 40 mg via ORAL
  Filled 2020-11-14 (×9): qty 1

## 2020-11-14 MED ORDER — METOPROLOL TARTRATE 25 MG PO TABS
25.0000 mg | ORAL_TABLET | Freq: Two times a day (BID) | ORAL | Status: DC
  Administered 2020-11-14 – 2020-11-22 (×18): 25 mg via ORAL
  Filled 2020-11-14 (×18): qty 1

## 2020-11-14 MED ORDER — CALCIUM CARBONATE-VITAMIN D 500-200 MG-UNIT PO TABS
1.0000 | ORAL_TABLET | Freq: Every day | ORAL | Status: DC
  Administered 2020-11-14 – 2020-11-22 (×9): 1 via ORAL
  Filled 2020-11-14 (×9): qty 1

## 2020-11-14 MED ORDER — FLUTICASONE PROPIONATE 50 MCG/ACT NA SUSP
1.0000 | Freq: Every day | NASAL | Status: DC
  Administered 2020-11-14 – 2020-11-22 (×9): 1 via NASAL
  Filled 2020-11-14: qty 16

## 2020-11-14 MED ORDER — FOLIC ACID 1 MG PO TABS
1.0000 mg | ORAL_TABLET | Freq: Every day | ORAL | Status: DC
  Administered 2020-11-14 – 2020-11-22 (×9): 1 mg via ORAL
  Filled 2020-11-14 (×9): qty 1

## 2020-11-14 MED ORDER — THIAMINE HCL 100 MG PO TABS
100.0000 mg | ORAL_TABLET | Freq: Every day | ORAL | Status: DC
  Administered 2020-11-14 – 2020-11-22 (×9): 100 mg via ORAL
  Filled 2020-11-14 (×9): qty 1

## 2020-11-14 NOTE — Progress Notes (Addendum)
PHARMACY - TOTAL PARENTERAL NUTRITION CONSULT NOTE  Indication: Prolonged ileus expected  Patient Measurements: Height: 5\' 3"  (160 cm) Weight: 73.4 kg (161 lb 13.1 oz) IBW/kg (Calculated) : 52.4   Body mass index is 28.66 kg/m.  Assessment:  49 YOF with perforated sigmoid diverticulitis who underwent Hartmann's procedure on 11/08/20. Patient developed shock post-op and required vasopressors in ICU, transferred back to the floor on 11/10/20. Patient NPO with NGT drainage bilious. Pre-albumin significantly low at <5, and Surgery expects post-op ileus. Pharmacy consulted to [facility].   Patient reports alcohol use at home (~14 standard drinks of alcohol per week). Patient reports eating well up until her pain started.  Glucose / Insulin: no hx DM, A1c 5.3%. CBGs controlled except one outlier.  SSI D/C'ed 11/13/20. Electrolytes: K 4.4 post 2 runs, Mag 2.1 post 2gm, others WNL Renal: SCr < 1 stable, BUN WNL LFTs / TGs: LFTs / TG / tbili WNL Prealbumin / albumin: Pre-albumin <5; albumin 2 Intake / Output; MIVF: UOP 1.4 ml/kg/hr, NGT pulled 11/18, stool 491mL, net +5.6L (up) GI Imaging: None since start of TPN Surgeries / Procedures: None since start of TPN  Central access: PICC placed 11/10/20 TPN start date: 11/10/20  Nutritional Goals (per RD rec on 11/17): Kcal: 1650-1850; Protein: 90-110g; Fluid: >/=1.7L  Current Nutrition:  TPN Advanced to soft diet - consume 50-60% of meals  Plan:  D/C TPN per Surgery - reduce TPN to 40 ml/hr, then stop at 12N (RN and IV team aware) Daily multivitamin, folate 1mg  PO and thiamine 100mg  Change Protonix to PO D/C TPN labs and nursing care orders  Kamiah Fite D. Mina Marble, PharmD, BCPS, [place] 11/14/2020, 9:44 AM

## 2020-11-14 NOTE — Progress Notes (Signed)
General Surgery Follow Up Note  Subjective:    Overnight Issues:  No acute changes. Patient says she is tolerating PO intake but has minimal appetite. Remains on TPN. Colostomy functioning. No nausea/vomiting, pain controlled.  Objective:  Vital signs for last 24 hours: Temp:  [98.3 F (36.8 C)-99.3 F (37.4 C)] 98.3 F (36.8 C) (11/21 0727) Pulse Rate:  [90-110] 99 (11/21 0727) Resp:  [16-20] 20 (11/21 0319) BP: (103-141)/(63-82) 129/76 (11/21 0727) SpO2:  [87 %-100 %] 87 % (11/21 0727) Weight:  [73.4 kg] 73.4 kg (11/21 0424)  Hemodynamic parameters for last 24 hours:    Intake/Output from previous day: 11/20 0701 - 11/21 0700 In: 3295.7 [P.O.:1310; I.V.:1709.9; IV Piggyback:275.8] Out: 2800 [Urine:2400; Stool:400]  Intake/Output this shift: Total I/O In: 183.8 [P.O.:120; IV Piggyback:63.8] Out: 300 [Urine:300]  Vent settings for last 24 hours:    Physical Exam:  Gen: comfortable, no distress Neuro: non-focal exam HEENT: PERRL Neck: supple CV: RRR Pulm: unlabored breathing Abd: soft, NT, midline vac, ostomy productive of stool GU: clear yellow urine Extr: wwp, no edema, RUE in sling   Results for orders placed or performed during the hospital encounter of 11/08/20 (from the past 24 hour(s))  Glucose, capillary     Status: Abnormal   Collection Time: 11/13/20 12:30 PM  Result Value Ref Range   Glucose-Capillary 107 (H) 70 - 99 mg/dL  Glucose, capillary     Status: Abnormal   Collection Time: 11/13/20  3:39 PM  Result Value Ref Range   Glucose-Capillary 117 (H) 70 - 99 mg/dL   Comment 1 Notify RN   Glucose, capillary     Status: Abnormal   Collection Time: 11/13/20 11:30 PM  Result Value Ref Range   Glucose-Capillary 125 (H) 70 - 99 mg/dL   Comment 1 Notify RN    Comment 2 Document in Chart   Glucose, capillary     Status: Abnormal   Collection Time: 11/14/20  5:43 AM  Result Value Ref Range   Glucose-Capillary 124 (H) 70 - 99 mg/dL   Comment 1  Notify RN    Comment 2 Document in Chart   Basic metabolic panel     Status: Abnormal   Collection Time: 11/14/20  6:45 AM  Result Value Ref Range   Sodium 137 135 - 145 mmol/L   Potassium 4.4 3.5 - 5.1 mmol/L   Chloride 103 98 - 111 mmol/L   CO2 25 22 - 32 mmol/L   Glucose, Bld 118 (H) 70 - 99 mg/dL   BUN 9 8 - 23 mg/dL   Creatinine, Ser 0.44 0.44 - 1.00 mg/dL   Calcium 8.4 (L) 8.9 - 10.3 mg/dL   GFR, Estimated >60 >60 mL/min   Anion gap 9 5 - 15  Magnesium     Status: None   Collection Time: 11/14/20  6:45 AM  Result Value Ref Range   Magnesium 2.1 1.7 - 2.4 mg/dL  Glucose, capillary     Status: Abnormal   Collection Time: 11/14/20  7:30 AM  Result Value Ref Range   Glucose-Capillary 192 (H) 70 - 99 mg/dL    Assessment & Plan:  Present on Admission: . Hyperlipidemia . Essential hypertension . GERD (gastroesophageal reflux disease) . Perforated abdominal viscus    LOS: 6 days   Additional comments:I reviewed the patient's new clinical lab test results.   and I reviewed the patients new imaging test results.    HTN HLD Chronic pain Recent hx of R humerus fracture -  Dr. Veverly Fells recommends f/u in 2 weeks   Perforated sigmoid diverticulitis  S/p Hartmann's procedure 11/08/20 Dr. Barry Dienes - POD#6 - VAC M/W/F - Soft diet - Stop TPN and allow PO intake - Antibiotics off today - mobilize  - resume home meds - on [place], MD [place], Hepatobiliary and Pancreatic Surgery 11/14/20 9:17 AM

## 2020-11-14 NOTE — TOC Progression Note (Signed)
Transition of Care [facility]) - Progression Note    Patient Details  Name: Chloe Young MRN: [id number] Date of Birth: November 25, 1951  Transition [facility]) CM/SW [place], LCSW Phone Number: 11/14/2020, 9:16 AM  Clinical Narrative: CSW reviewed responses from referring out to further facilities. CSW attained one new bed offer at [facility]. CSW called patient's son and went over the bed offers over the phone. CSW notes no response from [place], [place], or [facility]. CSW notes request to give them more time to respond. TOC team will follow up with patient tomorrow to see if new bed offers are made prior to selection.      Expected Discharge Plan: [place] Barriers to Discharge: Continued Medical Work up  Expected Discharge Plan and Services Expected Discharge Plan: [place] In-house Referral: Clinical Social Work                                             Social Determinants of Health (SDOH) Interventions    Readmission Risk Interventions No flowsheet data found.

## 2020-11-14 NOTE — Progress Notes (Signed)
PROGRESS NOTE    IVY PURYEAR  HUT:[id number] DOB: [birth year] DOA: 11/08/2020 PCP: Binnie Rail, MD    Brief Narrative:  Mrs. Zackery was admitted to the hospital working diagnosis of septic shock due to acute bowel perforation and peritonitis.  69 year old female with past medical history for hypertension, dyslipidemia, chronic pain syndrome, osteopenia, status post surgical laminectomy who presented with abdominal pain. 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 She reported abdominal pain, with nausea and vomiting. EMS performed EKG which was suggestive of STEMI. On her initial physical examination her temperature was 33.1, heart rate 130s, she was tachypneic and hypotensive. Her lungs were clear to auscultation bilaterally, heart S1/S2, present, tachycardic, the abdomen was distended, diffusely tender to palpation.  CT of the abdomen with acute bowel perforation. Suspected small bowel.  Patient was taken to the OR for sigmoid colectomy with end colostomy. She developed destructive shock with persistent postoperative hypotension.  Patient required vasopressors and close monitoring in the ICU. Supportive medical therapy with antibiotics and analgesics.  Transferred to [facility] 11/10/2020  Patient continue to have persistent post-operative ileus. NG tube in place and tolerating well TPN.  11/20 NG tube has been removed and she has been allowed to have soft diet.    Assessment & Plan:   Principal Problem:   Peritonitis ([place]) Active Problems:   Hyperlipidemia   Essential hypertension   GERD (gastroesophageal reflux disease)   Severe sepsis (HCC)   Perforated abdominal viscus   1. Septic shock due to peritonitis, due to perforated bowel (present on admission).Sp sigmoid colectomy and colostomy/ postoperative ileus.  Her ileus has resolved, diet has been advanced with good toleration.  Will complete antibiotic therapy today, Zosyn.   Continue  taper off TPN per protocol.   Plan to transfer to SNF for continue recovery.   2. Acute right humeral fracture.Continue with immbolization of the right upper extremity.she has been out of bed to chair with assistance, will need further therapy at SNF. Marland Kitchen Continue with as needed analgesics.   3. Alcohol use. 1 to 2 drinks per night, no signs of withdrawal. No current clinical sings of withdrawal, continue neuro checks per unit protocol.    4. Hypertension/ dyslipidemia.resumed on antihypertensive regimen with hctz and metoprolol. Continue with rosuvastatin with good toleration.   Status is: Inpatient  Remains inpatient appropriate because:Inpatient level of care appropriate due to severity of illness   Dispo: The patient is from: Home              Anticipated d/c is to: SNF              Anticipated d/c date is: 2 days              Patient currently is not medically stable to d/c.   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 Tolerating po well, no nausea or vomiting, right upper extremity with no pain. Patient continue to be very weak and deconditioned.   Objective: Vitals:   11/14/20 0319 11/14/20 0424 11/14/20 0727 11/14/20 1102  BP: 103/63  129/76 123/76  Pulse: 95  99  99  Resp: 20     Temp: 98.4 F (36.9 C)  98.3 F (36.8 C) 98.6 F (37 C)  TempSrc: Oral  Oral Oral  SpO2: 95%  (!) 87% 97%  Weight:  73.4 kg    Height:        Intake/Output Summary (Last 24 hours) at 11/14/2020 1240 Last data filed at 11/14/2020 0910 Gross per 24 hour  Intake 3479.46 ml  Output 3200 ml   Net 279.46 ml   Filed Weights   11/08/20 1304 11/08/20 2209 11/14/20 0424  Weight: 59.9 kg 67.4 kg 73.4 kg    Examination:   General: Not in pain or dyspnea, deconditioned  Neurology: Awake and alert, non focal  E ENT: mild pallor, no icterus, oral mucosa moist Cardiovascular: No JVD. S1-S2 present, rhythmic, no gallops, rubs, or murmurs. No lower extremity edema. Pulmonary: positive breath sounds bilaterally, with no wheezing, rhonchi or rales. Gastrointestinal. Abdomen soft and non tender, colostomy bag in place.  Skin. No rashes Musculoskeletal: sling to upper extremity     Data Reviewed: I have personally reviewed following labs and imaging studies  CBC: Recent Labs  Lab 11/08/20 1236 11/08/20 1246 11/09/20 0805 11/09/20 1149 11/10/20 0142 11/11/20 0600 11/13/20 0410  WBC 9.0   < > 3.5* 4.2 4.7 6.9 8.7  NEUTROABS 7.0  --   --   --   --  5.7  --   HGB 13.2   < > 8.4* 8.2* 8.8* 8.3* 8.8*  HCT 40.9   < > 25.3* 25.3* 26.8* 26.2* 26.6*  MCV 97.8   < > 94.4 94.4 97.5 97.8 95.3  PLT 273   < > 163 160 175 233 277   < > = values in this interval not displayed.   Basic Metabolic Panel: Recent Labs  Lab 11/09/20 0805 11/09/20 0805 11/10/20 0142 11/11/20 0600 11/12/20 0430 11/13/20 0410 11/14/20 0645  NA 137   < > 131* 136 139 136 137  K 2.8*   < > 5.8* 3.0* 3.2* 3.7 4.4  CL 109   < > 107 107 111 104 103  CO2 21*   < > 18* 24 23 24 25   GLUCOSE 122*   < > 567* 122* 139* 133* 118*  BUN 9   < > 9 9 8 8 9   CREATININE 0.64   < > 0.67 0.56 0.50 0.40* 0.44  CALCIUM 7.4*   < > 6.8* 7.9* 7.9* 8.2* 8.4*  MG 1.6*   < > 2.2 2.2 2.0 1.8 2.1  PHOS 1.7*  --  1.5* 1.7* 2.5 2.6  --    < > = values in this interval not displayed.   GFR: Estimated Creatinine Clearance: 62.8 mL/min (by C-G formula based on SCr of 0.44 mg/dL). Liver Function Tests: Recent Labs  Lab 11/08/20 1236 11/10/20 0142 11/11/20 0600 11/12/20 0430 11/13/20 0410  AST 54* 21 21 19  40  ALT 34 20 24  20  35  ALKPHOS 40 29* 39 49 68  BILITOT 1.5* 0.6 0.3 0.7 0.6  PROT 7.3 5.0* 4.9* 4.9* 5.0*  ALBUMIN 3.5 2.4* 2.2* 2.0* 2.0*   Recent Labs  Lab 11/08/20 1236  LIPASE 25   No results for input(s): AMMONIA in the last 168 hours. Coagulation Profile: Recent Labs  Lab 11/08/20 1236  INR 1.3*   Cardiac Enzymes: No results for input(s): CKTOTAL, CKMB, CKMBINDEX, TROPONINI in the last 168 hours. BNP (last 3 results) No results for input(s): PROBNP in the last 8760 hours. HbA1C:  No results for input(s): HGBA1C in the last 72 hours. CBG: Recent Labs  Lab 11/13/20 1230 11/13/20 1539 11/13/20 2330 11/14/20 0543 11/14/20 0730  GLUCAP 107* 117* 125* 124* 192*   Lipid Profile: No results for input(s): CHOL, HDL, LDLCALC, TRIG, CHOLHDL, LDLDIRECT in the last 72 hours. Thyroid Function Tests: No results for input(s): TSH, T4TOTAL, FREET4, T3FREE, THYROIDAB in the last 72 hours. Anemia Panel: No results for input(s): VITAMINB12, FOLATE, FERRITIN, TIBC, IRON, RETICCTPCT in the last 72 hours.    Radiology Studies: I have reviewed all of the imaging during this hospital visit personally     Scheduled Meds: . acetaminophen  1,000 mg Oral Q6H  . Chlorhexidine Gluconate Cloth  6 each Topical Daily  . enoxaparin (LOVENOX) injection  40 mg Subcutaneous Daily  . folic acid  1 mg Oral QHS  . hydrochlorothiazide  12.5 mg Oral Daily  . ketorolac  15 mg Intravenous Q6H  . methocarbamol  1,000 mg Oral TID  . multivitamin with minerals  1 tablet Oral QHS  . pantoprazole  40 mg Oral QHS  . pregabalin  150 mg Oral BID  . rosuvastatin  5 mg Oral Daily  . sodium chloride flush  10-40 mL Intracatheter Q12H  . thiamine  100 mg Oral QHS   Continuous Infusions: . piperacillin-tazobactam (ZOSYN)  IV 12.5 mL/hr at 11/14/20 0738  . TPN ADULT (ION) 80 mL/hr at 11/13/20 2300     LOS: 6 days        Breckin Savannah Gerome Apley, MD

## 2020-11-14 NOTE — Consult Note (Signed)
[place] Nurse wound follow up Received page from bedside RN Rod Holler. Wound Vac is saying blocked and she can not get it to stop. Instructed her to remove dressing and place moistened saline gauze over ABD wound and cover with dry 4 x 4 and ABD pad. [place] nurse will see patient Monday AM to restart the VAC.  Cathlean Marseilles Tamala Julian, MSN, RN, [place], Lysle Pearl, [facility] Wound Treatment Associate Pager [phone]

## 2020-11-15 DIAGNOSIS — K659 Peritonitis, unspecified: Secondary | ICD-10-CM | POA: Diagnosis not present

## 2020-11-15 DIAGNOSIS — K219 Gastro-esophageal reflux disease without esophagitis: Secondary | ICD-10-CM | POA: Diagnosis not present

## 2020-11-15 DIAGNOSIS — I10 Essential (primary) hypertension: Secondary | ICD-10-CM | POA: Diagnosis not present

## 2020-11-15 DIAGNOSIS — R198 Other specified symptoms and signs involving the digestive system and abdomen: Secondary | ICD-10-CM | POA: Diagnosis not present

## 2020-11-15 LAB — GLUCOSE, CAPILLARY
Glucose-Capillary: 101 mg/dL — ABNORMAL HIGH (ref 70–99)
Glucose-Capillary: 97 mg/dL (ref 70–99)
Glucose-Capillary: 98 mg/dL (ref 70–99)
Glucose-Capillary: 98 mg/dL (ref 70–99)

## 2020-11-15 LAB — CBC
HCT: 29.3 % — ABNORMAL LOW (ref 36.0–46.0)
Hemoglobin: 9.6 g/dL — ABNORMAL LOW (ref 12.0–15.0)
MCH: 30.7 pg (ref 26.0–34.0)
MCHC: 32.8 g/dL (ref 30.0–36.0)
MCV: 93.6 fL (ref 80.0–100.0)
Platelets: 453 10*3/uL — ABNORMAL HIGH (ref 150–400)
RBC: 3.13 MIL/uL — ABNORMAL LOW (ref 3.87–5.11)
RDW: 13.6 % (ref 11.5–15.5)
WBC: 10.3 10*3/uL (ref 4.0–10.5)
nRBC: 0.3 % — ABNORMAL HIGH (ref 0.0–0.2)

## 2020-11-15 MED ORDER — ENSURE ENLIVE PO LIQD
237.0000 mL | Freq: Three times a day (TID) | ORAL | Status: DC
  Administered 2020-11-15 – 2020-11-21 (×11): 237 mL via ORAL

## 2020-11-15 NOTE — TOC Progression Note (Signed)
Transition of Care [facility]) - Progression Note    Patient Details  Name: ZIYA COONROD MRN: [id number] Date of Birth: Apr 10, 1951  Transition of Care [facility]) CM/SW [place], [place] Phone Number: 11/15/2020, 11:50 AM  Clinical Narrative:     CSW spoke with patient's son,Eric- informed of bed offers. He requested to follow up with [facility] and [facility]-CSW contacted bother facilities to follow up on pending bed offers- CSW waiting on response.  Thurmond Butts, MSW, [place] Clinical Social Worker   Expected Discharge Plan: Skilled Nursing Facility Barriers to Discharge: Continued Medical Work up  Expected Discharge Plan and Services Expected Discharge Plan: [place] In-house Referral: Clinical Social Work                                             Social Determinants of Health (SDOH) Interventions    Readmission Risk Interventions No flowsheet data found.

## 2020-11-15 NOTE — Progress Notes (Signed)
Progress Note  7 Days Post-Op  Subjective: Patient reports she is tolerating diet although she prefers what she prepares at home. She is having bowel function. Up in chair this AM. Had a low grade temp of 100.5 overnight but none since. Pain well controlled with medication. Denies nausea.   Objective: Vital signs in last 24 hours: Temp:  [98.5 F (36.9 C)-100.5 F (38.1 C)] 98.6 F (37 C) (11/22 0726) Pulse Rate:  [99-100] 99 (11/22 0726) Resp:  [20] 20 (11/22 0726) BP: (123-175)/(68-76) 163/68 (11/22 0726) SpO2:  [95 %-98 %] 98 % (11/22 0726) Weight:  [71.2 kg] 71.2 kg (11/22 0455) Last BM Date: 11/14/20  Intake/Output from previous day: 11/21 0701 - 11/22 0700 In: 956.3 [P.O.:120; I.V.:736.3; IV Piggyback:99.9] Out: 925 [Urine:300; Stool:625] Intake/Output this shift: No intake/output data recorded.  PE: General: pleasant, WD,WNfemale Heart:RRR. Palpable radial and pedal pulses bilaterally Lungs: CTAB, no wheezes, rhonchi, or rales noted. Respiratory effort nonlabored Abd: soft,appropriately ttp, not distended, midline wound with VAC present, stoma viable with stool in ostomy appliance MS:RUE in shoulder sling   Lab Results:  Recent Labs    11/13/20 0410  WBC 8.7  HGB 8.8*  HCT 26.6*  PLT 277   BMET Recent Labs    11/13/20 0410 11/14/20 0645  NA 136 137  K 3.7 4.4  CL 104 103  CO2 24 25  GLUCOSE 133* 118*  BUN 8 9  CREATININE 0.40* 0.44  CALCIUM 8.2* 8.4*   PT/INR No results for input(s): LABPROT, INR in the last 72 hours. CMP     Component Value Date/Time   NA 137 11/14/2020 0645   K 4.4 11/14/2020 0645   CL 103 11/14/2020 0645   CO2 25 11/14/2020 0645   GLUCOSE 118 (H) 11/14/2020 0645   BUN 9 11/14/2020 0645   CREATININE 0.44 11/14/2020 0645   CALCIUM 8.4 (L) 11/14/2020 0645   PROT 5.0 (L) 11/13/2020 0410   ALBUMIN 2.0 (L) 11/13/2020 0410   AST 40 11/13/2020 0410   ALT 35 11/13/2020 0410   ALKPHOS 68 11/13/2020 0410   BILITOT  0.6 11/13/2020 0410   GFRNONAA >60 11/14/2020 0645   GFRAA >90 01/15/2012 0344   Lipase     Component Value Date/Time   LIPASE 25 11/08/2020 1236       Studies/Results: No results found.  Anti-infectives: Anti-infectives (From admission, onward)   Start     Dose/Rate Route Frequency Ordered Stop   11/09/20 2330  piperacillin-tazobactam (ZOSYN) IVPB 3.375 g       "Followed by" Linked Group Details   3.375 g 12.5 mL/hr over 240 Minutes Intravenous Every 8 hours 11/08/20 1704 11/14/20 2019   11/08/20 2200  ceFEPIme (MAXIPIME) 2 g in sodium chloride 0.9 % 100 mL IVPB  Status:  Discontinued        2 g 200 mL/hr over 30 Minutes Intravenous Every 12 hours 11/08/20 1335 11/08/20 1638   11/08/20 1830  piperacillin-tazobactam (ZOSYN) IVPB 3.375 g        3.375 g 12.5 mL/hr over 240 Minutes Intravenous To Surgery 11/08/20 1823 11/09/20 1830   11/08/20 1715  piperacillin-tazobactam (ZOSYN) IVPB 3.375 g       "Followed by" Linked Group Details   3.375 g 100 mL/hr over 30 Minutes Intravenous  Once 11/08/20 1704 11/13/20 1030   11/08/20 1245  ceFEPIme (MAXIPIME) 2 g in sodium chloride 0.9 % 100 mL IVPB        2 g 200 mL/hr over 30 Minutes  Intravenous  Once 11/08/20 1243 11/08/20 1338       Assessment/Plan HTN HLD Chronic pain Recent hx of R humerus fracture -Dr. 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 Barry Dienes - POD#7 -VAC M/W/F -good bowel function - cont Soft diet - Antibiotics stopped 11/21, Tmax 100.5 overnight, check CBC  FEN: soft diet VTE: lovenox ID: cefepime 11/15; Zosyn 11/15>11/21  LOS: 7 days    Norm Parcel , [facility] 11/15/2020, 10:30 AM Please see Amion for pager number during day hours 7:00am-4:30pm

## 2020-11-15 NOTE — Consult Note (Signed)
[place] Nurse wound follow up Patient receiving care in [facility] 4N08 Wound type: Surgical wound Wound bed: Pink with golden adipose globules in the wound bed.  Drainage (amount, consistency, odor) Serosanguinous drainage on dressing that was removed.  Dressing procedure/placement/frequency: Wound Vac restarted this morning. The NPWT was giving a blockage message this weekend. Bedside nurse removed dressing and placed NS dressing until [place] nurse could come this morning to restart.  Filled wound with  1 piece of black foam,drape applied, immediate seal obtainedat 127mm Hg. Have bedside nurse check with patient to see if she needs pain medication before procedure. Patient tolerated procedure well. [place] nurse to change NPWT dressing M/W/F   [facility] Nurse ostomy follow up Stoma type/location: LLQ end colostomy Stomal assessment/size: 1 3/4" budded, pink, moist Peristomal assessment: Intact Output: Liquid [place] Ostomy pouching: 2pc. 2 3/4" Kellie Simmering # 2) with 2 3/4" skin barrier Kellie Simmering # 2) Education provided: Patient was awake and alert enough for me to walk her through the entire process of removing and replacing pouch. Not teachable at this point and husband was not present. States she is supposed to go to a rehab facility when she is discharged. Patient states that she may go to CIR if approved.  Enrolled patient in [place] Start Discharge program: Yes previously  [place] Nurse will follow along with you for continued support with ostomy teaching and care  [place]. Tamala Julian, MSN, RN, [place], Lysle Pearl, [facility] Wound Treatment Associate Pager [phone]

## 2020-11-15 NOTE — Progress Notes (Signed)
Physical Therapy Treatment Patient Details Name: Chloe Young MRN: [id number] DOB: 1951/01/10 Today's Date: 11/15/2020    History of Present Illness Pt adm 11/15 with sepsis due to acute bowel perforation. Had 2 recent falls at home and has rt humerus fracture due to fall. 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     PT Comments    The pt was able to make good progress with mobility and PT goals at this session. She continues to improve with stability and decreased assist needed while increasing distance walked. The pt was able to complete multiple short bouts of walking in her room with use of SPC, and was able to improve from min/modA to stand with significant cues for positioning/technique with cane to minG with no additional verbal cues. The pt expressed increased confidence with cane use, but will continue to benefit from skilled PT to progress gait distance and stability prior to d/c.     Follow Up Recommendations  SNF     Equipment Recommendations  None recommended by PT    Recommendations for Other Services       Precautions / Restrictions Precautions Precautions: Fall;Shoulder Shoulder Interventions: Shoulder sling/immobilizer Precaution Comments: Per Dr. Veverly Fells' note 11/17, pt to keep sling on at all times with no ROM Rt shoulder, arm across body  Required Braces or Orthoses: Sling Restrictions Weight Bearing Restrictions: No    Mobility  Bed Mobility Overal bed mobility: Needs Assistance Bed Mobility: Supine to Sit     Supine to sit: Min guard;HOB elevated     General bed mobility comments: with significant increased time, effort, adn HOB raised, the pt was able to come to sitting EOB without assist  Transfers Overall transfer level: Needs assistance Equipment used: Straight cane Transfers: Sit to/from [employer] Transfers Sit to Stand: Min assist Stand  pivot transfers: Min assist       General transfer comment: minA to steady, verbal cues for placement of cane/hands  Ambulation/Gait Ambulation/Gait assistance: Min assist Gait Distance (Feet): 30 Feet (x2) Assistive device: Straight cane Gait Pattern/deviations: Step-through pattern;Decreased stride length Gait velocity: decreased Gait velocity interpretation: <1.8 ft/sec, indicate of risk for recurrent falls General Gait Details: pt ambulating in room with use of SPC. very short, shuffling steps with x3 instances of minor LOB requiring minA   Stairs             Wheelchair Mobility    Modified Rankin (Stroke Patients Only)       Balance Overall balance assessment: Needs assistance Sitting-balance support: No upper extremity supported Sitting balance-Leahy Scale: Fair Sitting balance - Comments: No UE support    Standing balance support: Single extremity supported Standing balance-Leahy Scale: Poor Standing balance comment: requires UE support                             Cognition Arousal/Alertness: Awake/alert Behavior During Therapy: Flat affect Overall Cognitive Status: Within Functional Limits for tasks assessed                                 General Comments: alert and oriented, able to follow all instructions      Exercises General Exercises - Lower Extremity Long Arc Quad: Strengthening;Both;10 reps;Seated (against mod resistance knee flex/extension) Heel Slides: Strengthening;Both;10 reps;Seated (against mod resistance knee flex/extension)  Heel Raises: AROM;Both;15 reps;Seated    General Comments General comments (skin integrity, edema, etc.): VSS on RA      Pertinent Vitals/Pain Pain Assessment: Faces Faces Pain Scale: Hurts little more Pain Location: abdomen Pain Descriptors / Indicators: Sore;Guarding Pain Intervention(s): Limited activity within patient's tolerance;Monitored during session;Repositioned            PT Goals (current goals can now be found in the care plan section) Acute Rehab PT Goals Patient Stated Goal: pt did not state  PT Goal Formulation: With patient Time For Goal Achievement: 11/23/20 Potential to Achieve Goals: Good Progress towards PT goals: Progressing toward goals    Frequency    Min 3X/week      PT Plan Current plan remains appropriate       AM-PAC PT "6 Clicks" Mobility   Outcome Measure  Help needed turning from your back to your side while in a flat bed without using bedrails?: A Little Help needed moving from lying on your back to sitting on the side of a flat bed without using bedrails?: A Little Help needed moving to and from a bed to a chair (including a wheelchair)?: A Little Help needed standing up from a chair using your arms (e.g., wheelchair or bedside chair)?: A Little Help needed to walk in hospital room?: A Little Help needed climbing 3-5 steps with a railing? : A Lot 6 Click Score: 17    End of Session Equipment Utilized During Treatment: Gait belt Activity Tolerance: Patient tolerated treatment well Patient left: with call bell/phone within reach;in chair Nurse Communication: Mobility status;Patient requests pain meds PT Visit Diagnosis: Other abnormalities of gait and mobility (R26.89);Muscle weakness (generalized) (M62.81);History of falling (Z91.81)     Time: 7793-9030 PT Time Calculation (min) (ACUTE ONLY): 23 min  Charges:  $Gait Training: 8-22 mins $Therapeutic Exercise: 8-22 mins                     Karma Ganja, PT, DPT   Acute Rehabilitation Department Pager #: [phone]   Otho Bellows 11/15/2020, 6:08 PM

## 2020-11-15 NOTE — Progress Notes (Signed)
PROGRESS NOTE    HARTLEE AMEDEE  NUU:[id number] DOB: [birth year] DOA: 11/08/2020 PCP: Binnie Rail, MD    Brief Narrative:  Mrs. Mcevoy was admitted to the hospital working diagnosis of septic shock due to acute bowel perforation and peritonitis.  69 year old female with past medical history for hypertension, dyslipidemia, chronic pain syndrome, osteopenia, status post surgical laminectomy who presented with abdominal pain. 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 She reported abdominal pain, with nausea and vomiting. EMS performed EKG which was suggestive of STEMI. On her initial physical examination her temperature was 33.1, heart rate 130s, she was tachypneic and hypotensive. Her lungs were clear to auscultation bilaterally, heart S1/S2, present, tachycardic, the abdomen was distended, diffusely tender to palpation.  CT of the abdomen with acute bowel perforation. Suspected small bowel.  Patient was taken to the OR for sigmoid colectomy with end colostomy. She developed destructive shock with persistent postoperative hypotension.  Patient required vasopressors and close monitoring in the ICU. Supportive medical therapy with antibiotics and analgesics.  Transferred to [facility] 11/10/2020  Patient continue to have persistent post-operative ileus. NG tube in place and tolerating well TPN.  11/20 NG tube has been removed and she has been allowed to have soft diet.    Assessment & Plan:   Principal Problem:   Peritonitis ([place]) Active Problems:   Hyperlipidemia   Essential hypertension   GERD (gastroesophageal reflux disease)   Severe sepsis ([facility])   Perforated abdominal viscus   1. Septic shock due to peritonitis, due to perforated bowel(present on admission).Sp sigmoid colectomy and colostomy/ postoperative ileus. Patient had fever spike 100.5 this am at 00:35 am. She has completed antibiotic therapy with Zosyn.   Tolerating po well, now off  TPN. She has been out of bed to chair, continue to be very weak and deconditioned, will need SNF at discharge.    2. Acute right humeral fracture.Immobilization with sling to the right upper extremity. Pain controlled with analgesics, limited mobility due to deconditioning.   3. Alcohol use. 1 to 2 drinks per night, no signs of withdrawal.  4. Hypertension/dyslipidemia.continue blood pressure control with hctz and metoprolol. On Rosuvastatin.    Status is: Inpatient  Remains inpatient appropriate because:Inpatient level of care appropriate due to severity of illness   Dispo: The patient is from: Home              Anticipated d/c is to: SNF              Anticipated d/c date is: 2 days              Patient currently is not medically stable to d/c. Pending final recommendations from surgery for discharge to SNF.    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 Right arm pain is well controlled.   Objective: Vitals:   11/15/20 0035 11/15/20 0455 11/15/20 0726 11/15/20 1114  BP: (!) 142/76 (!) 175/72 (!) 163/68 (!) 171/78  Pulse:  100 99 90  Resp: 20 20 20  13  Temp: (!) 100.5 F (38.1 C) 98.5 F (36.9 C) 98.6 F (37 C) 99 F (37.2 C)  TempSrc: Oral Oral Oral Oral  SpO2: 96% 96% 98% 96%  Weight:  71.2 kg    Height:        Intake/Output Summary (Last 24 hours) at 11/15/2020 1349 Last data filed at 11/15/2020 1052 Gross per 24 hour  Intake 972.48 ml  Output 525 ml  Net  447.48 ml   Filed Weights   11/08/20 2209 11/14/20 0424 11/15/20 0455  Weight: 67.4 kg 73.4 kg 71.2 kg    Examination:   General: Not in pain or dyspnea, deconditioned  Neurology: Awake and alert, non focal  E ENT: no pallor, no icterus, oral mucosa moist Cardiovascular: No JVD. S1-S2 present, rhythmic, no gallops, rubs, or murmurs. No lower extremity edema. Pulmonary: positive breath sounds bilaterally, with no wheezing, rhonchi or rales. Gastrointestinal. Abdomen soft and non tender/ ostomy in place.  Skin. No rashes Musculoskeletal: no joint deformities     Data Reviewed: I have personally reviewed following labs and imaging studies  CBC: Recent Labs  Lab 11/09/20 0805 11/09/20 1149 11/10/20 0142 11/11/20 0600 11/13/20 0410  WBC 3.5* 4.2 4.7 6.9 8.7  NEUTROABS  --   --   --  5.7  --   HGB 8.4* 8.2* 8.8* 8.3* 8.8*  HCT 25.3* 25.3* 26.8* 26.2* 26.6*  MCV 94.4 94.4 97.5 97.8 95.3  PLT 163 160 175 233 734   Basic Metabolic Panel: Recent Labs  Lab 11/09/20 0805 11/09/20 0805 11/10/20 0142 11/11/20 0600 11/12/20 0430 11/13/20 0410 11/14/20 0645  NA 137   < > 131* 136 139 136 137  K 2.8*   < > 5.8* 3.0* 3.2* 3.7 4.4  CL 109   < > 107 107 111 104 103  CO2 21*   < > 18* 24 23 24 25   GLUCOSE 122*   < > 567* 122* 139* 133* 118*  BUN 9   < > 9 9 8 8 9   CREATININE 0.64   < > 0.67 0.56 0.50 0.40* 0.44  CALCIUM 7.4*   < > 6.8* 7.9* 7.9* 8.2* 8.4*  MG 1.6*   < > 2.2 2.2 2.0 1.8 2.1  PHOS 1.7*  --  1.5* 1.7* 2.5 2.6  --    < > = values in this interval not displayed.   GFR: Estimated Creatinine Clearance: 61.9 mL/min (by C-G formula based on SCr of 0.44 mg/dL). Liver Function Tests: Recent Labs  Lab 11/10/20 0142 11/11/20 0600 11/12/20 0430 11/13/20 0410  AST 21 21 19  40  ALT 20 24 20  35  ALKPHOS 29* 39 49 68  BILITOT 0.6 0.3 0.7 0.6  PROT 5.0* 4.9* 4.9* 5.0*  ALBUMIN 2.4* 2.2* 2.0* 2.0*   No results for input(s): LIPASE, AMYLASE in the last 168  hours. No results for input(s): AMMONIA in the last 168 hours. Coagulation Profile: No results for input(s): INR, PROTIME in the last 168 hours. Cardiac Enzymes: No results for input(s): CKTOTAL, CKMB, CKMBINDEX, TROPONINI in the last 168 hours. BNP (last 3 results) No results for input(s): PROBNP in the last 8760 hours. HbA1C: No results for input(s): HGBA1C in the last 72 hours. CBG: Recent Labs  Lab 11/14/20 1631 11/14/20 2009 11/15/20 0044 11/15/20 0456 11/15/20 1112  GLUCAP 85 108* 101* 98 98   Lipid Profile: No results for input(s): CHOL, HDL, LDLCALC, TRIG, CHOLHDL, LDLDIRECT in the last 72 hours. Thyroid Function Tests: No results for input(s): TSH,  T4TOTAL, FREET4, T3FREE, THYROIDAB in the last 72 hours. Anemia Panel: No results for input(s): VITAMINB12, FOLATE, FERRITIN, TIBC, IRON, RETICCTPCT in the last 72 hours.    Radiology Studies: I have reviewed all of the imaging during this hospital visit personally     Scheduled Meds: . acetaminophen  1,000 mg Oral Q6H  . calcium-vitamin D  1 tablet Oral Daily  . Chlorhexidine Gluconate Cloth  6 each Topical Daily  . enoxaparin (LOVENOX) injection  40 mg Subcutaneous Daily  . feeding supplement  237 mL Oral TID BM  . fluticasone  1 spray Each Nare Daily  . folic acid  1 mg Oral QHS  . hydrochlorothiazide  12.5 mg Oral Daily  . ketorolac  15 mg Intravenous Q6H  . methocarbamol  1,000 mg Oral TID  . metoprolol tartrate  25 mg Oral BID  . multivitamin with minerals  1 tablet Oral QHS  . pantoprazole  40 mg Oral QHS  . pregabalin  150 mg Oral BID  . rosuvastatin  5 mg Oral Daily  . sodium chloride flush  10-40 mL Intracatheter Q12H  . thiamine  100 mg Oral QHS   Continuous Infusions:   LOS: 7 days        Jacqulin Brandenburger Gerome Apley, MD

## 2020-11-15 NOTE — Progress Notes (Signed)
Lab notified at 1230 that patient picc was not having blood return and that patient has CBC pending, they said they were at lunch and they will get it after.

## 2020-11-15 NOTE — Progress Notes (Signed)
Nutrition Follow-up  DOCUMENTATION CODES:   Not applicable  INTERVENTION:  Provide Ensure Enlive po TID, each supplement provides 350 kcal and 20 grams of protein.  Encourage adequate PO intake.   NUTRITION DIAGNOSIS:   Inadequate oral intake related to altered GI function as evidenced by NPO status; diet advanced; progressing  GOAL:   Patient will meet greater than or equal to 90% of their needs; progressing  MONITOR:   PO intake, Supplement acceptance, Skin, Weight trends, Labs, I & O's  REASON FOR ASSESSMENT:   Consult New TPN/TNA  ASSESSMENT:   69 yo female admitted with perforated sigmoid diverticulitis. 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 PMH includes HTN, HLD, osteopenia, chronic pain, recent R humerus fracture. 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 Meal completion has been 40-80%. Pt has been tolerating her PO diet. TPN discontinued yesterday. RD to order nutritional supplements to aid in caloric and protein needs. Pt encouraged to eat her food at meals and to drink her supplements. Educated on the importance of increased protein and caloric needs on healing.   Labs and medications reviewed.   Diet Order:   Diet Order            DIET SOFT Room service appropriate? Yes; Fluid consistency: Thin  Diet effective 1400                 EDUCATION NEEDS:   No education needs have been identified at this time  Skin:  Skin Assessment: Reviewed RN Assessment Skin Integrity Issues:: Incisions Incisions: abdomen  Last BM:  11/22 colostomy 400 ml output  Height:   Ht Readings from Last 1 Encounters:  11/08/20 5\' 3"  (1.6 m)    Weight:   Wt Readings from Last 1 Encounters:  11/15/20 71.2 kg    Ideal Body Weight:  52.3 kg  BMI:  Body mass index is 27.81 kg/m.  Estimated Nutritional Needs:   Kcal:  1650-1850  Protein:  90-110 gm  Fluid:  >/= 1.7 L  Corrin Parker, MS, RD, LDN RD pager  number/after hours weekend pager number on Amion.

## 2020-11-16 DIAGNOSIS — R198 Other specified symptoms and signs involving the digestive system and abdomen: Secondary | ICD-10-CM | POA: Diagnosis not present

## 2020-11-16 DIAGNOSIS — K219 Gastro-esophageal reflux disease without esophagitis: Secondary | ICD-10-CM | POA: Diagnosis not present

## 2020-11-16 DIAGNOSIS — I10 Essential (primary) hypertension: Secondary | ICD-10-CM | POA: Diagnosis not present

## 2020-11-16 DIAGNOSIS — K659 Peritonitis, unspecified: Secondary | ICD-10-CM | POA: Diagnosis not present

## 2020-11-16 LAB — SARS CORONAVIRUS 2 BY RT PCR (HOSPITAL ORDER, PERFORMED IN ~~LOC~~ HOSPITAL LAB): SARS Coronavirus 2: NEGATIVE

## 2020-11-16 LAB — GLUCOSE, CAPILLARY
Glucose-Capillary: 95 mg/dL (ref 70–99)
Glucose-Capillary: 103 mg/dL — ABNORMAL HIGH (ref 70–99)
Glucose-Capillary: 117 mg/dL — ABNORMAL HIGH (ref 70–99)

## 2020-11-16 MED ORDER — OXYCODONE HCL 5 MG PO TABS: 5.0000 mg | ORAL_TABLET | Freq: Four times a day (QID) | ORAL | 0 refills | Status: DC | PRN

## 2020-11-16 MED ORDER — DIPHENHYDRAMINE HCL 25 MG PO CAPS
25.0000 mg | ORAL_CAPSULE | Freq: Four times a day (QID) | ORAL | Status: DC | PRN
  Administered 2020-11-16 – 2020-11-22 (×8): 25 mg via ORAL
  Filled 2020-11-16 (×8): qty 1

## 2020-11-16 MED ORDER — ACETAMINOPHEN 500 MG PO TABS: 500.0000 mg | ORAL_TABLET | Freq: Four times a day (QID) | ORAL | 0 refills | Status: DC | PRN

## 2020-11-16 MED ORDER — ENSURE ENLIVE PO LIQD: 237.0000 mL | Freq: Three times a day (TID) | ORAL | 0 refills | Status: AC

## 2020-11-16 NOTE — Discharge Summary (Addendum)
Physician Discharge Summary  Chloe Young CHE:[id number] DOB: [birth year] DOA: 11/08/2020  PCP: Binnie Rail, MD  Admit date: 11/08/2020 Discharge date: 11/16/2020  Admitted From: Home  Disposition:  SNF  Recommendations for Outpatient Follow-up and new medication changes:  1. Follow up with Dr. Quay Burow in 7 after discharge from SNF.  2. Continue with wound vac and follow up with surgery as scheduled.  3. Follow up with [facility] in 7 days for right humeral fracture.   Home Health: na  Equipment/Devices: wound vac    Discharge Condition: stable  CODE STATUS: full  Diet recommendation: regular diet as tolerated.   Brief/Interim Summary: Chloe Young was admitted to the hospital working diagnosis of septic shock due to acute bowel perforation and peritonitis.  69 year old female with past medical history for hypertension, dyslipidemia, chronic pain syndrome, osteopenia, status post surgical laminectomy who presented with abdominal pain. She had a recent fall and was diagnosed with right arm fracture, she was placed on sling and instructed for follow up.   On the day of admission patient fell from her couch to the floor, unable to stand. She reported severe abdominal pain, with nausea and vomiting. EMS was called, they performed EKG which was suggestive of STEMI and she was transferred to the hospital. On her initial physical examination her temperature was 103.1, heart rate 130s, she was tachypneic and hypotensive. Her lungs were clear to auscultation bilaterally, heart S1/S2, present, tachycardic, the abdomen was distended, diffusely tender to palpation. Sodium 135, potassium 3.6, chloride 102, bicarb 18, glucose 157, BUN 14, creatinine 1.0, lactic acid 3.3, white count 9.0, hemoglobin 13.0, hematocrit 40.9 and plt 273.  SARS COVID-19 negative.  Urinalysis negative for infection, specific gravity 1.043. Chest radiograph, no infiltrates, right proximal humeral fracture. EKG 133 bpm,  normal axis, normal intervals, Q waves lead II, lead III and aVF, ST elevation V1-V2.  CT of the abdomen with acute bowel perforation. Suspected small bowel.  Patient was taken to the OR for sigmoid colectomy with end colostomy. She developed distributive shock with persistent postoperative hypotension.  Patient required vasopressors and close monitoring in the ICU. Supportive medical therapy with antibiotics and analgesics.  Transferred to [facility] 11/10/2020  Patient continued to have persistent post-operative ileus, required NG tube in place and nutrition per TPN.  11/20NG tube has been removed and she has been allowed to have soft diet.  Patient very weak and deconditioned, plan to transfer to SNF to continue recovery.   1. Septic shock due to peritonitis, due perforated bowel (present on admission).  After surgery patient was transferred to the intensive care unit, vasopressors were weaned off successfully.  Patient was placed on antibiotic therapy with intravenous Zosyn, continue analgesics and as needed antiemetics.  Patient developed postoperative ileus that required NG tube and TPN.  Slowly her symptoms improved, she completed full course of antibiotic therapy.  At time of discharge she is tolerating p.o. diet adequately.  She has a wound VAC in place that will need follow-up as an outpatient.   2. Acute right humeral fracture.  placed on immobilizing sling, pain control with analgesics, she will need follow-up as an outpatient.  3. Alcohol use.  No signs of alcohol withdrawal syndrome.  Had neurochecks per unit protocol.  4. HTN and dyslipidemia.  Patient regained hemodynamic stability, antihypertensive agents were resumed, hydrochlorothiazide and metoprolol.  Continue rosuvastatin.  Discharge Diagnoses:  Principal Problem:   Peritonitis [facility]) Active Problems:   Hyperlipidemia   Essential hypertension  GERD (gastroesophageal reflux disease)   Severe sepsis  (HCC)   Perforated abdominal viscus    Discharge Instructions   Allergies as of 11/16/2020   No Known Allergies     Medication List    STOP taking these medications   HYDROcodone-acetaminophen 7.5-325 MG tablet Commonly known as: NORCO   meloxicam 15 MG tablet Commonly known as: MOBIC     TAKE these medications   acetaminophen 500 MG tablet Commonly known as: TYLENOL Take 1 tablet (500 mg total) by mouth every 6 (six) hours as needed for moderate pain.   alendronate 70 MG tablet Commonly known as: FOSAMAX TAKE 1 TABLET BY MOUTH  EVERY 7 DAYS TAKE WITH A  FULL GLASS OF WATER ON AN  EMPTY STOMACH What changed:   how much to take  how to take this  when to take this  additional instructions   CALCIUM + D PO Take 1 tablet by mouth daily.   feeding supplement Liqd Take 237 mLs by mouth 3 (three) times daily between meals.   fluticasone 50 MCG/ACT nasal spray Commonly known as: FLONASE USE 1 SPRAY IN BOTH  NOSTRILS TWICE DAILY What changed: See the new instructions.   hydrochlorothiazide 12.5 MG capsule Commonly known as: MICROZIDE Take 1 capsule (12.5 mg total) by mouth daily. Annual appt due in March must see provider for future refills   Lyrica 150 MG capsule Generic drug: pregabalin Take 150 mg by mouth 2 (two) times daily.   metoprolol tartrate 50 MG tablet Commonly known as: LOPRESSOR Take 0.5 tablets (25 mg total) by mouth 2 (two) times daily. Annual appt due in March must see provider for future refills   multivitamin with minerals Tabs tablet Take 1 tablet by mouth daily.   omeprazole 20 MG capsule Commonly known as: PRILOSEC TAKE 1 CAPSULE BY MOUTH  DAILY Annual appt due in March must see provider for future refills What changed:   how much to take  how to take this  when to take this  additional instructions   oxyCODONE 5 MG immediate release tablet Commonly known as: Oxy IR/ROXICODONE Take 1 tablet (5 mg total) by mouth every 6  (six) hours as needed for severe pain.   rosuvastatin 5 MG tablet Commonly known as: CRESTOR Take 1 tablet (5 mg total) by mouth daily. Annual appt due in March must see provider for future refills   topiramate 100 MG tablet Commonly known as: TOPAMAX Take 2 tablets by mouth 2 (two) times daily.   triamcinolone 55 MCG/ACT Aero nasal inhaler Commonly known as: NASACORT Place 2 sprays into the nose daily.   triamcinolone cream 0.5 % Commonly known as: KENALOG APPLY 1 APPLICATION  TOPICALLY 2 (TWO) TIMES  DAILY AS NEEDED. What changed: reasons to take this            Durable Medical Equipment  (From admission, onward)         Start     Ordered   11/12/20 1603  For home use only DME Negative pressure wound device  Once       Question Answer Comment  Frequency of dressing change 3 times per week   Length of need 3 Months   Dressing type Foam   Amount of suction 125 mm/Hg   Pressure application Continuous pressure   Supplies 10 canisters and 15 dressings per month for duration of therapy      11/12/20 1602          Follow-up Information  Stark Klein, MD. Go on 11/17/2020.   Specialty: General Surgery Contact information: [place] [address] [phone]              No Known Allergies  Consultations:  Surgery    Procedures/Studies: DG Abdomen 1 View  Result Date: 11/08/2020 CLINICAL DATA:  NG tube placement EXAM: ABDOMEN - 1 VIEW COMPARISON:  CT earlier today. FINDINGS: NG tube is within the stomach. Free air noted under the right hemidiaphragm as seen on prior CT. IMPRESSION: NG tube in the stomach. Electronically Signed   By: Rolm Baptise M.D.   On: 11/08/2020 17:44   CT ABDOMEN PELVIS W CONTRAST  Result Date: 11/08/2020 CLINICAL DATA:  69 year old female with history of abdominal pain. Fever. EXAM: CT ABDOMEN AND PELVIS WITH CONTRAST TECHNIQUE: Multidetector CT imaging of the abdomen and pelvis was performed using  the standard protocol following bolus administration of intravenous contrast. CONTRAST:  152mL OMNIPAQUE IOHEXOL 300 MG/ML  SOLN COMPARISON:  No priors. FINDINGS: Lower chest: Subsegmental atelectasis noted in the lower lobes of the lungs bilaterally. Hepatobiliary: No suspicious cystic or solid hepatic lesions. No intra or extrahepatic biliary ductal dilatation. Gallbladder is unremarkable in appearance. Pancreas: No pancreatic mass. No pancreatic ductal dilatation. No pancreatic or peripancreatic fluid collections or inflammatory changes. Spleen: Unremarkable. Adrenals/Urinary Tract: Bilateral kidneys and adrenal glands are normal in appearance. No hydroureteronephrosis. Urinary bladder is normal in appearance. Stomach/Bowel: Stomach is unremarkable in appearance. There are numerous dilated loops of proximal to mid small bowel, with gradual transition to nondilated loops of mid to distal small bowel. The exact transition point is difficult to localize, however, in the low anatomic pelvis there are several loops of thickened small bowel which demonstrate associated mucosal hyperenhancement, with some surrounding fluid and small amount of extraluminal gas, concerning for regional enteritis affecting predominantly the mid to distal jejunum and/or proximal ileum. Terminal ileum is decompressed, but otherwise grossly unremarkable in appearance. Appendix is normal. Colon is relatively decompressed. There are numerous colonic diverticuli, particularly in the sigmoid colon. There are some mild inflammatory changes in the sigmoid colon, without definitive focal inflammatory changes to clearly indicate an acute diverticulitis at this time. Vascular/Lymphatic: Aortic atherosclerosis without evidence of aneurysm or dissection in the abdominal or pelvic vasculature. No lymphadenopathy noted in the abdomen or pelvis. Reproductive: Uterus and ovaries are unremarkable in appearance. Other: Small volume of ascites most evident in  the central small bowel mesentery adjacent to the inflamed loops of small bowel. Small amount of pneumoperitoneum, also most evident in the small bowel mesentery adjacent to the inflamed loops of small bowel, and in the upper abdomen adjacent to the liver. Musculoskeletal: There are no aggressive appearing lytic or blastic lesions noted in the visualized portions of the skeleton. IMPRESSION: 1. Findings are compatible with an acute bowel perforation. The exact site of perforation is uncertain, however, the favored candidate is the mid small bowel (likely distal jejunum or proximal ileum) where there are multiple loops of thickened and inflamed small bowel with adjacent fluid and gas in the small bowel mesentery. Associated with this there is a partial small bowel obstruction, as above. 2. There is also colonic diverticulosis. There are some subtle inflammatory changes in the sigmoid mesocolon. The possibility of an acute diverticulitis is not excluded, but not strongly favored on the basis of today's examination; however, direct inspection at time of potential laparoscopy is suggested as this could alternatively represent this site of perforation. 3. Aortic atherosclerosis. Critical  Value/emergent results were called by telephone at the time of interpretation on 11/08/2020 at 3:09 pm to provider [facility], who verbally acknowledged these results. Electronically Signed   By: Vinnie Langton M.D.   On: 11/08/2020 15:10   DG Chest Portable 1 View  Result Date: 11/08/2020 CLINICAL DATA:  Right-sided chest pain EXAM: PORTABLE CHEST 1 VIEW COMPARISON:  07/24/2011 FINDINGS: The heart size and mediastinal contours are within normal limits. Lung volumes with mild bibasilar atelectasis. No pleural effusion or pneumothorax. There is an acute fracture of the proximal right humerus with transverse component involving the surgical neck as well as a nondisplaced component involving the greater tuberosity. Fracture is mildly  displaced and angulated. Glenohumeral joint alignment appears maintained without dislocation. IMPRESSION: 1. Acute fracture of the proximal right humerus. 2. Low lung volumes with mild bibasilar atelectasis. Electronically Signed   By: Davina Poke D.O.   On: 11/08/2020 13:03   DG Shoulder Right Port  Result Date: 11/10/2020 CLINICAL DATA:  Fracture EXAM: PORTABLE RIGHT SHOULDER COMPARISON:  November 08, 2020 chest radiograph including right shoulder on frontal view FINDINGS: Frontal and oblique views were obtained. There is a comminuted fracture of the proximal humeral metaphysis with medial displacement and lateral angulation of the distal major fragment with respect to the major proximal fragment. There are fractures of the greater tuberosity with mild avulsion in this area. No gross dislocation. No appreciable joint space narrowing or erosion. IMPRESSION: Comminuted fracture proximal right humerus with involvement of the proximal humeral metaphysis and greater tuberosity. At the proximal metaphysis level, there is medial displacement and lateral angulation distally. Mild avulsion of the greater tuberosity. No gross dislocation. No appreciable joint space narrowing. Electronically Signed   By: Lowella Grip III M.D.   On: 11/10/2020 08:41   [place] EKG SITE RITE  Result Date: 11/10/2020 If Site Rite image not attached, placement could not be confirmed due to current cardiac rhythm.     Procedures: Sp sigmoid colectomy and colostomy  Subjective: Patient has been improving, no nausea or vomiting. No chest pain. Tolerating po well. Out of bed to chair.   Discharge Exam: Vitals:   11/16/20 0732 11/16/20 1152  BP: (!) 160/63 (!) 152/80  Pulse: 91 92  Resp: 13 13  Temp: 98 F (36.7 C) 97.8 F (36.6 C)  SpO2: 96% 97%   Vitals:   11/16/20 0435 11/16/20 0500 11/16/20 0732 11/16/20 1152  BP: (!) 160/77  (!) 160/63 (!) 152/80  Pulse: 84  91 92  Resp:   13 13  Temp: 98.4 F (36.9 C)   98 F (36.7 C) 97.8 F (36.6 C)  TempSrc:   Oral Oral  SpO2:   96% 97%  Weight:  71.7 kg    Height:        General: Not in pain or dyspnea, deconditioned  Neurology: Awake and alert, non focal  E ENT: no pallor, no icterus, oral mucosa moist Cardiovascular: No JVD. S1-S2 present, rhythmic, no gallops, rubs, or murmurs. No lower extremity edema. Pulmonary: positive breath sounds bilaterally, with no wheezing, rhonchi or rales. Gastrointestinal. Abdomen soft and non tender Skin. No rashes Musculoskeletal: no joint deformities   The results of significant diagnostics from this hospitalization (including imaging, microbiology, ancillary and laboratory) are listed below for reference.     Microbiology: Recent Results (from the past 240 hour(s))  Respiratory Panel by RT PCR (Flu A&B, Covid) - Nasopharyngeal Swab     Status: None   Collection Time: 11/08/20 12:36 PM  Specimen: Nasopharyngeal Swab  Result Value Ref Range Status   SARS Coronavirus 2 by RT PCR NEGATIVE NEGATIVE Final    Comment: (NOTE) SARS-CoV-2 target nucleic acids are NOT DETECTED.  The SARS-CoV-2 RNA is generally detectable in upper respiratoy specimens during the acute phase of infection. The lowest concentration of SARS-CoV-2 viral copies this assay can detect is 131 copies/mL. A negative result does not preclude SARS-Cov-2 infection and should not be used as the sole basis for treatment or other patient management decisions. A negative result may occur with  improper specimen collection/handling, submission of specimen other than nasopharyngeal swab, presence of viral mutation(s) within the areas targeted by this assay, and inadequate number of viral copies (<131 copies/mL). A negative result must be combined with clinical observations, patient history, and epidemiological information. The expected result is Negative.  Fact Sheet for Patients:  PinkCheek.be  Fact Sheet for  Healthcare Providers:  GravelBags.it  This test is no t yet approved or cleared by the [place] FDA and  has been authorized for detection and/or diagnosis of SARS-CoV-2 by FDA under an Emergency Use Authorization (EUA). This EUA will remain  in effect (meaning this test can be used) for the duration of the COVID-19 declaration under Section 564(b)(1) of the Act, 21 U.S.C. section 360bbb-3(b)(1), unless the authorization is terminated or revoked sooner.     Influenza A by PCR NEGATIVE NEGATIVE Final   Influenza B by PCR NEGATIVE NEGATIVE Final    Comment: (NOTE) The Xpert Xpress SARS-CoV-2/FLU/RSV assay is intended as an aid in  the diagnosis of influenza from Nasopharyngeal swab specimens and  should not be used as a sole basis for treatment. Nasal washings and  aspirates are unacceptable for Xpert Xpress SARS-CoV-2/FLU/RSV  testing.  Fact Sheet for Patients: PinkCheek.be  Fact Sheet for Healthcare Providers: GravelBags.it  This test is not yet approved or cleared by the [place] FDA and  has been authorized for detection and/or diagnosis of SARS-CoV-2 by  FDA under an Emergency Use Authorization (EUA). This EUA will remain  in effect (meaning this test can be used) for the duration of the  Covid-19 declaration under Section 564(b)(1) of the Act, 21  U.S.C. section 360bbb-3(b)(1), unless the authorization is  terminated or revoked. Performed at [facility], [place] [address], [place], [place] [address]   Blood Culture (routine x 2)     Status: None   Collection Time: 11/08/20 12:40 PM   Specimen: BLOOD LEFT FOREARM  Result Value Ref Range Status   Specimen Description BLOOD LEFT FOREARM  Final   Special Requests   Final    BOTTLES DRAWN AEROBIC AND ANAEROBIC Blood Culture results may not be optimal due to an inadequate volume of blood received in culture bottles   Culture    Final    NO GROWTH 5 DAYS Performed at [facility], [place] [address] [place], [place] [address]    Report Status 11/13/2020 FINAL  Final  MRSA PCR Screening     Status: None   Collection Time: 11/08/20  9:54 PM   Specimen: Nasopharyngeal  Result Value Ref Range Status   MRSA by PCR NEGATIVE NEGATIVE Final    Comment:        The GeneXpert MRSA Assay (FDA approved for NASAL specimens only), is one component of a comprehensive MRSA colonization surveillance program. It is not intended to diagnose MRSA infection nor to guide or monitor treatment for MRSA infections. Performed at [facility],  [address], [place], [place] [address]   Blood Culture (routine x 2)     Status: None   Collection Time: 11/08/20 10:46 PM   Specimen: BLOOD  Result Value Ref Range Status   Specimen Description BLOOD SITE NOT SPECIFIED  Final   Special Requests   Final    BOTTLES DRAWN AEROBIC ONLY Blood Culture adequate volume   Culture   Final    NO GROWTH 5 DAYS Performed at [facility], [place] [address] [place], [place] [address]    Report Status 11/13/2020 FINAL  Final  Urine culture     Status: None   Collection Time: 11/09/20  1:28 PM   Specimen: In/Out Cath Urine  Result Value Ref Range Status   Specimen Description IN/OUT CATH URINE  Final   Special Requests NONE  Final   Culture   Final    NO GROWTH Performed at [facility], [place] [address], [place], [place] [address]    Report Status 11/10/2020 FINAL  Final     Labs: BNP (last 3 results) No results for input(s): BNP in the last 8760 hours. Basic Metabolic Panel: Recent Labs  Lab 11/10/20 0142 11/11/20 0600 11/12/20 0430 11/13/20 0410 11/14/20 0645  NA 131* 136 139 136 137  K 5.8* 3.0* 3.2* 3.7 4.4  CL 107 107 111 104 103  CO2 18* 24 23 24 25   GLUCOSE 567* 122* 139* 133* 118*  BUN 9 9 8 8 9   CREATININE 0.67 0.56 0.50 0.40* 0.44  CALCIUM 6.8* 7.9* 7.9* 8.2* 8.4*  MG 2.2 2.2 2.0 1.8 2.1  PHOS  1.5* 1.7* 2.5 2.6  --    Liver Function Tests: Recent Labs  Lab 11/10/20 0142 11/11/20 0600 11/12/20 0430 11/13/20 0410  AST 21 21 19  40  ALT 20 24 20  35  ALKPHOS 29* 39 49 68  BILITOT 0.6 0.3 0.7 0.6  PROT 5.0* 4.9* 4.9* 5.0*  ALBUMIN 2.4* 2.2* 2.0* 2.0*   No results for input(s): LIPASE, AMYLASE in the last 168 hours. No results for input(s): AMMONIA in the last 168 hours. CBC: Recent Labs  Lab 11/10/20 0142 11/11/20 0600 11/13/20 0410 11/15/20 1543  WBC 4.7 6.9 8.7 10.3  NEUTROABS  --  5.7  --   --   HGB 8.8* 8.3* 8.8* 9.6*  HCT 26.8* 26.2* 26.6* 29.3*  MCV 97.5 97.8 95.3 93.6  PLT 175 233 277 453*   Cardiac Enzymes: No results for input(s): CKTOTAL, CKMB, CKMBINDEX, TROPONINI in the last 168 hours. BNP: Invalid input(s): POCBNP CBG: Recent Labs  Lab 11/15/20 0044 11/15/20 0456 11/15/20 1112 11/15/20 1604 11/16/20 0835  GLUCAP 101* 98 98 97 95   D-Dimer No results for input(s): DDIMER in the last 72 hours. Hgb A1c No results for input(s): HGBA1C in the last 72 hours. Lipid Profile No results for input(s): CHOL, HDL, LDLCALC, TRIG, CHOLHDL, LDLDIRECT in the last 72 hours. Thyroid function studies No results for input(s): TSH, T4TOTAL, T3FREE, THYROIDAB in the last 72 hours.  Invalid input(s): FREET3 Anemia work up No results for input(s): VITAMINB12, FOLATE, FERRITIN, TIBC, IRON, RETICCTPCT in the last 72 hours. Urinalysis    Component Value Date/Time   COLORURINE YELLOW 11/08/2020 2257   APPEARANCEUR HAZY (A) 11/08/2020 2257   LABSPEC 1.043 (H) 11/08/2020 2257   PHURINE 5.0 11/08/2020 2257   GLUCOSEU NEGATIVE 11/08/2020 2257   HGBUR SMALL (A) 11/08/2020 2257   BILIRUBINUR NEGATIVE 11/08/2020 [place] 11/08/2020 2257  PROTEINUR 100 (A) 11/08/2020 2257   UROBILINOGEN 0.2 10/19/2014 1442   NITRITE NEGATIVE 11/08/2020 2257   LEUKOCYTESUR NEGATIVE 11/08/2020 2257   Sepsis Labs Invalid input(s): PROCALCITONIN,  WBC,   LACTICIDVEN Microbiology Recent Results (from the past 240 hour(s))  Respiratory Panel by RT PCR (Flu A&B, Covid) - Nasopharyngeal Swab     Status: None   Collection Time: 11/08/20 12:36 PM   Specimen: Nasopharyngeal Swab  Result Value Ref Range Status   SARS Coronavirus 2 by RT PCR NEGATIVE NEGATIVE Final    Comment: (NOTE) SARS-CoV-2 target nucleic acids are NOT DETECTED.  The SARS-CoV-2 RNA is generally detectable in upper respiratoy specimens during the acute phase of infection. The lowest concentration of SARS-CoV-2 viral copies this assay can detect is 131 copies/mL. A negative result does not preclude SARS-Cov-2 infection and should not be used as the sole basis for treatment or other patient management decisions. A negative result may occur with  improper specimen collection/handling, submission of specimen other than nasopharyngeal swab, presence of viral mutation(s) within the areas targeted by this assay, and inadequate number of viral copies (<131 copies/mL). A negative result must be combined with clinical observations, patient history, and epidemiological information. The expected result is Negative.  Fact Sheet for Patients:  PinkCheek.be  Fact Sheet for Healthcare Providers:  GravelBags.it  This test is no t yet approved or cleared by the [place] FDA and  has been authorized for detection and/or diagnosis of SARS-CoV-2 by FDA under an Emergency Use Authorization (EUA). This EUA will remain  in effect (meaning this test can be used) for the duration of the COVID-19 declaration under Section 564(b)(1) of the Act, 21 U.S.C. section 360bbb-3(b)(1), unless the authorization is terminated or revoked sooner.     Influenza A by PCR NEGATIVE NEGATIVE Final   Influenza B by PCR NEGATIVE NEGATIVE Final    Comment: (NOTE) The Xpert Xpress SARS-CoV-2/FLU/RSV assay is intended as an aid in  the diagnosis of  influenza from Nasopharyngeal swab specimens and  should not be used as a sole basis for treatment. Nasal washings and  aspirates are unacceptable for Xpert Xpress SARS-CoV-2/FLU/RSV  testing.  Fact Sheet for Patients: PinkCheek.be  Fact Sheet for Healthcare Providers: GravelBags.it  This test is not yet approved or cleared by the [place] FDA and  has been authorized for detection and/or diagnosis of SARS-CoV-2 by  FDA under an Emergency Use Authorization (EUA). This EUA will remain  in effect (meaning this test can be used) for the duration of the  Covid-19 declaration under Section 564(b)(1) of the Act, 21  U.S.C. section 360bbb-3(b)(1), unless the authorization is  terminated or revoked. Performed at [facility], [place] [address] [place], [place] [address]   Blood Culture (routine x 2)     Status: None   Collection Time: 11/08/20 12:40 PM   Specimen: BLOOD LEFT FOREARM  Result Value Ref Range Status   Specimen Description BLOOD LEFT FOREARM  Final   Special Requests   Final    BOTTLES DRAWN AEROBIC AND ANAEROBIC Blood Culture results may not be optimal due to an inadequate volume of blood received in culture bottles   Culture   Final    NO GROWTH 5 DAYS Performed at [facility], [place] [address] [place], [place] [address]    Report Status 11/13/2020 FINAL  Final  MRSA PCR Screening     Status: None   Collection Time: 11/08/20  9:54 PM   Specimen:  Nasopharyngeal  Result Value Ref Range Status   MRSA by PCR NEGATIVE NEGATIVE Final    Comment:        The GeneXpert MRSA Assay (FDA approved for NASAL specimens only), is one component of a comprehensive MRSA colonization surveillance program. It is not intended to diagnose MRSA infection nor to guide or monitor treatment for MRSA infections. Performed at [facility], [place] [address] [place], [place] [address]   Blood Culture (routine x 2)      Status: None   Collection Time: 11/08/20 10:46 PM   Specimen: BLOOD  Result Value Ref Range Status   Specimen Description BLOOD SITE NOT SPECIFIED  Final   Special Requests   Final    BOTTLES DRAWN AEROBIC ONLY Blood Culture adequate volume   Culture   Final    NO GROWTH 5 DAYS Performed at [facility], [place] [address], [place], [place] [address]    Report Status 11/13/2020 FINAL  Final  Urine culture     Status: None   Collection Time: 11/09/20  1:28 PM   Specimen: In/Out Cath Urine  Result Value Ref Range Status   Specimen Description IN/OUT CATH URINE  Final   Special Requests NONE  Final   Culture   Final    NO GROWTH Performed at [facility], [place] [address], [place], [place] [address]    Report Status 11/10/2020 FINAL  Final     Time coordinating discharge: 45 minutes  SIGNED:   Tawni Millers, MD  [facility] 11/16/2020, 2:49 PM

## 2020-11-16 NOTE — Progress Notes (Signed)
Progress Note  8 Days Post-Op  Subjective: No acute changes. Pain well controlled.   Objective: Vital signs in last 24 hours: Temp:  [98 F (36.7 C)-99.1 F (37.3 C)] 98 F (36.7 C) (11/23 0732) Pulse Rate:  [84-115] 91 (11/23 0732) Resp:  [13-20] 13 (11/23 0732) BP: (139-171)/(42-88) 160/63 (11/23 0732) SpO2:  [92 %-98 %] 96 % (11/23 0732) Weight:  [71.7 kg] 71.7 kg (11/23 0500) Last BM Date: 11/15/20  Intake/Output from previous day: 11/22 0701 - 11/23 0700 In: 740 [P.O.:740] Out: 900 [Urine:450; Stool:450] Intake/Output this shift: Total I/O In: -  Out: 200 [Stool:200]  PE: General: pleasant, WD,WNfemale Heart:RRR. Palpable radial and pedal pulses bilaterally Lungs: CTAB, no wheezes, rhonchi, or rales noted. Respiratory effort nonlabored Abd: soft,appropriately ttp, not distended, midline woundwith VAC present, stomaviable with stool in ostomy appliance MS:RUE in shoulder sling    Lab Results:  Recent Labs    11/15/20 1543  WBC 10.3  HGB 9.6*  HCT 29.3*  PLT 453*   BMET Recent Labs    11/14/20 0645  NA 137  K 4.4  CL 103  CO2 25  GLUCOSE 118*  BUN 9  CREATININE 0.44  CALCIUM 8.4*   PT/INR No results for input(s): LABPROT, INR in the last 72 hours. CMP     Component Value Date/Time   NA 137 11/14/2020 0645   K 4.4 11/14/2020 0645   CL 103 11/14/2020 0645   CO2 25 11/14/2020 0645   GLUCOSE 118 (H) 11/14/2020 0645   BUN 9 11/14/2020 0645   CREATININE 0.44 11/14/2020 0645   CALCIUM 8.4 (L) 11/14/2020 0645   PROT 5.0 (L) 11/13/2020 0410   ALBUMIN 2.0 (L) 11/13/2020 0410   AST 40 11/13/2020 0410   ALT 35 11/13/2020 0410   ALKPHOS 68 11/13/2020 0410   BILITOT 0.6 11/13/2020 0410   GFRNONAA >60 11/14/2020 0645   GFRAA >90 01/15/2012 0344   Lipase     Component Value Date/Time   LIPASE 25 11/08/2020 1236       Studies/Results: No results found.  Anti-infectives: Anti-infectives (From admission, onward)   Start      Dose/Rate Route Frequency Ordered Stop   11/09/20 2330  piperacillin-tazobactam (ZOSYN) IVPB 3.375 g       "Followed by" Linked Group Details   3.375 g 12.5 mL/hr over 240 Minutes Intravenous Every 8 hours 11/08/20 1704 11/14/20 2019   11/08/20 2200  ceFEPIme (MAXIPIME) 2 g in sodium chloride 0.9 % 100 mL IVPB  Status:  Discontinued        2 g 200 mL/hr over 30 Minutes Intravenous Every 12 hours 11/08/20 1335 11/08/20 1638   11/08/20 1830  piperacillin-tazobactam (ZOSYN) IVPB 3.375 g        3.375 g 12.5 mL/hr over 240 Minutes Intravenous To Surgery 11/08/20 1823 11/09/20 1830   11/08/20 1715  piperacillin-tazobactam (ZOSYN) IVPB 3.375 g       "Followed by" Linked Group Details   3.375 g 100 mL/hr over 30 Minutes Intravenous  Once 11/08/20 1704 11/13/20 1030   11/08/20 1245  ceFEPIme (MAXIPIME) 2 g in sodium chloride 0.9 % 100 mL IVPB        2 g 200 mL/hr over 30 Minutes Intravenous  Once 11/08/20 1243 11/08/20 1338       Assessment/Plan HTN HLD Chronic pain Recent hx of R humerus fracture -Dr. 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 Barry Dienes - POD#8 -VAC M/W/F -  good bowel function  - Antibiotics stopped 11/21, WBC 10 yesterday and afeb last 24h  FEN: reg diet  VTE: lovenox ID: cefepime 11/15; Zosyn 11/15>11/21  LOS: 8 days    Norm Parcel , [facility] Surgery 11/16/2020, 9:02 AM Please see Amion for pager number during day hours 7:00am-4:30pm

## 2020-11-16 NOTE — Progress Notes (Signed)
Physical Therapy Treatment Patient Details Name: RAFEEF Young MRN: [id number] DOB: 1951-12-08 Today's Date: 11/16/2020    History of Present Illness Pt adm 11/15 with sepsis due to acute bowel perforation. Had 2 recent falls at home and has rt humerus fracture due to fall. 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     PT Comments    Pt progressing well with mobility, ambulatory to and from bathroom and in room with [facility] this day. Pt requires light steadying assist, especially during transitional movements, but is improving. PT encouraged pt to perform LE exercises (LAQs, seated marches) to tolerance while up in chair, pt able to complete HEP independently. PT to continue to follow acutely.     Follow Up Recommendations  SNF     Equipment Recommendations  None recommended by PT    Recommendations for Other Services       Precautions / Restrictions Precautions Precautions: Fall;Shoulder Shoulder Interventions: Shoulder sling/immobilizer Precaution Comments: Per Dr. Veverly Young' note 11/17, pt to keep sling on at all times with no ROM Rt shoulder, arm across body  Required Braces or Orthoses: Sling Restrictions Weight Bearing Restrictions: No    Mobility  Bed Mobility Overal bed mobility: Needs Assistance Bed Mobility: Supine to Sit     Supine to sit: Min guard;HOB elevated     General bed mobility comments: min guard for safety, lines/leads assist  Transfers Overall transfer level: Needs assistance Equipment used: Straight cane Transfers: Sit to/from Stand Sit to Stand: Min assist;Min guard         General transfer comment: Min assist initially for power up, on second stand from toilet with HHA pt requiring min guard for safety only.  Ambulation/Gait Ambulation/Gait assistance: Min guard Gait Distance (Feet): 30 Feet (+10) Assistive device: Straight cane Gait  Pattern/deviations: Step-through pattern;Decreased stride length;Trunk flexed Gait velocity: decr   General Gait Details: min guard for safety, verbal cuing for sequencing cane, upright posture.   Stairs             Wheelchair Mobility    Modified Rankin (Stroke Patients Only)       Balance Overall balance assessment: Needs assistance Sitting-balance support: No upper extremity supported Sitting balance-Leahy Scale: Fair Sitting balance - Comments: No UE support    Standing balance support: Single extremity supported Standing balance-Leahy Scale: Poor Standing balance comment: requires UE support                             Cognition Arousal/Alertness: Awake/alert Behavior During Therapy: WFL for tasks assessed/performed Overall Cognitive Status: Within Functional Limits for tasks assessed                                 General Comments: alert and oriented, able to follow all instructions      Exercises      General Comments        Pertinent Vitals/Pain Pain Assessment: Faces Faces Pain Scale: Hurts a little bit Pain Location: abdomen Pain Descriptors / Indicators: Sore;Guarding Pain Intervention(s): Limited activity within patient's tolerance;Monitored during session;Repositioned    Home Living                      Prior Function            PT Goals (  current goals can now be found in the care plan section) Acute Rehab PT Goals Patient Stated Goal: be independent again PT Goal Formulation: With patient Time For Goal Achievement: 11/23/20 Potential to Achieve Goals: Good Progress towards PT goals: Progressing toward goals    Frequency    Min 3X/week      PT Plan Current plan remains appropriate    Co-evaluation              AM-PAC PT "6 Clicks" Mobility   Outcome Measure  Help needed turning from your back to your side while in a flat bed without using bedrails?: A Little Help needed moving  from lying on your back to sitting on the side of a flat bed without using bedrails?: A Little Help needed moving to and from a bed to a chair (including a wheelchair)?: A Little Help needed standing up from a chair using your arms (e.g., wheelchair or bedside chair)?: A Little Help needed to walk in hospital room?: A Little Help needed climbing 3-5 steps with a railing? : A Little 6 Click Score: 18    End of Session   Activity Tolerance: Patient tolerated treatment well Patient left: with call bell/phone within reach;in chair;Other (comment) (with OT in room, back to back PT/OT) Nurse Communication: Mobility status;Patient requests pain meds PT Visit Diagnosis: Other abnormalities of gait and mobility (R26.89);Muscle weakness (generalized) (M62.81);History of falling (Z91.81)     Time: 8366-2947 PT Time Calculation (min) (ACUTE ONLY): 22 min  Charges:  $Gait Training: 8-22 mins                     Chloe Young E, PT Acute Rehabilitation Services Pager [phone]  Office [phone]     Chloe Young D Chloe Young 11/16/2020, 4:09 PM

## 2020-11-16 NOTE — TOC Progression Note (Signed)
Transition of Care [facility]) - Progression Note    Patient Details  Name: Chloe Young MRN: [id number] Date of Birth: [birth year]  Transition of Care [facility]) CM/SW [place], [place] Phone Number: 11/16/2020, 5:31 PM  Clinical Narrative:     covid remains pending- Received insurance [employer] # [id number] from 11/23-11/29. SNF has confirmed they will have wound vac tomorrow. Patient will be ready for discharge to [facility] tomorrow, if deemed medically stable.   Thurmond Butts, MSW, [place] Clinical Social Worker   Expected Discharge Plan: Skilled Nursing Facility Barriers to Discharge: Continued Medical Work up  Expected Discharge Plan and Services Expected Discharge Plan: [place] In-house Referral: Clinical Social Work                                             Social Determinants of Health (SDOH) Interventions    Readmission Risk Interventions No flowsheet data found.

## 2020-11-16 NOTE — TOC Progression Note (Signed)
Transition of Care [facility]) - Progression Note    Patient Details  Name: QUANTISHA MARSICANO MRN: [id number] Date of Birth: Aug 23, 1951  Transition of Care [facility]) CM/SW [place], [place] Phone Number: 11/16/2020, 10:48 AM  Clinical Narrative:     CSW spoke with the patient's son, Randall Hiss, he has selected [facility]- [facility] confirmed bed offer. CSW informed SNF, wound Vac needed.   CSW started insurance auth today CSW will continue to follow and assist with discharge planning.   Thurmond Butts, MSW, [place] Clinical Social Worker  Expected Discharge Plan: Skilled Nursing Facility Barriers to Discharge: Continued Medical Work up  Expected Discharge Plan and Services Expected Discharge Plan: [place] In-house Referral: Clinical Social Work                                             Social Determinants of Health (SDOH) Interventions    Readmission Risk Interventions No flowsheet data found.

## 2020-11-16 NOTE — Progress Notes (Signed)
Occupational Therapy Treatment Patient Details Name: Chloe Young MRN: [id number] DOB: 27-Jun-1951 Today's Date: 11/16/2020    History of present illness Pt adm 11/15 with sepsis due to acute bowel perforation. Had 2 recent falls at home and has rt humerus fracture due to fall. 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    OT comments  Pt progressing towards OT goals, presents seated in recliner pleasant and willing to participate in session. Pt performing functional mobility tasks with minguard-minA throughout. She continues to require up to maxA for LB ADL given pain, difficulty reaching LEs, and given RUE impairments. Feel SNF recommendation remains appropriate at this time. Will continue to follow while acutely admitted.   Follow Up Recommendations  SNF    Equipment Recommendations  None recommended by OT          Precautions / Restrictions Precautions Precautions: Fall;Shoulder Shoulder Interventions: Shoulder sling/immobilizer Precaution Comments: Per Dr. Veverly Fells' note 11/17, pt to keep sling on at all times with no ROM Rt shoulder, arm across body  Required Braces or Orthoses: Sling Restrictions Weight Bearing Restrictions: No       Mobility Bed Mobility Overal bed mobility: Needs Assistance Bed Mobility: Supine to Sit     Supine to sit: Min guard;HOB elevated     General bed mobility comments: OOB in recliner   Transfers Overall transfer level: Needs assistance Equipment used: Straight cane Transfers: Sit to/from Stand Sit to Stand: Min assist;Min guard         General transfer comment: minA initially progressed to minguard; x3 sit<>stand from recliner     Balance Overall balance assessment: Needs assistance Sitting-balance support: No upper extremity supported Sitting balance-Leahy Scale: Fair Sitting balance - Comments: No UE support    Standing balance  support: Single extremity supported Standing balance-Leahy Scale: Poor Standing balance comment: requires UE support                            ADL either performed or assessed with clinical judgement   ADL Overall ADL's : Needs assistance/impaired     Grooming: Set up;Sitting               Lower Body Dressing: Maximal assistance;Sit to/from stand Lower Body Dressing Details (indicate cue type and reason): pt only able to achieve partial figure 4 - unable to reach to doff socks requiring assist              Functional mobility during ADLs: Minimal assistance;Cane       Cognition Arousal/Alertness: Awake/alert Behavior During Therapy: WFL for tasks assessed/performed Overall Cognitive Status: Within Functional Limits for tasks assessed                                 General Comments: alert and oriented, able to follow all instructions        Exercises Exercises: General Upper Extremity General Exercises - Upper Extremity Wrist Flexion: AROM;Right;10 reps;Seated Wrist Extension: AROM;10 reps;Seated;Right Digit Composite Flexion: AROM;Right;10 reps;Seated Composite Extension: AROM;Right;10 reps;Seated   Shoulder Instructions       General Comments      Pertinent Vitals/ Pain       Pain Assessment: Faces Faces Pain Scale: Hurts a little bit Pain Location: abdomen Pain Descriptors / Indicators: Sore;Guarding Pain Intervention(s): Monitored during session;Repositioned  Home  Living                                          Prior Functioning/Environment              Frequency  Min 2X/week        Progress Toward Goals  OT Goals(current goals can now be found in the care plan section)  Progress towards OT goals: Progressing toward goals  Acute Rehab OT Goals Patient Stated Goal: be independent again OT Goal Formulation: With patient Time For Goal Achievement: 11/26/20 Potential to Achieve Goals:  Good ADL Goals Pt Will Perform Eating: with modified independence;with adaptive utensils Pt Will Perform Grooming: with min guard assist;standing Pt Will Perform Upper Body Bathing: with min assist;with adaptive equipment;sitting Pt Will Perform Lower Body Bathing: with min assist;with adaptive equipment;sit to/from stand Pt Will Perform Lower Body Dressing: with min assist;sit to/from stand;with adaptive equipment Pt Will Transfer to Toilet: with min guard assist;ambulating;regular height toilet;grab bars Pt Will Perform Toileting - Clothing Manipulation and hygiene: with min guard assist;sit to/from stand;with adaptive equipment  Plan Discharge plan remains appropriate    Co-evaluation                 AM-PAC OT "6 Clicks" Daily Activity     Outcome Measure   Help from another person eating meals?: A Little Help from another person taking care of personal grooming?: A Little Help from another person toileting, which includes using toliet, bedpan, or urinal?: A Lot Help from another person bathing (including washing, rinsing, drying)?: A Lot Help from another person to put on and taking off regular upper body clothing?: A Lot Help from another person to put on and taking off regular lower body clothing?: Total 6 Click Score: 13    End of Session Equipment Utilized During Treatment: Other (comment) (cane, sling)  OT Visit Diagnosis: Unsteadiness on feet (R26.81);Pain Pain - part of body:  (abdomen)   Activity Tolerance Patient tolerated treatment well   Patient Left in chair;with call bell/phone within reach;with chair alarm set   Nurse Communication Mobility status        Time: 6734-1937 OT Time Calculation (min): 14 min  Charges: OT General Charges $OT Visit: 1 Visit OT Treatments $Therapeutic Activity: 8-22 mins  Lou Cal, OT Acute Rehabilitation Services Pager [phone] Office [place] 11/16/2020, 4:55 PM

## 2020-11-17 ENCOUNTER — Inpatient Hospital Stay (HOSPITAL_COMMUNITY): Payer: Medicare Other

## 2020-11-17 DIAGNOSIS — K659 Peritonitis, unspecified: Secondary | ICD-10-CM | POA: Diagnosis not present

## 2020-11-17 DIAGNOSIS — A419 Sepsis, unspecified organism: Secondary | ICD-10-CM | POA: Diagnosis not present

## 2020-11-17 DIAGNOSIS — I10 Essential (primary) hypertension: Secondary | ICD-10-CM | POA: Diagnosis not present

## 2020-11-17 DIAGNOSIS — R198 Other specified symptoms and signs involving the digestive system and abdomen: Secondary | ICD-10-CM | POA: Diagnosis not present

## 2020-11-17 DIAGNOSIS — K56600 Partial intestinal obstruction, unspecified as to cause: Secondary | ICD-10-CM

## 2020-11-17 LAB — BASIC METABOLIC PANEL
Anion gap: 13 (ref 5–15)
BUN: 8 mg/dL (ref 8–23)
CO2: 24 mmol/L (ref 22–32)
Calcium: 8.8 mg/dL — ABNORMAL LOW (ref 8.9–10.3)
Chloride: 97 mmol/L — ABNORMAL LOW (ref 98–111)
Creatinine, Ser: 0.53 mg/dL (ref 0.44–1.00)
GFR, Estimated: >60 mL/min (ref >60)
Glucose, Bld: 121 mg/dL — ABNORMAL HIGH (ref 70–99)
Potassium: 3.9 mmol/L (ref 3.5–5.1)
Sodium: 134 mmol/L — ABNORMAL LOW (ref 135–145)

## 2020-11-17 LAB — CBC
HCT: 26.2 % — ABNORMAL LOW (ref 36.0–46.0)
Hemoglobin: 8.5 g/dL — ABNORMAL LOW (ref 12.0–15.0)
MCH: 30.6 pg (ref 26.0–34.0)
MCHC: 32.4 g/dL (ref 30.0–36.0)
MCV: 94.2 fL (ref 80.0–100.0)
Platelets: 514 10*3/uL — ABNORMAL HIGH (ref 150–400)
RBC: 2.78 MIL/uL — ABNORMAL LOW (ref 3.87–5.11)
RDW: 13.5 % (ref 11.5–15.5)
WBC: 12.3 10*3/uL — ABNORMAL HIGH (ref 4.0–10.5)
nRBC: 0 % (ref 0.0–0.2)

## 2020-11-17 MED ORDER — IOHEXOL 300 MG/ML  SOLN
100.0000 mL | Freq: Once | INTRAMUSCULAR | Status: AC | PRN
  Administered 2020-11-17: 100 mL via INTRAVENOUS

## 2020-11-17 NOTE — Progress Notes (Signed)
IV was started and CT notified.

## 2020-11-17 NOTE — Progress Notes (Signed)
Patient off the unit for CT.

## 2020-11-17 NOTE — Progress Notes (Signed)
Patient off the unit for CT scan all PO contrast was given.

## 2020-11-17 NOTE — Progress Notes (Addendum)
[facility] PROGRESS NOTE    Progress Note  Chloe Young  XBD:[id number] DOB: [birth year] DOA: 11/08/2020 PCP: Binnie Rail, MD     Brief Narrative:   Chloe Young is an 69 y.o. 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  The patient developed postoperative ileus required NG tube and TPN, ileus resolved NG tube removed on 11/13/2020 was allowed a soft diet.  Patient is very weak and deconditioning physical therapy evaluated him and recommended transfer to skilled nursing facility.  Assessment/Plan:   Septic shock due to peritonitis in the setting of bowel perforation: Admitted under surgery status post laparoscopy with colostomy and colectomy.  Treated with IV antibiotics and vasopressors for which she has completed his treatment of antibiotics. Patient subsequently developed postoperative ileus and required NG tube and TPN now his symptoms have improved ileus has resolved having regular bowel movements. Wound VAC with purulent material, surgery recommended a CT scan of the abdomen and pelvis as she at high risk of developing infections and or abscess.  Acute right humeral fracture: In a sling continue pain medication.  Alcohol use: No signs of withdrawal.  Essential hypertension and  hyperlipidemia: Continue current regimen no changes made.   DVT prophylaxis: lovenxo Family Communication:none Status is: Inpatient  Remains inpatient appropriate because:Hemodynamically unstable   Dispo: The patient is from: Home              Anticipated d/c is to: SNF              Anticipated d/c date is: 1 day              Patient currently is medically stable to d/c.        Code Status:     Code Status Orders  (From admission, onward)         Start     Ordered   11/08/20 1633  Full code  Continuous        11/08/20 1634        Code Status History    This patient has a current code status but no historical code status.   Advance Care Planning Activity        IV Access:    Peripheral IV   Procedures and diagnostic studies:   No results found.   Medical Consultants:    None.  Anti-Infectives:   none  Subjective:    Chloe Young she relates her pain is controlled has no new complaints today.  Objective:    Vitals:   11/16/20 2017 11/17/20 0019 11/17/20 0437 11/17/20 0500  BP: (!) 163/74 (!) 155/70 (!) 152/66   Pulse: (!) 114 92 (!) 102   Resp:      Temp: 100 F (37.8 C) 98.9 F (37.2 C) 99.2 F (37.3 C)   TempSrc:      SpO2:  Weight:    71.3 kg  Height:       SpO2: 95 % O2 Flow Rate (L/min): 1 L/min   Intake/Output Summary (Last 24 hours) at 11/17/2020 0728 Last data filed at 11/17/2020 0600 Gross per 24 hour  Intake 500 ml  Output 400 ml  Net 100 ml   Filed Weights   11/15/20 0455 11/16/20 0500 11/17/20 0500  Weight: 71.2 kg 71.7 kg 71.3 kg    Exam: General exam: In no acute distress. Respiratory system: Good air movement and clear to auscultation. Cardiovascular system: S1 & S2 heard, RRR. No JVD. Gastrointestinal system: Positive bowel sounds soft nontender nondistended. Extremities: No pedal edema. Skin: No rashes, lesions or ulcers Psychiatry: Judgement and insight appear normal. Mood & affect  appropriate.    Data Reviewed:    Labs: Basic Metabolic Panel: Recent Labs  Lab 11/11/20 0600 11/11/20 0600 11/12/20 0430 11/12/20 0430 11/13/20 0410 11/14/20 0645  NA 136  --  139  --  136 137  K 3.0*   < > 3.2*   < > 3.7 4.4  CL 107  --  111  --  104 103  CO2 24  --  23  --  24 25  GLUCOSE 122*  --  139*  --  133* 118*  BUN 9  --  8  --  8 9  CREATININE 0.56  --  0.50  --  0.40* 0.44  CALCIUM 7.9*  --  7.9*  --  8.2* 8.4*  MG 2.2  --  2.0  --  1.8 2.1  PHOS 1.7*  --  2.5  --  2.6  --    < > = values in this interval not displayed.   GFR Estimated Creatinine Clearance: 62 mL/min (by C-G formula based on SCr of 0.44 mg/dL). Liver Function Tests: Recent Labs  Lab 11/11/20 0600 11/12/20 0430 11/13/20 0410  AST 21 19 40  ALT 24 20 35  ALKPHOS 39 49 68  BILITOT 0.3 0.7 0.6  PROT 4.9* 4.9* 5.0*  ALBUMIN 2.2* 2.0* 2.0*   No results for input(s): LIPASE, AMYLASE in the last 168 hours. No results for input(s): AMMONIA in the last 168 hours. Coagulation profile No results for input(s): INR, PROTIME in the last 168 hours. COVID-19 Labs  No results for input(s): DDIMER, FERRITIN, LDH, CRP in the last 72 hours.  Lab Results  Component Value Date   SARSCOV2NAA NEGATIVE 11/16/2020   [place] NEGATIVE 11/08/2020    CBC: Recent Labs  Lab 11/11/20 0600 11/13/20 0410 11/15/20 1543  WBC 6.9 8.7 10.3  NEUTROABS 5.7  --   --   HGB 8.3* 8.8* 9.6*  HCT 26.2* 26.6* 29.3*  MCV 97.8 95.3 93.6  PLT 233 277 453*   Cardiac Enzymes: No results for input(s): CKTOTAL, CKMB, CKMBINDEX, TROPONINI in the last 168 hours. BNP (last 3 results) No results for input(s): PROBNP in the last 8760 hours. CBG: Recent Labs  Lab 11/15/20 1112 11/15/20 1604 11/16/20 0835 11/16/20 1151 11/16/20 1621  GLUCAP 98 97 95 103* 117*   D-Dimer: No results for input(s): DDIMER in the last 72 hours. Hgb A1c: No results for input(s): HGBA1C in the last 72 hours. Lipid Profile: No  results for input(s): CHOL, HDL, LDLCALC, TRIG, CHOLHDL, LDLDIRECT in the last 72 hours. Thyroid function studies: No results for input(s): TSH, T4TOTAL, T3FREE, THYROIDAB in the last 72 hours.  Invalid input(s): FREET3 Anemia work up: No results for input(s): VITAMINB12, FOLATE, FERRITIN, TIBC, IRON,  RETICCTPCT in the last 72 hours. Sepsis Labs: Recent Labs  Lab 11/11/20 0600 11/13/20 0410 11/15/20 1543  WBC 6.9 8.7 10.3   Microbiology Recent Results (from the past 240 hour(s))  Respiratory Panel by RT PCR (Flu A&B, Covid) - Nasopharyngeal Swab     Status: None   Collection Time: 11/08/20 12:36 PM   Specimen: Nasopharyngeal Swab  Result Value Ref Range Status   SARS Coronavirus 2 by RT PCR NEGATIVE NEGATIVE Final    Comment: (NOTE) SARS-CoV-2 target nucleic acids are NOT DETECTED.  The SARS-CoV-2 RNA is generally detectable in upper respiratoy specimens during the acute phase of infection. The lowest concentration of SARS-CoV-2 viral copies this assay can detect is 131 copies/mL. A negative result does not preclude SARS-Cov-2 infection and should not be used as the sole basis for treatment or other patient management decisions. A negative result may occur with  improper specimen collection/handling, submission of specimen other than nasopharyngeal swab, presence of viral mutation(s) within the areas targeted by this assay, and inadequate number of viral copies (<131 copies/mL). A negative result must be combined with clinical observations, patient history, and epidemiological information. The expected result is Negative.  Fact Sheet for Patients:  PinkCheek.be  Fact Sheet for Healthcare Providers:  GravelBags.it  This test is no t yet approved or cleared by the [place] FDA and  has been authorized for detection and/or diagnosis of SARS-CoV-2 by FDA under an Emergency Use Authorization (EUA). This EUA will  remain  in effect (meaning this test can be used) for the duration of the COVID-19 declaration under Section 564(b)(1) of the Act, 21 U.S.C. section 360bbb-3(b)(1), unless the authorization is terminated or revoked sooner.     Influenza A by PCR NEGATIVE NEGATIVE Final   Influenza B by PCR NEGATIVE NEGATIVE Final    Comment: (NOTE) The Xpert Xpress SARS-CoV-2/FLU/RSV assay is intended as an aid in  the diagnosis of influenza from Nasopharyngeal swab specimens and  should not be used as a sole basis for treatment. Nasal washings and  aspirates are unacceptable for Xpert Xpress SARS-CoV-2/FLU/RSV  testing.  Fact Sheet for Patients: PinkCheek.be  Fact Sheet for Healthcare Providers: GravelBags.it  This test is not yet approved or cleared by the [place] FDA and  has been authorized for detection and/or diagnosis of SARS-CoV-2 by  FDA under an Emergency Use Authorization (EUA). This EUA will remain  in effect (meaning this test can be used) for the duration of the  Covid-19 declaration under Section 564(b)(1) of the Act, 21  U.S.C. section 360bbb-3(b)(1), unless the authorization is  terminated or revoked. Performed at [facility], [place] [address], [place], [place] [address]   Blood Culture (routine x 2)     Status: None   Collection Time: 11/08/20 12:40 PM   Specimen: BLOOD LEFT FOREARM  Result Value Ref Range Status   Specimen Description BLOOD LEFT FOREARM  Final   Special Requests   Final    BOTTLES DRAWN AEROBIC AND ANAEROBIC Blood Culture results may not be optimal due to an inadequate volume of blood received in culture bottles   Culture   Final    NO GROWTH 5 DAYS Performed at [facility], [place] [address] [place], [place] [address]    Report Status 11/13/2020 FINAL  Final  MRSA PCR Screening     Status: None   Collection Time: 11/08/20  9:54 PM   Specimen: Nasopharyngeal  Result Value Ref  Range Status  MRSA by PCR NEGATIVE NEGATIVE Final    Comment:        The GeneXpert MRSA Assay (FDA approved for NASAL specimens only), is one component of a comprehensive MRSA colonization surveillance program. It is not intended to diagnose MRSA infection nor to guide or monitor treatment for MRSA infections. Performed at [facility], [place] [address] [place], [place] [address]   Blood Culture (routine x 2)     Status: None   Collection Time: 11/08/20 10:46 PM   Specimen: BLOOD  Result Value Ref Range Status   Specimen Description BLOOD SITE NOT SPECIFIED  Final   Special Requests   Final    BOTTLES DRAWN AEROBIC ONLY Blood Culture adequate volume   Culture   Final    NO GROWTH 5 DAYS Performed at [facility], [place] [address], [place], [place] [address]    Report Status 11/13/2020 FINAL  Final  Urine culture     Status: None   Collection Time: 11/09/20  1:28 PM   Specimen: In/Out Cath Urine  Result Value Ref Range Status   Specimen Description IN/OUT CATH URINE  Final   Special Requests NONE  Final   Culture   Final    NO GROWTH Performed at [facility], [place] [address] [place], [place] [address]    Report Status 11/10/2020 FINAL  Final  SARS Coronavirus 2 by RT PCR (hospital order, performed in [facility] hospital lab) Nasopharyngeal Nasopharyngeal Swab     Status: None   Collection Time: 11/16/20  4:35 PM   Specimen: Nasopharyngeal Swab  Result Value Ref Range Status   SARS Coronavirus 2 NEGATIVE NEGATIVE Final    Comment: (NOTE) SARS-CoV-2 target nucleic acids are NOT DETECTED.  The SARS-CoV-2 RNA is generally detectable in upper and lower respiratory specimens during the acute phase of infection. The lowest concentration of SARS-CoV-2 viral copies this assay can detect is 250 copies / mL. A negative result does not preclude SARS-CoV-2 infection and should not be used as the sole basis for treatment or other patient management decisions.  A  negative result may occur with improper specimen collection / handling, submission of specimen other than nasopharyngeal swab, presence of viral mutation(s) within the areas targeted by this assay, and inadequate number of viral copies (<250 copies / mL). A negative result must be combined with clinical observations, patient history, and epidemiological information.  Fact Sheet for Patients:   StrictlyIdeas.no  Fact Sheet for Healthcare Providers: BankingDealers.co.za  This test is not yet approved or  cleared by the [place] FDA and has been authorized for detection and/or diagnosis of SARS-CoV-2 by FDA under an Emergency Use Authorization (EUA).  This EUA will remain in effect (meaning this test can be used) for the duration of the COVID-19 declaration under Section 564(b)(1) of the Act, 21 U.S.C. section 360bbb-3(b)(1), unless the authorization is terminated or revoked sooner.  Performed at [facility], [place] [address] [place], [place] [address]      Medications:   . acetaminophen  1,000 mg Oral Q6H  . calcium-vitamin D  1 tablet Oral Daily  . Chlorhexidine Gluconate Cloth  6 each Topical Daily  . enoxaparin (LOVENOX) injection  40 mg Subcutaneous Daily  . feeding supplement  237 mL Oral TID BM  . fluticasone  1 spray Each Nare Daily  . folic acid  1 mg Oral QHS  . hydrochlorothiazide  12.5 mg Oral Daily  . methocarbamol  1,000  mg Oral TID  . metoprolol tartrate  25 mg Oral BID  . multivitamin with minerals  1 tablet Oral QHS  . pantoprazole  40 mg Oral QHS  . pregabalin  150 mg Oral BID  . rosuvastatin  5 mg Oral Daily  . sodium chloride flush  10-40 mL Intracatheter Q12H  . thiamine  100 mg Oral QHS   Continuous Infusions:    LOS: 9 days   Charlynne Cousins  [facility]  11/17/2020, 7:28 AM

## 2020-11-17 NOTE — Progress Notes (Signed)
Patient resting in bed near end of shift. Recently came from ambulating to the bathroom. Patient denies any pain at this time. Last pain medication given [phone]. Patient on room air and tele d/c. Plan is for patient to d/c this am. Wound vac with 100 ml output. Colostomy bag emptied prior to end of shift. Safety measures remain in place and bed locked in lowest position. Will sbarr today shift nurse at change of shift.

## 2020-11-17 NOTE — Progress Notes (Signed)
Patient was returned from CT due to inability to obtain IV access. CT was not taken. I attempted to start an IV. A consult was put in for IV team.

## 2020-11-17 NOTE — Progress Notes (Signed)
Progress Note  9 Days Post-Op  Subjective: Patient reports some abdominal pain but not worsening. Denies nausea, tolerating diet and having bowel function. Some low grade temps overnight. Denies congestion, cough, urinary symptoms.   Objective: Vital signs in last 24 hours: Temp:  [97.8 F (36.6 C)-100 F (37.8 C)] 99.2 F (37.3 C) (11/24 0811) Pulse Rate:  [92-114] 108 (11/24 0811) Resp:  [13-16] 16 (11/24 0811) BP: (129-163)/(60-80) 143/60 (11/24 0811) SpO2:  [94 %-98 %] 94 % (11/24 0811) Weight:  [71.3 kg] 71.3 kg (11/24 0500) Last BM Date: 11/16/20  Intake/Output from previous day: 11/23 0701 - 11/24 0700 In: 500 [P.O.:500] Out: 400 [Stool:300] Intake/Output this shift: No intake/output data recorded.  PE: General: pleasant, WD,WNfemale Heart:sinus tachycardia in low 100s. Palpable radial and pedal pulses bilaterally Lungs: CTAB, no wheezes, rhonchi, or rales noted. Respiratory effort nonlabored Abd: soft,appropriately ttp, not distended, midline woundwith VAC present and thick appearing drainage in cannister, stomaviable withstoolin ostomy appliance MS:RUE in shoulder sling   Lab Results:  Recent Labs    11/15/20 1543  WBC 10.3  HGB 9.6*  HCT 29.3*  PLT 453*   BMET No results for input(s): NA, K, CL, CO2, GLUCOSE, BUN, CREATININE, CALCIUM in the last 72 hours. PT/INR No results for input(s): LABPROT, INR in the last 72 hours. CMP     Component Value Date/Time   NA 137 11/14/2020 0645   K 4.4 11/14/2020 0645   CL 103 11/14/2020 0645   CO2 25 11/14/2020 0645   GLUCOSE 118 (H) 11/14/2020 0645   BUN 9 11/14/2020 0645   CREATININE 0.44 11/14/2020 0645   CALCIUM 8.4 (L) 11/14/2020 0645   PROT 5.0 (L) 11/13/2020 0410   ALBUMIN 2.0 (L) 11/13/2020 0410   AST 40 11/13/2020 0410   ALT 35 11/13/2020 0410   ALKPHOS 68 11/13/2020 0410   BILITOT 0.6 11/13/2020 0410   GFRNONAA >60 11/14/2020 0645   GFRAA >90 01/15/2012 0344   Lipase      Component Value Date/Time   LIPASE 25 11/08/2020 1236       Studies/Results: No results found.  Anti-infectives: Anti-infectives (From admission, onward)   Start     Dose/Rate Route Frequency Ordered Stop   11/09/20 2330  piperacillin-tazobactam (ZOSYN) IVPB 3.375 g       "Followed by" Linked Group Details   3.375 g 12.5 mL/hr over 240 Minutes Intravenous Every 8 hours 11/08/20 1704 11/14/20 2019   11/08/20 2200  ceFEPIme (MAXIPIME) 2 g in sodium chloride 0.9 % 100 mL IVPB  Status:  Discontinued        2 g 200 mL/hr over 30 Minutes Intravenous Every 12 hours 11/08/20 1335 11/08/20 1638   11/08/20 1830  piperacillin-tazobactam (ZOSYN) IVPB 3.375 g        3.375 g 12.5 mL/hr over 240 Minutes Intravenous To Surgery 11/08/20 1823 11/09/20 1830   11/08/20 1715  piperacillin-tazobactam (ZOSYN) IVPB 3.375 g       "Followed by" Linked Group Details   3.375 g 100 mL/hr over 30 Minutes Intravenous  Once 11/08/20 1704 11/13/20 1030   11/08/20 1245  ceFEPIme (MAXIPIME) 2 g in sodium chloride 0.9 % 100 mL IVPB        2 g 200 mL/hr over 30 Minutes Intravenous  Once 11/08/20 1243 11/08/20 1338       Assessment/Plan HTN HLD Chronic pain Recent hx of R humerus fracture -Dr. Veverly Fells recommends f/u in 2 weeks  Perforated sigmoid diverticulitis  S/p Hartmann's procedure 11/08/20  Dr. Barry Dienes - POD#9 -VAC M/W/F - thick appearing drainage in canister this AM, would like to see wound with WOC today  -good bowel function  - Antibioticsstopped 11/21, low grade temps overnight, repeat CBC this AM - CT A/P to r/o post-op abscess  FEN: NPO VTE: lovenox ID: cefepime 11/15; Zosyn 11/15>11/21  LOS: 9 days    Norm Parcel , [facility] Surgery 11/17/2020, 8:12 AM Please see Amion for pager number during day hours 7:00am-4:30pm

## 2020-11-17 NOTE — Consult Note (Addendum)
[place] Nurse wound follow up Patient care given in [facility] 4N08 Attempted to change wound vac dressing today, however this patient should be discharged pending insurance authorization has come through. Spoke with bedside RN, her plans are to call SW to see what the status is. If the patient receives her portable vac today and will be discharged, we will return and change out the dressing. If the portable vac does not arrive, the bedside RN will take dressing off and redress with moistened saline gauze and cover with ABD pad for transportation. [facility] will restart vac when patient arrives. Bedside RN will page me to let me know the status.   Spoke with Barkley Boards, PA for [facility] per [facility] and she has currently taken the [facility] dressing off and is getting a CT to rule out intra-abdominal abscess. Wound VAC will not be started back at this time.  WOC will continue to follow for ostomy care and education until discharge.   Cathlean Marseilles Tamala Julian, MSN, RN, [place], Lysle Pearl, [facility] Wound Treatment Associate Pager [phone]

## 2020-11-17 NOTE — TOC Progression Note (Signed)
Transition of Care [facility]) - Progression Note    Patient Details  Name: Chloe Young MRN: [id number] Date of Birth: 03-May-1951  Transition of Care [facility]) CM/SW [place], [place] Phone Number: 11/17/2020, 2:59 PM  Clinical Narrative:     CSW informed by RN and Charge RN, patient will not discharge today. CSW informed [facility], patient is not admitting today.    [facility] will admit over the weekend if medically stable - CSW will continue to follow and assist with discharge planning.  Thurmond Butts, MSW, [place] Clinical Social Worker   Expected Discharge Plan: Skilled Nursing Facility Barriers to Discharge: Continued Medical Work up  Expected Discharge Plan and Services Expected Discharge Plan: [place] In-house Referral: Clinical Social Work                                             Social Determinants of Health (SDOH) Interventions    Readmission Risk Interventions No flowsheet data found.

## 2020-11-17 NOTE — Progress Notes (Signed)
Patient wound vac removed and wet to dry dressing was placed. Patient tolerated well. Orders are in for CT with PO contrast.

## 2020-11-18 DIAGNOSIS — I10 Essential (primary) hypertension: Secondary | ICD-10-CM | POA: Diagnosis not present

## 2020-11-18 DIAGNOSIS — R198 Other specified symptoms and signs involving the digestive system and abdomen: Secondary | ICD-10-CM | POA: Diagnosis not present

## 2020-11-18 DIAGNOSIS — A419 Sepsis, unspecified organism: Secondary | ICD-10-CM | POA: Diagnosis not present

## 2020-11-18 DIAGNOSIS — K659 Peritonitis, unspecified: Secondary | ICD-10-CM | POA: Diagnosis not present

## 2020-11-18 MED ORDER — AMOXICILLIN-POT CLAVULANATE 875-125 MG PO TABS
1.0000 | ORAL_TABLET | Freq: Two times a day (BID) | ORAL | Status: DC
  Administered 2020-11-18 – 2020-11-22 (×10): 1 via ORAL
  Filled 2020-11-18 (×10): qty 1

## 2020-11-18 MED ORDER — AMOXICILLIN-POT CLAVULANATE 875-125 MG PO TABS
1.0000 | ORAL_TABLET | Freq: Two times a day (BID) | ORAL | Status: DC
  Filled 2020-11-18: qty 1

## 2020-11-18 MED ORDER — PIPERACILLIN-TAZOBACTAM 3.375 G IVPB 30 MIN
3.3750 g | Freq: Once | INTRAVENOUS | Status: AC
  Administered 2020-11-18: 3.375 g via INTRAVENOUS
  Filled 2020-11-18 (×2): qty 50

## 2020-11-18 MED ORDER — PIPERACILLIN-TAZOBACTAM 3.375 G IVPB: 3.3750 g | Freq: Three times a day (TID) | INTRAVENOUS | Status: DC

## 2020-11-18 NOTE — Progress Notes (Signed)
Progress Note  10 Days Post-Op  Subjective: Feeling generally well, some twinges of pain around the stoma   Objective: Vital signs in last 24 hours: Temp:  [98 F (36.7 C)-98.7 F (37.1 C)] 98.2 F (36.8 C) (11/25 0827) Pulse Rate:  [94-122] 101 (11/25 0827) Resp:  [16-20] 18 (11/25 0827) BP: (116-165)/(49-71) 124/69 (11/25 0827) SpO2:  [95 %-96 %] 96 % (11/25 0827) Weight:  [68.7 kg] 68.7 kg (11/25 0359) Last BM Date: 11/18/20  Intake/Output from previous day: 11/24 0701 - 11/25 0700 In: 480 [P.O.:480] Out: -  Intake/Output this shift: No intake/output data recorded.  PE: General: pleasant, WD,WNfemale Heart:RRR. Palpable radial and pedal pulses bilaterally Lungs: CTAB, no wheezes, rhonchi, or rales noted. Respiratory effort nonlabored Abd: soft,appropriately ttp, not distended, midline woundclean and dry with early granulation, small amount of necrotic fat at the base, no significant drainage or exudate on the previous dressing MS:RUE working on mobility   Lab Results:  Recent Labs    11/15/20 1543 11/17/20 0744  WBC 10.3 12.3*  HGB 9.6* 8.5*  HCT 29.3* 26.2*  PLT 453* 514*   BMET Recent Labs    11/17/20 0744  NA 134*  K 3.9  CL 97*  CO2 24  GLUCOSE 121*  BUN 8  CREATININE 0.53  CALCIUM 8.8*   PT/INR No results for input(s): LABPROT, INR in the last 72 hours. CMP     Component Value Date/Time   NA 134 (L) 11/17/2020 0744   K 3.9 11/17/2020 0744   CL 97 (L) 11/17/2020 0744   CO2 24 11/17/2020 0744   GLUCOSE 121 (H) 11/17/2020 0744   BUN 8 11/17/2020 0744   CREATININE 0.53 11/17/2020 0744   CALCIUM 8.8 (L) 11/17/2020 0744   PROT 5.0 (L) 11/13/2020 0410   ALBUMIN 2.0 (L) 11/13/2020 0410   AST 40 11/13/2020 0410   ALT 35 11/13/2020 0410   ALKPHOS 68 11/13/2020 0410   BILITOT 0.6 11/13/2020 0410   GFRNONAA >60 11/17/2020 0744   GFRAA >90 01/15/2012 0344   Lipase     Component Value Date/Time   LIPASE 25 11/08/2020 1236        Studies/Results: CT ABDOMEN PELVIS W CONTRAST  Result Date: 11/17/2020 CLINICAL DATA:  Abdominal abscess. EXAM: CT ABDOMEN AND PELVIS WITH CONTRAST TECHNIQUE: Multidetector CT imaging of the abdomen and pelvis was performed using the standard protocol following bolus administration of intravenous contrast. CONTRAST:  122mL OMNIPAQUE IOHEXOL 300 MG/ML  SOLN COMPARISON:  CT dated November 08, 2020 FINDINGS: Lower chest: Bibasilar atelectasis is noted, right worse than left.The heart size is normal. Hepatobiliary: The liver is normal. Normal gallbladder.There is no biliary ductal dilation. Pancreas: Normal contours without ductal dilatation. No peripancreatic fluid collection. Spleen: Unremarkable. Adrenals/Urinary Tract: --Adrenal glands: Unremarkable. --Right kidney/ureter: No hydronephrosis or radiopaque kidney stones. --Left kidney/ureter: There is a punctate nonobstructing stone in the interpolar region of the left kidney. --Urinary bladder: There is gas within the urinary bladder which is presumably from recent instrumentation. Stomach/Bowel: --Stomach/Duodenum: No hiatal hernia or other gastric abnormality. Normal duodenal course and caliber. --Small bowel: There are mildly hyperenhancing loops of small bowel in the low midline pelvis, likely reactive. 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 The colostomy is unremarkable. There is no evidence for large bowel obstruction. Oral contrast is noted within the colon. --Appendix: Normal. Vascular/Lymphatic: Atherosclerotic calcification is present within the non-aneurysmal abdominal aorta, without hemodynamically significant stenosis. --No  retroperitoneal lymphadenopathy. --No mesenteric lymphadenopathy. --there are few prominent lymph nodes along the left pelvic sidewall, likely reactive. Reproductive: Status post hysterectomy. No adnexal mass. Other: Small developing abscesses  are noted in the patient's pelvis measuring up to approximately 2.8 cm. There are few pockets of extraluminal gas in the patient's pelvis likely postsurgical in etiology. There are phlegm a tori changes in the patient's low abdomen and pelvis, likely postsurgical. There is an open midline incision. 2 Musculoskeletal. No acute displaced fractures. IMPRESSION: 1. Postsurgical changes of the abdomen as detailed above. 2. Small developing abscesses are noted in the patient's pelvis measuring up to approximately 2.8 cm. 3. There is gas within the urinary bladder which is presumably from recent instrumentation. 4. Bibasilar atelectasis, right worse than left. 5. Punctate nonobstructing stone in the interpolar region of the left kidney. Aortic Atherosclerosis (ICD10-I70.0). Electronically Signed   By: Constance Holster M.D.   On: 11/17/2020 20:00    Anti-infectives: Anti-infectives (From admission, onward)   Start     Dose/Rate Route Frequency Ordered Stop   11/18/20 1500  piperacillin-tazobactam (ZOSYN) IVPB 3.375 g        3.375 g 12.5 mL/hr over 240 Minutes Intravenous Every 8 hours 11/18/20 0757     11/18/20 1000  amoxicillin-clavulanate (AUGMENTIN) 875-125 MG per tablet 1 tablet  Status:  Discontinued        1 tablet Oral Every 12 hours 11/18/20 0656 11/18/20 0757   11/18/20 0845  piperacillin-tazobactam (ZOSYN) IVPB 3.375 g        3.375 g 100 mL/hr over 30 Minutes Intravenous  Once 11/18/20 0757 11/18/20 0933   11/09/20 2330  piperacillin-tazobactam (ZOSYN) IVPB 3.375 g       "Followed by" Linked Group Details   3.375 g 12.5 mL/hr over 240 Minutes Intravenous Every 8 hours 11/08/20 1704 11/14/20 2019   11/08/20 2200  ceFEPIme (MAXIPIME) 2 g in sodium chloride 0.9 % 100 mL IVPB  Status:  Discontinued        2 g 200 mL/hr over 30 Minutes Intravenous Every 12 hours 11/08/20 1335 11/08/20 1638   11/08/20 1830  piperacillin-tazobactam (ZOSYN) IVPB 3.375 g        3.375 g 12.5 mL/hr over 240 Minutes  Intravenous To Surgery 11/08/20 1823 11/09/20 1830   11/08/20 1715  piperacillin-tazobactam (ZOSYN) IVPB 3.375 g       "Followed by" Linked Group Details   3.375 g 100 mL/hr over 30 Minutes Intravenous  Once 11/08/20 1704 11/13/20 1030   11/08/20 1245  ceFEPIme (MAXIPIME) 2 g in sodium chloride 0.9 % 100 mL IVPB        2 g 200 mL/hr over 30 Minutes Intravenous  Once 11/08/20 1243 11/08/20 1338       Assessment/Plan HTN HLD Chronic pain Recent hx of R humerus fracture -Dr. 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 Barry Dienes - POD#10 -Continue damp to dry dressing changes to midline wound - Antibioticsstopped 11/21, repeat CT yesterday with small pelvic abscesses. Too small to drain. WBC 12, no fever, generally feeling well. Restart abx (augmentin) and continue to monitor.   FEN: reg VTE: lovenox ID: cefepime 11/15; Zosyn 11/15>11/21, augmentin 11/25->  LOS: 10 days    [facility] 11/18/2020, 9:38 AM Please see Amion for pager number during day hours 7:00am-4:30pm

## 2020-11-18 NOTE — Progress Notes (Signed)
[facility] PROGRESS NOTE    Progress Note  Chloe Young  OBS:[id number] DOB: 1951-03-16 DOA: 11/08/2020 PCP: Binnie Rail, MD     Brief Narrative:   Chloe Young is an 69 y.o. 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  The patient developed postoperative ileus required NG tube and TPN, ileus resolved NG tube removed on 11/13/2020 was allowed a soft diet.  She subsequently started developing a mild fever with purulent drainage on the wound VAC, CT scan of the abdomen and pelvis was done that showed multiple abscesses she was restarted on empiric antibiotics  Assessment/Plan:   Septic shock due to peritonitis in the setting of bowel perforation: Wound VAC with purulent material, surgery recommended a CT scan of the abdomen and pelvis showed multiple abscesses she was restarted on IV Zosyn. Blood cultures were sent, further management per surgery. She is tolerating her diet has no abdominal pain.  Acute right humeral fracture: In a sling continue pain medication.  Alcohol use: No signs of withdrawal.  Essential hypertension and hyperlipidemia: Continue current regimen no changes made.   DVT prophylaxis: lovenxo Family  Communication:none Status is: Inpatient  Remains inpatient appropriate because:Hemodynamically unstable   Dispo: The patient is from: Home              Anticipated d/c is to: SNF              Anticipated d/c date is: > 3 day              Patient currently is not medically stable to d/c.     Code Status:     Code Status Orders  (From admission, onward)         Start     Ordered   11/08/20 1633  Full code  Continuous        11/08/20 1634        Code Status History    This patient has a current code status but no historical code status.   Advance Care Planning Activity        IV Access:    Peripheral IV   Procedures and diagnostic studies:   CT ABDOMEN PELVIS W CONTRAST  Result Date: 11/17/2020 CLINICAL DATA:  Abdominal abscess. EXAM: CT ABDOMEN AND PELVIS WITH CONTRAST TECHNIQUE: Multidetector CT imaging of the abdomen and pelvis was performed using the standard protocol following bolus administration of intravenous contrast. CONTRAST:  120mL OMNIPAQUE IOHEXOL 300 MG/ML  SOLN COMPARISON:  CT dated November 08, 2020 FINDINGS: Lower chest: Bibasilar atelectasis is noted, right worse than left.The heart size is normal. Hepatobiliary: The liver is normal. Normal gallbladder.There is no biliary ductal dilation. Pancreas: Normal contours without ductal dilatation. No peripancreatic fluid collection. Spleen: Unremarkable. Adrenals/Urinary Tract: --Adrenal glands: Unremarkable. --Right kidney/ureter: No hydronephrosis or radiopaque  kidney stones. --Left kidney/ureter: There is a punctate nonobstructing stone in the interpolar region of the left kidney. --Urinary bladder: There is gas within the urinary bladder which is presumably from recent instrumentation. Stomach/Bowel: --Stomach/Duodenum: No hiatal hernia or other gastric abnormality. Normal duodenal course and caliber. --Small bowel: There are mildly hyperenhancing loops of small bowel in the low midline pelvis, likely  reactive. 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 The colostomy is unremarkable. There is no evidence for large bowel obstruction. Oral contrast is noted within the colon. --Appendix: Normal. Vascular/Lymphatic: Atherosclerotic calcification is present within the non-aneurysmal abdominal aorta, without hemodynamically significant stenosis. --No retroperitoneal lymphadenopathy. --No mesenteric lymphadenopathy. --there are few prominent lymph nodes along the left pelvic sidewall, likely reactive. Reproductive: Status post hysterectomy. No adnexal mass. Other: Small developing abscesses are noted in the patient's pelvis measuring up to approximately 2.8 cm. There are few pockets of extraluminal gas in the patient's pelvis likely postsurgical in etiology. There are phlegm a tori changes in the patient's low abdomen and pelvis, likely postsurgical. There is an open midline incision. 2 Musculoskeletal. No acute displaced fractures. IMPRESSION: 1. Postsurgical changes of the abdomen as detailed above. 2. Small developing abscesses are noted in the patient's pelvis measuring up to approximately 2.8 cm. 3. There is gas within the urinary bladder which is presumably from recent instrumentation. 4. Bibasilar atelectasis, right worse than left. 5. Punctate nonobstructing stone in the interpolar region of the left kidney. Aortic Atherosclerosis (ICD10-I70.0). Electronically Signed   By: Constance Holster M.D.   On: 11/17/2020 20:00     Medical Consultants:    None.  Anti-Infectives:   none  Subjective:    Chloe Young denies any abdominal pain tolerating her diet.  Objective:    Vitals:   11/17/20 2130 11/18/20 0020 11/18/20 0349 11/18/20 0359  BP: (!) 116/53 (!) 122/55 116/70   Pulse: 99 96 94   Resp:  18 20   Temp:  98 F (36.7 C) 98.4 F (36.9 C)   TempSrc:  Oral Oral   SpO2:  95% 95%   Weight:    68.7 kg  Height:        SpO2: 95 % O2 Flow Rate (L/min): 1 L/min   Intake/Output Summary (Last 24 hours) at 11/18/2020 0719 Last data filed at 11/18/2020 0000 Gross per 24 hour  Intake 480 ml  Output --  Net 480 ml   Filed Weights   11/16/20 0500 11/17/20 0500 11/18/20 0359  Weight: 71.7 kg 71.3 kg 68.7 kg    Exam: General exam: In no acute distress. Respiratory system: Good air movement and clear to auscultation. Cardiovascular system: S1 & S2 heard, RRR. No JVD. Gastrointestinal system: Wound VAC in place no abdominal distention no rebound or guarding Extremities: No pedal edema. Skin: No rashes, lesions or ulcers Psychiatry: Judgement and insight appear normal. Mood & affect appropriate.   Data Reviewed:    Labs: Basic Metabolic Panel: Recent Labs  Lab 11/12/20 0430 11/12/20 0430 11/13/20 0410 11/13/20 0410 11/14/20 0645 11/17/20 0744  NA 139  --  136  --  137 134*  K 3.2*   < > 3.7   < > 4.4 3.9  CL 111  --  104  --  103 97*  CO2 23  --  24  --  25 24  GLUCOSE 139*  --  133*  --  118* 121*  BUN 8  --  8  --  9 8  CREATININE 0.50  --  0.40*  --  0.44 0.53  CALCIUM 7.9*  --  8.2*  --  8.4* 8.8*  MG 2.0  --  1.8  --  2.1  --   PHOS 2.5  --  2.6  --   --   --    < > = values in this interval not displayed.   GFR Estimated Creatinine Clearance: 60.8 mL/min (by C-G formula based on SCr of 0.53 mg/dL). Liver Function Tests: Recent Labs  Lab 11/12/20 0430 11/13/20 0410  AST 19 40  ALT 20 35  ALKPHOS 49 68  BILITOT 0.7 0.6  PROT 4.9* 5.0*  ALBUMIN 2.0* 2.0*   No results for input(s): LIPASE, AMYLASE in the last 168 hours. No results for input(s): AMMONIA in the last 168 hours. Coagulation profile No results for input(s): INR, PROTIME in the last 168 hours. COVID-19 Labs  No results for input(s): DDIMER, FERRITIN, LDH, CRP in the last 72 hours.  Lab Results  Component Value Date   SARSCOV2NAA NEGATIVE 11/16/2020   [place] NEGATIVE 11/08/2020    CBC: Recent  Labs  Lab 11/13/20 0410 11/15/20 1543 11/17/20 0744  WBC 8.7 10.3 12.3*  HGB 8.8* 9.6* 8.5*  HCT 26.6* 29.3* 26.2*  MCV 95.3 93.6 94.2  PLT 277 453* 514*   Cardiac Enzymes: No results for input(s): CKTOTAL, CKMB, CKMBINDEX, TROPONINI in the last 168 hours. BNP (last 3 results) No results for input(s): PROBNP in the last 8760 hours. CBG: Recent Labs  Lab 11/15/20 1112 11/15/20 1604 11/16/20 0835 11/16/20 1151 11/16/20 1621  GLUCAP 98 97 95 103* 117*   D-Dimer: No results for input(s): DDIMER in the last 72 hours. Hgb A1c: No results for input(s): HGBA1C in the last 72 hours. Lipid Profile: No results for input(s): CHOL, HDL, LDLCALC, TRIG, CHOLHDL, LDLDIRECT in the last 72 hours. Thyroid function studies: No results for input(s): TSH, T4TOTAL, T3FREE, THYROIDAB in the last 72 hours.  Invalid input(s): FREET3 Anemia work up: No results for input(s): VITAMINB12, FOLATE, FERRITIN, TIBC, IRON, RETICCTPCT in the last 72 hours. Sepsis Labs: Recent Labs  Lab 11/13/20 0410 11/15/20 1543 11/17/20 0744  WBC 8.7 10.3 12.3*   Microbiology Recent Results (from the past 240 hour(s))  Respiratory Panel by RT PCR (Flu A&B, Covid) - Nasopharyngeal Swab     Status: None   Collection Time: 11/08/20 12:36 PM   Specimen: Nasopharyngeal Swab  Result Value Ref Range Status   SARS Coronavirus 2 by RT PCR NEGATIVE NEGATIVE Final    Comment: (NOTE) SARS-CoV-2 target nucleic acids are NOT DETECTED.  The SARS-CoV-2 RNA is generally detectable in upper respiratoy specimens during the acute phase of infection. The lowest concentration of SARS-CoV-2 viral copies this assay can detect is 131 copies/mL. A negative result does not preclude SARS-Cov-2 infection and should not be used as the sole basis for treatment or other patient management decisions. A negative result may occur with  improper specimen collection/handling, submission of specimen other than nasopharyngeal swab, presence of  viral mutation(s) within the areas targeted by this assay, and inadequate number of viral copies (<131 copies/mL). A negative result must be combined with clinical observations, patient history, and epidemiological information. The expected result is Negative.  Fact Sheet for Patients:  PinkCheek.be  Fact Sheet for Healthcare Providers:  GravelBags.it  This test is no t yet approved or cleared by the [place] FDA and  has been authorized for detection and/or  diagnosis of SARS-CoV-2 by FDA under an Emergency Use Authorization (EUA). This EUA will remain  in effect (meaning this test can be used) for the duration of the COVID-19 declaration under Section 564(b)(1) of the Act, 21 U.S.C. section 360bbb-3(b)(1), unless the authorization is terminated or revoked sooner.     Influenza A by PCR NEGATIVE NEGATIVE Final   Influenza B by PCR NEGATIVE NEGATIVE Final    Comment: (NOTE) The Xpert Xpress SARS-CoV-2/FLU/RSV assay is intended as an aid in  the diagnosis of influenza from Nasopharyngeal swab specimens and  should not be used as a sole basis for treatment. Nasal washings and  aspirates are unacceptable for Xpert Xpress SARS-CoV-2/FLU/RSV  testing.  Fact Sheet for Patients: PinkCheek.be  Fact Sheet for Healthcare Providers: GravelBags.it  This test is not yet approved or cleared by the [place] FDA and  has been authorized for detection and/or diagnosis of SARS-CoV-2 by  FDA under an Emergency Use Authorization (EUA). This EUA will remain  in effect (meaning this test can be used) for the duration of the  Covid-19 declaration under Section 564(b)(1) of the Act, 21  U.S.C. section 360bbb-3(b)(1), unless the authorization is  terminated or revoked. Performed at [facility], [place] [address], [place], [place] [address]   Blood Culture (routine x 2)      Status: None   Collection Time: 11/08/20 12:40 PM   Specimen: BLOOD LEFT FOREARM  Result Value Ref Range Status   Specimen Description BLOOD LEFT FOREARM  Final   Special Requests   Final    BOTTLES DRAWN AEROBIC AND ANAEROBIC Blood Culture results may not be optimal due to an inadequate volume of blood received in culture bottles   Culture   Final    NO GROWTH 5 DAYS Performed at [facility], [place] [address] [place], [place] [address]    Report Status 11/13/2020 FINAL  Final  MRSA PCR Screening     Status: None   Collection Time: 11/08/20  9:54 PM   Specimen: Nasopharyngeal  Result Value Ref Range Status   MRSA by PCR NEGATIVE NEGATIVE Final    Comment:        The GeneXpert MRSA Assay (FDA approved for NASAL specimens only), is one component of a comprehensive MRSA colonization surveillance program. It is not intended to diagnose MRSA infection nor to guide or monitor treatment for MRSA infections. Performed at [facility], [place] [address], [place], [place] [address]   Blood Culture (routine x 2)     Status: None   Collection Time: 11/08/20 10:46 PM   Specimen: BLOOD  Result Value Ref Range Status   Specimen Description BLOOD SITE NOT SPECIFIED  Final   Special Requests   Final    BOTTLES DRAWN AEROBIC ONLY Blood Culture adequate volume   Culture   Final    NO GROWTH 5 DAYS Performed at [facility], [place] [address], [place], [place] [address]    Report Status 11/13/2020 FINAL  Final  Urine culture     Status: None   Collection Time: 11/09/20  1:28 PM   Specimen: In/Out Cath Urine  Result Value Ref Range Status   Specimen Description IN/OUT CATH URINE  Final   Special Requests NONE  Final   Culture   Final    NO GROWTH Performed at [facility], [place] [address] [place], [place] [address]    Report Status 11/10/2020 FINAL  Final  SARS Coronavirus  2 by RT PCR (hospital order, performed in [facility] hospital lab) Nasopharyngeal  Nasopharyngeal Swab     Status: None   Collection Time: 11/16/20  4:35 PM   Specimen: Nasopharyngeal Swab  Result Value Ref Range Status   SARS Coronavirus 2 NEGATIVE NEGATIVE Final    Comment: (NOTE) SARS-CoV-2 target nucleic acids are NOT DETECTED.  The SARS-CoV-2 RNA is generally detectable in upper and lower respiratory specimens during the acute phase of infection. The lowest concentration of SARS-CoV-2 viral copies this assay can detect is 250 copies / mL. A negative result does not preclude SARS-CoV-2 infection and should not be used as the sole basis for treatment or other patient management decisions.  A negative result may occur with improper specimen collection / handling, submission of specimen other than nasopharyngeal swab, presence of viral mutation(s) within the areas targeted by this assay, and inadequate number of viral copies (<250 copies / mL). A negative result must be combined with clinical observations, patient history, and epidemiological information.  Fact Sheet for Patients:   StrictlyIdeas.no  Fact Sheet for Healthcare Providers: BankingDealers.co.za  This test is not yet approved or  cleared by the [place] FDA and has been authorized for detection and/or diagnosis of SARS-CoV-2 by FDA under an Emergency Use Authorization (EUA).  This EUA will remain in effect (meaning this test can be used) for the duration of the COVID-19 declaration under Section 564(b)(1) of the Act, 21 U.S.C. section 360bbb-3(b)(1), unless the authorization is terminated or revoked sooner.  Performed at [facility], [place] [address] [place], [place] [address]      Medications:    acetaminophen  1,000 mg Oral Q6H   amoxicillin-clavulanate  1 tablet Oral Q12H   calcium-vitamin D  1 tablet Oral Daily   Chlorhexidine Gluconate Cloth  6 each Topical Daily   enoxaparin (LOVENOX) injection  40 mg Subcutaneous Daily    feeding supplement  237 mL Oral TID BM   fluticasone  1 spray Each Nare Daily   folic acid  1 mg Oral QHS   hydrochlorothiazide  12.5 mg Oral Daily   methocarbamol  1,000 mg Oral TID   metoprolol tartrate  25 mg Oral BID   multivitamin with minerals  1 tablet Oral QHS   pantoprazole  40 mg Oral QHS   pregabalin  150 mg Oral BID   rosuvastatin  5 mg Oral Daily   sodium chloride flush  10-40 mL Intracatheter Q12H   thiamine  100 mg Oral QHS   Continuous Infusions:    LOS: 10 days   Charlynne Cousins  [facility]  11/18/2020, 7:19 AM

## 2020-11-18 NOTE — Progress Notes (Signed)
Pharmacy Antibiotic Note  Chloe Young is a 69 y.o. female admitted on 11/08/2020 with intra-abdominal infection.  Pharmacy has been consulted for Zosyn dosing. SCr 0.53 stable.  Plan: Zosyn 3.375g IV (49min infusion) x1; then 3.375g IV q8h (4h infusion) Monitor clinical progress, c/s, renal function F/u de-escalation plan/LOT Pharmacy will sign off consult and monitor peripherally   Height: 5\' 3"  (160 cm) Weight: 68.7 kg (151 lb 7.3 oz) IBW/kg (Calculated) : 52.4  Temp (24hrs), Avg:98.6 F (37 C), Min:98 F (36.7 C), Max:99.2 F (37.3 C)  Recent Labs  Lab 11/12/20 0430 11/13/20 0410 11/14/20 0645 11/15/20 1543 11/17/20 0744  WBC  --  8.7  --  10.3 12.3*  CREATININE 0.50 0.40* 0.44  --  0.53    Estimated Creatinine Clearance: 60.8 mL/min (by C-G formula based on SCr of 0.53 mg/dL).    No Known Allergies  Arturo Morton, PharmD, BCPS Please check AMION for all [place] contact numbers Clinical Pharmacist 11/18/2020 7:56 AM

## 2020-11-19 DIAGNOSIS — I10 Essential (primary) hypertension: Secondary | ICD-10-CM | POA: Diagnosis not present

## 2020-11-19 DIAGNOSIS — A419 Sepsis, unspecified organism: Secondary | ICD-10-CM | POA: Diagnosis not present

## 2020-11-19 DIAGNOSIS — K659 Peritonitis, unspecified: Secondary | ICD-10-CM | POA: Diagnosis not present

## 2020-11-19 DIAGNOSIS — R198 Other specified symptoms and signs involving the digestive system and abdomen: Secondary | ICD-10-CM | POA: Diagnosis not present

## 2020-11-19 LAB — CBC
HCT: 25.6 % — ABNORMAL LOW (ref 36.0–46.0)
Hemoglobin: 8.3 g/dL — ABNORMAL LOW (ref 12.0–15.0)
MCH: 31.1 pg (ref 26.0–34.0)
MCHC: 32.4 g/dL (ref 30.0–36.0)
MCV: 95.9 fL (ref 80.0–100.0)
Platelets: 601 10*3/uL — ABNORMAL HIGH (ref 150–400)
RBC: 2.67 MIL/uL — ABNORMAL LOW (ref 3.87–5.11)
RDW: 13.7 % (ref 11.5–15.5)
WBC: 11.6 10*3/uL — ABNORMAL HIGH (ref 4.0–10.5)
nRBC: 0 % (ref 0.0–0.2)

## 2020-11-19 LAB — BASIC METABOLIC PANEL
Anion gap: 9 (ref 5–15)
BUN: 6 mg/dL — ABNORMAL LOW (ref 8–23)
CO2: 27 mmol/L (ref 22–32)
Calcium: 8.9 mg/dL (ref 8.9–10.3)
Chloride: 97 mmol/L — ABNORMAL LOW (ref 98–111)
Creatinine, Ser: 0.52 mg/dL (ref 0.44–1.00)
GFR, Estimated: >60 mL/min (ref >60)
Glucose, Bld: 116 mg/dL — ABNORMAL HIGH (ref 70–99)
Potassium: 4.1 mmol/L (ref 3.5–5.1)
Sodium: 133 mmol/L — ABNORMAL LOW (ref 135–145)

## 2020-11-19 LAB — MAGNESIUM: Magnesium: 2.3 mg/dL (ref 1.7–2.4)

## 2020-11-19 NOTE — Progress Notes (Signed)
[facility] PROGRESS NOTE    Progress Note  ERIAL FIKES  XAJ:[id number] DOB: [birth year] DOA: 11/08/2020 PCP: Binnie Rail, MD     Brief Narrative:   Chloe Young is an 69 y.o. 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  The patient developed postoperative ileus required NG tube and TPN, ileus resolved NG tube removed on 11/13/2020 was allowed a soft diet.  She subsequently started developing a mild fever with purulent drainage on the wound VAC, CT scan of the abdomen and pelvis was done that showed multiple abscesses she was restarted on empiric antibiotics  Assessment/Plan:   Septic shock due to peritonitis in the setting of bowel perforation: Surgery recommended to change to oral Augmentin, has remained afebrile leukocytosis improving. Cultures have remained negative till date, she is tolerating her diet denies abdominal pain. Wound VAC was taken off, further management per surgery.  Acute right humeral fracture: In a sling continue pain medication.  Alcohol use: No signs of withdrawal.  Essential hypertension and hyperlipidemia: Continue current regimen no changes made.   DVT prophylaxis: lovenxo Family  Communication:none Status is: Inpatient  Remains inpatient appropriate because:Hemodynamically unstable   Dispo: The patient is from: Home              Anticipated d/c is to: SNF              Anticipated d/c date is: > 3 day              Patient currently is not medically stable to d/c.     Code Status:     Code Status Orders  (From admission, onward)         Start     Ordered   11/08/20 1633  Full code  Continuous        11/08/20 1634        Code Status History    This patient has a current code status but no historical code status.   Advance Care Planning Activity        IV Access:    Peripheral IV   Procedures and diagnostic studies:   CT ABDOMEN PELVIS W CONTRAST  Result Date: 11/17/2020 CLINICAL DATA:  Abdominal abscess. EXAM: CT ABDOMEN AND PELVIS WITH CONTRAST TECHNIQUE: Multidetector CT imaging of the abdomen and pelvis was performed using the standard protocol following bolus administration of intravenous contrast. CONTRAST:  12mL OMNIPAQUE IOHEXOL 300 MG/ML  SOLN COMPARISON:  CT dated November 08, 2020 FINDINGS: Lower chest: Bibasilar atelectasis is noted, right worse than left.The heart size is normal. Hepatobiliary: The liver is normal. Normal gallbladder.There is no biliary ductal dilation. Pancreas: Normal contours without ductal dilatation. No peripancreatic fluid collection. Spleen: Unremarkable. Adrenals/Urinary Tract: --Adrenal glands: Unremarkable. --Right kidney/ureter: No hydronephrosis or radiopaque kidney stones. --Left kidney/ureter: There is  a punctate nonobstructing stone in the interpolar region of the left kidney. --Urinary bladder: There is gas within the urinary bladder which is presumably from recent instrumentation. Stomach/Bowel: --Stomach/Duodenum: No hiatal hernia or other gastric abnormality. Normal duodenal course and caliber. --Small bowel: There are mildly hyperenhancing loops of small bowel in the low midline pelvis, likely  reactive. 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 The colostomy is unremarkable. There is no evidence for large bowel obstruction. Oral contrast is noted within the colon. --Appendix: Normal. Vascular/Lymphatic: Atherosclerotic calcification is present within the non-aneurysmal abdominal aorta, without hemodynamically significant stenosis. --No retroperitoneal lymphadenopathy. --No mesenteric lymphadenopathy. --there are few prominent lymph nodes along the left pelvic sidewall, likely reactive. Reproductive: Status post hysterectomy. No adnexal mass. Other: Small developing abscesses are noted in the patient's pelvis measuring up to approximately 2.8 cm. There are few pockets of extraluminal gas in the patient's pelvis likely postsurgical in etiology. There are phlegm a tori changes in the patient's low abdomen and pelvis, likely postsurgical. There is an open midline incision. 2 Musculoskeletal. No acute displaced fractures. IMPRESSION: 1. Postsurgical changes of the abdomen as detailed above. 2. Small developing abscesses are noted in the patient's pelvis measuring up to approximately 2.8 cm. 3. There is gas within the urinary bladder which is presumably from recent instrumentation. 4. Bibasilar atelectasis, right worse than left. 5. Punctate nonobstructing stone in the interpolar region of the left kidney. Aortic Atherosclerosis (ICD10-I70.0). Electronically Signed   By: Constance Holster M.D.   On: 11/17/2020 20:00     Medical Consultants:    None.  Anti-Infectives:   none  Subjective:    Chloe Young tolerating her diet well no further abdominal pain.  Objective:    Vitals:   11/18/20 1636 11/18/20 2023 11/18/20 2136 11/19/20 0439  BP: (!) 118/46 (!) 146/66 (!) 143/54   Pulse: 92 90 (!) 123   Resp: 16 18    Temp: 97.8 F (36.6 C) 99.2 F (37.3 C)    TempSrc:  Oral    SpO2: 96% 95%    Weight:    65 kg   Height:       SpO2: 95 % O2 Flow Rate (L/min): 1 L/min   Intake/Output Summary (Last 24 hours) at 11/19/2020 0725 Last data filed at 11/19/2020 0030 Gross per 24 hour  Intake 610 ml  Output 310 ml  Net 300 ml   Filed Weights   11/17/20 0500 11/18/20 0359 11/19/20 0439  Weight: 71.3 kg 68.7 kg 65 kg    Exam: General exam: In no acute distress. Respiratory system: Good air movement and clear to auscultation. Cardiovascular system: S1 & S2 heard, RRR. No JVD. Gastrointestinal system: Abdomen is nondistended, soft and nontender.  Extremities: No pedal edema. Skin: No rashes, lesions or ulcers  Data Reviewed:    Labs: Basic Metabolic Panel: Recent Labs  Lab 11/13/20 0410 11/13/20 0410 11/14/20 0645 11/14/20 0645 11/17/20 0744 11/19/20 0145  NA 136  --  137  --  134* 133*  K 3.7   < > 4.4   < > 3.9 4.1  CL 104  --  103  --  97* 97*  CO2 24  --  25  --  24 27  GLUCOSE 133*  --  118*  --  121* 116*  BUN 8  --  9  --  8 6*  CREATININE 0.40*  --  0.44  --  0.53 0.52  CALCIUM 8.2*  --  8.4*  --  8.8* 8.9  MG 1.8  --  2.1  --   --  2.3  PHOS 2.6  --   --   --   --   --    < > = values in this interval not displayed.   GFR Estimated Creatinine Clearance: 59.3 mL/min (by C-G formula based on SCr of 0.52 mg/dL). Liver Function Tests: Recent Labs  Lab 11/13/20 0410  AST 40  ALT 35  ALKPHOS 68  BILITOT 0.6  PROT 5.0*  ALBUMIN 2.0*   No results for input(s): LIPASE, AMYLASE in the last 168 hours. No results for input(s): AMMONIA in the last 168 hours. Coagulation profile No results for input(s): INR, PROTIME in the last 168 hours. COVID-19 Labs  No results for input(s): DDIMER, FERRITIN, LDH, CRP in the last 72 hours.  Lab Results  Component Value Date   SARSCOV2NAA NEGATIVE 11/16/2020   [place] NEGATIVE 11/08/2020    CBC: Recent Labs  Lab 11/13/20 0410 11/15/20 1543 11/17/20 0744 11/19/20 0145  WBC 8.7 10.3 12.3* 11.6*  HGB 8.8* 9.6* 8.5*  8.3*  HCT 26.6* 29.3* 26.2* 25.6*  MCV 95.3 93.6 94.2 95.9  PLT 277 453* 514* 601*   Cardiac Enzymes: No results for input(s): CKTOTAL, CKMB, CKMBINDEX, TROPONINI in the last 168 hours. BNP (last 3 results) No results for input(s): PROBNP in the last 8760 hours. CBG: Recent Labs  Lab 11/15/20 1112 11/15/20 1604 11/16/20 0835 11/16/20 1151 11/16/20 1621  GLUCAP 98 97 95 103* 117*   D-Dimer: No results for input(s): DDIMER in the last 72 hours. Hgb A1c: No results for input(s): HGBA1C in the last 72 hours. Lipid Profile: No results for input(s): CHOL, HDL, LDLCALC, TRIG, CHOLHDL, LDLDIRECT in the last 72 hours. Thyroid function studies: No results for input(s): TSH, T4TOTAL, T3FREE, THYROIDAB in the last 72 hours.  Invalid input(s): FREET3 Anemia work up: No results for input(s): VITAMINB12, FOLATE, FERRITIN, TIBC, IRON, RETICCTPCT in the last 72 hours. Sepsis Labs: Recent Labs  Lab 11/13/20 0410 11/15/20 1543 11/17/20 0744 11/19/20 0145  WBC 8.7 10.3 12.3* 11.6*   Microbiology Recent Results (from the past 240 hour(s))  Urine culture     Status: None   Collection Time: 11/09/20  1:28 PM   Specimen: In/Out Cath Urine  Result Value Ref Range Status   Specimen Description IN/OUT CATH URINE  Final   Special Requests NONE  Final   Culture   Final    NO GROWTH Performed at [facility], [place] [address], [place], [place] [address]    Report Status 11/10/2020 FINAL  Final  SARS Coronavirus 2 by RT PCR (hospital order, performed in [facility] hospital lab) Nasopharyngeal Nasopharyngeal Swab     Status: None   Collection Time: 11/16/20  4:35 PM   Specimen: Nasopharyngeal Swab  Result Value Ref Range Status   SARS Coronavirus 2 NEGATIVE NEGATIVE Final    Comment: (NOTE) SARS-CoV-2 target nucleic acids are NOT DETECTED.  The SARS-CoV-2 RNA is generally detectable in upper and lower respiratory specimens during the acute phase of infection. The  lowest concentration of SARS-CoV-2 viral copies this assay can detect is 250 copies / mL. A negative result does not preclude SARS-CoV-2 infection and should not be used as the sole basis for treatment or other patient management decisions.  A negative result may occur with improper specimen collection / handling, submission of specimen other than nasopharyngeal swab,  presence of viral mutation(s) within the areas targeted by this assay, and inadequate number of viral copies (<250 copies / mL). A negative result must be combined with clinical observations, patient history, and epidemiological information.  Fact Sheet for Patients:   StrictlyIdeas.no  Fact Sheet for Healthcare Providers: BankingDealers.co.za  This test is not yet approved or  cleared by the [place] FDA and has been authorized for detection and/or diagnosis of SARS-CoV-2 by FDA under an Emergency Use Authorization (EUA).  This EUA will remain in effect (meaning this test can be used) for the duration of the COVID-19 declaration under Section 564(b)(1) of the Act, 21 U.S.C. section 360bbb-3(b)(1), unless the authorization is terminated or revoked sooner.  Performed at [facility], [place] [address], [place], [place] [address]      Medications:   . acetaminophen  1,000 mg Oral Q6H  . amoxicillin-clavulanate  1 tablet Oral Q12H  . calcium-vitamin D  1 tablet Oral Daily  . Chlorhexidine Gluconate Cloth  6 each Topical Daily  . enoxaparin (LOVENOX) injection  40 mg Subcutaneous Daily  . feeding supplement  237 mL Oral TID BM  . fluticasone  1 spray Each Nare Daily  . folic acid  1 mg Oral QHS  . hydrochlorothiazide  12.5 mg Oral Daily  . methocarbamol  1,000 mg Oral TID  . metoprolol tartrate  25 mg Oral BID  . multivitamin with minerals  1 tablet Oral QHS  . pantoprazole  40 mg Oral QHS  . pregabalin  150 mg Oral BID  . rosuvastatin  5 mg Oral Daily  .  sodium chloride flush  10-40 mL Intracatheter Q12H  . thiamine  100 mg Oral QHS   Continuous Infusions:    LOS: 11 days   Charlynne Cousins  [facility]  11/19/2020, 7:25 AM

## 2020-11-19 NOTE — Progress Notes (Signed)
Progress Note  11 Days Post-Op  Subjective: NO acute change. Some stable pressure sensation in lower abdomen  Objective: Vital signs in last 24 hours: Temp:  [97.6 F (36.4 C)-99.2 F (37.3 C)] 97.6 F (36.4 C) (11/26 0831) Pulse Rate:  [79-123] 100 (11/26 0831) Resp:  [16-18] 18 (11/26 0831) BP: (118-146)/(46-75) 134/75 (11/26 0831) SpO2:  [95 %-96 %] 96 % (11/26 0831) Weight:  [65 kg] 65 kg (11/26 0439) Last BM Date: 11/18/20  Intake/Output from previous day: 11/25 0701 - 11/26 0700 In: 610 [P.O.:600; I.V.:10] Out: 310 [Stool:310] Intake/Output this shift: Total I/O In: 180 [P.O.:180] Out: -   PE: General: pleasant, WD,WNfemale Heart:RRR. Palpable radial and pedal pulses bilaterally Lungs: CTAB, no wheezes, rhonchi, or rales noted. Respiratory effort nonlabored Abd: soft,appropriately ttp, not distended, midline woundclean and dry with early granulation, small amount of necrotic fat at the base, no significant drainage or exudate on the previous dressing MS:RUE working on mobility   Lab Results:  Recent Labs    11/17/20 0744 11/19/20 0145  WBC 12.3* 11.6*  HGB 8.5* 8.3*  HCT 26.2* 25.6*  PLT 514* 601*   BMET Recent Labs    11/17/20 0744 11/19/20 0145  NA 134* 133*  K 3.9 4.1  CL 97* 97*  CO2 24 27  GLUCOSE 121* 116*  BUN 8 6*  CREATININE 0.53 0.52  CALCIUM 8.8* 8.9   PT/INR No results for input(s): LABPROT, INR in the last 72 hours. CMP     Component Value Date/Time   NA 133 (L) 11/19/2020 0145   K 4.1 11/19/2020 0145   CL 97 (L) 11/19/2020 0145   CO2 27 11/19/2020 0145   GLUCOSE 116 (H) 11/19/2020 0145   BUN 6 (L) 11/19/2020 0145   CREATININE 0.52 11/19/2020 0145   CALCIUM 8.9 11/19/2020 0145   PROT 5.0 (L) 11/13/2020 0410   ALBUMIN 2.0 (L) 11/13/2020 0410   AST 40 11/13/2020 0410   ALT 35 11/13/2020 0410   ALKPHOS 68 11/13/2020 0410   BILITOT 0.6 11/13/2020 0410   GFRNONAA >60 11/19/2020 0145   GFRAA >90 01/15/2012 0344    Lipase     Component Value Date/Time   LIPASE 25 11/08/2020 1236       Studies/Results: CT ABDOMEN PELVIS W CONTRAST  Result Date: 11/17/2020 CLINICAL DATA:  Abdominal abscess. EXAM: CT ABDOMEN AND PELVIS WITH CONTRAST TECHNIQUE: Multidetector CT imaging of the abdomen and pelvis was performed using the standard protocol following bolus administration of intravenous contrast. CONTRAST:  143mL OMNIPAQUE IOHEXOL 300 MG/ML  SOLN COMPARISON:  CT dated November 08, 2020 FINDINGS: Lower chest: Bibasilar atelectasis is noted, right worse than left.The heart size is normal. Hepatobiliary: The liver is normal. Normal gallbladder.There is no biliary ductal dilation. Pancreas: Normal contours without ductal dilatation. No peripancreatic fluid collection. Spleen: Unremarkable. Adrenals/Urinary Tract: --Adrenal glands: Unremarkable. --Right kidney/ureter: No hydronephrosis or radiopaque kidney stones. --Left kidney/ureter: There is a punctate nonobstructing stone in the interpolar region of the left kidney. --Urinary bladder: There is gas within the urinary bladder which is presumably from recent instrumentation. Stomach/Bowel: --Stomach/Duodenum: No hiatal hernia or other gastric abnormality. Normal duodenal course and caliber. --Small bowel: There are mildly hyperenhancing loops of small bowel in the low midline pelvis, likely reactive. 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 The colostomy is unremarkable. There is no evidence for large bowel obstruction. Oral contrast is noted within the colon. --Appendix: Normal. Vascular/Lymphatic:  Atherosclerotic calcification is present within the non-aneurysmal abdominal aorta, without hemodynamically significant stenosis. --No retroperitoneal lymphadenopathy. --No mesenteric lymphadenopathy. --there are few prominent lymph nodes along the left pelvic sidewall, likely reactive. Reproductive:  Status post hysterectomy. No adnexal mass. Other: Small developing abscesses are noted in the patient's pelvis measuring up to approximately 2.8 cm. There are few pockets of extraluminal gas in the patient's pelvis likely postsurgical in etiology. There are phlegm a tori changes in the patient's low abdomen and pelvis, likely postsurgical. There is an open midline incision. 2 Musculoskeletal. No acute displaced fractures. IMPRESSION: 1. Postsurgical changes of the abdomen as detailed above. 2. Small developing abscesses are noted in the patient's pelvis measuring up to approximately 2.8 cm. 3. There is gas within the urinary bladder which is presumably from recent instrumentation. 4. Bibasilar atelectasis, right worse than left. 5. Punctate nonobstructing stone in the interpolar region of the left kidney. Aortic Atherosclerosis (ICD10-I70.0). Electronically Signed   By: Constance Holster M.D.   On: 11/17/2020 20:00    Anti-infectives: Anti-infectives (From admission, onward)   Start     Dose/Rate Route Frequency Ordered Stop   11/18/20 1500  piperacillin-tazobactam (ZOSYN) IVPB 3.375 g  Status:  Discontinued        3.375 g 12.5 mL/hr over 240 Minutes Intravenous Every 8 hours 11/18/20 0757 11/18/20 0938   11/18/20 1030  amoxicillin-clavulanate (AUGMENTIN) 875-125 MG per tablet 1 tablet        1 tablet Oral Every 12 hours 11/18/20 0938     11/18/20 1000  amoxicillin-clavulanate (AUGMENTIN) 875-125 MG per tablet 1 tablet  Status:  Discontinued        1 tablet Oral Every 12 hours 11/18/20 0656 11/18/20 0757   11/18/20 0845  piperacillin-tazobactam (ZOSYN) IVPB 3.375 g        3.375 g 100 mL/hr over 30 Minutes Intravenous  Once 11/18/20 0757 11/18/20 0933   11/09/20 2330  piperacillin-tazobactam (ZOSYN) IVPB 3.375 g       "Followed by" Linked Group Details   3.375 g 12.5 mL/hr over 240 Minutes Intravenous Every 8 hours 11/08/20 1704 11/14/20 2019   11/08/20 2200  ceFEPIme (MAXIPIME) 2 g in sodium  chloride 0.9 % 100 mL IVPB  Status:  Discontinued        2 g 200 mL/hr over 30 Minutes Intravenous Every 12 hours 11/08/20 1335 11/08/20 1638   11/08/20 1830  piperacillin-tazobactam (ZOSYN) IVPB 3.375 g        3.375 g 12.5 mL/hr over 240 Minutes Intravenous To Surgery 11/08/20 1823 11/09/20 1830   11/08/20 1715  piperacillin-tazobactam (ZOSYN) IVPB 3.375 g       "Followed by" Linked Group Details   3.375 g 100 mL/hr over 30 Minutes Intravenous  Once 11/08/20 1704 11/13/20 1030   11/08/20 1245  ceFEPIme (MAXIPIME) 2 g in sodium chloride 0.9 % 100 mL IVPB        2 g 200 mL/hr over 30 Minutes Intravenous  Once 11/08/20 1243 11/08/20 1338       Assessment/Plan HTN HLD Chronic pain Recent hx of R humerus fracture -Dr. 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 Barry Dienes - POD#11 -Continue damp to dry dressing changes to midline wound - Antibioticsstopped 11/21, repeat CT 11/24 with small pelvic abscesses. Too small to drain. WBC 11 (downtrending), no fever, generally feeling well. Continue abx (augmentin) and continue to monitor. May be ready for DC in next 1-2 days depending on clinical  course.   FEN: reg VTE: lovenox ID: cefepime 11/15; Zosyn 11/15>11/21, augmentin 11/25->  LOS: 11 days    [facility] 11/19/2020, 9:04 AM Please see Amion for pager number during day hours 7:00am-4:30pm

## 2020-11-19 NOTE — Progress Notes (Signed)
Physical Therapy Treatment Patient Details Name: Chloe Young MRN: [id number] DOB: [birth year] Today's Date: 11/19/2020    History of Present Illness Pt adm 11/15 with sepsis due to acute bowel perforation. Had 2 recent falls at home and has rt humerus fracture due to fall. 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     PT Comments    Pt mobilizing well with PT in hallway today, continues to require sequencing assist with SPC during ambulation. Pt with increasing abdominal pain during mobility, RN notified. PT to continue to recommend SNF level of care post-acutely to return to PLOF before d/c home.     Follow Up Recommendations  SNF     Equipment Recommendations  None recommended by PT    Recommendations for Other Services       Precautions / Restrictions Precautions Precautions: Fall;Shoulder Shoulder Interventions: Shoulder sling/immobilizer Precaution Comments: Per Dr. Veverly Fells' note 11/17, pt to keep sling on at all times with no ROM Rt shoulder, arm across body  Required Braces or Orthoses: Sling Restrictions Weight Bearing Restrictions: No    Mobility  Bed Mobility               General bed mobility comments: OOB in recliner   Transfers Overall transfer level: Needs assistance Equipment used: Straight cane Transfers: Sit to/from Stand Sit to Stand: Min assist         General transfer comment: min assist to steady, STS x2 from recliner and toilet.  Ambulation/Gait Ambulation/Gait assistance: Min guard Gait Distance (Feet): 125 Feet Assistive device: Straight cane Gait Pattern/deviations: Step-through pattern;Decreased stride length;Trunk flexed Gait velocity: decr   General Gait Details: for safety, verbal cuing for sequencing cane   Stairs             Wheelchair Mobility    Modified Rankin (Stroke Patients Only)       Balance Overall  balance assessment: Needs assistance Sitting-balance support: No upper extremity supported Sitting balance-Leahy Scale: Fair     Standing balance support: Single extremity supported Standing balance-Leahy Scale: Poor Standing balance comment: requires UE support                             Cognition Arousal/Alertness: Awake/alert Behavior During Therapy: WFL for tasks assessed/performed Overall Cognitive Status: Within Functional Limits for tasks assessed                                 General Comments: alert and oriented, able to follow all instructions      Exercises      General Comments        Pertinent Vitals/Pain Pain Assessment: Faces Faces Pain Scale: Hurts little more Pain Location: abdomen, RUE when propping on pillow in recliner Pain Descriptors / Indicators: Sore;Guarding Pain Intervention(s): Limited activity within patient's tolerance;Monitored during session;Repositioned    Home Living                      Prior Function            PT Goals (current goals can now be found in the care plan section) Acute Rehab PT Goals Patient Stated Goal: be independent again PT Goal Formulation: With patient Time For Goal Achievement: 11/23/20 Potential to Achieve Goals: Good Progress towards PT goals: Progressing toward  goals    Frequency    Min 3X/week      PT Plan Current plan remains appropriate    Co-evaluation              AM-PAC PT "6 Clicks" Mobility   Outcome Measure  Help needed turning from your back to your side while in a flat bed without using bedrails?: A Little Help needed moving from lying on your back to sitting on the side of a flat bed without using bedrails?: A Little Help needed moving to and from a bed to a chair (including a wheelchair)?: A Little Help needed standing up from a chair using your arms (e.g., wheelchair or bedside chair)?: A Little Help needed to walk in hospital room?: A  Little Help needed climbing 3-5 steps with a railing? : A Little 6 Click Score: 18    End of Session   Activity Tolerance: Patient tolerated treatment well Patient left: with call bell/phone within reach;in chair;Other (comment) (with OT in room, back to back PT/OT) Nurse Communication: Mobility status;Patient requests pain meds PT Visit Diagnosis: Other abnormalities of gait and mobility (R26.89);Muscle weakness (generalized) (M62.81);History of falling (Z91.81)     Time: 1610-9604 PT Time Calculation (min) (ACUTE ONLY): 26 min  Charges:  $Gait Training: 8-22 mins $Therapeutic Activity: 8-22 mins                     Kashawn Manzano E, PT Acute Rehabilitation Services Pager [phone]  Office [phone]    Amarianna Abplanalp D Elonda Husky 11/19/2020, 5:11 PM

## 2020-11-20 DIAGNOSIS — A419 Sepsis, unspecified organism: Secondary | ICD-10-CM | POA: Diagnosis not present

## 2020-11-20 DIAGNOSIS — R198 Other specified symptoms and signs involving the digestive system and abdomen: Secondary | ICD-10-CM | POA: Diagnosis not present

## 2020-11-20 DIAGNOSIS — I10 Essential (primary) hypertension: Secondary | ICD-10-CM | POA: Diagnosis not present

## 2020-11-20 DIAGNOSIS — K659 Peritonitis, unspecified: Secondary | ICD-10-CM | POA: Diagnosis not present

## 2020-11-20 LAB — BASIC METABOLIC PANEL
Anion gap: 10 (ref 5–15)
BUN: 9 mg/dL (ref 8–23)
CO2: 27 mmol/L (ref 22–32)
Calcium: 9.2 mg/dL (ref 8.9–10.3)
Chloride: 96 mmol/L — ABNORMAL LOW (ref 98–111)
Creatinine, Ser: 0.5 mg/dL (ref 0.44–1.00)
GFR, Estimated: >60 mL/min (ref >60)
Glucose, Bld: 106 mg/dL — ABNORMAL HIGH (ref 70–99)
Potassium: 4 mmol/L (ref 3.5–5.1)
Sodium: 133 mmol/L — ABNORMAL LOW (ref 135–145)

## 2020-11-20 LAB — CBC
HCT: 26.8 % — ABNORMAL LOW (ref 36.0–46.0)
Hemoglobin: 8.6 g/dL — ABNORMAL LOW (ref 12.0–15.0)
MCH: 30.5 pg (ref 26.0–34.0)
MCHC: 32.1 g/dL (ref 30.0–36.0)
MCV: 95 fL (ref 80.0–100.0)
Platelets: 620 10*3/uL — ABNORMAL HIGH (ref 150–400)
RBC: 2.82 MIL/uL — ABNORMAL LOW (ref 3.87–5.11)
RDW: 13.4 % (ref 11.5–15.5)
WBC: 8.9 10*3/uL (ref 4.0–10.5)
nRBC: 0 % (ref 0.0–0.2)

## 2020-11-20 MED ORDER — AMOXICILLIN-POT CLAVULANATE 875-125 MG PO TABS: 1.0000 | ORAL_TABLET | Freq: Two times a day (BID) | ORAL | 0 refills | Status: AC

## 2020-11-20 NOTE — Progress Notes (Signed)
Progress Note  12 Days Post-Op  Subjective: Feeling slightly better today, no acute change  Objective: Vital signs in last 24 hours: Temp:  [97.8 F (36.6 C)-99 F (37.2 C)] 99 F (37.2 C) (11/27 0815) Pulse Rate:  [87-98] 87 (11/27 0815) Resp:  [18] 18 (11/26 1543) BP: (94-143)/(65-87) 112/69 (11/27 0815) SpO2:  [96 %-98 %] 96 % (11/26 2344) Last BM Date: 11/18/20  Intake/Output from previous day: 11/26 0701 - 11/27 0700 In: 180 [P.O.:180] Out: -  Intake/Output this shift: No intake/output data recorded.  PE: General: pleasant, WD,WNfemale Heart:RRR. Palpable radial and pedal pulses bilaterally Lungs: CTAB, no wheezes, rhonchi, or rales noted. Respiratory effort nonlabored Abd: soft,appropriately ttp, not distended, midline woundclean and dry with early granulation, small amount of necrotic fat at the base, no significant drainage or exudate on the previous dressing MS:RUE working on mobility   Lab Results:  Recent Labs    11/19/20 0145 11/20/20 0223  WBC 11.6* 8.9  HGB 8.3* 8.6*  HCT 25.6* 26.8*  PLT 601* 620*   BMET Recent Labs    11/19/20 0145 11/20/20 0223  NA 133* 133*  K 4.1 4.0  CL 97* 96*  CO2 27 27  GLUCOSE 116* 106*  BUN 6* 9  CREATININE 0.52 0.50  CALCIUM 8.9 9.2   PT/INR No results for input(s): LABPROT, INR in the last 72 hours. CMP     Component Value Date/Time   NA 133 (L) 11/20/2020 0223   K 4.0 11/20/2020 0223   CL 96 (L) 11/20/2020 0223   CO2 27 11/20/2020 0223   GLUCOSE 106 (H) 11/20/2020 0223   BUN 9 11/20/2020 0223   CREATININE 0.50 11/20/2020 0223   CALCIUM 9.2 11/20/2020 0223   PROT 5.0 (L) 11/13/2020 0410   ALBUMIN 2.0 (L) 11/13/2020 0410   AST 40 11/13/2020 0410   ALT 35 11/13/2020 0410   ALKPHOS 68 11/13/2020 0410   BILITOT 0.6 11/13/2020 0410   GFRNONAA >60 11/20/2020 0223   GFRAA >90 01/15/2012 0344   Lipase     Component Value Date/Time   LIPASE 25 11/08/2020 1236        Studies/Results: No results found.  Anti-infectives: Anti-infectives (From admission, onward)   Start     Dose/Rate Route Frequency Ordered Stop   11/18/20 1500  piperacillin-tazobactam (ZOSYN) IVPB 3.375 g  Status:  Discontinued        3.375 g 12.5 mL/hr over 240 Minutes Intravenous Every 8 hours 11/18/20 0757 11/18/20 0938   11/18/20 1030  amoxicillin-clavulanate (AUGMENTIN) 875-125 MG per tablet 1 tablet        1 tablet Oral Every 12 hours 11/18/20 0938     11/18/20 1000  amoxicillin-clavulanate (AUGMENTIN) 875-125 MG per tablet 1 tablet  Status:  Discontinued        1 tablet Oral Every 12 hours 11/18/20 0656 11/18/20 0757   11/18/20 0845  piperacillin-tazobactam (ZOSYN) IVPB 3.375 g        3.375 g 100 mL/hr over 30 Minutes Intravenous  Once 11/18/20 0757 11/19/20 1648   11/09/20 2330  piperacillin-tazobactam (ZOSYN) IVPB 3.375 g       "Followed by" Linked Group Details   3.375 g 12.5 mL/hr over 240 Minutes Intravenous Every 8 hours 11/08/20 1704 11/14/20 2019   11/08/20 2200  ceFEPIme (MAXIPIME) 2 g in sodium chloride 0.9 % 100 mL IVPB  Status:  Discontinued        2 g 200 mL/hr over 30 Minutes Intravenous Every 12 hours 11/08/20  1335 11/08/20 1638   11/08/20 1830  piperacillin-tazobactam (ZOSYN) IVPB 3.375 g        3.375 g 12.5 mL/hr over 240 Minutes Intravenous To Surgery 11/08/20 1823 11/09/20 1830   11/08/20 1715  piperacillin-tazobactam (ZOSYN) IVPB 3.375 g       "Followed by" Linked Group Details   3.375 g 100 mL/hr over 30 Minutes Intravenous  Once 11/08/20 1704 11/13/20 1030   11/08/20 1245  ceFEPIme (MAXIPIME) 2 g in sodium chloride 0.9 % 100 mL IVPB        2 g 200 mL/hr over 30 Minutes Intravenous  Once 11/08/20 1243 11/08/20 1338       Assessment/Plan HTN HLD Chronic pain Recent hx of R humerus fracture -Dr. 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 Barry Dienes - POD#12 -Continue  damp to dry dressing changes to midline wound - Antibioticsstopped 11/21, repeat CT 11/24 with small pelvic abscesses. Too small to drain. WBC has normalized to 8.9, no fever, generally feeling well. Continue abx (augmentin) for total 14-day course.  Okay for discharge to SNF from surgery standpoint.  FEN: reg VTE: lovenox ID: cefepime 11/15; Zosyn 11/15>11/21, augmentin 11/25->  LOS: 12 days    [facility] 11/20/2020, 8:59 AM Please see Amion for pager number during day hours 7:00am-4:30pm

## 2020-11-20 NOTE — Progress Notes (Signed)
[facility] PROGRESS NOTE    Progress Note  SREEJA SPIES  HDQ:[id number] DOB: June 10, 1951 DOA: 11/08/2020 PCP: Binnie Rail, MD     Brief Narrative:   RAGUEL KOSLOSKI is an 69 y.o. 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  The patient developed postoperative ileus required NG tube and TPN, ileus resolved NG tube removed on 11/13/2020 was allowed a soft diet.  She subsequently started developing a mild fever with purulent drainage on the wound VAC, CT scan of the abdomen and pelvis was done that showed multiple abscesses she was restarted on empiric antibiotics  Assessment/Plan:   Septic shock due to peritonitis in the setting of bowel perforation: Appreciate surgery's assistance, has remained afebrile leukocytosis is resolved. Wound VAC has been removed, she is tolerating her diet denies any abdominal pain. We will need to continue oral antibiotics, duration of antibiotics per surgery. Will consult social worker for skilled nursing facility placement.  She should be ready in 24 hours.  Acute right humeral fracture: In a sling continue pain medication.  Alcohol use: No signs of withdrawal.  Essential hypertension and  hyperlipidemia: Continue current regimen no changes made.   DVT prophylaxis: lovenxo Family Communication:none Status is: Inpatient  Remains inpatient appropriate because:Hemodynamically unstable   Dispo: The patient is from: Home              Anticipated d/c is to: SNF              Anticipated d/c date is: 1 day              Patient currently is not medically stable to d/c.  Will contact social worker as patient can probably be discharged over the next 24 hours.     Code Status:     Code Status Orders  (From admission, onward)         Start     Ordered   11/08/20 1633  Full code  Continuous        11/08/20 1634        Code Status History    This patient has a current code status but no historical code status.   Advance Care Planning Activity        IV Access:    Peripheral IV   Procedures and diagnostic studies:   No results found.   Medical Consultants:    None.  Anti-Infectives:   none  Subjective:    IDALIS HOELTING denies any abdominal pain tolerating her diet well.  Objective:    Vitals:   11/19/20 2033 11/19/20 2344 11/20/20 0452 11/20/20 0454  BP: 94/67 110/65 109/71 109/71  Pulse: 92 87  97  Resp:      Temp: 97.8 F (36.6 C) 98.4 F (36.9 C)  98.6 F (37 C)  TempSrc: Oral Oral  Oral  SpO2: 97% 96%    Weight:      Height:       SpO2: 96 % O2 Flow Rate (L/min): 1 L/min   Intake/Output Summary (Last 24 hours) at 11/20/2020 0722 Last data filed at 11/19/2020 0800 Gross per 24 hour  Intake 180 ml  Output --  Net 180 ml   Filed Weights   11/17/20 0500 11/18/20 0359 11/19/20 0439  Weight: 71.3 kg 68.7 kg 65 kg    Exam: General exam: In no acute distress. Respiratory system: Good air movement and clear to auscultation. Cardiovascular system: S1 & S2 heard, RRR. No JVD. Gastrointestinal system: Abdomen is nondistended, soft and nontender.  Extremities: No pedal edema. Skin: No rashes, lesions or ulcers  Data  Reviewed:    Labs: Basic Metabolic Panel: Recent Labs  Lab 11/14/20 0645 11/14/20 0645 11/17/20 0744 11/17/20 0744 11/19/20 0145 11/20/20 0223  NA 137  --  134*  --  133* 133*  K 4.4   < > 3.9   < > 4.1 4.0  CL 103  --  97*  --  97* 96*  CO2 25  --  24  --  27 27  GLUCOSE 118*  --  121*  --  116* 106*  BUN 9  --  8  --  6* 9  CREATININE 0.44  --  0.53  --  0.52 0.50  CALCIUM 8.4*  --  8.8*  --  8.9 9.2  MG 2.1  --   --   --  2.3  --    < > = values in this interval not displayed.   GFR Estimated Creatinine Clearance: 59.3 mL/min (by C-G formula based on SCr of 0.5 mg/dL). Liver Function Tests: No results for input(s): AST, ALT, ALKPHOS, BILITOT, PROT, ALBUMIN in the last 168 hours. No results for input(s): LIPASE, AMYLASE in the last 168 hours. No results for input(s): AMMONIA in the last 168 hours. Coagulation profile No results for input(s): INR, PROTIME in the last 168 hours. COVID-19 Labs  No results for input(s): DDIMER, FERRITIN, LDH, CRP in the last 72 hours.  Lab Results  Component Value Date   SARSCOV2NAA NEGATIVE 11/16/2020   [place] NEGATIVE 11/08/2020    CBC: Recent Labs  Lab 11/15/20 1543 11/17/20 0744 11/19/20 0145 11/20/20 0223  WBC 10.3 12.3* 11.6* 8.9  HGB 9.6* 8.5* 8.3* 8.6*  HCT 29.3* 26.2* 25.6* 26.8*  MCV 93.6 94.2 95.9 95.0  PLT 453* 514* 601* 620*   Cardiac Enzymes: No results for input(s): CKTOTAL, CKMB, CKMBINDEX, TROPONINI in the last 168 hours. BNP (last 3 results) No results for input(s): PROBNP in the last 8760 hours. CBG: Recent Labs  Lab 11/15/20 1112 11/15/20 1604 11/16/20 0835 11/16/20 1151 11/16/20 1621  GLUCAP 98 97 95 103* 117*   D-Dimer: No results for input(s): DDIMER in the last 72 hours. Hgb A1c: No results for input(s): HGBA1C in the last 72 hours. Lipid Profile: No results for input(s): CHOL, HDL, LDLCALC, TRIG, CHOLHDL, LDLDIRECT in the last 72 hours. Thyroid function studies: No results for  input(s): TSH, T4TOTAL, T3FREE, THYROIDAB in the last 72 hours.  Invalid input(s): FREET3 Anemia work up: No results for input(s): VITAMINB12, FOLATE, FERRITIN, TIBC, IRON, RETICCTPCT in the last 72 hours. Sepsis Labs: Recent Labs  Lab 11/15/20 1543 11/17/20 0744 11/19/20 0145 11/20/20 0223  WBC 10.3 12.3* 11.6* 8.9   Microbiology Recent Results (from the past 240 hour(s))  SARS Coronavirus 2  by RT PCR (hospital order, performed in [facility] hospital lab) Nasopharyngeal Nasopharyngeal Swab     Status: None   Collection Time: 11/16/20  4:35 PM   Specimen: Nasopharyngeal Swab  Result Value Ref Range Status   SARS Coronavirus 2 NEGATIVE NEGATIVE Final    Comment: (NOTE) SARS-CoV-2 target nucleic acids are NOT DETECTED.  The SARS-CoV-2 RNA is generally detectable in upper and lower respiratory specimens during the acute phase of infection. The lowest concentration of SARS-CoV-2 viral copies this assay can detect is 250 copies / mL. A negative result does not preclude SARS-CoV-2 infection and should not be used as the sole basis for treatment or other patient management decisions.  A negative result may occur with improper specimen collection / handling, submission of specimen other than nasopharyngeal swab, presence of viral mutation(s) within the areas targeted by this assay, and inadequate number of viral copies (<250 copies / mL). A negative result must be combined with clinical observations, patient history, and epidemiological information.  Fact Sheet for Patients:   StrictlyIdeas.no  Fact Sheet for Healthcare Providers: BankingDealers.co.za  This test is not yet approved or  cleared by the [place] FDA and has been authorized for detection and/or diagnosis of SARS-CoV-2 by FDA under an Emergency Use Authorization (EUA).  This EUA will remain in effect (meaning this test can be used) for the duration of the COVID-19  declaration under Section 564(b)(1) of the Act, 21 U.S.C. section 360bbb-3(b)(1), unless the authorization is terminated or revoked sooner.  Performed at [facility], [place] [address] [place], [place] [address]   Culture, blood (Routine X 2) w Reflex to ID Panel     Status: None (Preliminary result)   Collection Time: 11/18/20  7:30 AM   Specimen: BLOOD RIGHT HAND  Result Value Ref Range Status   Specimen Description BLOOD RIGHT HAND  Final   Special Requests   Final    BOTTLES DRAWN AEROBIC ONLY Blood Culture adequate volume   Culture   Final    NO GROWTH 2 DAYS Performed at [facility], [place] [address] [place], [place] [address]    Report Status PENDING  Incomplete  Culture, blood (Routine X 2) w Reflex to ID Panel     Status: None (Preliminary result)   Collection Time: 11/18/20  7:31 AM   Specimen: BLOOD LEFT HAND  Result Value Ref Range Status   Specimen Description BLOOD LEFT HAND  Final   Special Requests   Final    BOTTLES DRAWN AEROBIC ONLY Blood Culture adequate volume   Culture   Final    NO GROWTH 2 DAYS Performed at [facility], [place] [address], [place],  [address]    Report Status PENDING  Incomplete     Medications:    acetaminophen  1,000 mg Oral Q6H   amoxicillin-clavulanate  1 tablet Oral Q12H   calcium-vitamin D  1 tablet Oral Daily   Chlorhexidine Gluconate Cloth  6 each Topical Daily   enoxaparin (LOVENOX) injection  40 mg Subcutaneous Daily   feeding supplement  237 mL Oral TID BM   fluticasone  1 spray Each Nare Daily   folic acid  1 mg Oral QHS   hydrochlorothiazide  12.5 mg Oral Daily   methocarbamol  1,000 mg Oral TID   metoprolol tartrate  25 mg Oral BID   multivitamin with minerals  1 tablet Oral QHS   pantoprazole  40 mg Oral QHS   pregabalin  150 mg Oral BID   rosuvastatin  5 mg Oral Daily   sodium chloride flush  10-40 mL Intracatheter Q12H   thiamine  100 mg Oral QHS   Continuous  Infusions:    LOS: 12 days   Charlynne Cousins  [facility]  11/20/2020, 7:22 AM

## 2020-11-21 DIAGNOSIS — K659 Peritonitis, unspecified: Secondary | ICD-10-CM | POA: Diagnosis not present

## 2020-11-21 DIAGNOSIS — A419 Sepsis, unspecified organism: Secondary | ICD-10-CM | POA: Diagnosis not present

## 2020-11-21 DIAGNOSIS — I10 Essential (primary) hypertension: Secondary | ICD-10-CM | POA: Diagnosis not present

## 2020-11-21 DIAGNOSIS — K56600 Partial intestinal obstruction, unspecified as to cause: Secondary | ICD-10-CM | POA: Diagnosis not present

## 2020-11-21 LAB — GLUCOSE, CAPILLARY: Glucose-Capillary: 87 mg/dL (ref 70–99)

## 2020-11-21 MED ORDER — TOPIRAMATE 100 MG PO TABS
100.0000 mg | ORAL_TABLET | Freq: Every day | ORAL | Status: DC
  Administered 2020-11-21 – 2020-11-22 (×2): 100 mg via ORAL
  Filled 2020-11-21 (×2): qty 1

## 2020-11-21 MED ORDER — HYDROXYZINE HCL 10 MG PO TABS
10.0000 mg | ORAL_TABLET | Freq: Three times a day (TID) | ORAL | Status: DC | PRN
  Administered 2020-11-23: 10 mg via ORAL
  Filled 2020-11-21 (×3): qty 1

## 2020-11-21 NOTE — Progress Notes (Signed)
Progress Note  13 Days Post-Op  Subjective: No acute change.  States she did not see anybody from case management yesterday.  Objective: Vital signs in last 24 hours: Temp:  [98.1 F (36.7 C)-98.9 F (37.2 C)] 98.4 F (36.9 C) (11/28 0744) Pulse Rate:  [80-92] 91 (11/28 0744) Resp:  [16-20] 20 (11/28 0744) BP: (97-115)/(59-73) 115/73 (11/28 0744) SpO2:  [94 %-100 %] 94 % (11/28 0744) Weight:  [69.5 kg] 69.5 kg (11/28 0700) Last BM Date: 11/20/20  Intake/Output from previous day: 11/27 0701 - 11/28 0700 In: 1120 [P.O.:1120] Out: 100 [Stool:100] Intake/Output this shift: Total I/O In: 360 [P.O.:360] Out: -   PE: General: pleasant, WD,WNfemale Heart:RRR. Palpable radial and pedal pulses bilaterally Lungs: CTAB, no wheezes, rhonchi, or rales noted. Respiratory effort nonlabored Abd: soft,appropriately ttp, not distended, dressing dry and intact, stoma pink and productive MS:RUE in sling   Lab Results:  Recent Labs    11/19/20 0145 11/20/20 0223  WBC 11.6* 8.9  HGB 8.3* 8.6*  HCT 25.6* 26.8*  PLT 601* 620*   BMET Recent Labs    11/19/20 0145 11/20/20 0223  NA 133* 133*  K 4.1 4.0  CL 97* 96*  CO2 27 27  GLUCOSE 116* 106*  BUN 6* 9  CREATININE 0.52 0.50  CALCIUM 8.9 9.2   PT/INR No results for input(s): LABPROT, INR in the last 72 hours. CMP     Component Value Date/Time   NA 133 (L) 11/20/2020 0223   K 4.0 11/20/2020 0223   CL 96 (L) 11/20/2020 0223   CO2 27 11/20/2020 0223   GLUCOSE 106 (H) 11/20/2020 0223   BUN 9 11/20/2020 0223   CREATININE 0.50 11/20/2020 0223   CALCIUM 9.2 11/20/2020 0223   PROT 5.0 (L) 11/13/2020 0410   ALBUMIN 2.0 (L) 11/13/2020 0410   AST 40 11/13/2020 0410   ALT 35 11/13/2020 0410   ALKPHOS 68 11/13/2020 0410   BILITOT 0.6 11/13/2020 0410   GFRNONAA >60 11/20/2020 0223   GFRAA >90 01/15/2012 0344   Lipase     Component Value Date/Time   LIPASE 25 11/08/2020 1236       Studies/Results: No results  found.  Anti-infectives: Anti-infectives (From admission, onward)   Start     Dose/Rate Route Frequency Ordered Stop   11/20/20 0000  amoxicillin-clavulanate (AUGMENTIN) 875-125 MG tablet        1 tablet Oral Every 12 hours 11/20/20 0902 12/02/20 2359   11/18/20 1500  piperacillin-tazobactam (ZOSYN) IVPB 3.375 g  Status:  Discontinued        3.375 g 12.5 mL/hr over 240 Minutes Intravenous Every 8 hours 11/18/20 0757 11/18/20 0938   11/18/20 1030  amoxicillin-clavulanate (AUGMENTIN) 875-125 MG per tablet 1 tablet        1 tablet Oral Every 12 hours 11/18/20 0938     11/18/20 1000  amoxicillin-clavulanate (AUGMENTIN) 875-125 MG per tablet 1 tablet  Status:  Discontinued        1 tablet Oral Every 12 hours 11/18/20 0656 11/18/20 0757   11/18/20 0845  piperacillin-tazobactam (ZOSYN) IVPB 3.375 g        3.375 g 100 mL/hr over 30 Minutes Intravenous  Once 11/18/20 0757 11/19/20 1648   11/09/20 2330  piperacillin-tazobactam (ZOSYN) IVPB 3.375 g       "Followed by" Linked Group Details   3.375 g 12.5 mL/hr over 240 Minutes Intravenous Every 8 hours 11/08/20 1704 11/14/20 2019   11/08/20 2200  ceFEPIme (MAXIPIME) 2 g in sodium  chloride 0.9 % 100 mL IVPB  Status:  Discontinued        2 g 200 mL/hr over 30 Minutes Intravenous Every 12 hours 11/08/20 1335 11/08/20 1638   11/08/20 1830  piperacillin-tazobactam (ZOSYN) IVPB 3.375 g        3.375 g 12.5 mL/hr over 240 Minutes Intravenous To Surgery 11/08/20 1823 11/09/20 1830   11/08/20 1715  piperacillin-tazobactam (ZOSYN) IVPB 3.375 g       "Followed by" Linked Group Details   3.375 g 100 mL/hr over 30 Minutes Intravenous  Once 11/08/20 1704 11/13/20 1030   11/08/20 1245  ceFEPIme (MAXIPIME) 2 g in sodium chloride 0.9 % 100 mL IVPB        2 g 200 mL/hr over 30 Minutes Intravenous  Once 11/08/20 1243 11/08/20 1338       Assessment/Plan HTN HLD Chronic pain Recent hx of R humerus fracture -Dr. Veverly Fells recommends f/u in 2  weeks  Perforated sigmoid diverticulitis  S/p Hartmann's procedure 11/08/20 Dr. Barry Dienes - POD#13 -Continue damp to dry dressing changes to midline wound - Antibioticsstopped 11/21, repeat CT 11/24 with small pelvic abscesses. Too small to drain. WBC has normalized to 8.9, no fever, generally feeling well. Continue abx (augmentin) for total 14-day course.  Okay for discharge to SNF from surgery standpoint.  FEN: reg VTE: lovenox ID: cefepime 11/15; Zosyn 11/15>11/21, augmentin 11/25->  LOS: 13 days    [facility] 11/21/2020, 9:36 AM Please see Amion for pager number during day hours 7:00am-4:30pm

## 2020-11-21 NOTE — Progress Notes (Signed)
Orthopedic Tech Progress Note Patient Details:  Chloe Young Jul 28, 1951 [id number] Overhead Trapeze bar could not be applied to patients hospital bed because it does not have a compatible insert for the bar.  Patient ID: Chloe Young, female   DOB: [birth year], 69 y.o.   MRN: [id number]   Tammy Sours 11/21/2020, 4:14 PM

## 2020-11-21 NOTE — Progress Notes (Signed)
**Note De-Young via Obfuscation** [facility] PROGRESS NOTE    Progress Note  Chloe Young  ERD:[id number] DOB: 06-Feb-1951 DOA: 11/08/2020 PCP: Binnie Rail, MD     Brief Narrative:   Chloe Young is an 69 y.o. 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  The patient developed postoperative ileus required NG tube and TPN, ileus resolved NG tube removed on 11/13/2020 was allowed a soft diet.  She subsequently started developing a mild fever with purulent drainage on the wound VAC, CT scan of the abdomen and pelvis was done that showed multiple abscesses she was restarted on empiric antibiotics  Assessment/Plan:   Septic shock due to peritonitis in the setting of bowel perforation: Appreciate surgery's assistance, has remained afebrile leukocytosis is resolved. She will need to continue empiric Augmentin for 14 days follow-up with surgery in 2 weeks. Patient is medically stable to be transferred to skilled nursing facility.  Awaiting placement.  Acute right humeral fracture: In a sling continue pain medication. Follow-up with orthopedic surgery as an outpatient.  Alcohol use: Chloe signs of withdrawal.  Essential hypertension and hyperlipidemia: Continue current regimen Chloe  changes made.   DVT prophylaxis: lovenxo Family Communication:none Status is: Inpatient  Remains inpatient appropriate because:Hemodynamically unstable   Dispo: The patient is from: Home              Anticipated d/c is to: SNF              Anticipated d/c date is: 1 day              Patient currently is not medically stable to d/c.  Will contact social worker as patient can probably be discharged over the next 24 hours.     Code Status:     Code Status Orders  (From admission, onward)         Start     Ordered   11/08/20 1633  Full code  Continuous        11/08/20 1634        Code Status History    This patient has a current code status but Chloe historical code status.   Advance Care Planning Activity        IV Access:    Peripheral IV   Procedures and diagnostic studies:   Chloe results found.   Medical Consultants:    None.  Anti-Infectives:   none  Subjective:    [name] Chloe complaints tolerating her diet.  Objective:    Vitals:   11/21/20 0032 11/21/20 0358 11/21/20 0700 11/21/20 0744  BP: 97/60 108/64  115/73  Pulse: 92 90  91  Resp: 16 18  20   Temp: 98.1 F (36.7 C) 98.9 F (37.2 C)  98.4 F (36.9 C)  TempSrc: Oral Oral    SpO2: 94% 94%  94%  Weight:   69.5 kg   Height:       SpO2: 94 % O2 Flow Rate (L/min): 1 L/min   Intake/Output Summary (Last 24 hours) at 11/21/2020 0747 Last data filed at 11/21/2020 0700 Gross per 24 hour  Intake 1120 ml  Output 100 ml  Net 1020 ml   Filed Weights   11/18/20 0359 11/19/20 0439 11/21/20 0700  Weight: 68.7 kg 65 kg 69.5 kg    Exam: General exam: In Chloe acute distress. Respiratory system: Good air movement and clear to auscultation. Cardiovascular system: S1 & S2 heard, RRR. Chloe JVD. Gastrointestinal system: Abdomen is nondistended, soft and nontender.  Extremities: Chloe pedal edema. Skin: Chloe rashes, lesions or ulcers  Data Reviewed:    Labs: Basic Metabolic  Panel: Recent Labs  Lab 11/17/20 0744 11/17/20 0744 11/19/20 0145 11/20/20 0223  NA 134*  --  133* 133*  K 3.9   < > 4.1 4.0  CL 97*  --  97* 96*  CO2 24  --  27 27  GLUCOSE 121*  --  116* 106*  BUN 8  --  6* 9  CREATININE 0.53  --  0.52 0.50  CALCIUM 8.8*  --  8.9 9.2  MG  --   --  2.3  --    < > = values in this interval not displayed.   GFR Estimated Creatinine Clearance: 61.2 mL/min (by C-G formula based on SCr of 0.5 mg/dL). Liver Function Tests: Chloe results for input(s): AST, ALT, ALKPHOS, BILITOT, PROT, ALBUMIN in the last 168 hours. Chloe results for input(s): LIPASE, AMYLASE in the last 168 hours. Chloe results for input(s): AMMONIA in the last 168 hours. Coagulation profile Chloe results for input(s): INR, PROTIME in the last 168 hours. COVID-19 Labs  Chloe results for input(s): DDIMER, FERRITIN, LDH, CRP in the last 72 hours.  Lab Results  Component Value Date   SARSCOV2NAA NEGATIVE 11/16/2020   [place] NEGATIVE 11/08/2020    CBC: Recent Labs  Lab 11/15/20 1543 11/17/20 0744 11/19/20 0145 11/20/20 0223  WBC 10.3 12.3* 11.6* 8.9  HGB 9.6* 8.5* 8.3* 8.6*  HCT 29.3* 26.2* 25.6* 26.8*  MCV 93.6 94.2 95.9 95.0  PLT 453* 514* 601* 620*   Cardiac Enzymes: Chloe results for input(s): CKTOTAL, CKMB, CKMBINDEX, TROPONINI in the last 168 hours. BNP (last 3 results) Chloe results for input(s): PROBNP in the last 8760 hours. CBG: Recent Labs  Lab 11/15/20 1112 11/15/20 1604 11/16/20 0835 11/16/20 1151 11/16/20 1621  GLUCAP 98 97 95 103* 117*   D-Dimer: Chloe results for input(s): DDIMER in the last 72 hours. Hgb A1c: Chloe results for input(s): HGBA1C in the last 72 hours. Lipid Profile: Chloe results for input(s): CHOL, HDL, LDLCALC, TRIG, CHOLHDL, LDLDIRECT in the last 72 hours. Thyroid function studies: Chloe results for input(s): TSH, T4TOTAL, T3FREE, THYROIDAB in the last 72 hours.  Invalid input(s): FREET3 Anemia work up: Chloe results for input(s): VITAMINB12, FOLATE,  FERRITIN, TIBC, IRON, RETICCTPCT in the last 72 hours. Sepsis Labs: Recent Labs  Lab 11/15/20 1543 11/17/20 0744 11/19/20 0145 11/20/20 0223  WBC 10.3 12.3* 11.6* 8.9   Microbiology Recent Results (from the past 240 hour(s))  SARS Coronavirus 2 by RT PCR (hospital order, performed in [facility] hospital lab) Nasopharyngeal Nasopharyngeal Swab     Status: None   Collection Time: 11/16/20  4:35 PM   Specimen: Nasopharyngeal Swab  Result Value Ref Range Status   SARS Coronavirus 2 NEGATIVE NEGATIVE Final  Comment: (NOTE) SARS-CoV-2 target nucleic acids are NOT DETECTED.  The SARS-CoV-2 RNA is generally detectable in upper and lower respiratory specimens during the acute phase of infection. The lowest concentration of SARS-CoV-2 viral copies this assay can detect is 250 copies / mL. A negative result does not preclude SARS-CoV-2 infection and should not be used as the sole basis for treatment or other patient management decisions.  A negative result may occur with improper specimen collection / handling, submission of specimen other than nasopharyngeal swab, presence of viral mutation(s) within the areas targeted by this assay, and inadequate number of viral copies (<250 copies / mL). A negative result must be combined with clinical observations, patient history, and epidemiological information.  Fact Sheet for Patients:   StrictlyIdeas.Chloe  Fact Sheet for Healthcare Providers: BankingDealers.co.za  This test is not yet approved or  cleared by the [place] FDA and has been authorized for detection and/or diagnosis of SARS-CoV-2 by FDA under an Emergency Use Authorization (EUA).  This EUA will remain in effect (meaning this test can be used) for the duration of the COVID-19 declaration under Section 564(b)(1) of the Act, 21 U.S.C. section 360bbb-3(b)(1), unless the authorization is terminated or revoked sooner.  Performed  at [facility], [place] [address] [place], [place] [address]   Culture, blood (Routine X 2) w Reflex to ID Panel     Status: None (Preliminary result)   Collection Time: 11/18/20  7:30 AM   Specimen: BLOOD RIGHT HAND  Result Value Ref Range Status   Specimen Description BLOOD RIGHT HAND  Final   Special Requests   Final    BOTTLES DRAWN AEROBIC ONLY Blood Culture adequate volume   Culture   Final    Chloe GROWTH 3 DAYS Performed at [facility], [place] [address], [place], [place] [address]    Report Status PENDING  Incomplete  Culture, blood (Routine X 2) w Reflex to ID Panel     Status: None (Preliminary result)   Collection Time: 11/18/20  7:31 AM   Specimen: BLOOD LEFT HAND  Result Value Ref Range Status   Specimen Description BLOOD LEFT HAND  Final   Special Requests   Final    BOTTLES DRAWN AEROBIC ONLY Blood Culture adequate volume   Culture   Final    Chloe GROWTH 3 DAYS Performed at [facility], [place] [address] [place], [place] [address]    Report Status PENDING  Incomplete     Medications:    acetaminophen  1,000 mg Oral Q6H   amoxicillin-clavulanate  1 tablet Oral Q12H   calcium-vitamin D  1 tablet Oral Daily   Chlorhexidine Gluconate Cloth  6 each Topical Daily   enoxaparin (LOVENOX) injection  40 mg Subcutaneous Daily   feeding supplement  237 mL Oral TID BM   fluticasone  1 spray Each Nare Daily   folic acid  1 mg Oral QHS   hydrochlorothiazide  12.5 mg Oral Daily   methocarbamol  1,000 mg Oral TID   metoprolol tartrate  25 mg Oral BID   multivitamin with minerals  1 tablet Oral QHS   pantoprazole  40 mg Oral QHS   pregabalin  150 mg Oral BID   rosuvastatin  5 mg Oral Daily   sodium chloride flush  10-40 mL Intracatheter Q12H   thiamine  100 mg Oral QHS   Continuous Infusions:    LOS: 13 days   Charlynne Cousins  [facility]  11/21/2020,  7:47 AM

## 2020-11-22 DIAGNOSIS — A419 Sepsis, unspecified organism: Secondary | ICD-10-CM | POA: Diagnosis not present

## 2020-11-22 DIAGNOSIS — I10 Essential (primary) hypertension: Secondary | ICD-10-CM | POA: Diagnosis not present

## 2020-11-22 DIAGNOSIS — K659 Peritonitis, unspecified: Secondary | ICD-10-CM | POA: Diagnosis not present

## 2020-11-22 DIAGNOSIS — R198 Other specified symptoms and signs involving the digestive system and abdomen: Secondary | ICD-10-CM | POA: Diagnosis not present

## 2020-11-22 LAB — SARS CORONAVIRUS 2 BY RT PCR (HOSPITAL ORDER, PERFORMED IN ~~LOC~~ HOSPITAL LAB): SARS Coronavirus 2: NEGATIVE

## 2020-11-22 MED ORDER — DIPHENHYDRAMINE HCL 25 MG PO CAPS
25.0000 mg | ORAL_CAPSULE | Freq: Four times a day (QID) | ORAL | Status: DC | PRN
  Administered 2020-11-22: 25 mg via ORAL
  Filled 2020-11-22: qty 1

## 2020-11-22 NOTE — Discharge Instructions (Signed)
[facility], [place] [phone]  OPEN ABDOMINAL SURGERY: POST OP INSTRUCTIONS  Always review your discharge instruction sheet given to you by the facility where your surgery was performed.  IF YOU HAVE DISABILITY OR FAMILY LEAVE FORMS, YOU MUST BRING THEM TO THE OFFICE FOR PROCESSING.  PLEASE DO NOT GIVE THEM TO YOUR DOCTOR.  1. A prescription for pain medication may be given to you upon discharge.  Take your pain medication as prescribed, if needed.  If narcotic pain medicine is not needed, then you may take acetaminophen (Tylenol) or ibuprofen (Advil) as needed. 2. Take your usually prescribed medications unless otherwise directed. 3. If you need a refill on your pain medication, please contact your pharmacy. They will contact our office to request authorization.  Prescriptions will not be filled after 5pm or on week-ends. 4. You should follow a light diet the first few days after arrival home, such as soup and crackers, pudding, etc.unless your doctor has advised otherwise. A high-fiber, low fat diet can be resumed as tolerated.   Be sure to include lots of fluids daily. Most patients will experience some swelling and bruising on the chest and neck area.  Ice packs will help.  Swelling and bruising can take several days to resolve 5. Most patients will experience some swelling and bruising in the area of the incision. Ice pack will help. Swelling and bruising can take several days to resolve..  6. It is common to experience some constipation if taking pain medication after surgery.  Increasing fluid intake and taking a stool softener will usually help or prevent this problem from occurring.  A mild laxative (Milk of Magnesia or Miralax) should be taken according to package directions if there are no bowel movements after 48 hours. 7.  You may have steri-strips (small skin tapes) in place directly over the incision.  These strips should be left on the skin for 7-10 days.  If your  surgeon used skin glue on the incision, you may shower in 24 hours.  The glue will flake off over the next 2-3 weeks.  Any sutures or staples will be removed at the office during your follow-up visit. You may find that a light gauze bandage over your incision may keep your staples from being rubbed or pulled. You may shower and replace the bandage daily. 8. ACTIVITIES:  You may resume regular (light) daily activities beginning the next day--such as daily self-care, walking, climbing stairs--gradually increasing activities as tolerated.  You may have sexual intercourse when it is comfortable.  Refrain from any heavy lifting or straining until approved by your doctor. a. You may drive when you no longer are taking prescription pain medication, you can comfortably wear a seatbelt, and you can safely maneuver your car and apply brakes  9. You should see your doctor in the office for a follow-up appointment approximately two weeks after your surgery.  Make sure that you call for this appointment within a day or two after you arrive home to insure a convenient appointment time.   WHEN TO CALL YOUR DOCTOR: 1. Fever over 101.0 2. Inability to urinate 3. Nausea and/or vomiting 4. Extreme swelling or bruising 5. Continued bleeding from incision. 6. Increased pain, redness, or drainage from the incision. 7. Difficulty swallowing or breathing 8. Muscle cramping or spasms. 9. Numbness or tingling in hands or feet or around lips.  The clinic staff is available to answer your questions during regular business hours.  Please don't hesitate to call and ask to speak to one of the nurses if you have concerns.  For further questions, please visit [email or web address]   Colostomy Home Guide, Adult  Colostomy surgery is done to create an opening in the front of the abdomen for stool (feces) to leave the body through an ostomy (stoma). Part of the large intestine is attached to the stoma. A bag, also  called a pouch, is fitted over the stoma. Stool and gas will collect in the bag. After surgery, you will need to empty and change your colostomy bag as needed. You will also need to care for your stoma. How to care for the stoma Your stoma should look pink, red, and moist, like the inside of your cheek. Soon after surgery, the stoma may be swollen, but this swelling will go away within 6 weeks. To care for the stoma:  Keep the skin around the stoma clean and dry.  Use a clean, soft washcloth to gently wash the stoma and the skin around it. Clean using a circular motion, and wipe away from the stoma opening, not toward it. ? Use warm water and only use cleansers recommended by your health care provider. ? Rinse the stoma area with plain water. ? Dry the area around the stoma well.  Use stoma powder or ointment on your skin only as told by your health care provider. Do not use any other powders, gels, wipes, or creams on the skin around the stoma.  Check the stoma area every day for signs of infection. Check for: ? New or worsening redness, swelling, or pain. ? New or increased fluid or blood. ? Pus or warmth.  Measure the stoma opening regularly and record the size. Watch for changes. (It is normal for the stoma to get smaller as swelling goes away.) Share this information with your health care provider. How to empty the colostomy bag  Empty your bag at bedtime and whenever it is one-third to one-half full. Do not let the bag get more than half-full with stool or gas. The bag could leak if it gets too full. Some colostomy bags have a built-in gas release valve that releases gas often throughout the day. Follow these basic steps: 1. Wash your hands with soap and water. 2. Sit far back on the toilet seat. 3. Put several pieces of toilet paper into the toilet water. This will prevent splashing as you empty stool into the toilet. 4. Remove the clip or the hook-and-loop fastener from the tail  end of the bag. 5. Unroll the tail, then empty the stool into the toilet. 6. Clean the tail with toilet paper or a moist towelette. 7. Reroll the tail, and close it with the clip or the hook-and-loop fastener. 8. Wash your hands again. How to change the colostomy bag Change your bag every 3-4 days or as often as told by your health care provider. Also change the bag if it is leaking or separating from the skin, or if your skin around the stoma looks or feels irritated. Irritated skin may be a sign that the bag is leaking. Always have colostomy supplies with you, and follow these basic steps: 1. Wash your hands with soap and water. Have paper towels or tissues nearby to clean any discharge. 2. Remove the old bag and skin barrier. Use your fingers or a warm cloth to gently push the skin away from the barrier. 3. Clean the stoma area with water or with mild soap  and water, as directed. Use water to rinse away any soap. 4. Dry the skin. You may use the cool setting on a hair dryer to do this. 5. Use a tracing pattern (template) to cut the skin barrier to the size needed. 6. If you are using a two-piece bag, attach the bag and the skin barrier to each other. Add the barrier ring, if you use one. 7. If directed, apply stoma powder or skin barrier gel to the skin. 8. Warm the skin barrier with your hands, or blow with a hair dryer for 5-10 seconds. 9. Remove the paper from the adhesive strip of the skin barrier. 10. Press the adhesive strip onto the skin around the stoma. 11. Gently rub the skin barrier onto the skin. This creates heat that helps the barrier to stick. 12. Apply stoma tape to the edges of the skin barrier, if desired. 95. Wash your hands again. General recommendations  Avoid wearing tight clothes or having anything press directly on your stoma or bag. Change your clothing whenever it is soiled or damp.  You may shower or bathe with the bag on or off. Do not use harsh or oily soaps  or lotions. Dry the skin and bag after bathing.  Store all supplies in a cool, dry place. Do not leave supplies in extreme heat because some parts can melt or not stick as well.  Whenever you leave home, take extra clothing and an extra skin barrier and bag with you.  If your bag gets wet, you can dry it with a hair dryer on the cool setting.  To prevent odor, you may put drops of ostomy deodorizer in the bag.  If recommended by your health care provider, put ostomy lubricant inside the bag. This helps stool to slide out of the bag more easily and completely. Contact a health care provider if:  You have new or worsening redness, swelling, or pain around your stoma.  You have new or increased fluid or blood coming from your stoma.  Your stoma feels warm to the touch.  You have pus coming from your stoma.  Your stoma extends in or out farther than normal.  You need to change your bag every day.  You have a fever. Get help right away if:  Your stool is bloody.  You have nausea or you vomit.  You have trouble breathing. Summary  Measure your stoma opening regularly and record the size. Watch for changes.  Empty your bag at bedtime and whenever it is one-third to one-half full. Do not let the bag get more than half-full with stool or gas.  Change your bag every 3-4 days or as often as told by your health care provider.  Whenever you leave home, take extra clothing and an extra skin barrier and bag with you. This information is not intended to replace advice given to you by your health care provider. Make sure you discuss any questions you have with your health care provider. Document Revised: 04/02/2019 Document Reviewed: 06/06/2017 Elsevier Patient Education  [place].   Negative Pressure Wound Therapy Home Guide Negative pressure wound therapy (NPWT) uses a sponge or foam-like material (dressing) placed on or inside the wound. The wound is then covered and  sealed with a cover dressing that sticks to your skin (is adhesive). This keeps air out. A tube is attached to the cover dressing, and this tube connects to a small pump. The pump sucks fluid and germs from the wound.  NPWT helps to increase blood flow to the wound and heal it from the inside. What are the risks? NPWT is usually safe to use. However, problems can occur, including:  Skin irritation from the dressing adhesive.  Bleeding.  Infection.  Dehydration. Wounds with large amounts of drainage can cause excessive fluid loss.  Pain. Supplies needed:  A disposable garbage bag.  Soap and water, or hand sanitizer.  Wound cleanser or salt-water solution (saline).  New sponge and cover dressing.  Protective clothing.  Gauze pad.  Vinyl gloves.  Tape.  Skin protectant. This may be a wipe, film, or spray.  Clean or germ-free (sterile) scissors.  Eye protection. How to change your dressing Prepare to change your dressing  1. If told by your health care provider, take pain medicine 30 minutes before changing the dressing. 2. Wash your hands with soap and water. Dry your hands with a clean towel. If soap and water are not available, use hand sanitizer. 3. Set up a clean station for wound care. 4. Open the dressing package so that the sponge dressing remains on the inside of the package. 5. Wear gloves, protective clothing, and eye protection. Remove old dressing  1. Turn off the pump and disconnect the tubing from the dressing. 2. Carefully remove the adhesive cover dressing in the direction of your hair growth. 3. Remove the sponge dressing that is inside the wound. If the sponge sticks, use a wound cleanser or saline solution to wet the sponge and help it come off more easily. 4. Throw the old sponge and cover dressing supplies into the garbage bag. 5. Remove your gloves by grabbing the cuff and turning the glove inside out. Place the gloves in the trash  immediately. 6. Wash your hands with soap and water. Dry your hands with a clean towel. If soap and water are not available, use hand sanitizer. Clean your wound  Wear gloves, protective clothing, and eye protection. Follow your health care provider's instructions on how to clean your wound. You may be told to: 1. Clean the wound using a saline solution or a wound cleanser and a clean gauze pad. 2. Pat the wound dry with a gauze pad. Do not rub the wound. 3. Throw the gauze pad into the garbage bag. 4. Remove your gloves by grabbing the cuff and turning the glove inside out. Place the gloves in the trash immediately. 5. Wash your hands with soap and water. Dry your hands with a clean towel. If soap and water are not available, use hand sanitizer. Apply new dressing  Wear gloves, protective clothing, and eye protection. 1. If told by your health care provider, apply a skin protectant to any skin that will be exposed to adhesive. Let the skin protectant dry. 2. Cut a piece of new sponge dressing and put it on or in the wound. 3. Using clean scissors, cut a nickel-sized hole in the new cover dressing. 4. Apply the cover dressing. 5. Attach the suction tube over the hole in the cover dressing. 6. Take off your gloves. Put them in the plastic bag with the old dressing. Tie the bag shut and throw it away. 7. Wash your hands with soap and water. Dry your hands with a clean towel. If soap and water are not available, use hand sanitizer. 8. Turn the pump back on. The sponge dressing should collapse. Do not change the settings on the machine without talking to a health care provider. 9. Replace the container in the  pump that collects fluid if it is full. Replace the container per the manufacturer's instructions or at least once a week, even if it is not full. General tips and recommendations If the alarm sounds:  Stay calm.  Do not turn off the pump or do anything with the dressing.  Reasons the  alarm may go off: ? The battery is low. Change the battery or plug the device into electrical power. ? The dressing has a leak. Find the leak and put tape over the leak. ? The fluid collection container is full. Change the fluid container.  Call your health care provider right away if you cannot fix the problem.  Explain to your health care provider what is happening. Follow his or her instructions. General instructions  Do not turn off the pump unless told to do so by your health care provider.  Do not turn off the pump for more than 2 hours. If the pump is off for more than 2 hours, the dressing will need to be changed.  If your health care provider says it is okay to shower: ? Do not take the pump into the shower. ? Make sure the wound dressing is protected and sealed. The wound dressing must stay dry.  Check frequently that the machine indicates that therapy is on and that all clamps are open.  Do not use over-the-counter medicated or antiseptic creams, sprays, liquids, or dressings unless your health care provider approves. Contact a health care provider if:  You have new pain.  You develop irritation, a rash, or itching around the wound or dressing.  You see new black or yellow tissue in your wound.  The dressing changes are painful or cause bleeding.  The pump has been off for more than 2 hours, and you do not know how to change the dressing.  The pump alarm goes off, and you do not know what to do. Get help right away if:  You have a lot of bleeding.  The wound breaks open.  You have severe pain.  You have signs of infection, such as: ? More redness, swelling, or pain. ? More fluid or blood. ? Warmth. ? Pus or a bad smell. ? Red streaks leading from the wound. ? A fever.  You see a sudden change in the color or texture of the drainage.  You have signs of dehydration, such as: ? Little or no tears, urine, or sweat. ? Muscle cramps. ? Very dry  mouth. ? Headache. ? Dizziness. Summary  Negative pressure wound therapy (NPWT) is a device that helps your wound heal.  Set up a clean station for wound care. Your health care provider will tell you what supplies to use.  Follow your health care provider's instructions on how to clean your wound and how to change the dressing.  Contact a health care provider if you have new pain, an irritation, or a rash, or if the alarm goes off and you do not know what to do.  Get help right away if you have a lot of bleeding, your wound breaks open, or you have severe pain. Also, get help if you have signs of infection. This information is not intended to replace advice given to you by your health care provider. Make sure you discuss any questions you have with your health care provider. Document Revised: 04/04/2019 Document Reviewed: 02/28/2019 Elsevier Patient Education  [place].

## 2020-11-22 NOTE — Discharge Summary (Signed)
Physician Discharge Summary  Chloe Young DGU:[id number] DOB: [birth year] DOA: 11/08/2020  PCP: Binnie Rail, MD  Admit date: 11/08/2020 Discharge date: 11/22/2020  Admitted From: Home Disposition:  SNF  Recommendations for Outpatient Follow-up:  1. Follow up with Surgeryin 1-2 weeks 2. Please obtain BMP/CBC in one week   Home Health:No Equipment/Devices:None  Discharge Condition:Stable CODE STATUS:Full Diet recommendation: Heart Healthy   Brief/Interim Summary: 69 y.o. 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  The patient developed postoperative ileus required NG tube and TPN, ileus resolved NG tube removed on 11/13/2020 was allowed a soft diet.  She subsequently started developing a mild fever with purulent drainage on the wound VAC, CT scan of the abdomen and pelvis was done that showed multiple abscesses she was restarted on empiric antibiotics  Discharge Diagnoses:  Principal Problem:   Peritonitis ([place]) Active Problems:   Hyperlipidemia   Essential hypertension   GERD (gastroesophageal reflux disease)   Severe sepsis (H[facility])   Perforated abdominal viscus  Septic shock due to peritonitis in the setting of bowel perforation: She  status post laparoscopy with colostomy and colectomy treated with IV antibiotics and pressors, she completed her antibiotic in house and was weaned off antibiotic. She was started on TPN she came out of surgery with wound VAC she started developing fevers and purulent material through the wound VAC CT abdomen and pelvis showed multiple small abscesses not amenable for drainage.  She was started on oral Augmentin her leukocytosis improved she defervesced she will continue Augmentin for 14 days post discharge.  Acute right humeral fracture: Continue pain medication.  Alcohol use: No signs of withdrawal.  Essential hypertension/hyperlipidemia: No changes made to her medication.  Discharge Instructions  Discharge Instructions    Diet - low sodium heart healthy   Complete by: As directed    Discharge wound care:   Complete by: As directed    Saline WTD dressing to midline wound BID   Increase activity slowly   Complete by: As directed      Allergies as of 11/22/2020   No Known Allergies     Medication List    STOP taking these medications   HYDROcodone-acetaminophen 7.5-325 MG tablet Commonly known as: NORCO   meloxicam 15 MG tablet Commonly known as: MOBIC     TAKE these medications   acetaminophen 500 MG tablet Commonly known as: TYLENOL Take 1 tablet (500 mg total) by mouth every 6 (six) hours as needed for moderate pain.   alendronate 70 MG tablet Commonly known as: FOSAMAX TAKE 1 TABLET BY MOUTH  EVERY 7 DAYS TAKE WITH A  FULL GLASS OF WATER ON AN  EMPTY STOMACH What changed:   how much to take  how to take this  when to take this  additional instructions   amoxicillin-clavulanate 875-125  MG tablet Commonly known as: AUGMENTIN Take 1 tablet by mouth every 12 (twelve) hours for 12 days.   CALCIUM + D PO Take 1 tablet by mouth daily.   feeding supplement Liqd Take 237 mLs by mouth 3 (three) times daily between meals.   fluticasone 50 MCG/ACT nasal  spray Commonly known as: FLONASE USE 1 SPRAY IN BOTH  NOSTRILS TWICE DAILY What changed: See the new instructions.   hydrochlorothiazide 12.5 MG capsule Commonly known as: MICROZIDE Take 1 capsule (12.5 mg total) by mouth daily. Annual appt due in March must see provider for future refills   Lyrica 150 MG capsule Generic drug: pregabalin Take 150 mg by mouth 2 (two) times daily.   metoprolol tartrate 50 MG tablet Commonly known as: LOPRESSOR Take 0.5 tablets (25 mg total) by mouth 2 (two) times daily. Annual appt due in March must see provider for future refills   multivitamin with minerals Tabs tablet Take 1 tablet by mouth daily.   omeprazole 20 MG capsule Commonly known as: PRILOSEC TAKE 1 CAPSULE BY MOUTH  DAILY Annual appt due in March must see provider for future refills What changed:   how much to take  how to take this  when to take this  additional instructions   oxyCODONE 5 MG immediate release tablet Commonly known as: Oxy IR/ROXICODONE Take 1 tablet (5 mg total) by mouth every 6 (six) hours as needed for severe pain.   rosuvastatin 5 MG tablet Commonly known as: CRESTOR Take 1 tablet (5 mg total) by mouth daily. Annual appt due in March must see provider for future refills   topiramate 100 MG tablet Commonly known as: TOPAMAX Take 2 tablets by mouth 2 (two) times daily.   triamcinolone 55 MCG/ACT Aero nasal inhaler Commonly known as: NASACORT Place 2 sprays into the nose daily.   triamcinolone cream 0.5 % Commonly known as: KENALOG APPLY 1 APPLICATION  TOPICALLY 2 (TWO) TIMES  DAILY AS NEEDED. What changed: reasons to take this            Durable Medical Equipment  (From admission, onward)         Start     Ordered   11/12/20 1603  For home use only DME Negative pressure wound device  Once       Question Answer Comment  Frequency of dressing change 3 times per week   Length of need 3 Months   Dressing type Foam   Amount of suction 125  mm/Hg   Pressure application Continuous pressure   Supplies 10 canisters and 15 dressings per month for duration of therapy      11/12/20 1602           Discharge Care Instructions  (From admission, onward)         Start     Ordered   11/22/20 0000  Discharge wound care:       Comments: Saline WTD dressing to midline wound BID   11/22/20 7858          Follow-up Information    Stark Klein, MD. Go on 12/20/2020.   Specialty: General Surgery Why: Follow up appointment scheduled for 4:15 PM. Please arrive 30 min prior to appointment time.  Contact information: [address] [place] [address] [phone]        [facility] Follow up in 1 week(s).   Specialty: Specialist Contact information: [address] [place] [address] [phone]  No Known Allergies  Consultations:  General surgery  Critical care   Procedures/Studies: DG Abdomen 1 View  Result Date: 11/08/2020 CLINICAL DATA:  NG tube placement EXAM: ABDOMEN - 1 VIEW COMPARISON:  CT earlier today. FINDINGS: NG tube is within the stomach. Free air noted under the right hemidiaphragm as seen on prior CT. IMPRESSION: NG tube in the stomach. Electronically Signed   By: Rolm Baptise M.D.   On: 11/08/2020 17:44   CT ABDOMEN PELVIS W CONTRAST  Result Date: 11/17/2020 CLINICAL DATA:  Abdominal abscess. EXAM: CT ABDOMEN AND PELVIS WITH CONTRAST TECHNIQUE: Multidetector CT imaging of the abdomen and pelvis was performed using the standard protocol following bolus administration of intravenous contrast. CONTRAST:  175mL OMNIPAQUE IOHEXOL 300 MG/ML  SOLN COMPARISON:  CT dated November 08, 2020 FINDINGS: Lower chest: Bibasilar atelectasis is noted, right worse than left.The heart size is normal. Hepatobiliary: The liver is normal. Normal gallbladder.There is no biliary ductal dilation. Pancreas: Normal contours without ductal dilatation. No peripancreatic fluid  collection. Spleen: Unremarkable. Adrenals/Urinary Tract: --Adrenal glands: Unremarkable. --Right kidney/ureter: No hydronephrosis or radiopaque kidney stones. --Left kidney/ureter: There is a punctate nonobstructing stone in the interpolar region of the left kidney. --Urinary bladder: There is gas within the urinary bladder which is presumably from recent instrumentation. Stomach/Bowel: --Stomach/Duodenum: No hiatal hernia or other gastric abnormality. Normal duodenal course and caliber. --Small bowel: There are mildly hyperenhancing loops of small bowel in the low midline pelvis, likely reactive. 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 The colostomy is unremarkable. There is no evidence for large bowel obstruction. Oral contrast is noted within the colon. --Appendix: Normal. Vascular/Lymphatic: Atherosclerotic calcification is present within the non-aneurysmal abdominal aorta, without hemodynamically significant stenosis. --No retroperitoneal lymphadenopathy. --No mesenteric lymphadenopathy. --there are few prominent lymph nodes along the left pelvic sidewall, likely reactive. Reproductive: Status post hysterectomy. No adnexal mass. Other: Small developing abscesses are noted in the patient's pelvis measuring up to approximately 2.8 cm. There are few pockets of extraluminal gas in the patient's pelvis likely postsurgical in etiology. There are phlegm a tori changes in the patient's low abdomen and pelvis, likely postsurgical. There is an open midline incision. 2 Musculoskeletal. No acute displaced fractures. IMPRESSION: 1. Postsurgical changes of the abdomen as detailed above. 2. Small developing abscesses are noted in the patient's pelvis measuring up to approximately 2.8 cm. 3. There is gas within the urinary bladder which is presumably from recent instrumentation. 4. Bibasilar atelectasis, right worse than left. 5. Punctate  nonobstructing stone in the interpolar region of the left kidney. Aortic Atherosclerosis (ICD10-I70.0). Electronically Signed   By: Constance Holster M.D.   On: 11/17/2020 20:00   CT ABDOMEN PELVIS W CONTRAST  Result Date: 11/08/2020 CLINICAL DATA:  68 year old female with history of abdominal pain. Fever. EXAM: CT ABDOMEN AND PELVIS WITH CONTRAST TECHNIQUE: Multidetector CT imaging of the abdomen and pelvis was performed using the standard protocol following bolus administration of intravenous contrast. CONTRAST:  178mL OMNIPAQUE IOHEXOL 300 MG/ML  SOLN COMPARISON:  No priors. FINDINGS: Lower chest: Subsegmental atelectasis noted in the lower lobes of the lungs bilaterally. Hepatobiliary: No suspicious cystic or solid hepatic lesions. No intra or extrahepatic biliary ductal dilatation. Gallbladder is unremarkable in appearance. Pancreas: No pancreatic mass. No pancreatic ductal dilatation. No pancreatic or peripancreatic fluid collections or inflammatory changes. Spleen: Unremarkable. Adrenals/Urinary Tract: Bilateral kidneys and adrenal glands are normal in appearance. No hydroureteronephrosis. Urinary bladder is normal  in appearance. Stomach/Bowel: Stomach is unremarkable in appearance. There are numerous dilated loops of proximal to mid small bowel, with gradual transition to nondilated loops of mid to distal small bowel. The exact transition point is difficult to localize, however, in the low anatomic pelvis there are several loops of thickened small bowel which demonstrate associated mucosal hyperenhancement, with some surrounding fluid and small amount of extraluminal gas, concerning for regional enteritis affecting predominantly the mid to distal jejunum and/or proximal ileum. Terminal ileum is decompressed, but otherwise grossly unremarkable in appearance. Appendix is normal. Colon is relatively decompressed. There are numerous colonic diverticuli, particularly in the sigmoid colon. There are some  mild inflammatory changes in the sigmoid colon, without definitive focal inflammatory changes to clearly indicate an acute diverticulitis at this time. Vascular/Lymphatic: Aortic atherosclerosis without evidence of aneurysm or dissection in the abdominal or pelvic vasculature. No lymphadenopathy noted in the abdomen or pelvis. Reproductive: Uterus and ovaries are unremarkable in appearance. Other: Small volume of ascites most evident in the central small bowel mesentery adjacent to the inflamed loops of small bowel. Small amount of pneumoperitoneum, also most evident in the small bowel mesentery adjacent to the inflamed loops of small bowel, and in the upper abdomen adjacent to the liver. Musculoskeletal: There are no aggressive appearing lytic or blastic lesions noted in the visualized portions of the skeleton. IMPRESSION: 1. Findings are compatible with an acute bowel perforation. The exact site of perforation is uncertain, however, the favored candidate is the mid small bowel (likely distal jejunum or proximal ileum) where there are multiple loops of thickened and inflamed small bowel with adjacent fluid and gas in the small bowel mesentery. Associated with this there is a partial small bowel obstruction, as above. 2. There is also colonic diverticulosis. There are some subtle inflammatory changes in the sigmoid mesocolon. The possibility of an acute diverticulitis is not excluded, but not strongly favored on the basis of today's examination; however, direct inspection at time of potential laparoscopy is suggested as this could alternatively represent this site of perforation. 3. Aortic atherosclerosis. Critical Value/emergent results were called by telephone at the time of interpretation on 11/08/2020 at 3:09 pm to provider [facility], who verbally acknowledged these results. Electronically Signed   By: Vinnie Langton M.D.   On: 11/08/2020 15:10   DG Chest Portable 1 View  Result Date: 11/08/2020 CLINICAL  DATA:  Right-sided chest pain EXAM: PORTABLE CHEST 1 VIEW COMPARISON:  07/24/2011 FINDINGS: The heart size and mediastinal contours are within normal limits. Lung volumes with mild bibasilar atelectasis. No pleural effusion or pneumothorax. There is an acute fracture of the proximal right humerus with transverse component involving the surgical neck as well as a nondisplaced component involving the greater tuberosity. Fracture is mildly displaced and angulated. Glenohumeral joint alignment appears maintained without dislocation. IMPRESSION: 1. Acute fracture of the proximal right humerus. 2. Low lung volumes with mild bibasilar atelectasis. Electronically Signed   By: Davina Poke D.O.   On: 11/08/2020 13:03   DG Shoulder Right Port  Result Date: 11/10/2020 CLINICAL DATA:  Fracture EXAM: PORTABLE RIGHT SHOULDER COMPARISON:  November 08, 2020 chest radiograph including right shoulder on frontal view FINDINGS: Frontal and oblique views were obtained. There is a comminuted fracture of the proximal humeral metaphysis with medial displacement and lateral angulation of the distal major fragment with respect to the major proximal fragment. There are fractures of the greater tuberosity with mild avulsion in this area. No gross dislocation. No appreciable joint space  narrowing or erosion. IMPRESSION: Comminuted fracture proximal right humerus with involvement of the proximal humeral metaphysis and greater tuberosity. At the proximal metaphysis level, there is medial displacement and lateral angulation distally. Mild avulsion of the greater tuberosity. No gross dislocation. No appreciable joint space narrowing. Electronically Signed   By: Lowella Grip III M.D.   On: 11/10/2020 08:41   [place] EKG SITE RITE  Result Date: 11/10/2020 If Site Rite image not attached, placement could not be confirmed due to current cardiac rhythm.      Subjective: No new complaints.  Discharge Exam: Vitals:   11/22/20 0400  11/22/20 0551  BP: 113/68 113/68  Pulse: 93   Resp:    Temp: 98.9 F (37.2 C)   SpO2:     Vitals:   11/21/20 2006 11/21/20 2347 11/22/20 0400 11/22/20 0551  BP: 97/63 103/64 113/68 113/68  Pulse: 98 94 93   Resp:      Temp: 98.2 F (36.8 C) 98.3 F (36.8 C) 98.9 F (37.2 C)   TempSrc:   Oral   SpO2:      Weight:      Height:        General: Pt is alert, awake, not in acute distress Cardiovascular: RRR, S1/S2 +, no rubs, no gallops Respiratory: CTA bilaterally, no wheezing, no rhonchi Abdominal: Soft, NT, ND, bowel sounds + Extremities: no edema, no cyanosis    The results of significant diagnostics from this hospitalization (including imaging, microbiology, ancillary and laboratory) are listed below for reference.     Microbiology: Recent Results (from the past 240 hour(s))  SARS Coronavirus 2 by RT PCR (hospital order, performed in [facility] hospital lab) Nasopharyngeal Nasopharyngeal Swab     Status: None   Collection Time: 11/16/20  4:35 PM   Specimen: Nasopharyngeal Swab  Result Value Ref Range Status   SARS Coronavirus 2 NEGATIVE NEGATIVE Final    Comment: (NOTE) SARS-CoV-2 target nucleic acids are NOT DETECTED.  The SARS-CoV-2 RNA is generally detectable in upper and lower respiratory specimens during the acute phase of infection. The lowest concentration of SARS-CoV-2 viral copies this assay can detect is 250 copies / mL. A negative result does not preclude SARS-CoV-2 infection and should not be used as the sole basis for treatment or other patient management decisions.  A negative result may occur with improper specimen collection / handling, submission of specimen other than nasopharyngeal swab, presence of viral mutation(s) within the areas targeted by this assay, and inadequate number of viral copies (<250 copies / mL). A negative result must be combined with clinical observations, patient history, and epidemiological information.  Fact Sheet for  Patients:   StrictlyIdeas.no  Fact Sheet for Healthcare Providers: BankingDealers.co.za  This test is not yet approved or  cleared by the [place] FDA and has been authorized for detection and/or diagnosis of SARS-CoV-2 by FDA under an Emergency Use Authorization (EUA).  This EUA will remain in effect (meaning this test can be used) for the duration of the COVID-19 declaration under Section 564(b)(1) of the Act, 21 U.S.C. section 360bbb-3(b)(1), unless the authorization is terminated or revoked sooner.  Performed at [facility], [place] [address], [place], [place] [address]   Culture, blood (Routine X 2) w Reflex to ID Panel     Status: None (Preliminary result)   Collection Time: 11/18/20  7:30 AM   Specimen: BLOOD RIGHT HAND  Result Value Ref Range Status   Specimen Description BLOOD RIGHT HAND  Final  Special Requests   Final    BOTTLES DRAWN AEROBIC ONLY Blood Culture adequate volume   Culture   Final    NO GROWTH 3 DAYS Performed at [facility], [place] [address], [place], [place] [address]    Report Status PENDING  Incomplete  Culture, blood (Routine X 2) w Reflex to ID Panel     Status: None (Preliminary result)   Collection Time: 11/18/20  7:31 AM   Specimen: BLOOD LEFT HAND  Result Value Ref Range Status   Specimen Description BLOOD LEFT HAND  Final   Special Requests   Final    BOTTLES DRAWN AEROBIC ONLY Blood Culture adequate volume   Culture   Final    NO GROWTH 3 DAYS Performed at [facility], [place] [address] [place], [place] [address]    Report Status PENDING  Incomplete     Labs: BNP (last 3 results) No results for input(s): BNP in the last 8760 hours. Basic Metabolic Panel: Recent Labs  Lab 11/17/20 0744 11/19/20 0145 11/20/20 0223  NA 134* 133* 133*  K 3.9 4.1 4.0  CL 97* 97* 96*  CO2 24 27 27   GLUCOSE 121* 116* 106*  BUN 8 6* 9  CREATININE 0.53 0.52 0.50  CALCIUM 8.8* 8.9  9.2  MG  --  2.3  --    Liver Function Tests: No results for input(s): AST, ALT, ALKPHOS, BILITOT, PROT, ALBUMIN in the last 168 hours. No results for input(s): LIPASE, AMYLASE in the last 168 hours. No results for input(s): AMMONIA in the last 168 hours. CBC: Recent Labs  Lab 11/15/20 1543 11/17/20 0744 11/19/20 0145 11/20/20 0223  WBC 10.3 12.3* 11.6* 8.9  HGB 9.6* 8.5* 8.3* 8.6*  HCT 29.3* 26.2* 25.6* 26.8*  MCV 93.6 94.2 95.9 95.0  PLT 453* 514* 601* 620*   Cardiac Enzymes: No results for input(s): CKTOTAL, CKMB, CKMBINDEX, TROPONINI in the last 168 hours. BNP: Invalid input(s): POCBNP CBG: Recent Labs  Lab 11/15/20 1604 11/16/20 0835 11/16/20 1151 11/16/20 1621 11/21/20 0745  GLUCAP 97 95 103* 117* 87   D-Dimer No results for input(s): DDIMER in the last 72 hours. Hgb A1c No results for input(s): HGBA1C in the last 72 hours. Lipid Profile No results for input(s): CHOL, HDL, LDLCALC, TRIG, CHOLHDL, LDLDIRECT in the last 72 hours. Thyroid function studies No results for input(s): TSH, T4TOTAL, T3FREE, THYROIDAB in the last 72 hours.  Invalid input(s): FREET3 Anemia work up No results for input(s): VITAMINB12, FOLATE, FERRITIN, TIBC, IRON, RETICCTPCT in the last 72 hours. Urinalysis    Component Value Date/Time   COLORURINE YELLOW 11/08/2020 2257   APPEARANCEUR HAZY (A) 11/08/2020 2257   LABSPEC 1.043 (H) 11/08/2020 2257   PHURINE 5.0 11/08/2020 2257   GLUCOSEU NEGATIVE 11/08/2020 2257   HGBUR SMALL (A) 11/08/2020 2257   BILIRUBINUR NEGATIVE 11/08/2020 2257   KETONESUR NEGATIVE 11/08/2020 2257   PROTEINUR 100 (A) 11/08/2020 2257   UROBILINOGEN 0.2 10/19/2014 1442   NITRITE NEGATIVE 11/08/2020 2257   LEUKOCYTESUR NEGATIVE 11/08/2020 2257   Sepsis Labs Invalid input(s): PROCALCITONIN,  WBC,  LACTICIDVEN Microbiology Recent Results (from the past 240 hour(s))  SARS Coronavirus 2 by RT PCR (hospital order, performed in [facility] hospital lab)  Nasopharyngeal Nasopharyngeal Swab     Status: None   Collection Time: 11/16/20  4:35 PM   Specimen: Nasopharyngeal Swab  Result Value Ref Range Status   SARS Coronavirus 2 NEGATIVE NEGATIVE Final    Comment: (NOTE) SARS-CoV-2 target  nucleic acids are NOT DETECTED.  The SARS-CoV-2 RNA is generally detectable in upper and lower respiratory specimens during the acute phase of infection. The lowest concentration of SARS-CoV-2 viral copies this assay can detect is 250 copies / mL. A negative result does not preclude SARS-CoV-2 infection and should not be used as the sole basis for treatment or other patient management decisions.  A negative result may occur with improper specimen collection / handling, submission of specimen other than nasopharyngeal swab, presence of viral mutation(s) within the areas targeted by this assay, and inadequate number of viral copies (<250 copies / mL). A negative result must be combined with clinical observations, patient history, and epidemiological information.  Fact Sheet for Patients:   StrictlyIdeas.no  Fact Sheet for Healthcare Providers: BankingDealers.co.za  This test is not yet approved or  cleared by the [place] FDA and has been authorized for detection and/or diagnosis of SARS-CoV-2 by FDA under an Emergency Use Authorization (EUA).  This EUA will remain in effect (meaning this test can be used) for the duration of the COVID-19 declaration under Section 564(b)(1) of the Act, 21 U.S.C. section 360bbb-3(b)(1), unless the authorization is terminated or revoked sooner.  Performed at [facility], [place] [address], [place], [place] [address]   Culture, blood (Routine X 2) w Reflex to ID Panel     Status: None (Preliminary result)   Collection Time: 11/18/20  7:30 AM   Specimen: BLOOD RIGHT HAND  Result Value Ref Range Status   Specimen Description BLOOD RIGHT HAND  Final   Special  Requests   Final    BOTTLES DRAWN AEROBIC ONLY Blood Culture adequate volume   Culture   Final    NO GROWTH 3 DAYS Performed at [facility], [place] [address], [place], [place] [address]    Report Status PENDING  Incomplete  Culture, blood (Routine X 2) w Reflex to ID Panel     Status: None (Preliminary result)   Collection Time: 11/18/20  7:31 AM   Specimen: BLOOD LEFT HAND  Result Value Ref Range Status   Specimen Description BLOOD LEFT HAND  Final   Special Requests   Final    BOTTLES DRAWN AEROBIC ONLY Blood Culture adequate volume   Culture   Final    NO GROWTH 3 DAYS Performed at [facility], [place] [address] [place], [place] [address]    Report Status PENDING  Incomplete     Time coordinating discharge: Over 30 minutes  SIGNED:   Charlynne Cousins, MD  [facility] 11/22/2020, 8:08 AM Pager   If 7PM-7AM, please contact night-coverage [email or web address] Password [email or web address]

## 2020-11-22 NOTE — Progress Notes (Signed)
RN attempted to call report to [facility] but was unsuccessful. Loop recording and on hold for 6 minutes. Called 3 times. No answer on their recorded operator/switchboard. Will notify nightshift to retry at number [phone].

## 2020-11-22 NOTE — Progress Notes (Signed)
Patient ID: Chloe Young, female   DOB: 1951/08/15, 69 y.o.   MRN: [id number]    14 Days Post-Op  Subjective: No new issues.  Waiting for DC to SNF today.  Eating well.  Abdominal pain is well controlled.  Ostomy working well.  ROS: See above, otherwise other systems negative  Objective: Vital signs in last 24 hours: Temp:  [97.8 F (36.6 C)-98.9 F (37.2 C)] 98.4 F (36.9 C) (11/29 0818) Pulse Rate:  [93-98] 93 (11/29 0818) Resp:  [20] 20 (11/29 0818) BP: (97-115)/(63-71) 98/71 (11/29 0818) SpO2:  [94 %] 94 % (11/28 1758) Last BM Date: 11/22/20  Intake/Output from previous day: 11/28 0701 - 11/29 0700 In: 1320 [P.O.:1320] Out: 200 [Stool:200] Intake/Output this shift: Total I/O In: 240 [P.O.:240] Out: -   PE: Abd: soft, wound is mostly clean with some fibrin at the very base, appropriately tender, LLQ ostomy with some output and air present.  Stoma is pink and viable.  Lab Results:  Recent Labs    11/20/20 0223  WBC 8.9  HGB 8.6*  HCT 26.8*  PLT 620*   BMET Recent Labs    11/20/20 0223  NA 133*  K 4.0  CL 96*  CO2 27  GLUCOSE 106*  BUN 9  CREATININE 0.50  CALCIUM 9.2   PT/INR No results for input(s): LABPROT, INR in the last 72 hours. CMP     Component Value Date/Time   NA 133 (L) 11/20/2020 0223   K 4.0 11/20/2020 0223   CL 96 (L) 11/20/2020 0223   CO2 27 11/20/2020 0223   GLUCOSE 106 (H) 11/20/2020 0223   BUN 9 11/20/2020 0223   CREATININE 0.50 11/20/2020 0223   CALCIUM 9.2 11/20/2020 0223   PROT 5.0 (L) 11/13/2020 0410   ALBUMIN 2.0 (L) 11/13/2020 0410   AST 40 11/13/2020 0410   ALT 35 11/13/2020 0410   ALKPHOS 68 11/13/2020 0410   BILITOT 0.6 11/13/2020 0410   GFRNONAA >60 11/20/2020 0223   GFRAA >90 01/15/2012 0344   Lipase     Component Value Date/Time   LIPASE 25 11/08/2020 1236       Studies/Results: No results found.  Anti-infectives: Anti-infectives (From admission, onward)   Start     Dose/Rate Route Frequency  Ordered Stop   11/20/20 0000  amoxicillin-clavulanate (AUGMENTIN) 875-125 MG tablet        1 tablet Oral Every 12 hours 11/20/20 0902 12/02/20 2359   11/18/20 1500  piperacillin-tazobactam (ZOSYN) IVPB 3.375 g  Status:  Discontinued        3.375 g 12.5 mL/hr over 240 Minutes Intravenous Every 8 hours 11/18/20 0757 11/18/20 0938   11/18/20 1030  amoxicillin-clavulanate (AUGMENTIN) 875-125 MG per tablet 1 tablet        1 tablet Oral Every 12 hours 11/18/20 0938     11/18/20 1000  amoxicillin-clavulanate (AUGMENTIN) 875-125 MG per tablet 1 tablet  Status:  Discontinued        1 tablet Oral Every 12 hours 11/18/20 0656 11/18/20 0757   11/18/20 0845  piperacillin-tazobactam (ZOSYN) IVPB 3.375 g        3.375 g 100 mL/hr over 30 Minutes Intravenous  Once 11/18/20 0757 11/19/20 1648   11/09/20 2330  piperacillin-tazobactam (ZOSYN) IVPB 3.375 g       "Followed by" Linked Group Details   3.375 g 12.5 mL/hr over 240 Minutes Intravenous Every 8 hours 11/08/20 1704 11/14/20 2019   11/08/20 2200  ceFEPIme (MAXIPIME) 2 g in sodium chloride 0.9 %  100 mL IVPB  Status:  Discontinued        2 g 200 mL/hr over 30 Minutes Intravenous Every 12 hours 11/08/20 1335 11/08/20 1638   11/08/20 1830  piperacillin-tazobactam (ZOSYN) IVPB 3.375 g        3.375 g 12.5 mL/hr over 240 Minutes Intravenous To Surgery 11/08/20 1823 11/09/20 1830   11/08/20 1715  piperacillin-tazobactam (ZOSYN) IVPB 3.375 g       "Followed by" Linked Group Details   3.375 g 100 mL/hr over 30 Minutes Intravenous  Once 11/08/20 1704 11/13/20 1030   11/08/20 1245  ceFEPIme (MAXIPIME) 2 g in sodium chloride 0.9 % 100 mL IVPB        2 g 200 mL/hr over 30 Minutes Intravenous  Once 11/08/20 1243 11/08/20 1338       Assessment/Plan HTN HLD Chronic pain Recent hx of R humerus fracture -Dr. 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 Barry Dienes - POD#[id number] -Continue damp to dry  dressing changes to midline wound - Antibioticsstopped 11/21,repeat CT 11/24 with small pelvic abscesses. Too small to drain. WBC has normalized to 8.9, no fever, generally feeling well. Continue abx (augmentin) for total 14-day course.  Okay for discharge to SNF from surgery standpoint. -follow up has been arranged for the patient  FEN:reg VTE: lovenox ID: cefepime 11/15; Zosyn 11/15>11/21, augmentin 11/25-> 14 days total     LOS: 14 days    Henreitta Cea , [facility] Surgery 11/22/2020, 10:45 AM Please see Amion for pager number during day hours 7:00am-4:30pm or 7:00am -11:30am on weekends

## 2020-11-22 NOTE — Consult Note (Signed)
[place] Nurse ostomy follow up Patient receiving care in [facility] 4N08 Stoma type/location: LLQ end colostomy Stomal assessment/size: 1 5/8" budded, pink, moist Peristomal assessment: Intact Output: Liquid Polich Ostomy pouching: 2pc. 2 3/4" Kellie Simmering # 2) with 2 3/4" skin barrier Kellie Simmering # 2) with barrier ring Kellie Simmering # [phone]) Changed today. Education provided: Patient was awake and alert enough for me to walk her through the entire process of removing and replacing pouch. The patient currently has limited use of the right arm and is unable to assist with pouch change. Pouch should be changed at least twice a week. She is to be discharged today to a SNF, therefore the [facility] nurse will sign off at this time.  Enrolled patient in [place] Start Discharge program: Yes previously  Jocelyn Lamer L. Tamala Julian, MSN, RN, [place], Lysle Pearl, [facility] Wound Treatment Associate Pager [phone]

## 2020-11-22 NOTE — TOC Transition Note (Signed)
Transition of Care [facility]) - CM/SW Discharge Note   Patient Details  Name: Chloe Young MRN: [id number] Date of Birth: December 04, 1951  [facility] of [facility] CM/SW Contact:  Coralee Pesa, [place] Phone Number: 11/22/2020, 2:18 PM   Clinical Narrative:    Nurse to call report to [phone] Rm# [phone]   Final next level of care: Skilled Nursing Facility Barriers to Discharge: Other (comment) (Waiting for Covid results)   Patient Goals and CMS Choice        Discharge Placement              Patient chooses bed at: [facility] Patient to be transferred to facility by: [place] Name of family member notified: Randall Hiss Patient and family notified of of transfer: 11/22/20  Discharge Plan and Services In-house Referral: Clinical Social Work                                   Social Determinants of Health ([place]) Interventions     Readmission Risk Interventions No flowsheet data found.

## 2020-11-23 DIAGNOSIS — Z743 Need for continuous supervision: Secondary | ICD-10-CM | POA: Diagnosis not present

## 2020-11-23 DIAGNOSIS — S42231D 3-part fracture of surgical neck of right humerus, subsequent encounter for fracture with routine healing: Secondary | ICD-10-CM | POA: Diagnosis not present

## 2020-11-23 DIAGNOSIS — I10 Essential (primary) hypertension: Secondary | ICD-10-CM | POA: Diagnosis not present

## 2020-11-23 DIAGNOSIS — E785 Hyperlipidemia, unspecified: Secondary | ICD-10-CM | POA: Diagnosis not present

## 2020-11-23 DIAGNOSIS — Z7401 Bed confinement status: Secondary | ICD-10-CM | POA: Diagnosis not present

## 2020-11-23 DIAGNOSIS — R5381 Other malaise: Secondary | ICD-10-CM | POA: Diagnosis not present

## 2020-11-23 DIAGNOSIS — M6281 Muscle weakness (generalized): Secondary | ICD-10-CM | POA: Diagnosis not present

## 2020-11-23 DIAGNOSIS — M2550 Pain in unspecified joint: Secondary | ICD-10-CM | POA: Diagnosis not present

## 2020-11-23 DIAGNOSIS — R198 Other specified symptoms and signs involving the digestive system and abdomen: Secondary | ICD-10-CM | POA: Diagnosis not present

## 2020-11-23 DIAGNOSIS — K5900 Constipation, unspecified: Secondary | ICD-10-CM | POA: Diagnosis not present

## 2020-11-23 DIAGNOSIS — K56600 Partial intestinal obstruction, unspecified as to cause: Secondary | ICD-10-CM | POA: Diagnosis not present

## 2020-11-23 DIAGNOSIS — A419 Sepsis, unspecified organism: Secondary | ICD-10-CM | POA: Diagnosis not present

## 2020-11-23 DIAGNOSIS — G8911 Acute pain due to trauma: Secondary | ICD-10-CM | POA: Diagnosis not present

## 2020-11-23 DIAGNOSIS — K659 Peritonitis, unspecified: Secondary | ICD-10-CM | POA: Diagnosis not present

## 2020-11-23 DIAGNOSIS — K219 Gastro-esophageal reflux disease without esophagitis: Secondary | ICD-10-CM | POA: Diagnosis not present

## 2020-11-23 LAB — CULTURE, BLOOD (ROUTINE X 2)
Culture: NO GROWTH
Culture: NO GROWTH
Special Requests: ADEQUATE
Special Requests: ADEQUATE

## 2020-11-23 NOTE — Progress Notes (Signed)
Patient discharging with PTAR to facility. This RN tried to call report as well at 21:22 on 11/22/20 and received an automated recording.  PTAR called facility before transport because report had not been given.  Chloe Young answered the phone and told me they were not expecting anyone and also told me she was not an employee of the facility during the conversation.  RN asked to speak to an employee and finally reached Chloe Young who stated she was a Marine scientist at the facility.  Chloe Young said she was not aware of a transfer coming and that she would have to contact admissions to clarify and needed to speak with management first.  She did not take report at this time on the patient.  RN called A/C, Chloe Young to see what the next step in the process was.  Chloe Young told me to discharge the patient and send her with PTAR because the facility had already accepted the patient.  I called back to the facilty and spoke with a Chloe Young in admissions who told me they had received report and I explained according to our notes, the day RN or night RN had not given report on this patient.  I was transferred back to [place] who answered the phone again and let her know that the patient was in route with EMS, per A/C instruction and this was my second attempt at giving report to a person.  Chloe Young spoke with Chloe Young who reluctantly told her to take report.  Chloe Young tried to receive report but I informed her this was a hippa violation since she did not work for [employer] and asked to speak to a nurse that worked their.  I spoke again with Chloe Young who did receive report on the patient and she explained that she was waiting for her management to call her back.  I also explained I spoke with the hospital administrator who told me to discharge the patient and send her with PTAR because the facility had already accepted her.  Report was given on 11/23/20 at 00:52.

## 2020-11-24 DIAGNOSIS — Z933 Colostomy status: Secondary | ICD-10-CM | POA: Diagnosis not present

## 2020-11-24 DIAGNOSIS — G8911 Acute pain due to trauma: Secondary | ICD-10-CM | POA: Diagnosis not present

## 2020-11-24 DIAGNOSIS — A419 Sepsis, unspecified organism: Secondary | ICD-10-CM | POA: Diagnosis not present

## 2020-11-24 DIAGNOSIS — I10 Essential (primary) hypertension: Secondary | ICD-10-CM | POA: Diagnosis not present

## 2020-11-24 DIAGNOSIS — E785 Hyperlipidemia, unspecified: Secondary | ICD-10-CM | POA: Diagnosis not present

## 2020-11-26 DIAGNOSIS — M1650 Unilateral post-traumatic osteoarthritis, unspecified hip: Secondary | ICD-10-CM | POA: Diagnosis not present

## 2020-11-26 DIAGNOSIS — M6281 Muscle weakness (generalized): Secondary | ICD-10-CM | POA: Diagnosis not present

## 2020-11-28 DIAGNOSIS — K5790 Diverticulosis of intestine, part unspecified, without perforation or abscess without bleeding: Secondary | ICD-10-CM | POA: Diagnosis not present

## 2020-11-28 DIAGNOSIS — S42291D Other displaced fracture of upper end of right humerus, subsequent encounter for fracture with routine healing: Secondary | ICD-10-CM | POA: Diagnosis not present

## 2020-11-28 DIAGNOSIS — Z792 Long term (current) use of antibiotics: Secondary | ICD-10-CM | POA: Diagnosis not present

## 2020-11-28 DIAGNOSIS — Z48815 Encounter for surgical aftercare following surgery on the digestive system: Secondary | ICD-10-CM | POA: Diagnosis not present

## 2020-11-28 DIAGNOSIS — I10 Essential (primary) hypertension: Secondary | ICD-10-CM | POA: Diagnosis not present

## 2020-11-28 DIAGNOSIS — Z79891 Long term (current) use of opiate analgesic: Secondary | ICD-10-CM | POA: Diagnosis not present

## 2020-11-28 DIAGNOSIS — Z7289 Other problems related to lifestyle: Secondary | ICD-10-CM | POA: Diagnosis not present

## 2020-11-28 DIAGNOSIS — S42251D Displaced fracture of greater tuberosity of right humerus, subsequent encounter for fracture with routine healing: Secondary | ICD-10-CM | POA: Diagnosis not present

## 2020-11-28 DIAGNOSIS — Z9181 History of falling: Secondary | ICD-10-CM | POA: Diagnosis not present

## 2020-11-28 DIAGNOSIS — Z981 Arthrodesis status: Secondary | ICD-10-CM | POA: Diagnosis not present

## 2020-11-28 DIAGNOSIS — K631 Perforation of intestine (nontraumatic): Secondary | ICD-10-CM | POA: Diagnosis not present

## 2020-11-28 DIAGNOSIS — M1650 Unilateral post-traumatic osteoarthritis, unspecified hip: Secondary | ICD-10-CM | POA: Diagnosis not present

## 2020-11-28 DIAGNOSIS — K659 Peritonitis, unspecified: Secondary | ICD-10-CM | POA: Diagnosis not present

## 2020-11-28 DIAGNOSIS — K219 Gastro-esophageal reflux disease without esophagitis: Secondary | ICD-10-CM | POA: Diagnosis not present

## 2020-11-28 DIAGNOSIS — Z96653 Presence of artificial knee joint, bilateral: Secondary | ICD-10-CM | POA: Diagnosis not present

## 2020-11-28 DIAGNOSIS — R21 Rash and other nonspecific skin eruption: Secondary | ICD-10-CM | POA: Diagnosis not present

## 2020-11-28 DIAGNOSIS — G8929 Other chronic pain: Secondary | ICD-10-CM | POA: Diagnosis not present

## 2020-11-28 DIAGNOSIS — Z433 Encounter for attention to colostomy: Secondary | ICD-10-CM | POA: Diagnosis not present

## 2020-11-28 DIAGNOSIS — Z9049 Acquired absence of other specified parts of digestive tract: Secondary | ICD-10-CM | POA: Diagnosis not present

## 2020-11-28 DIAGNOSIS — E785 Hyperlipidemia, unspecified: Secondary | ICD-10-CM | POA: Diagnosis not present

## 2020-11-29 DIAGNOSIS — Z79891 Long term (current) use of opiate analgesic: Secondary | ICD-10-CM | POA: Diagnosis not present

## 2020-11-29 DIAGNOSIS — Z96653 Presence of artificial knee joint, bilateral: Secondary | ICD-10-CM | POA: Diagnosis not present

## 2020-11-29 DIAGNOSIS — S42291D Other displaced fracture of upper end of right humerus, subsequent encounter for fracture with routine healing: Secondary | ICD-10-CM | POA: Diagnosis not present

## 2020-11-29 DIAGNOSIS — Z9181 History of falling: Secondary | ICD-10-CM | POA: Diagnosis not present

## 2020-11-29 DIAGNOSIS — I10 Essential (primary) hypertension: Secondary | ICD-10-CM | POA: Diagnosis not present

## 2020-11-29 DIAGNOSIS — E785 Hyperlipidemia, unspecified: Secondary | ICD-10-CM | POA: Diagnosis not present

## 2020-11-29 DIAGNOSIS — Z48815 Encounter for surgical aftercare following surgery on the digestive system: Secondary | ICD-10-CM | POA: Diagnosis not present

## 2020-11-29 DIAGNOSIS — M1650 Unilateral post-traumatic osteoarthritis, unspecified hip: Secondary | ICD-10-CM | POA: Diagnosis not present

## 2020-11-29 DIAGNOSIS — G8929 Other chronic pain: Secondary | ICD-10-CM | POA: Diagnosis not present

## 2020-11-29 DIAGNOSIS — K219 Gastro-esophageal reflux disease without esophagitis: Secondary | ICD-10-CM | POA: Diagnosis not present

## 2020-11-29 DIAGNOSIS — K5790 Diverticulosis of intestine, part unspecified, without perforation or abscess without bleeding: Secondary | ICD-10-CM | POA: Diagnosis not present

## 2020-11-29 DIAGNOSIS — S42251D Displaced fracture of greater tuberosity of right humerus, subsequent encounter for fracture with routine healing: Secondary | ICD-10-CM | POA: Diagnosis not present

## 2020-11-29 DIAGNOSIS — K631 Perforation of intestine (nontraumatic): Secondary | ICD-10-CM | POA: Diagnosis not present

## 2020-11-29 DIAGNOSIS — Z9049 Acquired absence of other specified parts of digestive tract: Secondary | ICD-10-CM | POA: Diagnosis not present

## 2020-11-29 DIAGNOSIS — Z792 Long term (current) use of antibiotics: Secondary | ICD-10-CM | POA: Diagnosis not present

## 2020-11-29 DIAGNOSIS — K659 Peritonitis, unspecified: Secondary | ICD-10-CM | POA: Diagnosis not present

## 2020-11-29 DIAGNOSIS — Z7289 Other problems related to lifestyle: Secondary | ICD-10-CM | POA: Diagnosis not present

## 2020-11-29 DIAGNOSIS — R21 Rash and other nonspecific skin eruption: Secondary | ICD-10-CM | POA: Diagnosis not present

## 2020-11-29 DIAGNOSIS — Z433 Encounter for attention to colostomy: Secondary | ICD-10-CM | POA: Diagnosis not present

## 2020-11-29 DIAGNOSIS — Z981 Arthrodesis status: Secondary | ICD-10-CM | POA: Diagnosis not present

## 2020-12-01 DIAGNOSIS — M1650 Unilateral post-traumatic osteoarthritis, unspecified hip: Secondary | ICD-10-CM | POA: Diagnosis not present

## 2020-12-01 DIAGNOSIS — K659 Peritonitis, unspecified: Secondary | ICD-10-CM | POA: Diagnosis not present

## 2020-12-01 DIAGNOSIS — I10 Essential (primary) hypertension: Secondary | ICD-10-CM | POA: Diagnosis not present

## 2020-12-01 DIAGNOSIS — K219 Gastro-esophageal reflux disease without esophagitis: Secondary | ICD-10-CM | POA: Diagnosis not present

## 2020-12-01 DIAGNOSIS — Z9049 Acquired absence of other specified parts of digestive tract: Secondary | ICD-10-CM | POA: Diagnosis not present

## 2020-12-01 DIAGNOSIS — Z9181 History of falling: Secondary | ICD-10-CM | POA: Diagnosis not present

## 2020-12-01 DIAGNOSIS — K5790 Diverticulosis of intestine, part unspecified, without perforation or abscess without bleeding: Secondary | ICD-10-CM | POA: Diagnosis not present

## 2020-12-01 DIAGNOSIS — G8929 Other chronic pain: Secondary | ICD-10-CM | POA: Diagnosis not present

## 2020-12-01 DIAGNOSIS — Z7289 Other problems related to lifestyle: Secondary | ICD-10-CM | POA: Diagnosis not present

## 2020-12-01 DIAGNOSIS — Z79891 Long term (current) use of opiate analgesic: Secondary | ICD-10-CM | POA: Diagnosis not present

## 2020-12-01 DIAGNOSIS — Z96653 Presence of artificial knee joint, bilateral: Secondary | ICD-10-CM | POA: Diagnosis not present

## 2020-12-01 DIAGNOSIS — Z48815 Encounter for surgical aftercare following surgery on the digestive system: Secondary | ICD-10-CM | POA: Diagnosis not present

## 2020-12-01 DIAGNOSIS — E785 Hyperlipidemia, unspecified: Secondary | ICD-10-CM | POA: Diagnosis not present

## 2020-12-01 DIAGNOSIS — Z792 Long term (current) use of antibiotics: Secondary | ICD-10-CM | POA: Diagnosis not present

## 2020-12-01 DIAGNOSIS — R21 Rash and other nonspecific skin eruption: Secondary | ICD-10-CM | POA: Diagnosis not present

## 2020-12-01 DIAGNOSIS — S42251D Displaced fracture of greater tuberosity of right humerus, subsequent encounter for fracture with routine healing: Secondary | ICD-10-CM | POA: Diagnosis not present

## 2020-12-01 DIAGNOSIS — Z433 Encounter for attention to colostomy: Secondary | ICD-10-CM | POA: Diagnosis not present

## 2020-12-01 DIAGNOSIS — Z981 Arthrodesis status: Secondary | ICD-10-CM | POA: Diagnosis not present

## 2020-12-01 DIAGNOSIS — S42291D Other displaced fracture of upper end of right humerus, subsequent encounter for fracture with routine healing: Secondary | ICD-10-CM | POA: Diagnosis not present

## 2020-12-01 DIAGNOSIS — K631 Perforation of intestine (nontraumatic): Secondary | ICD-10-CM | POA: Diagnosis not present

## 2020-12-02 DIAGNOSIS — G8929 Other chronic pain: Secondary | ICD-10-CM | POA: Diagnosis not present

## 2020-12-02 DIAGNOSIS — S42251D Displaced fracture of greater tuberosity of right humerus, subsequent encounter for fracture with routine healing: Secondary | ICD-10-CM | POA: Diagnosis not present

## 2020-12-02 DIAGNOSIS — E785 Hyperlipidemia, unspecified: Secondary | ICD-10-CM | POA: Diagnosis not present

## 2020-12-02 DIAGNOSIS — M1650 Unilateral post-traumatic osteoarthritis, unspecified hip: Secondary | ICD-10-CM | POA: Diagnosis not present

## 2020-12-02 DIAGNOSIS — Z7289 Other problems related to lifestyle: Secondary | ICD-10-CM | POA: Diagnosis not present

## 2020-12-02 DIAGNOSIS — Z9181 History of falling: Secondary | ICD-10-CM | POA: Diagnosis not present

## 2020-12-02 DIAGNOSIS — I10 Essential (primary) hypertension: Secondary | ICD-10-CM | POA: Diagnosis not present

## 2020-12-02 DIAGNOSIS — K219 Gastro-esophageal reflux disease without esophagitis: Secondary | ICD-10-CM | POA: Diagnosis not present

## 2020-12-02 DIAGNOSIS — Z792 Long term (current) use of antibiotics: Secondary | ICD-10-CM | POA: Diagnosis not present

## 2020-12-02 DIAGNOSIS — R21 Rash and other nonspecific skin eruption: Secondary | ICD-10-CM | POA: Diagnosis not present

## 2020-12-02 DIAGNOSIS — K659 Peritonitis, unspecified: Secondary | ICD-10-CM | POA: Diagnosis not present

## 2020-12-02 DIAGNOSIS — Z48815 Encounter for surgical aftercare following surgery on the digestive system: Secondary | ICD-10-CM | POA: Diagnosis not present

## 2020-12-02 DIAGNOSIS — Z9049 Acquired absence of other specified parts of digestive tract: Secondary | ICD-10-CM | POA: Diagnosis not present

## 2020-12-02 DIAGNOSIS — Z433 Encounter for attention to colostomy: Secondary | ICD-10-CM | POA: Diagnosis not present

## 2020-12-02 DIAGNOSIS — S42291D Other displaced fracture of upper end of right humerus, subsequent encounter for fracture with routine healing: Secondary | ICD-10-CM | POA: Diagnosis not present

## 2020-12-02 DIAGNOSIS — K631 Perforation of intestine (nontraumatic): Secondary | ICD-10-CM | POA: Diagnosis not present

## 2020-12-02 DIAGNOSIS — K5790 Diverticulosis of intestine, part unspecified, without perforation or abscess without bleeding: Secondary | ICD-10-CM | POA: Diagnosis not present

## 2020-12-02 DIAGNOSIS — Z981 Arthrodesis status: Secondary | ICD-10-CM | POA: Diagnosis not present

## 2020-12-02 DIAGNOSIS — Z79891 Long term (current) use of opiate analgesic: Secondary | ICD-10-CM | POA: Diagnosis not present

## 2020-12-02 DIAGNOSIS — Z96653 Presence of artificial knee joint, bilateral: Secondary | ICD-10-CM | POA: Diagnosis not present

## 2020-12-07 DIAGNOSIS — G8929 Other chronic pain: Secondary | ICD-10-CM | POA: Diagnosis not present

## 2020-12-07 DIAGNOSIS — Z433 Encounter for attention to colostomy: Secondary | ICD-10-CM | POA: Diagnosis not present

## 2020-12-07 DIAGNOSIS — E785 Hyperlipidemia, unspecified: Secondary | ICD-10-CM | POA: Diagnosis not present

## 2020-12-07 DIAGNOSIS — Z981 Arthrodesis status: Secondary | ICD-10-CM | POA: Diagnosis not present

## 2020-12-07 DIAGNOSIS — I10 Essential (primary) hypertension: Secondary | ICD-10-CM | POA: Diagnosis not present

## 2020-12-07 DIAGNOSIS — K5790 Diverticulosis of intestine, part unspecified, without perforation or abscess without bleeding: Secondary | ICD-10-CM | POA: Diagnosis not present

## 2020-12-07 DIAGNOSIS — M1650 Unilateral post-traumatic osteoarthritis, unspecified hip: Secondary | ICD-10-CM | POA: Diagnosis not present

## 2020-12-07 DIAGNOSIS — Z48815 Encounter for surgical aftercare following surgery on the digestive system: Secondary | ICD-10-CM | POA: Diagnosis not present

## 2020-12-07 DIAGNOSIS — K631 Perforation of intestine (nontraumatic): Secondary | ICD-10-CM | POA: Diagnosis not present

## 2020-12-07 DIAGNOSIS — S42291D Other displaced fracture of upper end of right humerus, subsequent encounter for fracture with routine healing: Secondary | ICD-10-CM | POA: Diagnosis not present

## 2020-12-07 DIAGNOSIS — Z79891 Long term (current) use of opiate analgesic: Secondary | ICD-10-CM | POA: Diagnosis not present

## 2020-12-07 DIAGNOSIS — K659 Peritonitis, unspecified: Secondary | ICD-10-CM | POA: Diagnosis not present

## 2020-12-07 DIAGNOSIS — Z9049 Acquired absence of other specified parts of digestive tract: Secondary | ICD-10-CM | POA: Diagnosis not present

## 2020-12-07 DIAGNOSIS — Z792 Long term (current) use of antibiotics: Secondary | ICD-10-CM | POA: Diagnosis not present

## 2020-12-07 DIAGNOSIS — K219 Gastro-esophageal reflux disease without esophagitis: Secondary | ICD-10-CM | POA: Diagnosis not present

## 2020-12-07 DIAGNOSIS — Z9181 History of falling: Secondary | ICD-10-CM | POA: Diagnosis not present

## 2020-12-07 DIAGNOSIS — Z96653 Presence of artificial knee joint, bilateral: Secondary | ICD-10-CM | POA: Diagnosis not present

## 2020-12-07 DIAGNOSIS — S42251D Displaced fracture of greater tuberosity of right humerus, subsequent encounter for fracture with routine healing: Secondary | ICD-10-CM | POA: Diagnosis not present

## 2020-12-07 DIAGNOSIS — Z7289 Other problems related to lifestyle: Secondary | ICD-10-CM | POA: Diagnosis not present

## 2020-12-07 DIAGNOSIS — R21 Rash and other nonspecific skin eruption: Secondary | ICD-10-CM | POA: Diagnosis not present

## 2020-12-08 ENCOUNTER — Telehealth: Payer: Self-pay | Admitting: Internal Medicine

## 2020-12-08 DIAGNOSIS — S42251D Displaced fracture of greater tuberosity of right humerus, subsequent encounter for fracture with routine healing: Secondary | ICD-10-CM | POA: Diagnosis not present

## 2020-12-08 DIAGNOSIS — Z9049 Acquired absence of other specified parts of digestive tract: Secondary | ICD-10-CM | POA: Diagnosis not present

## 2020-12-08 DIAGNOSIS — Z433 Encounter for attention to colostomy: Secondary | ICD-10-CM | POA: Diagnosis not present

## 2020-12-08 DIAGNOSIS — I10 Essential (primary) hypertension: Secondary | ICD-10-CM | POA: Diagnosis not present

## 2020-12-08 DIAGNOSIS — Z792 Long term (current) use of antibiotics: Secondary | ICD-10-CM | POA: Diagnosis not present

## 2020-12-08 DIAGNOSIS — K659 Peritonitis, unspecified: Secondary | ICD-10-CM | POA: Diagnosis not present

## 2020-12-08 DIAGNOSIS — Z96653 Presence of artificial knee joint, bilateral: Secondary | ICD-10-CM | POA: Diagnosis not present

## 2020-12-08 DIAGNOSIS — E785 Hyperlipidemia, unspecified: Secondary | ICD-10-CM | POA: Diagnosis not present

## 2020-12-08 DIAGNOSIS — Z79891 Long term (current) use of opiate analgesic: Secondary | ICD-10-CM | POA: Diagnosis not present

## 2020-12-08 DIAGNOSIS — Z48815 Encounter for surgical aftercare following surgery on the digestive system: Secondary | ICD-10-CM | POA: Diagnosis not present

## 2020-12-08 DIAGNOSIS — M1650 Unilateral post-traumatic osteoarthritis, unspecified hip: Secondary | ICD-10-CM | POA: Diagnosis not present

## 2020-12-08 DIAGNOSIS — Z981 Arthrodesis status: Secondary | ICD-10-CM | POA: Diagnosis not present

## 2020-12-08 DIAGNOSIS — Z9181 History of falling: Secondary | ICD-10-CM | POA: Diagnosis not present

## 2020-12-08 DIAGNOSIS — R21 Rash and other nonspecific skin eruption: Secondary | ICD-10-CM | POA: Diagnosis not present

## 2020-12-08 DIAGNOSIS — K5790 Diverticulosis of intestine, part unspecified, without perforation or abscess without bleeding: Secondary | ICD-10-CM | POA: Diagnosis not present

## 2020-12-08 DIAGNOSIS — K219 Gastro-esophageal reflux disease without esophagitis: Secondary | ICD-10-CM | POA: Diagnosis not present

## 2020-12-08 DIAGNOSIS — G8929 Other chronic pain: Secondary | ICD-10-CM | POA: Diagnosis not present

## 2020-12-08 DIAGNOSIS — K631 Perforation of intestine (nontraumatic): Secondary | ICD-10-CM | POA: Diagnosis not present

## 2020-12-08 DIAGNOSIS — S42291D Other displaced fracture of upper end of right humerus, subsequent encounter for fracture with routine healing: Secondary | ICD-10-CM | POA: Diagnosis not present

## 2020-12-08 DIAGNOSIS — T8189XA Other complications of procedures, not elsewhere classified, initial encounter: Secondary | ICD-10-CM | POA: Diagnosis not present

## 2020-12-08 DIAGNOSIS — Z7289 Other problems related to lifestyle: Secondary | ICD-10-CM | POA: Diagnosis not present

## 2020-12-08 NOTE — Telephone Encounter (Signed)
[place] noted.  I have not seen her since her hospitalization.  If there are other concerns she may need to be seen

## 2020-12-08 NOTE — Telephone Encounter (Signed)
Chloe Young w/ [facility] called and said that the patients wound on her abdomin has some yellow slush and she was wanting to let Dr. Quay Burow know. On the left side where he colostomy bag is swollen and full of hard stool. She told the patient to drink plenty or water and eat green food.

## 2020-12-09 NOTE — Telephone Encounter (Signed)
Message left for [facility] with [facility].

## 2020-12-09 NOTE — Telephone Encounter (Signed)
Anderson Malta called back to speak to Angela Nevin in regards to patient.    Please give her a call back at [phone]

## 2020-12-10 DIAGNOSIS — Z433 Encounter for attention to colostomy: Secondary | ICD-10-CM | POA: Diagnosis not present

## 2020-12-13 NOTE — Telephone Encounter (Signed)
Message left for Chloe Young that I was returning her call. If she calls back please see how patient is doing and if she needs to be seen.

## 2020-12-14 DIAGNOSIS — S42201A Unspecified fracture of upper end of right humerus, initial encounter for closed fracture: Secondary | ICD-10-CM | POA: Diagnosis not present

## 2020-12-15 DIAGNOSIS — Z981 Arthrodesis status: Secondary | ICD-10-CM | POA: Diagnosis not present

## 2020-12-15 DIAGNOSIS — K659 Peritonitis, unspecified: Secondary | ICD-10-CM | POA: Diagnosis not present

## 2020-12-15 DIAGNOSIS — I10 Essential (primary) hypertension: Secondary | ICD-10-CM | POA: Diagnosis not present

## 2020-12-15 DIAGNOSIS — K219 Gastro-esophageal reflux disease without esophagitis: Secondary | ICD-10-CM | POA: Diagnosis not present

## 2020-12-15 DIAGNOSIS — Z79891 Long term (current) use of opiate analgesic: Secondary | ICD-10-CM | POA: Diagnosis not present

## 2020-12-15 DIAGNOSIS — Z792 Long term (current) use of antibiotics: Secondary | ICD-10-CM | POA: Diagnosis not present

## 2020-12-15 DIAGNOSIS — S42251D Displaced fracture of greater tuberosity of right humerus, subsequent encounter for fracture with routine healing: Secondary | ICD-10-CM | POA: Diagnosis not present

## 2020-12-15 DIAGNOSIS — Z9049 Acquired absence of other specified parts of digestive tract: Secondary | ICD-10-CM | POA: Diagnosis not present

## 2020-12-15 DIAGNOSIS — K631 Perforation of intestine (nontraumatic): Secondary | ICD-10-CM | POA: Diagnosis not present

## 2020-12-15 DIAGNOSIS — E785 Hyperlipidemia, unspecified: Secondary | ICD-10-CM | POA: Diagnosis not present

## 2020-12-15 DIAGNOSIS — Z48815 Encounter for surgical aftercare following surgery on the digestive system: Secondary | ICD-10-CM | POA: Diagnosis not present

## 2020-12-15 DIAGNOSIS — S42291D Other displaced fracture of upper end of right humerus, subsequent encounter for fracture with routine healing: Secondary | ICD-10-CM | POA: Diagnosis not present

## 2020-12-15 DIAGNOSIS — R21 Rash and other nonspecific skin eruption: Secondary | ICD-10-CM | POA: Diagnosis not present

## 2020-12-15 DIAGNOSIS — Z96653 Presence of artificial knee joint, bilateral: Secondary | ICD-10-CM | POA: Diagnosis not present

## 2020-12-15 DIAGNOSIS — M1650 Unilateral post-traumatic osteoarthritis, unspecified hip: Secondary | ICD-10-CM | POA: Diagnosis not present

## 2020-12-15 DIAGNOSIS — K5790 Diverticulosis of intestine, part unspecified, without perforation or abscess without bleeding: Secondary | ICD-10-CM | POA: Diagnosis not present

## 2020-12-15 DIAGNOSIS — G8929 Other chronic pain: Secondary | ICD-10-CM | POA: Diagnosis not present

## 2020-12-15 DIAGNOSIS — Z7289 Other problems related to lifestyle: Secondary | ICD-10-CM | POA: Diagnosis not present

## 2020-12-15 DIAGNOSIS — Z433 Encounter for attention to colostomy: Secondary | ICD-10-CM | POA: Diagnosis not present

## 2020-12-15 DIAGNOSIS — Z9181 History of falling: Secondary | ICD-10-CM | POA: Diagnosis not present

## 2020-12-16 DIAGNOSIS — G8929 Other chronic pain: Secondary | ICD-10-CM | POA: Diagnosis not present

## 2020-12-16 DIAGNOSIS — Z7289 Other problems related to lifestyle: Secondary | ICD-10-CM | POA: Diagnosis not present

## 2020-12-16 DIAGNOSIS — Z981 Arthrodesis status: Secondary | ICD-10-CM | POA: Diagnosis not present

## 2020-12-16 DIAGNOSIS — Z792 Long term (current) use of antibiotics: Secondary | ICD-10-CM | POA: Diagnosis not present

## 2020-12-16 DIAGNOSIS — K219 Gastro-esophageal reflux disease without esophagitis: Secondary | ICD-10-CM | POA: Diagnosis not present

## 2020-12-16 DIAGNOSIS — Z79891 Long term (current) use of opiate analgesic: Secondary | ICD-10-CM | POA: Diagnosis not present

## 2020-12-16 DIAGNOSIS — Z433 Encounter for attention to colostomy: Secondary | ICD-10-CM | POA: Diagnosis not present

## 2020-12-16 DIAGNOSIS — K5790 Diverticulosis of intestine, part unspecified, without perforation or abscess without bleeding: Secondary | ICD-10-CM | POA: Diagnosis not present

## 2020-12-16 DIAGNOSIS — M1650 Unilateral post-traumatic osteoarthritis, unspecified hip: Secondary | ICD-10-CM | POA: Diagnosis not present

## 2020-12-16 DIAGNOSIS — S42251D Displaced fracture of greater tuberosity of right humerus, subsequent encounter for fracture with routine healing: Secondary | ICD-10-CM | POA: Diagnosis not present

## 2020-12-16 DIAGNOSIS — E785 Hyperlipidemia, unspecified: Secondary | ICD-10-CM | POA: Diagnosis not present

## 2020-12-16 DIAGNOSIS — Z9049 Acquired absence of other specified parts of digestive tract: Secondary | ICD-10-CM | POA: Diagnosis not present

## 2020-12-16 DIAGNOSIS — Z9181 History of falling: Secondary | ICD-10-CM | POA: Diagnosis not present

## 2020-12-16 DIAGNOSIS — K659 Peritonitis, unspecified: Secondary | ICD-10-CM | POA: Diagnosis not present

## 2020-12-16 DIAGNOSIS — Z96653 Presence of artificial knee joint, bilateral: Secondary | ICD-10-CM | POA: Diagnosis not present

## 2020-12-16 DIAGNOSIS — R21 Rash and other nonspecific skin eruption: Secondary | ICD-10-CM | POA: Diagnosis not present

## 2020-12-16 DIAGNOSIS — I10 Essential (primary) hypertension: Secondary | ICD-10-CM | POA: Diagnosis not present

## 2020-12-16 DIAGNOSIS — S42291D Other displaced fracture of upper end of right humerus, subsequent encounter for fracture with routine healing: Secondary | ICD-10-CM | POA: Diagnosis not present

## 2020-12-16 DIAGNOSIS — Z48815 Encounter for surgical aftercare following surgery on the digestive system: Secondary | ICD-10-CM | POA: Diagnosis not present

## 2020-12-16 DIAGNOSIS — K631 Perforation of intestine (nontraumatic): Secondary | ICD-10-CM | POA: Diagnosis not present

## 2020-12-20 DIAGNOSIS — R21 Rash and other nonspecific skin eruption: Secondary | ICD-10-CM | POA: Diagnosis not present

## 2020-12-20 DIAGNOSIS — S42251D Displaced fracture of greater tuberosity of right humerus, subsequent encounter for fracture with routine healing: Secondary | ICD-10-CM | POA: Diagnosis not present

## 2020-12-20 DIAGNOSIS — Z7289 Other problems related to lifestyle: Secondary | ICD-10-CM | POA: Diagnosis not present

## 2020-12-20 DIAGNOSIS — K5790 Diverticulosis of intestine, part unspecified, without perforation or abscess without bleeding: Secondary | ICD-10-CM | POA: Diagnosis not present

## 2020-12-20 DIAGNOSIS — G8929 Other chronic pain: Secondary | ICD-10-CM | POA: Diagnosis not present

## 2020-12-20 DIAGNOSIS — Z9181 History of falling: Secondary | ICD-10-CM | POA: Diagnosis not present

## 2020-12-20 DIAGNOSIS — I10 Essential (primary) hypertension: Secondary | ICD-10-CM | POA: Diagnosis not present

## 2020-12-20 DIAGNOSIS — Z48815 Encounter for surgical aftercare following surgery on the digestive system: Secondary | ICD-10-CM | POA: Diagnosis not present

## 2020-12-20 DIAGNOSIS — Z981 Arthrodesis status: Secondary | ICD-10-CM | POA: Diagnosis not present

## 2020-12-20 DIAGNOSIS — S42291D Other displaced fracture of upper end of right humerus, subsequent encounter for fracture with routine healing: Secondary | ICD-10-CM | POA: Diagnosis not present

## 2020-12-20 DIAGNOSIS — M1650 Unilateral post-traumatic osteoarthritis, unspecified hip: Secondary | ICD-10-CM | POA: Diagnosis not present

## 2020-12-20 DIAGNOSIS — K659 Peritonitis, unspecified: Secondary | ICD-10-CM | POA: Diagnosis not present

## 2020-12-20 DIAGNOSIS — Z792 Long term (current) use of antibiotics: Secondary | ICD-10-CM | POA: Diagnosis not present

## 2020-12-20 DIAGNOSIS — Z96653 Presence of artificial knee joint, bilateral: Secondary | ICD-10-CM | POA: Diagnosis not present

## 2020-12-20 DIAGNOSIS — E785 Hyperlipidemia, unspecified: Secondary | ICD-10-CM | POA: Diagnosis not present

## 2020-12-20 DIAGNOSIS — Z9049 Acquired absence of other specified parts of digestive tract: Secondary | ICD-10-CM | POA: Diagnosis not present

## 2020-12-20 DIAGNOSIS — K631 Perforation of intestine (nontraumatic): Secondary | ICD-10-CM | POA: Diagnosis not present

## 2020-12-20 DIAGNOSIS — Z433 Encounter for attention to colostomy: Secondary | ICD-10-CM | POA: Diagnosis not present

## 2020-12-20 DIAGNOSIS — Z79891 Long term (current) use of opiate analgesic: Secondary | ICD-10-CM | POA: Diagnosis not present

## 2020-12-20 DIAGNOSIS — K219 Gastro-esophageal reflux disease without esophagitis: Secondary | ICD-10-CM | POA: Diagnosis not present

## 2020-12-21 DIAGNOSIS — Z981 Arthrodesis status: Secondary | ICD-10-CM | POA: Diagnosis not present

## 2020-12-21 DIAGNOSIS — Z96653 Presence of artificial knee joint, bilateral: Secondary | ICD-10-CM | POA: Diagnosis not present

## 2020-12-21 DIAGNOSIS — S42291D Other displaced fracture of upper end of right humerus, subsequent encounter for fracture with routine healing: Secondary | ICD-10-CM | POA: Diagnosis not present

## 2020-12-21 DIAGNOSIS — S42251D Displaced fracture of greater tuberosity of right humerus, subsequent encounter for fracture with routine healing: Secondary | ICD-10-CM | POA: Diagnosis not present

## 2020-12-21 DIAGNOSIS — Z9049 Acquired absence of other specified parts of digestive tract: Secondary | ICD-10-CM | POA: Diagnosis not present

## 2020-12-21 DIAGNOSIS — Z79891 Long term (current) use of opiate analgesic: Secondary | ICD-10-CM | POA: Diagnosis not present

## 2020-12-21 DIAGNOSIS — Z7289 Other problems related to lifestyle: Secondary | ICD-10-CM | POA: Diagnosis not present

## 2020-12-21 DIAGNOSIS — K631 Perforation of intestine (nontraumatic): Secondary | ICD-10-CM | POA: Diagnosis not present

## 2020-12-21 DIAGNOSIS — K219 Gastro-esophageal reflux disease without esophagitis: Secondary | ICD-10-CM | POA: Diagnosis not present

## 2020-12-21 DIAGNOSIS — Z792 Long term (current) use of antibiotics: Secondary | ICD-10-CM | POA: Diagnosis not present

## 2020-12-21 DIAGNOSIS — G8929 Other chronic pain: Secondary | ICD-10-CM | POA: Diagnosis not present

## 2020-12-21 DIAGNOSIS — M1650 Unilateral post-traumatic osteoarthritis, unspecified hip: Secondary | ICD-10-CM | POA: Diagnosis not present

## 2020-12-21 DIAGNOSIS — K5790 Diverticulosis of intestine, part unspecified, without perforation or abscess without bleeding: Secondary | ICD-10-CM | POA: Diagnosis not present

## 2020-12-21 DIAGNOSIS — I10 Essential (primary) hypertension: Secondary | ICD-10-CM | POA: Diagnosis not present

## 2020-12-21 DIAGNOSIS — Z48815 Encounter for surgical aftercare following surgery on the digestive system: Secondary | ICD-10-CM | POA: Diagnosis not present

## 2020-12-21 DIAGNOSIS — E785 Hyperlipidemia, unspecified: Secondary | ICD-10-CM | POA: Diagnosis not present

## 2020-12-21 DIAGNOSIS — R21 Rash and other nonspecific skin eruption: Secondary | ICD-10-CM | POA: Diagnosis not present

## 2020-12-21 DIAGNOSIS — K659 Peritonitis, unspecified: Secondary | ICD-10-CM | POA: Diagnosis not present

## 2020-12-21 DIAGNOSIS — Z433 Encounter for attention to colostomy: Secondary | ICD-10-CM | POA: Diagnosis not present

## 2020-12-21 DIAGNOSIS — Z9181 History of falling: Secondary | ICD-10-CM | POA: Diagnosis not present

## 2020-12-22 ENCOUNTER — Other Ambulatory Visit: Payer: Self-pay | Admitting: Internal Medicine

## 2020-12-22 DIAGNOSIS — G8929 Other chronic pain: Secondary | ICD-10-CM | POA: Diagnosis not present

## 2020-12-22 DIAGNOSIS — I10 Essential (primary) hypertension: Secondary | ICD-10-CM | POA: Diagnosis not present

## 2020-12-22 DIAGNOSIS — K219 Gastro-esophageal reflux disease without esophagitis: Secondary | ICD-10-CM | POA: Diagnosis not present

## 2020-12-22 DIAGNOSIS — K659 Peritonitis, unspecified: Secondary | ICD-10-CM | POA: Diagnosis not present

## 2020-12-22 DIAGNOSIS — R21 Rash and other nonspecific skin eruption: Secondary | ICD-10-CM | POA: Diagnosis not present

## 2020-12-22 DIAGNOSIS — Z981 Arthrodesis status: Secondary | ICD-10-CM | POA: Diagnosis not present

## 2020-12-22 DIAGNOSIS — Z79891 Long term (current) use of opiate analgesic: Secondary | ICD-10-CM | POA: Diagnosis not present

## 2020-12-22 DIAGNOSIS — S42251D Displaced fracture of greater tuberosity of right humerus, subsequent encounter for fracture with routine healing: Secondary | ICD-10-CM | POA: Diagnosis not present

## 2020-12-22 DIAGNOSIS — K5790 Diverticulosis of intestine, part unspecified, without perforation or abscess without bleeding: Secondary | ICD-10-CM | POA: Diagnosis not present

## 2020-12-22 DIAGNOSIS — K631 Perforation of intestine (nontraumatic): Secondary | ICD-10-CM | POA: Diagnosis not present

## 2020-12-22 DIAGNOSIS — Z7289 Other problems related to lifestyle: Secondary | ICD-10-CM | POA: Diagnosis not present

## 2020-12-22 DIAGNOSIS — Z9181 History of falling: Secondary | ICD-10-CM | POA: Diagnosis not present

## 2020-12-22 DIAGNOSIS — Z96653 Presence of artificial knee joint, bilateral: Secondary | ICD-10-CM | POA: Diagnosis not present

## 2020-12-22 DIAGNOSIS — E785 Hyperlipidemia, unspecified: Secondary | ICD-10-CM | POA: Diagnosis not present

## 2020-12-22 DIAGNOSIS — Z433 Encounter for attention to colostomy: Secondary | ICD-10-CM | POA: Diagnosis not present

## 2020-12-22 DIAGNOSIS — Z792 Long term (current) use of antibiotics: Secondary | ICD-10-CM | POA: Diagnosis not present

## 2020-12-22 DIAGNOSIS — S42291D Other displaced fracture of upper end of right humerus, subsequent encounter for fracture with routine healing: Secondary | ICD-10-CM | POA: Diagnosis not present

## 2020-12-22 DIAGNOSIS — Z9049 Acquired absence of other specified parts of digestive tract: Secondary | ICD-10-CM | POA: Diagnosis not present

## 2020-12-22 DIAGNOSIS — Z48815 Encounter for surgical aftercare following surgery on the digestive system: Secondary | ICD-10-CM | POA: Diagnosis not present

## 2020-12-22 DIAGNOSIS — M1650 Unilateral post-traumatic osteoarthritis, unspecified hip: Secondary | ICD-10-CM | POA: Diagnosis not present

## 2020-12-28 DIAGNOSIS — K659 Peritonitis, unspecified: Secondary | ICD-10-CM | POA: Diagnosis not present

## 2020-12-28 DIAGNOSIS — M1650 Unilateral post-traumatic osteoarthritis, unspecified hip: Secondary | ICD-10-CM | POA: Diagnosis not present

## 2020-12-28 DIAGNOSIS — Z792 Long term (current) use of antibiotics: Secondary | ICD-10-CM | POA: Diagnosis not present

## 2020-12-28 DIAGNOSIS — I10 Essential (primary) hypertension: Secondary | ICD-10-CM | POA: Diagnosis not present

## 2020-12-28 DIAGNOSIS — K219 Gastro-esophageal reflux disease without esophagitis: Secondary | ICD-10-CM | POA: Diagnosis not present

## 2020-12-28 DIAGNOSIS — R21 Rash and other nonspecific skin eruption: Secondary | ICD-10-CM | POA: Diagnosis not present

## 2020-12-28 DIAGNOSIS — K631 Perforation of intestine (nontraumatic): Secondary | ICD-10-CM | POA: Diagnosis not present

## 2020-12-28 DIAGNOSIS — Z48815 Encounter for surgical aftercare following surgery on the digestive system: Secondary | ICD-10-CM | POA: Diagnosis not present

## 2020-12-28 DIAGNOSIS — Z7289 Other problems related to lifestyle: Secondary | ICD-10-CM | POA: Diagnosis not present

## 2020-12-28 DIAGNOSIS — K5790 Diverticulosis of intestine, part unspecified, without perforation or abscess without bleeding: Secondary | ICD-10-CM | POA: Diagnosis not present

## 2020-12-28 DIAGNOSIS — Z96653 Presence of artificial knee joint, bilateral: Secondary | ICD-10-CM | POA: Diagnosis not present

## 2020-12-28 DIAGNOSIS — S42291D Other displaced fracture of upper end of right humerus, subsequent encounter for fracture with routine healing: Secondary | ICD-10-CM | POA: Diagnosis not present

## 2020-12-28 DIAGNOSIS — Z9049 Acquired absence of other specified parts of digestive tract: Secondary | ICD-10-CM | POA: Diagnosis not present

## 2020-12-28 DIAGNOSIS — E785 Hyperlipidemia, unspecified: Secondary | ICD-10-CM | POA: Diagnosis not present

## 2020-12-28 DIAGNOSIS — Z981 Arthrodesis status: Secondary | ICD-10-CM | POA: Diagnosis not present

## 2020-12-28 DIAGNOSIS — G8929 Other chronic pain: Secondary | ICD-10-CM | POA: Diagnosis not present

## 2020-12-28 DIAGNOSIS — T8189XA Other complications of procedures, not elsewhere classified, initial encounter: Secondary | ICD-10-CM | POA: Diagnosis not present

## 2020-12-28 DIAGNOSIS — Z79891 Long term (current) use of opiate analgesic: Secondary | ICD-10-CM | POA: Diagnosis not present

## 2020-12-28 DIAGNOSIS — S42251D Displaced fracture of greater tuberosity of right humerus, subsequent encounter for fracture with routine healing: Secondary | ICD-10-CM | POA: Diagnosis not present

## 2020-12-28 DIAGNOSIS — Z433 Encounter for attention to colostomy: Secondary | ICD-10-CM | POA: Diagnosis not present

## 2020-12-28 DIAGNOSIS — Z9181 History of falling: Secondary | ICD-10-CM | POA: Diagnosis not present

## 2020-12-30 DIAGNOSIS — Z9181 History of falling: Secondary | ICD-10-CM | POA: Diagnosis not present

## 2020-12-30 DIAGNOSIS — M1650 Unilateral post-traumatic osteoarthritis, unspecified hip: Secondary | ICD-10-CM | POA: Diagnosis not present

## 2020-12-30 DIAGNOSIS — E785 Hyperlipidemia, unspecified: Secondary | ICD-10-CM | POA: Diagnosis not present

## 2020-12-30 DIAGNOSIS — Z7289 Other problems related to lifestyle: Secondary | ICD-10-CM | POA: Diagnosis not present

## 2020-12-30 DIAGNOSIS — Z792 Long term (current) use of antibiotics: Secondary | ICD-10-CM | POA: Diagnosis not present

## 2020-12-30 DIAGNOSIS — Z433 Encounter for attention to colostomy: Secondary | ICD-10-CM | POA: Diagnosis not present

## 2020-12-30 DIAGNOSIS — G8929 Other chronic pain: Secondary | ICD-10-CM | POA: Diagnosis not present

## 2020-12-30 DIAGNOSIS — K631 Perforation of intestine (nontraumatic): Secondary | ICD-10-CM | POA: Diagnosis not present

## 2020-12-30 DIAGNOSIS — Z48815 Encounter for surgical aftercare following surgery on the digestive system: Secondary | ICD-10-CM | POA: Diagnosis not present

## 2020-12-30 DIAGNOSIS — Z9049 Acquired absence of other specified parts of digestive tract: Secondary | ICD-10-CM | POA: Diagnosis not present

## 2020-12-30 DIAGNOSIS — Z981 Arthrodesis status: Secondary | ICD-10-CM | POA: Diagnosis not present

## 2020-12-30 DIAGNOSIS — S42291D Other displaced fracture of upper end of right humerus, subsequent encounter for fracture with routine healing: Secondary | ICD-10-CM | POA: Diagnosis not present

## 2020-12-30 DIAGNOSIS — K219 Gastro-esophageal reflux disease without esophagitis: Secondary | ICD-10-CM | POA: Diagnosis not present

## 2020-12-30 DIAGNOSIS — R21 Rash and other nonspecific skin eruption: Secondary | ICD-10-CM | POA: Diagnosis not present

## 2020-12-30 DIAGNOSIS — S42251D Displaced fracture of greater tuberosity of right humerus, subsequent encounter for fracture with routine healing: Secondary | ICD-10-CM | POA: Diagnosis not present

## 2020-12-30 DIAGNOSIS — I10 Essential (primary) hypertension: Secondary | ICD-10-CM | POA: Diagnosis not present

## 2020-12-30 DIAGNOSIS — K659 Peritonitis, unspecified: Secondary | ICD-10-CM | POA: Diagnosis not present

## 2020-12-30 DIAGNOSIS — Z79891 Long term (current) use of opiate analgesic: Secondary | ICD-10-CM | POA: Diagnosis not present

## 2020-12-30 DIAGNOSIS — Z96653 Presence of artificial knee joint, bilateral: Secondary | ICD-10-CM | POA: Diagnosis not present

## 2020-12-30 DIAGNOSIS — K5790 Diverticulosis of intestine, part unspecified, without perforation or abscess without bleeding: Secondary | ICD-10-CM | POA: Diagnosis not present

## 2020-12-31 DIAGNOSIS — Z433 Encounter for attention to colostomy: Secondary | ICD-10-CM | POA: Diagnosis not present

## 2020-12-31 DIAGNOSIS — K219 Gastro-esophageal reflux disease without esophagitis: Secondary | ICD-10-CM | POA: Diagnosis not present

## 2020-12-31 DIAGNOSIS — G8929 Other chronic pain: Secondary | ICD-10-CM | POA: Diagnosis not present

## 2020-12-31 DIAGNOSIS — Z9049 Acquired absence of other specified parts of digestive tract: Secondary | ICD-10-CM | POA: Diagnosis not present

## 2020-12-31 DIAGNOSIS — Z792 Long term (current) use of antibiotics: Secondary | ICD-10-CM | POA: Diagnosis not present

## 2020-12-31 DIAGNOSIS — E785 Hyperlipidemia, unspecified: Secondary | ICD-10-CM | POA: Diagnosis not present

## 2020-12-31 DIAGNOSIS — Z981 Arthrodesis status: Secondary | ICD-10-CM | POA: Diagnosis not present

## 2020-12-31 DIAGNOSIS — Z7289 Other problems related to lifestyle: Secondary | ICD-10-CM | POA: Diagnosis not present

## 2020-12-31 DIAGNOSIS — Z48815 Encounter for surgical aftercare following surgery on the digestive system: Secondary | ICD-10-CM | POA: Diagnosis not present

## 2020-12-31 DIAGNOSIS — K659 Peritonitis, unspecified: Secondary | ICD-10-CM | POA: Diagnosis not present

## 2020-12-31 DIAGNOSIS — Z9181 History of falling: Secondary | ICD-10-CM | POA: Diagnosis not present

## 2020-12-31 DIAGNOSIS — M1650 Unilateral post-traumatic osteoarthritis, unspecified hip: Secondary | ICD-10-CM | POA: Diagnosis not present

## 2020-12-31 DIAGNOSIS — Z79891 Long term (current) use of opiate analgesic: Secondary | ICD-10-CM | POA: Diagnosis not present

## 2020-12-31 DIAGNOSIS — K5790 Diverticulosis of intestine, part unspecified, without perforation or abscess without bleeding: Secondary | ICD-10-CM | POA: Diagnosis not present

## 2020-12-31 DIAGNOSIS — S42251D Displaced fracture of greater tuberosity of right humerus, subsequent encounter for fracture with routine healing: Secondary | ICD-10-CM | POA: Diagnosis not present

## 2020-12-31 DIAGNOSIS — I10 Essential (primary) hypertension: Secondary | ICD-10-CM | POA: Diagnosis not present

## 2020-12-31 DIAGNOSIS — R21 Rash and other nonspecific skin eruption: Secondary | ICD-10-CM | POA: Diagnosis not present

## 2020-12-31 DIAGNOSIS — Z96653 Presence of artificial knee joint, bilateral: Secondary | ICD-10-CM | POA: Diagnosis not present

## 2020-12-31 DIAGNOSIS — K631 Perforation of intestine (nontraumatic): Secondary | ICD-10-CM | POA: Diagnosis not present

## 2020-12-31 DIAGNOSIS — S42291D Other displaced fracture of upper end of right humerus, subsequent encounter for fracture with routine healing: Secondary | ICD-10-CM | POA: Diagnosis not present

## 2021-01-04 DIAGNOSIS — I10 Essential (primary) hypertension: Secondary | ICD-10-CM | POA: Diagnosis not present

## 2021-01-04 DIAGNOSIS — Z7289 Other problems related to lifestyle: Secondary | ICD-10-CM | POA: Diagnosis not present

## 2021-01-04 DIAGNOSIS — T8189XA Other complications of procedures, not elsewhere classified, initial encounter: Secondary | ICD-10-CM | POA: Diagnosis not present

## 2021-01-04 DIAGNOSIS — K5790 Diverticulosis of intestine, part unspecified, without perforation or abscess without bleeding: Secondary | ICD-10-CM | POA: Diagnosis not present

## 2021-01-04 DIAGNOSIS — S42291D Other displaced fracture of upper end of right humerus, subsequent encounter for fracture with routine healing: Secondary | ICD-10-CM | POA: Diagnosis not present

## 2021-01-04 DIAGNOSIS — Z792 Long term (current) use of antibiotics: Secondary | ICD-10-CM | POA: Diagnosis not present

## 2021-01-04 DIAGNOSIS — Z9049 Acquired absence of other specified parts of digestive tract: Secondary | ICD-10-CM | POA: Diagnosis not present

## 2021-01-04 DIAGNOSIS — Z9181 History of falling: Secondary | ICD-10-CM | POA: Diagnosis not present

## 2021-01-04 DIAGNOSIS — K659 Peritonitis, unspecified: Secondary | ICD-10-CM | POA: Diagnosis not present

## 2021-01-04 DIAGNOSIS — E785 Hyperlipidemia, unspecified: Secondary | ICD-10-CM | POA: Diagnosis not present

## 2021-01-04 DIAGNOSIS — G8929 Other chronic pain: Secondary | ICD-10-CM | POA: Diagnosis not present

## 2021-01-04 DIAGNOSIS — K219 Gastro-esophageal reflux disease without esophagitis: Secondary | ICD-10-CM | POA: Diagnosis not present

## 2021-01-04 DIAGNOSIS — Z433 Encounter for attention to colostomy: Secondary | ICD-10-CM | POA: Diagnosis not present

## 2021-01-04 DIAGNOSIS — Z79891 Long term (current) use of opiate analgesic: Secondary | ICD-10-CM | POA: Diagnosis not present

## 2021-01-04 DIAGNOSIS — Z48815 Encounter for surgical aftercare following surgery on the digestive system: Secondary | ICD-10-CM | POA: Diagnosis not present

## 2021-01-04 DIAGNOSIS — K631 Perforation of intestine (nontraumatic): Secondary | ICD-10-CM | POA: Diagnosis not present

## 2021-01-04 DIAGNOSIS — R21 Rash and other nonspecific skin eruption: Secondary | ICD-10-CM | POA: Diagnosis not present

## 2021-01-04 DIAGNOSIS — S42251D Displaced fracture of greater tuberosity of right humerus, subsequent encounter for fracture with routine healing: Secondary | ICD-10-CM | POA: Diagnosis not present

## 2021-01-04 DIAGNOSIS — Z981 Arthrodesis status: Secondary | ICD-10-CM | POA: Diagnosis not present

## 2021-01-04 DIAGNOSIS — M1650 Unilateral post-traumatic osteoarthritis, unspecified hip: Secondary | ICD-10-CM | POA: Diagnosis not present

## 2021-01-04 DIAGNOSIS — Z96653 Presence of artificial knee joint, bilateral: Secondary | ICD-10-CM | POA: Diagnosis not present

## 2021-01-06 ENCOUNTER — Telehealth: Payer: Self-pay | Admitting: Pharmacist

## 2021-01-06 DIAGNOSIS — Z792 Long term (current) use of antibiotics: Secondary | ICD-10-CM | POA: Diagnosis not present

## 2021-01-06 DIAGNOSIS — Z9049 Acquired absence of other specified parts of digestive tract: Secondary | ICD-10-CM | POA: Diagnosis not present

## 2021-01-06 DIAGNOSIS — M1650 Unilateral post-traumatic osteoarthritis, unspecified hip: Secondary | ICD-10-CM | POA: Diagnosis not present

## 2021-01-06 DIAGNOSIS — K631 Perforation of intestine (nontraumatic): Secondary | ICD-10-CM | POA: Diagnosis not present

## 2021-01-06 DIAGNOSIS — G8929 Other chronic pain: Secondary | ICD-10-CM | POA: Diagnosis not present

## 2021-01-06 DIAGNOSIS — Z9181 History of falling: Secondary | ICD-10-CM | POA: Diagnosis not present

## 2021-01-06 DIAGNOSIS — Z48815 Encounter for surgical aftercare following surgery on the digestive system: Secondary | ICD-10-CM | POA: Diagnosis not present

## 2021-01-06 DIAGNOSIS — Z96653 Presence of artificial knee joint, bilateral: Secondary | ICD-10-CM | POA: Diagnosis not present

## 2021-01-06 DIAGNOSIS — Z79891 Long term (current) use of opiate analgesic: Secondary | ICD-10-CM | POA: Diagnosis not present

## 2021-01-06 DIAGNOSIS — Z7289 Other problems related to lifestyle: Secondary | ICD-10-CM | POA: Diagnosis not present

## 2021-01-06 DIAGNOSIS — K5790 Diverticulosis of intestine, part unspecified, without perforation or abscess without bleeding: Secondary | ICD-10-CM | POA: Diagnosis not present

## 2021-01-06 DIAGNOSIS — I10 Essential (primary) hypertension: Secondary | ICD-10-CM | POA: Diagnosis not present

## 2021-01-06 DIAGNOSIS — S42291D Other displaced fracture of upper end of right humerus, subsequent encounter for fracture with routine healing: Secondary | ICD-10-CM | POA: Diagnosis not present

## 2021-01-06 DIAGNOSIS — Z981 Arthrodesis status: Secondary | ICD-10-CM | POA: Diagnosis not present

## 2021-01-06 DIAGNOSIS — S42251D Displaced fracture of greater tuberosity of right humerus, subsequent encounter for fracture with routine healing: Secondary | ICD-10-CM | POA: Diagnosis not present

## 2021-01-06 DIAGNOSIS — R21 Rash and other nonspecific skin eruption: Secondary | ICD-10-CM | POA: Diagnosis not present

## 2021-01-06 DIAGNOSIS — E785 Hyperlipidemia, unspecified: Secondary | ICD-10-CM | POA: Diagnosis not present

## 2021-01-06 DIAGNOSIS — Z433 Encounter for attention to colostomy: Secondary | ICD-10-CM | POA: Diagnosis not present

## 2021-01-06 DIAGNOSIS — K219 Gastro-esophageal reflux disease without esophagitis: Secondary | ICD-10-CM | POA: Diagnosis not present

## 2021-01-06 DIAGNOSIS — K659 Peritonitis, unspecified: Secondary | ICD-10-CM | POA: Diagnosis not present

## 2021-01-06 NOTE — Progress Notes (Signed)
Chronic Care Management Pharmacy Assistant   Name: YASMEEN MANKA  MRN: [id number] DOB: [birth year]  Reason for Encounter: General Adherence Call  Patient Questions:  1.  Have you seen any other providers since your last visit? Yes, patient was admitted to [facility] 11/08/20 until 11/30 /21  2.  Any changes in your medicines or health? No    PCP : Binnie Rail, MD  Allergies:  No Known Allergies  Medications: Outpatient Encounter Medications as of 01/06/2021  Medication Sig Note   acetaminophen (TYLENOL) 500 MG tablet Take 1 tablet (500 mg total) by mouth every 6 (six) hours as needed for moderate pain.    alendronate (FOSAMAX) 70 MG tablet TAKE 1 TABLET BY MOUTH  EVERY 7 DAYS TAKE WITH A  FULL GLASS OF WATER ON AN  EMPTY STOMACH    Calcium Carbonate-Vitamin D (CALCIUM + D PO) Take 1 tablet by mouth daily.  07/25/2016: Takes 2   fluticasone (FLONASE) 50 MCG/ACT nasal spray USE 1 SPRAY IN BOTH  NOSTRILS TWICE DAILY (Patient taking differently: Place 1 spray into both nostrils in the morning and at bedtime. )    hydrochlorothiazide (MICROZIDE) 12.5 MG capsule TAKE 1 CAPSULE BY MOUTH  DAILY    LYRICA 150 MG capsule Take 150 mg by mouth 2 (two) times daily.  06/15/2016: Received from: External Pharmacy   metoprolol tartrate (LOPRESSOR) 50 MG tablet TAKE ONE-HALF TABLET BY  MOUTH TWICE DAILY    Multiple Vitamin (MULITIVITAMIN WITH MINERALS) TABS Take 1 tablet by mouth daily.     omeprazole (PRILOSEC) 20 MG capsule TAKE 1 CAPSULE BY MOUTH  DAILY    oxyCODONE (OXY IR/ROXICODONE) 5 MG immediate release tablet Take 1 tablet (5 mg total) by mouth every 6 (six) hours as needed for severe pain.    rosuvastatin (CRESTOR) 5 MG tablet TAKE 1 TABLET BY MOUTH  DAILY    topiramate (TOPAMAX) 100 MG tablet Take 2 tablets by mouth 2 (two) times daily. 06/15/2016: Received from: External Pharmacy   triamcinolone (NASACORT) 55 MCG/ACT AERO nasal inhaler Place 2 sprays into the nose  daily.    triamcinolone cream (KENALOG) 0.5 % APPLY 1 APPLICATION  TOPICALLY 2 (TWO) TIMES  DAILY AS NEEDED. (Patient taking differently: Apply 1 application topically 2 (two) times daily as needed (rash). )    No facility-administered encounter medications on file as of 01/06/2021.    Current Diagnosis: Patient Active Problem List   Diagnosis Date Noted   Peritonitis ([place]) 11/10/2020   Severe sepsis ([place]) 11/08/2020   Perforated abdominal viscus    Chronic nasal congestion 03/10/2020   Lumbar radiculopathy 06/15/2016   Cervicogenic headache 06/15/2016   Osteopenia 06/15/2016   GERD (gastroesophageal reflux disease) 06/15/2016   Fasting hyperglycemia 11/25/2013   Nonspecific abnormal electrocardiogram (ECG) (EKG) 05/28/2012   Cervical post-laminectomy syndrome 03/18/2012   Osteoarthritis of both knees 03/18/2012   RAYNAUD'S SYNDROME 10/11/2010   NECK PAIN, CHRONIC 04/19/2010   Hyperlipidemia 04/19/2009   Essential hypertension 04/19/2009    Goals Addressed   None     Follow-Up:  Pharmacist Review    A general adherence wellness call was made to Ms. Brow to ask how she has been doing since she last spoke with the clinical pharmacist Mendel Ryder. The patient states that she has been doing well but did state that she was in the hospital in November. She states that since then she has had some medical issues and has been home bound. She stated that when  they come they usually take her vital signs and her blood pressure has been good when they take it. Sh also states that she has had physical therapy on her arm that she broke right before she went to the hospital, this was the last day for therapy. The patient states that she does not have any problems getting her medications from the pharmacy, they come through the mail from [facility]. She states that she is not having any side effects from medications. The patient states that as far as her health Dr. Quay Burow knows all about  her health. I let the patient know that I will pass along the information to the clinical pharmacist Mendel Ryder.   Wendy Poet, [place] [phone]

## 2021-01-14 DIAGNOSIS — Z7289 Other problems related to lifestyle: Secondary | ICD-10-CM | POA: Diagnosis not present

## 2021-01-14 DIAGNOSIS — R21 Rash and other nonspecific skin eruption: Secondary | ICD-10-CM | POA: Diagnosis not present

## 2021-01-14 DIAGNOSIS — Z48815 Encounter for surgical aftercare following surgery on the digestive system: Secondary | ICD-10-CM | POA: Diagnosis not present

## 2021-01-14 DIAGNOSIS — Z9049 Acquired absence of other specified parts of digestive tract: Secondary | ICD-10-CM | POA: Diagnosis not present

## 2021-01-14 DIAGNOSIS — K5790 Diverticulosis of intestine, part unspecified, without perforation or abscess without bleeding: Secondary | ICD-10-CM | POA: Diagnosis not present

## 2021-01-14 DIAGNOSIS — Z9181 History of falling: Secondary | ICD-10-CM | POA: Diagnosis not present

## 2021-01-14 DIAGNOSIS — Z96653 Presence of artificial knee joint, bilateral: Secondary | ICD-10-CM | POA: Diagnosis not present

## 2021-01-14 DIAGNOSIS — Z433 Encounter for attention to colostomy: Secondary | ICD-10-CM | POA: Diagnosis not present

## 2021-01-14 DIAGNOSIS — M1650 Unilateral post-traumatic osteoarthritis, unspecified hip: Secondary | ICD-10-CM | POA: Diagnosis not present

## 2021-01-14 DIAGNOSIS — Z79891 Long term (current) use of opiate analgesic: Secondary | ICD-10-CM | POA: Diagnosis not present

## 2021-01-14 DIAGNOSIS — G8929 Other chronic pain: Secondary | ICD-10-CM | POA: Diagnosis not present

## 2021-01-14 DIAGNOSIS — Z981 Arthrodesis status: Secondary | ICD-10-CM | POA: Diagnosis not present

## 2021-01-14 DIAGNOSIS — K219 Gastro-esophageal reflux disease without esophagitis: Secondary | ICD-10-CM | POA: Diagnosis not present

## 2021-01-14 DIAGNOSIS — E785 Hyperlipidemia, unspecified: Secondary | ICD-10-CM | POA: Diagnosis not present

## 2021-01-14 DIAGNOSIS — S42251D Displaced fracture of greater tuberosity of right humerus, subsequent encounter for fracture with routine healing: Secondary | ICD-10-CM | POA: Diagnosis not present

## 2021-01-14 DIAGNOSIS — S42291D Other displaced fracture of upper end of right humerus, subsequent encounter for fracture with routine healing: Secondary | ICD-10-CM | POA: Diagnosis not present

## 2021-01-14 DIAGNOSIS — Z792 Long term (current) use of antibiotics: Secondary | ICD-10-CM | POA: Diagnosis not present

## 2021-01-14 DIAGNOSIS — I10 Essential (primary) hypertension: Secondary | ICD-10-CM | POA: Diagnosis not present

## 2021-01-14 DIAGNOSIS — K659 Peritonitis, unspecified: Secondary | ICD-10-CM | POA: Diagnosis not present

## 2021-01-14 DIAGNOSIS — K631 Perforation of intestine (nontraumatic): Secondary | ICD-10-CM | POA: Diagnosis not present

## 2021-01-17 DIAGNOSIS — T8189XA Other complications of procedures, not elsewhere classified, initial encounter: Secondary | ICD-10-CM | POA: Diagnosis not present

## 2021-01-18 DIAGNOSIS — G8929 Other chronic pain: Secondary | ICD-10-CM | POA: Diagnosis not present

## 2021-01-18 DIAGNOSIS — Z9049 Acquired absence of other specified parts of digestive tract: Secondary | ICD-10-CM | POA: Diagnosis not present

## 2021-01-18 DIAGNOSIS — Z79891 Long term (current) use of opiate analgesic: Secondary | ICD-10-CM | POA: Diagnosis not present

## 2021-01-18 DIAGNOSIS — Z433 Encounter for attention to colostomy: Secondary | ICD-10-CM | POA: Diagnosis not present

## 2021-01-18 DIAGNOSIS — K219 Gastro-esophageal reflux disease without esophagitis: Secondary | ICD-10-CM | POA: Diagnosis not present

## 2021-01-18 DIAGNOSIS — Z9181 History of falling: Secondary | ICD-10-CM | POA: Diagnosis not present

## 2021-01-18 DIAGNOSIS — Z792 Long term (current) use of antibiotics: Secondary | ICD-10-CM | POA: Diagnosis not present

## 2021-01-18 DIAGNOSIS — E785 Hyperlipidemia, unspecified: Secondary | ICD-10-CM | POA: Diagnosis not present

## 2021-01-18 DIAGNOSIS — Z7289 Other problems related to lifestyle: Secondary | ICD-10-CM | POA: Diagnosis not present

## 2021-01-18 DIAGNOSIS — I10 Essential (primary) hypertension: Secondary | ICD-10-CM | POA: Diagnosis not present

## 2021-01-18 DIAGNOSIS — Z981 Arthrodesis status: Secondary | ICD-10-CM | POA: Diagnosis not present

## 2021-01-18 DIAGNOSIS — Z96653 Presence of artificial knee joint, bilateral: Secondary | ICD-10-CM | POA: Diagnosis not present

## 2021-01-18 DIAGNOSIS — R21 Rash and other nonspecific skin eruption: Secondary | ICD-10-CM | POA: Diagnosis not present

## 2021-01-18 DIAGNOSIS — M1650 Unilateral post-traumatic osteoarthritis, unspecified hip: Secondary | ICD-10-CM | POA: Diagnosis not present

## 2021-01-18 DIAGNOSIS — S42291D Other displaced fracture of upper end of right humerus, subsequent encounter for fracture with routine healing: Secondary | ICD-10-CM | POA: Diagnosis not present

## 2021-01-18 DIAGNOSIS — K5790 Diverticulosis of intestine, part unspecified, without perforation or abscess without bleeding: Secondary | ICD-10-CM | POA: Diagnosis not present

## 2021-01-18 DIAGNOSIS — K659 Peritonitis, unspecified: Secondary | ICD-10-CM | POA: Diagnosis not present

## 2021-01-18 DIAGNOSIS — S42251D Displaced fracture of greater tuberosity of right humerus, subsequent encounter for fracture with routine healing: Secondary | ICD-10-CM | POA: Diagnosis not present

## 2021-01-18 DIAGNOSIS — K631 Perforation of intestine (nontraumatic): Secondary | ICD-10-CM | POA: Diagnosis not present

## 2021-01-18 DIAGNOSIS — Z48815 Encounter for surgical aftercare following surgery on the digestive system: Secondary | ICD-10-CM | POA: Diagnosis not present

## 2021-01-19 DIAGNOSIS — Z48815 Encounter for surgical aftercare following surgery on the digestive system: Secondary | ICD-10-CM | POA: Diagnosis not present

## 2021-01-19 DIAGNOSIS — S31114D Laceration without foreign body of abdominal wall, left lower quadrant without penetration into peritoneal cavity, subsequent encounter: Secondary | ICD-10-CM | POA: Diagnosis not present

## 2021-01-24 NOTE — Telephone Encounter (Signed)
Reviewed chart and insurance data for medication adherence. Patient does not have any gaps in adherence and is not in danger of failing any Medicare adherence measures. No further action required.  4 minutes spent in review, coordination, and documentation.  Charlton Haws, [facility] 01/24/21 1:05 PM

## 2021-01-25 ENCOUNTER — Telehealth: Payer: Self-pay

## 2021-01-25 DIAGNOSIS — M25511 Pain in right shoulder: Secondary | ICD-10-CM | POA: Diagnosis not present

## 2021-01-25 NOTE — Telephone Encounter (Signed)
Pt is returning a missed call from the nurse. 

## 2021-01-25 NOTE — Telephone Encounter (Signed)
Left message for patient to call back to schedule colonoscopy with prep below as indicated.

## 2021-01-25 NOTE — Telephone Encounter (Signed)
-----   Message from Chloe Artist, MD sent at 01/24/2021  3:38 PM EST ----- Chloe Young, We will contact her to schedule. Thanks, Chloe Young, She needs a standard bowel prep PO and 2 Fleets enemas PR for colonoscopy. Chloe ----- Message ----- From: Chloe Klein, MD Sent: 01/24/2021   3:03 PM EST To: Chloe Artist, MD  This lady had perforated diverticulitis in November and had hartmann's procedure. Can you do colonoscopy via stoma and eval rectum in the next few months? Tx FB

## 2021-01-26 DIAGNOSIS — Z7289 Other problems related to lifestyle: Secondary | ICD-10-CM | POA: Diagnosis not present

## 2021-01-26 DIAGNOSIS — Z9049 Acquired absence of other specified parts of digestive tract: Secondary | ICD-10-CM | POA: Diagnosis not present

## 2021-01-26 DIAGNOSIS — Z981 Arthrodesis status: Secondary | ICD-10-CM | POA: Diagnosis not present

## 2021-01-26 DIAGNOSIS — G8929 Other chronic pain: Secondary | ICD-10-CM | POA: Diagnosis not present

## 2021-01-26 DIAGNOSIS — Z9181 History of falling: Secondary | ICD-10-CM | POA: Diagnosis not present

## 2021-01-26 DIAGNOSIS — K659 Peritonitis, unspecified: Secondary | ICD-10-CM | POA: Diagnosis not present

## 2021-01-26 DIAGNOSIS — Z48815 Encounter for surgical aftercare following surgery on the digestive system: Secondary | ICD-10-CM | POA: Diagnosis not present

## 2021-01-26 DIAGNOSIS — K5790 Diverticulosis of intestine, part unspecified, without perforation or abscess without bleeding: Secondary | ICD-10-CM | POA: Diagnosis not present

## 2021-01-26 DIAGNOSIS — E785 Hyperlipidemia, unspecified: Secondary | ICD-10-CM | POA: Diagnosis not present

## 2021-01-26 DIAGNOSIS — Z433 Encounter for attention to colostomy: Secondary | ICD-10-CM | POA: Diagnosis not present

## 2021-01-26 DIAGNOSIS — I10 Essential (primary) hypertension: Secondary | ICD-10-CM | POA: Diagnosis not present

## 2021-01-26 DIAGNOSIS — S42291D Other displaced fracture of upper end of right humerus, subsequent encounter for fracture with routine healing: Secondary | ICD-10-CM | POA: Diagnosis not present

## 2021-01-26 DIAGNOSIS — Z792 Long term (current) use of antibiotics: Secondary | ICD-10-CM | POA: Diagnosis not present

## 2021-01-26 DIAGNOSIS — Z79891 Long term (current) use of opiate analgesic: Secondary | ICD-10-CM | POA: Diagnosis not present

## 2021-01-26 DIAGNOSIS — K219 Gastro-esophageal reflux disease without esophagitis: Secondary | ICD-10-CM | POA: Diagnosis not present

## 2021-01-26 DIAGNOSIS — M1650 Unilateral post-traumatic osteoarthritis, unspecified hip: Secondary | ICD-10-CM | POA: Diagnosis not present

## 2021-01-26 DIAGNOSIS — S42251D Displaced fracture of greater tuberosity of right humerus, subsequent encounter for fracture with routine healing: Secondary | ICD-10-CM | POA: Diagnosis not present

## 2021-01-26 DIAGNOSIS — K631 Perforation of intestine (nontraumatic): Secondary | ICD-10-CM | POA: Diagnosis not present

## 2021-01-26 DIAGNOSIS — Z96653 Presence of artificial knee joint, bilateral: Secondary | ICD-10-CM | POA: Diagnosis not present

## 2021-01-26 DIAGNOSIS — R21 Rash and other nonspecific skin eruption: Secondary | ICD-10-CM | POA: Diagnosis not present

## 2021-01-28 DIAGNOSIS — T8189XA Other complications of procedures, not elsewhere classified, initial encounter: Secondary | ICD-10-CM | POA: Diagnosis not present

## 2021-01-28 NOTE — Telephone Encounter (Signed)
Patient has been scheduled for 2/21 and 3/9 for pre-visit and procedures.

## 2021-02-01 DIAGNOSIS — G8929 Other chronic pain: Secondary | ICD-10-CM | POA: Diagnosis not present

## 2021-02-01 DIAGNOSIS — K631 Perforation of intestine (nontraumatic): Secondary | ICD-10-CM | POA: Diagnosis not present

## 2021-02-01 DIAGNOSIS — Z9049 Acquired absence of other specified parts of digestive tract: Secondary | ICD-10-CM | POA: Diagnosis not present

## 2021-02-01 DIAGNOSIS — Z9181 History of falling: Secondary | ICD-10-CM | POA: Diagnosis not present

## 2021-02-01 DIAGNOSIS — Z48815 Encounter for surgical aftercare following surgery on the digestive system: Secondary | ICD-10-CM | POA: Diagnosis not present

## 2021-02-01 DIAGNOSIS — S42291D Other displaced fracture of upper end of right humerus, subsequent encounter for fracture with routine healing: Secondary | ICD-10-CM | POA: Diagnosis not present

## 2021-02-01 DIAGNOSIS — Z433 Encounter for attention to colostomy: Secondary | ICD-10-CM | POA: Diagnosis not present

## 2021-02-01 DIAGNOSIS — K219 Gastro-esophageal reflux disease without esophagitis: Secondary | ICD-10-CM | POA: Diagnosis not present

## 2021-02-01 DIAGNOSIS — Z96653 Presence of artificial knee joint, bilateral: Secondary | ICD-10-CM | POA: Diagnosis not present

## 2021-02-01 DIAGNOSIS — S42251D Displaced fracture of greater tuberosity of right humerus, subsequent encounter for fracture with routine healing: Secondary | ICD-10-CM | POA: Diagnosis not present

## 2021-02-01 DIAGNOSIS — Z79891 Long term (current) use of opiate analgesic: Secondary | ICD-10-CM | POA: Diagnosis not present

## 2021-02-01 DIAGNOSIS — E785 Hyperlipidemia, unspecified: Secondary | ICD-10-CM | POA: Diagnosis not present

## 2021-02-01 DIAGNOSIS — M1650 Unilateral post-traumatic osteoarthritis, unspecified hip: Secondary | ICD-10-CM | POA: Diagnosis not present

## 2021-02-01 DIAGNOSIS — R21 Rash and other nonspecific skin eruption: Secondary | ICD-10-CM | POA: Diagnosis not present

## 2021-02-01 DIAGNOSIS — K5790 Diverticulosis of intestine, part unspecified, without perforation or abscess without bleeding: Secondary | ICD-10-CM | POA: Diagnosis not present

## 2021-02-01 DIAGNOSIS — Z7289 Other problems related to lifestyle: Secondary | ICD-10-CM | POA: Diagnosis not present

## 2021-02-01 DIAGNOSIS — Z981 Arthrodesis status: Secondary | ICD-10-CM | POA: Diagnosis not present

## 2021-02-01 DIAGNOSIS — I10 Essential (primary) hypertension: Secondary | ICD-10-CM | POA: Diagnosis not present

## 2021-02-03 DIAGNOSIS — I10 Essential (primary) hypertension: Secondary | ICD-10-CM | POA: Diagnosis not present

## 2021-02-03 DIAGNOSIS — G8929 Other chronic pain: Secondary | ICD-10-CM | POA: Diagnosis not present

## 2021-02-03 DIAGNOSIS — E785 Hyperlipidemia, unspecified: Secondary | ICD-10-CM | POA: Diagnosis not present

## 2021-02-03 DIAGNOSIS — Z433 Encounter for attention to colostomy: Secondary | ICD-10-CM | POA: Diagnosis not present

## 2021-02-03 DIAGNOSIS — Z9181 History of falling: Secondary | ICD-10-CM | POA: Diagnosis not present

## 2021-02-03 DIAGNOSIS — K631 Perforation of intestine (nontraumatic): Secondary | ICD-10-CM | POA: Diagnosis not present

## 2021-02-03 DIAGNOSIS — S42251D Displaced fracture of greater tuberosity of right humerus, subsequent encounter for fracture with routine healing: Secondary | ICD-10-CM | POA: Diagnosis not present

## 2021-02-03 DIAGNOSIS — Z7289 Other problems related to lifestyle: Secondary | ICD-10-CM | POA: Diagnosis not present

## 2021-02-03 DIAGNOSIS — R21 Rash and other nonspecific skin eruption: Secondary | ICD-10-CM | POA: Diagnosis not present

## 2021-02-03 DIAGNOSIS — K5790 Diverticulosis of intestine, part unspecified, without perforation or abscess without bleeding: Secondary | ICD-10-CM | POA: Diagnosis not present

## 2021-02-03 DIAGNOSIS — Z48815 Encounter for surgical aftercare following surgery on the digestive system: Secondary | ICD-10-CM | POA: Diagnosis not present

## 2021-02-03 DIAGNOSIS — Z9049 Acquired absence of other specified parts of digestive tract: Secondary | ICD-10-CM | POA: Diagnosis not present

## 2021-02-03 DIAGNOSIS — S42291D Other displaced fracture of upper end of right humerus, subsequent encounter for fracture with routine healing: Secondary | ICD-10-CM | POA: Diagnosis not present

## 2021-02-03 DIAGNOSIS — M1650 Unilateral post-traumatic osteoarthritis, unspecified hip: Secondary | ICD-10-CM | POA: Diagnosis not present

## 2021-02-03 DIAGNOSIS — K219 Gastro-esophageal reflux disease without esophagitis: Secondary | ICD-10-CM | POA: Diagnosis not present

## 2021-02-03 DIAGNOSIS — Z981 Arthrodesis status: Secondary | ICD-10-CM | POA: Diagnosis not present

## 2021-02-03 DIAGNOSIS — Z96653 Presence of artificial knee joint, bilateral: Secondary | ICD-10-CM | POA: Diagnosis not present

## 2021-02-03 DIAGNOSIS — Z79891 Long term (current) use of opiate analgesic: Secondary | ICD-10-CM | POA: Diagnosis not present

## 2021-02-05 DIAGNOSIS — Z9181 History of falling: Secondary | ICD-10-CM | POA: Diagnosis not present

## 2021-02-05 DIAGNOSIS — E785 Hyperlipidemia, unspecified: Secondary | ICD-10-CM | POA: Diagnosis not present

## 2021-02-05 DIAGNOSIS — G8929 Other chronic pain: Secondary | ICD-10-CM | POA: Diagnosis not present

## 2021-02-05 DIAGNOSIS — M1650 Unilateral post-traumatic osteoarthritis, unspecified hip: Secondary | ICD-10-CM | POA: Diagnosis not present

## 2021-02-05 DIAGNOSIS — K219 Gastro-esophageal reflux disease without esophagitis: Secondary | ICD-10-CM | POA: Diagnosis not present

## 2021-02-05 DIAGNOSIS — K631 Perforation of intestine (nontraumatic): Secondary | ICD-10-CM | POA: Diagnosis not present

## 2021-02-05 DIAGNOSIS — Z433 Encounter for attention to colostomy: Secondary | ICD-10-CM | POA: Diagnosis not present

## 2021-02-05 DIAGNOSIS — R21 Rash and other nonspecific skin eruption: Secondary | ICD-10-CM | POA: Diagnosis not present

## 2021-02-05 DIAGNOSIS — Z79891 Long term (current) use of opiate analgesic: Secondary | ICD-10-CM | POA: Diagnosis not present

## 2021-02-05 DIAGNOSIS — K5790 Diverticulosis of intestine, part unspecified, without perforation or abscess without bleeding: Secondary | ICD-10-CM | POA: Diagnosis not present

## 2021-02-05 DIAGNOSIS — Z48815 Encounter for surgical aftercare following surgery on the digestive system: Secondary | ICD-10-CM | POA: Diagnosis not present

## 2021-02-05 DIAGNOSIS — S42251D Displaced fracture of greater tuberosity of right humerus, subsequent encounter for fracture with routine healing: Secondary | ICD-10-CM | POA: Diagnosis not present

## 2021-02-05 DIAGNOSIS — Z9049 Acquired absence of other specified parts of digestive tract: Secondary | ICD-10-CM | POA: Diagnosis not present

## 2021-02-05 DIAGNOSIS — Z981 Arthrodesis status: Secondary | ICD-10-CM | POA: Diagnosis not present

## 2021-02-05 DIAGNOSIS — Z96653 Presence of artificial knee joint, bilateral: Secondary | ICD-10-CM | POA: Diagnosis not present

## 2021-02-05 DIAGNOSIS — I10 Essential (primary) hypertension: Secondary | ICD-10-CM | POA: Diagnosis not present

## 2021-02-05 DIAGNOSIS — Z7289 Other problems related to lifestyle: Secondary | ICD-10-CM | POA: Diagnosis not present

## 2021-02-05 DIAGNOSIS — S42291D Other displaced fracture of upper end of right humerus, subsequent encounter for fracture with routine healing: Secondary | ICD-10-CM | POA: Diagnosis not present

## 2021-02-09 DIAGNOSIS — R21 Rash and other nonspecific skin eruption: Secondary | ICD-10-CM | POA: Diagnosis not present

## 2021-02-09 DIAGNOSIS — Z981 Arthrodesis status: Secondary | ICD-10-CM | POA: Diagnosis not present

## 2021-02-09 DIAGNOSIS — S42291D Other displaced fracture of upper end of right humerus, subsequent encounter for fracture with routine healing: Secondary | ICD-10-CM | POA: Diagnosis not present

## 2021-02-09 DIAGNOSIS — Z433 Encounter for attention to colostomy: Secondary | ICD-10-CM | POA: Diagnosis not present

## 2021-02-09 DIAGNOSIS — I10 Essential (primary) hypertension: Secondary | ICD-10-CM | POA: Diagnosis not present

## 2021-02-09 DIAGNOSIS — K631 Perforation of intestine (nontraumatic): Secondary | ICD-10-CM | POA: Diagnosis not present

## 2021-02-09 DIAGNOSIS — M1650 Unilateral post-traumatic osteoarthritis, unspecified hip: Secondary | ICD-10-CM | POA: Diagnosis not present

## 2021-02-09 DIAGNOSIS — Z7289 Other problems related to lifestyle: Secondary | ICD-10-CM | POA: Diagnosis not present

## 2021-02-09 DIAGNOSIS — E785 Hyperlipidemia, unspecified: Secondary | ICD-10-CM | POA: Diagnosis not present

## 2021-02-09 DIAGNOSIS — Z9049 Acquired absence of other specified parts of digestive tract: Secondary | ICD-10-CM | POA: Diagnosis not present

## 2021-02-09 DIAGNOSIS — S42251D Displaced fracture of greater tuberosity of right humerus, subsequent encounter for fracture with routine healing: Secondary | ICD-10-CM | POA: Diagnosis not present

## 2021-02-09 DIAGNOSIS — Z96653 Presence of artificial knee joint, bilateral: Secondary | ICD-10-CM | POA: Diagnosis not present

## 2021-02-09 DIAGNOSIS — Z9181 History of falling: Secondary | ICD-10-CM | POA: Diagnosis not present

## 2021-02-09 DIAGNOSIS — G8929 Other chronic pain: Secondary | ICD-10-CM | POA: Diagnosis not present

## 2021-02-09 DIAGNOSIS — Z48815 Encounter for surgical aftercare following surgery on the digestive system: Secondary | ICD-10-CM | POA: Diagnosis not present

## 2021-02-09 DIAGNOSIS — Z79891 Long term (current) use of opiate analgesic: Secondary | ICD-10-CM | POA: Diagnosis not present

## 2021-02-09 DIAGNOSIS — K5790 Diverticulosis of intestine, part unspecified, without perforation or abscess without bleeding: Secondary | ICD-10-CM | POA: Diagnosis not present

## 2021-02-09 DIAGNOSIS — K219 Gastro-esophageal reflux disease without esophagitis: Secondary | ICD-10-CM | POA: Diagnosis not present

## 2021-02-11 DIAGNOSIS — Z433 Encounter for attention to colostomy: Secondary | ICD-10-CM | POA: Diagnosis not present

## 2021-02-11 DIAGNOSIS — K5790 Diverticulosis of intestine, part unspecified, without perforation or abscess without bleeding: Secondary | ICD-10-CM | POA: Diagnosis not present

## 2021-02-11 DIAGNOSIS — G8929 Other chronic pain: Secondary | ICD-10-CM | POA: Diagnosis not present

## 2021-02-11 DIAGNOSIS — Z7289 Other problems related to lifestyle: Secondary | ICD-10-CM | POA: Diagnosis not present

## 2021-02-11 DIAGNOSIS — Z9181 History of falling: Secondary | ICD-10-CM | POA: Diagnosis not present

## 2021-02-11 DIAGNOSIS — Z96653 Presence of artificial knee joint, bilateral: Secondary | ICD-10-CM | POA: Diagnosis not present

## 2021-02-11 DIAGNOSIS — E785 Hyperlipidemia, unspecified: Secondary | ICD-10-CM | POA: Diagnosis not present

## 2021-02-11 DIAGNOSIS — M1650 Unilateral post-traumatic osteoarthritis, unspecified hip: Secondary | ICD-10-CM | POA: Diagnosis not present

## 2021-02-11 DIAGNOSIS — S42291D Other displaced fracture of upper end of right humerus, subsequent encounter for fracture with routine healing: Secondary | ICD-10-CM | POA: Diagnosis not present

## 2021-02-11 DIAGNOSIS — K631 Perforation of intestine (nontraumatic): Secondary | ICD-10-CM | POA: Diagnosis not present

## 2021-02-11 DIAGNOSIS — Z9049 Acquired absence of other specified parts of digestive tract: Secondary | ICD-10-CM | POA: Diagnosis not present

## 2021-02-11 DIAGNOSIS — I10 Essential (primary) hypertension: Secondary | ICD-10-CM | POA: Diagnosis not present

## 2021-02-11 DIAGNOSIS — S42251D Displaced fracture of greater tuberosity of right humerus, subsequent encounter for fracture with routine healing: Secondary | ICD-10-CM | POA: Diagnosis not present

## 2021-02-11 DIAGNOSIS — Z79891 Long term (current) use of opiate analgesic: Secondary | ICD-10-CM | POA: Diagnosis not present

## 2021-02-11 DIAGNOSIS — Z981 Arthrodesis status: Secondary | ICD-10-CM | POA: Diagnosis not present

## 2021-02-11 DIAGNOSIS — Z48815 Encounter for surgical aftercare following surgery on the digestive system: Secondary | ICD-10-CM | POA: Diagnosis not present

## 2021-02-11 DIAGNOSIS — K219 Gastro-esophageal reflux disease without esophagitis: Secondary | ICD-10-CM | POA: Diagnosis not present

## 2021-02-11 DIAGNOSIS — R21 Rash and other nonspecific skin eruption: Secondary | ICD-10-CM | POA: Diagnosis not present

## 2021-02-14 ENCOUNTER — Other Ambulatory Visit: Payer: Self-pay

## 2021-02-14 ENCOUNTER — Ambulatory Visit (AMBULATORY_SURGERY_CENTER): Payer: Self-pay | Admitting: *Deleted

## 2021-02-14 ENCOUNTER — Ambulatory Visit: Payer: Self-pay | Admitting: Surgery

## 2021-02-14 VITALS — Ht 63.0 in | Wt 129.0 lb

## 2021-02-14 DIAGNOSIS — Z1211 Encounter for screening for malignant neoplasm of colon: Secondary | ICD-10-CM

## 2021-02-14 DIAGNOSIS — Z933 Colostomy status: Secondary | ICD-10-CM | POA: Diagnosis not present

## 2021-02-14 DIAGNOSIS — K5720 Diverticulitis of large intestine with perforation and abscess without bleeding: Secondary | ICD-10-CM | POA: Diagnosis not present

## 2021-02-14 DIAGNOSIS — K435 Parastomal hernia without obstruction or  gangrene: Secondary | ICD-10-CM | POA: Diagnosis not present

## 2021-02-14 MED ORDER — SUPREP BOWEL PREP KIT 17.5-3.13-1.6 GM/177ML PO SOLN: 1.0000 | Freq: Once | ORAL | 0 refills | Status: AC

## 2021-02-14 NOTE — H&P (Signed)
Chloe Young Appointment: 02/14/2021 11:30 AM Location: [facility] Patient #: [id number] DOB: June 01, 1951 Married / Language: [name] / Race: White Female  History of Present Illness Chloe Hector MD; 02/14/2021 12:23 PM) The patient is a 70 year old female who presents for an evaluation of a hernia. Note for "Hernia": ` ` ` Patient sent for surgical consultation at the request of Dr Chloe Young  Chief Complaint: Perforated diverticulitis status post [facility] resection 11/08/2020. Parastomal hernia. Consideration of colostomy takedown. ` ` The patient is a patient was found to have free air in her cart emergency surgery November 08 2020. 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 She came in and some shock. Was in the hospital for 2 weeks. Had prolonged ileus and IV nutrition but eventually improved. She was left with an open wound that is taken time to close down. Home health stop seeing her so she's been continuing changing the dressing twice a day. Doing little bit of saline on it. Notices minimal yellow on the gauze. She is back to walking her dog a mile and a half a day. She is eating normal food. Drinks a supplemental Ensure shake. Energy level is improving. She did lose about 15 pounds around the time of emergency surgery. She empties a colostomy in the morning. Sometimes feel a time. She does get pain and discomfort below the ostomy. Seems related which she has a hard bowel movement. However does not stop her from walking. She is not on a fiber supplement. He does not smoke. Chloe Young is a smoker. Apparently I operated on him as well. She initially came in with some concern for some ST changes and weakness when she went to the emergency room. She was seen by cardiology. No evidence of MI. Sinus tachycardia related to her peritonitis. Had an echocardiogram 9 years ago that showed good function.  Did not have any post-Hartmann cardiac issues.  (Review of systems as stated in this history (HPI) or in the review of systems. Otherwise all other 12 point ROS are negative) ` ` ###########################################`  This patient encounter took 45 minutes today to perform the following: obtain history, perform exam, review outside records, interpret tests & imaging, counsel the patient on their diagnosis; and, document this encounter, including findings & plan in the electronic health record (EHR).   ##############################################  PREOPERATIVE DIAGNOSIS: Perforated diverticulitis.  POSTOPERATIVE DIAGNOSIS: Perforated diverticulitis.  PROCEDURE PERFORMED: sigmoid colectomy and end colostomy (Hartmann's procedure)  SURGEON: Chloe Klein, MD  ANESTHESIA: General.  FINDINGS: generalized peritonitis  ESTIMATED BLOOD LOSS: 100 mL.  COMPLICATIONS: None known.  PROCEDURE: Patient was identified in the holding area and taken to the operating room where she was placed supine on the operating room table. General anesthesia was induced. Right arm was tucked and foley catheter was placed. The abdomen was prepped and draped in a sterile fashion. Time-out was performed according to the surgical safety check list. When all was correct we continued.  A midline incision was made with a #10 blade and the subcutaneous tissues were divided with a Bovie electrocautery. The fascia was opened in the midline. Purulent fluid was aspirated from the abdomen. The omentum was reflected superiorly. The small bowel was gently pulled up out of the abdomen and pelvis. There were multiple loops with significant inflammatory rind on them, but no small bowel perforation was found. The small bowel was packed into the upper abdomen. The sigmoid was then examined. The distal sigmoid  had a very tiny perforation and a region of inflammation that was short, around 5 cm in length. The proximal  sigmoid was identified and was not inflamed. This was then divided with the GIA 75 mm stapler.   The Balfour retractor was used to facilitate visibility. The Ligasure was used to divide the mesocolon high up near the colon wall going distally. The rectum was divided with the contour stapler distal to the site of inflammation. The abdomen was then irrigated copiously.   Attention was directed to the left abdomen for a location for an ostomy. Kochers were used to pull the fascia to the midline and an another Fransisca Kaufmann was used to elevate the skin to create a circular site. A divot of fatty tissue was taken as well. The fascia was opened in a cruciate fashion separating the longitudinal fibers of the rectus. A lap was placed behind the fascia to ensure that the Bovie could not go through and puncture any of the intra-abdominal organs. A Kary Kos was then advanced through the abdominal wall and used to retract the stump of the descending colon through the abdominal wall. The distal descending colon was freed up just a little to facilitate its reach through the abdominal wall.   The abdomen was then closed using running #1 looped PDS sutures. The wound was then irrigated and packed with moist Kerlix. The colon was then opened with the Bovie. 3-0 Vicryl interrupted sutures were used to create the ostomy. This was then dressed with a 2 piece wafer.  The patient was allowed to emerge from anesthesia and taken to the PACU in stable condition. Needle, sponge, and instrument counts were correct x 2.    Electronically signed by Chloe Klein, MD at 11/08/2020 10:02 PM   #########################################################  SURGICAL PATHOLOGY CASE: [id number] PATIENT: Chloe Young Surgical Pathology Report     Clinical History: bowel obstruction (cm)     FINAL MICROSCOPIC DIAGNOSIS:  A. COLON, SIGMOID, RESECTION: - Segment of colon (9.5 cm) showing diverticulosis and  diverticulitis with associated fibrosis, microabscesses and very small focus of perforation - Margins appear viable     Chloe Young DESCRIPTION:  Specimen: Sigmoid colon, received with both ends stapled. Suture at 1 and designates distal, received fresh Length: 9.5 cm Serosa: Tan-pink with abundant prominent indurated mesocolonic adipose tissue. There is tan-white plaque-like exudate on the soft tissue at the distal end. In this area, there is a pinpoint possible perforation. Contents: None Mucosa/Wall: Wall averages 0.5 cm, the lumen is markedly narrowed without obstruction. Mucosa is tan-pink with prominent redundant mucosal folding. In the area of possible perforation, there are several diverticula present averaging 1 cm. Discrete mucosal lesions not grossly seen. Lymph nodes: None seen Block Summary: 1 = proximal margin en face 2 = distal margin en face 3 = perforation 4-5 = additional diverticula (AK 11/09/2020)     Final Diagnosis performed by Jaquita Folds, MD. Electronically signed 11/10/2020 Technical component performed at [employer]. [facility], [place] [address], [place], [place] [address]. Professional component performed at [facility], [place] [address] [place], [place] [address]. Immunohistochemistry Technical component (if applicable) was performed at [facility]. [address] [place], [place], [place] [address]. IMMUNOHISTOCHEMISTRY DISCLAIMER (if applicable): Some of these immunohistochemical stains may have been developed and the performance characteristics determine by [facility]. Some may not have been cleared or approved by the U.S. Food and Drug Administration. The FDA has determined that such clearance or approval is not  necessary. This test is used for clinical purposes. It should not be regarded as investigational or for research. This laboratory is certified under the [place] (CLIA-88) as qualified to perform high complexity clinical laboratory testing. The controls stained appropriately.   Allergies (Kheana Marshall-McBride, CMA; 02/14/2021 11:43 AM) No Known Allergies [02/14/2021]: No Known Drug Allergies [12/20/2020]: Allergies Reconciled  Medication History (Kheana Marshall-McBride, CMA; 02/14/2021 11:43 AM) oxyCODONE HCl (5MG  Tablet, 1 (one) Oral every six hours, as needed, Taken starting 01/11/2021) Active. Methocarbamol (500MG  Tablet, 1 (one) Oral four times daily, as needed, Taken starting 02/08/2021) Active. Alendronate Sodium (70MG  Tablet, Oral) Active. Fluticasone Propionate (50MCG/ACT Suspension, Nasal) Active. hydroCHLOROthiazide (12.5MG  Capsule, Oral) Active. Metoprolol Tartrate (50MG  Tablet, Oral) Active. Omeprazole (20MG  Capsule DR, Oral) Active. Rosuvastatin Calcium (5MG  Tablet, Oral) Active. Topiramate (100MG  Tablet, Oral) Active. Tylenol (500MG  Capsule, 1 (one) Oral) Active. Lyrica (150MG  Capsule, Oral) Active. Calcium Carbonate (Oral) Specific strength unknown - Active. Multiple Vitamin (1 (one) Oral) Active. Medications Reconciled    Vitals (Kheana Marshall-McBride CMA; 02/14/2021 11:44 AM) 02/14/2021 11:43 AM Weight: 131.13 lb Height: 66in Body Surface Area: 1.67 m Body Mass Index: 21.16 kg/m  Temp.: 97.44F  Pulse: 84 (Regular)  P.OX: 99% (Room air) BP: 118/68(Sitting, Left Arm, Standard)        Physical Exam Chloe Hector MD; 02/14/2021 12:15 PM)  General Mental Status-Alert. General Appearance-Not in acute distress, Not Sickly. Orientation-Oriented X3. Hydration-Well hydrated. Voice-Normal.  Integumentary Global Assessment Upon inspection and palpation of skin surfaces of the - Axillae: non-tender, no inflammation or ulceration, no drainage. and Distribution of scalp and body hair is normal. General Characteristics Temperature -  normal warmth is noted.  Head and Neck Head-normocephalic, atraumatic with no lesions or palpable masses. Face Global Assessment - atraumatic, no absence of expression. Neck Global Assessment - no abnormal movements, no bruit auscultated on the right, no bruit auscultated on the left, no decreased range of motion, non-tender. Trachea-midline. Thyroid Gland Characteristics - non-tender.  Eye Eyeball - Left-Extraocular movements intact, No Nystagmus - Left. Eyeball - Right-Extraocular movements intact, No Nystagmus - Right. Cornea - Left-No Hazy - Left. Cornea - Right-No Hazy - Right. Sclera/Conjunctiva - Left-No scleral icterus, No Discharge - Left. Sclera/Conjunctiva - Right-No scleral icterus, No Discharge - Right. Pupil - Left-Direct reaction to light normal. Pupil - Right-Direct reaction to light normal.  ENMT Ears Pinna - Left - no drainage observed, no generalized tenderness observed. Pinna - Right - no drainage observed, no generalized tenderness observed. Nose and Sinuses External Inspection of the Nose - no destructive lesion observed. Inspection of the nares - Left - quiet respiration. Inspection of the nares - Right - quiet respiration. Mouth and Throat Lips - Upper Lip - no fissures observed, no pallor noted. Lower Lip - no fissures observed, no pallor noted. Nasopharynx - no discharge present. Oral Cavity/Oropharynx - Tongue - no dryness observed. Oral Mucosa - no cyanosis observed. Hypopharynx - no evidence of airway distress observed.  Chest and Lung Exam Inspection Movements - Normal and Symmetrical. Accessory muscles - No use of accessory muscles in breathing. Palpation Palpation of the chest reveals - Non-tender. Auscultation Breath sounds - Normal and Clear.  Cardiovascular Auscultation Rhythm - Regular. Murmurs & Other Heart Sounds - Auscultation of the heart reveals - No Murmurs and No Systolic Clicks.  Abdomen Inspection Inspection  of the abdomen reveals - No Visible peristalsis and No Abnormal pulsations. Umbilicus - No Bleeding, No Urine drainage. Palpation/Percussion Palpation and Percussion  of the abdomen reveal - Soft, Non Tender, No Rebound tenderness, No Rigidity (guarding) and No Cutaneous hyperesthesia. Note: Midline incision with newly epithelialized wound. No exposed granulation tissue. No infection or abscess. No definite hernia although she has a thin stretchy abdominal wall.  Colostomy left side with bulging around it suspicious for parastomal hernia. Mildly sensitive on inferior ridge. Ostomy pink with gas and stool in bag.  Abdomen soft. Nontender. Not distended. No guarding.  Peripheral Vascular Upper Extremity Inspection - Left - No Cyanotic nailbeds - Left, Not Ischemic. Inspection - Right - No Cyanotic nailbeds - Right, Not Ischemic.  Neurologic Neurologic evaluation reveals -normal attention span and ability to concentrate, able to name objects and repeat phrases. Appropriate fund of knowledge , normal sensation and normal coordination. Mental Status Affect - not angry, not paranoid. Cranial Nerves-Normal Bilaterally. Gait-Normal.  Neuropsychiatric Mental status exam performed with findings of-able to articulate well with normal speech/language, rate, volume and coherence, thought content normal with ability to perform basic computations and apply abstract reasoning and no evidence of hallucinations, delusions, obsessions or homicidal/suicidal ideation.  Musculoskeletal Global Assessment Spine, Ribs and Pelvis - no instability, subluxation or laxity. Right Upper Extremity - no instability, subluxation or laxity.  Lymphatic Head & Neck  General Head & Neck Lymphatics: Bilateral - Description - No Localized lymphadenopathy. Axillary  General Axillary Region: Bilateral - Description - No Localized lymphadenopathy. Femoral & Inguinal  Generalized Femoral & Inguinal Lymphatics:  Left - Description - No Localized lymphadenopathy. Right - Description - No Localized lymphadenopathy.    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  I think she has developed a parastomal hernia around the colostomy was not surprising in her emergent state and thin habitus. It is not obstructing. I recommend she do Tylenol scheduled and had a fiber supplement to soften her stool since that seems to be a trigger of discomfort. Can go up to 1 g 4 times a day. She gave her felt that narcotics did much for it so I did not offer a new prescription. Ultimately that type pain will resolve once a colostomy is taken down.  Reasonable to consider colostomy takedown. She is terrified of that will not happen. She has great exercise tolerance and nutrition is better. She's is past the three-month period when I will consider doing it at the soonest. She is very interested in proceeding.  I noted that I cannot do a mesh repair of her parastomal hernia given the risks of infection at the time of colon surgery. She wishes to have a suture repair of her parastomal hernia fascia. Hopefully 70% chance of it being adequate and avoiding hernia recurrence, especially since she is a thin female but does not smoke. She is at risk for hernia recurrence and may require laparoscopic mesh repair in the future. We will see.  She is a reasonable candidate for a robotic approach. Lysis of adhesion with colostomy takedown. Ask Urology to do firefly done at the start of the case to identify and preserve the ureters.  Ideally she gets colonoscopy before surgery. That has been arranged. She is due to colonoscopy March 9 by Dr. Fuller Plan with [facility] gastroenterology. I think it is too short notice to be able to do the ostomy takedown the next day,  but we can see.   COLOSTOMY IN PLACE (Z93.3)  Current  Plans Pt Education - [facility] Ostomy HCI (Jamel Holzmann): discussed with patient and provided information.  PERFORATION OF SIGMOID COLON DUE TO DIVERTICULITIS (K57.20) Impression: Gradually recovering with wound now epithelialized. Reasonable for colostomy takedown since getting past the 3 months. From emergency surgery. 6 months at the latest.  I recommend she had a fiber supplement to help bowel function and minimize future episodes of diverticulitis.  Current Plans Pt Education - [facility] Diverticular Disease (AT)  PREOP COLON - ENCOUNTER FOR PREOPERATIVE EXAMINATION FOR GENERAL SURGICAL PROCEDURE (Z01.818)  Current Plans You are being scheduled for surgery- Our schedulers will call you.  You should hear from our office's scheduling department within 5 working days about the location, date, and time of surgery. We try to make accommodations for patient's preferences in scheduling surgery, but sometimes the OR schedule or the surgeon's schedule prevents [place] from making those accommodations.  If you have not heard from our office [phone]) in 5 working days, call the office and ask for your surgeon's nurse.  If you have other questions about your diagnosis, plan, or surgery, call the office and ask for your surgeon's nurse.  Written instructions provided The anatomy & physiology of the digestive tract was discussed. The pathophysiology was discussed. Possibility of remaining with an ostomy permanently was discussed. I offered ostomy takedown. Laparoscopic & open techniques were discussed.  Risks such as bleeding, infection, abscess, leak, reoperation, possible re-ostomy, injury to other organs, hernia, heart attack, death, and other risks were discussed. I noted a good likelihood this will help address the problem. Goals of post-operative recovery were discussed as well. We will work to minimize complications. Questions were  answered. The patient expresses understanding & wishes to proceed with surgery.  Pt Education - [facility] Colon Bowel Prep 2018 ERAS/Miralax/Antibiotics Started Neomycin Sulfate 500 MG Oral Tablet, 2 (two) Tablet SEE NOTE, #6, 02/14/2021, No Refill. Local Order: Pharmacist Notes: TAKE TWO TABLETS AT 2 PM, 3 PM, AND 10 PM THE DAY PRIOR TO SURGERY Started metroNIDAZOLE 500 MG Oral Tablet, 2 (two) Tablet three times daily, #6, 1 day starting 02/14/2021, No Refill. Local Order: Pharmacist Notes: Pharmacy Instructions: Take 2 tablets at 2pm, 3pm, and 10pm the day prior to your colon operation. Pt Education - Pamphlet Given - Laparoscopic Colorectal Surgery: discussed with patient and provided information. Pt Education - [facility] Colectomy post-op instructions: discussed with patient and provided information.  Chloe Hector, MD, FACS, MASCRS Gastrointestinal and Minimally Invasive Surgery  [facility] [address] [place], [place] [address] [phone] Fax [phone] Main/Paging  CONTACT INFORMATION: Weekday (9AM-5PM) concerns: Call [facility] main office at [phone] Weeknight (5PM-9AM) or Weekend/Holiday concerns: Check [email or web address] for General Surgery [facility] coverage (Please, do not use SecureChat as it is not reliable communication to operating surgeons for immediate patient care)

## 2021-02-14 NOTE — Progress Notes (Signed)
No egg or soy allergy known to patient  No issues with past sedation with any surgeries or procedures No intubation problems in the past  No FH of Malignant Hyperthermia No diet pills per patient No home 02 use per patient  No blood thinners per patient  Pt denies issues with constipation  No A fib or A flutter  EMMI video to pt or via Gerty 19 guidelines implemented in PV today with Pt and RN  Pt is fully vaccinated  for Covid  Pt denies loose or missing teeth, denies dentures, partials, dental implants, capped or bonded teeth   Due to the COVID-19 pandemic we are asking patients to follow certain guidelines.  Pt aware of COVID protocols and [facility] guidelines

## 2021-02-17 DIAGNOSIS — E785 Hyperlipidemia, unspecified: Secondary | ICD-10-CM | POA: Diagnosis not present

## 2021-02-17 DIAGNOSIS — Z433 Encounter for attention to colostomy: Secondary | ICD-10-CM | POA: Diagnosis not present

## 2021-02-17 DIAGNOSIS — G8929 Other chronic pain: Secondary | ICD-10-CM | POA: Diagnosis not present

## 2021-02-17 DIAGNOSIS — R21 Rash and other nonspecific skin eruption: Secondary | ICD-10-CM | POA: Diagnosis not present

## 2021-02-17 DIAGNOSIS — I10 Essential (primary) hypertension: Secondary | ICD-10-CM | POA: Diagnosis not present

## 2021-02-17 DIAGNOSIS — Z7289 Other problems related to lifestyle: Secondary | ICD-10-CM | POA: Diagnosis not present

## 2021-02-17 DIAGNOSIS — Z981 Arthrodesis status: Secondary | ICD-10-CM | POA: Diagnosis not present

## 2021-02-17 DIAGNOSIS — Z48815 Encounter for surgical aftercare following surgery on the digestive system: Secondary | ICD-10-CM | POA: Diagnosis not present

## 2021-02-17 DIAGNOSIS — S42251D Displaced fracture of greater tuberosity of right humerus, subsequent encounter for fracture with routine healing: Secondary | ICD-10-CM | POA: Diagnosis not present

## 2021-02-17 DIAGNOSIS — Z79891 Long term (current) use of opiate analgesic: Secondary | ICD-10-CM | POA: Diagnosis not present

## 2021-02-17 DIAGNOSIS — K219 Gastro-esophageal reflux disease without esophagitis: Secondary | ICD-10-CM | POA: Diagnosis not present

## 2021-02-17 DIAGNOSIS — Z96653 Presence of artificial knee joint, bilateral: Secondary | ICD-10-CM | POA: Diagnosis not present

## 2021-02-17 DIAGNOSIS — K5790 Diverticulosis of intestine, part unspecified, without perforation or abscess without bleeding: Secondary | ICD-10-CM | POA: Diagnosis not present

## 2021-02-17 DIAGNOSIS — Z9181 History of falling: Secondary | ICD-10-CM | POA: Diagnosis not present

## 2021-02-17 DIAGNOSIS — M1650 Unilateral post-traumatic osteoarthritis, unspecified hip: Secondary | ICD-10-CM | POA: Diagnosis not present

## 2021-02-17 DIAGNOSIS — Z9049 Acquired absence of other specified parts of digestive tract: Secondary | ICD-10-CM | POA: Diagnosis not present

## 2021-02-17 DIAGNOSIS — K631 Perforation of intestine (nontraumatic): Secondary | ICD-10-CM | POA: Diagnosis not present

## 2021-02-17 DIAGNOSIS — S31114D Laceration without foreign body of abdominal wall, left lower quadrant without penetration into peritoneal cavity, subsequent encounter: Secondary | ICD-10-CM | POA: Diagnosis not present

## 2021-02-17 DIAGNOSIS — S42291D Other displaced fracture of upper end of right humerus, subsequent encounter for fracture with routine healing: Secondary | ICD-10-CM | POA: Diagnosis not present

## 2021-02-18 ENCOUNTER — Other Ambulatory Visit: Payer: Self-pay | Admitting: Urology

## 2021-02-23 ENCOUNTER — Encounter: Payer: Self-pay | Admitting: Gastroenterology

## 2021-02-25 DIAGNOSIS — K5790 Diverticulosis of intestine, part unspecified, without perforation or abscess without bleeding: Secondary | ICD-10-CM | POA: Diagnosis not present

## 2021-02-25 DIAGNOSIS — Z981 Arthrodesis status: Secondary | ICD-10-CM | POA: Diagnosis not present

## 2021-02-25 DIAGNOSIS — S42291D Other displaced fracture of upper end of right humerus, subsequent encounter for fracture with routine healing: Secondary | ICD-10-CM | POA: Diagnosis not present

## 2021-02-25 DIAGNOSIS — R21 Rash and other nonspecific skin eruption: Secondary | ICD-10-CM | POA: Diagnosis not present

## 2021-02-25 DIAGNOSIS — Z96653 Presence of artificial knee joint, bilateral: Secondary | ICD-10-CM | POA: Diagnosis not present

## 2021-02-25 DIAGNOSIS — Z433 Encounter for attention to colostomy: Secondary | ICD-10-CM | POA: Diagnosis not present

## 2021-02-25 DIAGNOSIS — E785 Hyperlipidemia, unspecified: Secondary | ICD-10-CM | POA: Diagnosis not present

## 2021-02-25 DIAGNOSIS — K631 Perforation of intestine (nontraumatic): Secondary | ICD-10-CM | POA: Diagnosis not present

## 2021-02-25 DIAGNOSIS — M1650 Unilateral post-traumatic osteoarthritis, unspecified hip: Secondary | ICD-10-CM | POA: Diagnosis not present

## 2021-02-25 DIAGNOSIS — Z9049 Acquired absence of other specified parts of digestive tract: Secondary | ICD-10-CM | POA: Diagnosis not present

## 2021-02-25 DIAGNOSIS — Z79891 Long term (current) use of opiate analgesic: Secondary | ICD-10-CM | POA: Diagnosis not present

## 2021-02-25 DIAGNOSIS — K219 Gastro-esophageal reflux disease without esophagitis: Secondary | ICD-10-CM | POA: Diagnosis not present

## 2021-02-25 DIAGNOSIS — Z48815 Encounter for surgical aftercare following surgery on the digestive system: Secondary | ICD-10-CM | POA: Diagnosis not present

## 2021-02-25 DIAGNOSIS — G8929 Other chronic pain: Secondary | ICD-10-CM | POA: Diagnosis not present

## 2021-02-25 DIAGNOSIS — Z7289 Other problems related to lifestyle: Secondary | ICD-10-CM | POA: Diagnosis not present

## 2021-02-25 DIAGNOSIS — I10 Essential (primary) hypertension: Secondary | ICD-10-CM | POA: Diagnosis not present

## 2021-02-25 DIAGNOSIS — Z9181 History of falling: Secondary | ICD-10-CM | POA: Diagnosis not present

## 2021-02-25 DIAGNOSIS — S42251D Displaced fracture of greater tuberosity of right humerus, subsequent encounter for fracture with routine healing: Secondary | ICD-10-CM | POA: Diagnosis not present

## 2021-03-01 DIAGNOSIS — S42251D Displaced fracture of greater tuberosity of right humerus, subsequent encounter for fracture with routine healing: Secondary | ICD-10-CM | POA: Diagnosis not present

## 2021-03-01 DIAGNOSIS — I10 Essential (primary) hypertension: Secondary | ICD-10-CM | POA: Diagnosis not present

## 2021-03-01 DIAGNOSIS — E785 Hyperlipidemia, unspecified: Secondary | ICD-10-CM | POA: Diagnosis not present

## 2021-03-01 DIAGNOSIS — M1650 Unilateral post-traumatic osteoarthritis, unspecified hip: Secondary | ICD-10-CM | POA: Diagnosis not present

## 2021-03-01 DIAGNOSIS — K631 Perforation of intestine (nontraumatic): Secondary | ICD-10-CM | POA: Diagnosis not present

## 2021-03-01 DIAGNOSIS — Z96653 Presence of artificial knee joint, bilateral: Secondary | ICD-10-CM | POA: Diagnosis not present

## 2021-03-01 DIAGNOSIS — K5790 Diverticulosis of intestine, part unspecified, without perforation or abscess without bleeding: Secondary | ICD-10-CM | POA: Diagnosis not present

## 2021-03-01 DIAGNOSIS — G8929 Other chronic pain: Secondary | ICD-10-CM | POA: Diagnosis not present

## 2021-03-01 DIAGNOSIS — Z79891 Long term (current) use of opiate analgesic: Secondary | ICD-10-CM | POA: Diagnosis not present

## 2021-03-01 DIAGNOSIS — Z9049 Acquired absence of other specified parts of digestive tract: Secondary | ICD-10-CM | POA: Diagnosis not present

## 2021-03-01 DIAGNOSIS — Z9181 History of falling: Secondary | ICD-10-CM | POA: Diagnosis not present

## 2021-03-01 DIAGNOSIS — Z7289 Other problems related to lifestyle: Secondary | ICD-10-CM | POA: Diagnosis not present

## 2021-03-01 DIAGNOSIS — K219 Gastro-esophageal reflux disease without esophagitis: Secondary | ICD-10-CM | POA: Diagnosis not present

## 2021-03-01 DIAGNOSIS — Z981 Arthrodesis status: Secondary | ICD-10-CM | POA: Diagnosis not present

## 2021-03-01 DIAGNOSIS — Z48815 Encounter for surgical aftercare following surgery on the digestive system: Secondary | ICD-10-CM | POA: Diagnosis not present

## 2021-03-01 DIAGNOSIS — R21 Rash and other nonspecific skin eruption: Secondary | ICD-10-CM | POA: Diagnosis not present

## 2021-03-01 DIAGNOSIS — S42291D Other displaced fracture of upper end of right humerus, subsequent encounter for fracture with routine healing: Secondary | ICD-10-CM | POA: Diagnosis not present

## 2021-03-01 DIAGNOSIS — Z433 Encounter for attention to colostomy: Secondary | ICD-10-CM | POA: Diagnosis not present

## 2021-03-02 ENCOUNTER — Other Ambulatory Visit: Payer: Self-pay

## 2021-03-02 ENCOUNTER — Encounter: Payer: Self-pay | Admitting: Gastroenterology

## 2021-03-02 ENCOUNTER — Ambulatory Visit (AMBULATORY_SURGERY_CENTER): Payer: Medicare Other | Admitting: Gastroenterology

## 2021-03-02 VITALS — BP 119/65 | HR 52 | Temp 97.3°F | Resp 11 | Ht 63.0 in | Wt 129.0 lb

## 2021-03-02 DIAGNOSIS — Z933 Colostomy status: Secondary | ICD-10-CM

## 2021-03-02 DIAGNOSIS — Z1211 Encounter for screening for malignant neoplasm of colon: Secondary | ICD-10-CM

## 2021-03-02 DIAGNOSIS — Z01818 Encounter for other preprocedural examination: Secondary | ICD-10-CM | POA: Diagnosis not present

## 2021-03-02 MED ORDER — SODIUM CHLORIDE 0.9 % IV SOLN: 500.0000 mL | Freq: Once | INTRAVENOUS | Status: DC

## 2021-03-02 NOTE — Progress Notes (Signed)
Pt's states no medical or surgical changes since previsit or office visit.  VS BY [place]

## 2021-03-02 NOTE — Progress Notes (Signed)
Pt Drowsy. VSS. To PACU, report to RN. No anesthetic complications noted.  

## 2021-03-02 NOTE — Patient Instructions (Signed)
Please read handouts provided. Continue present medications. Follow-up with Dr. Johney Maine as planned.      YOU HAD AN ENDOSCOPIC PROCEDURE TODAY AT [facility]:   Refer to the procedure report that was given to you for any specific questions about what was found during the examination.  If the procedure report does not answer your questions, please call your gastroenterologist to clarify.  If you requested that your care partner not be given the details of your procedure findings, then the procedure report has been included in a sealed envelope for you to review at your convenience later.  YOU SHOULD EXPECT: Some feelings of bloating in the abdomen. Passage of more gas than usual.  Walking can help get rid of the air that was put into your GI tract during the procedure and reduce the bloating. If you had a lower endoscopy (such as a colonoscopy or flexible sigmoidoscopy) you may notice spotting of blood in your stool or on the toilet paper. If you underwent a bowel prep for your procedure, you may not have a normal bowel movement for a few days.  Please Note:  You might notice some irritation and congestion in your nose or some drainage.  This is from the oxygen used during your procedure.  There is no need for concern and it should clear up in a day or so.  SYMPTOMS TO REPORT IMMEDIATELY:   Following lower endoscopy (colonoscopy or flexible sigmoidoscopy):  Excessive amounts of blood in the stool  Significant tenderness or worsening of abdominal pains  Swelling of the abdomen that is new, acute  Fever of 100F or higher       For urgent or emergent issues, a gastroenterologist can be reached at any hour by calling [phone]. Do not use MyChart messaging for urgent concerns.    DIET:  We do recommend a small meal at first, but then you may proceed to your regular diet.  Drink plenty of fluids but you should avoid alcoholic beverages for 24 hours.  ACTIVITY:  You  should plan to take it easy for the rest of today and you should NOT DRIVE or use heavy machinery until tomorrow (because of the sedation medicines used during the test).    FOLLOW UP: Our staff will call the number listed on your records 48-72 hours following your procedure to check on you and address any questions or concerns that you may have regarding the information given to you following your procedure. If we do not reach you, we will leave a message.  We will attempt to reach you two times.  During this call, we will ask if you have developed any symptoms of COVID 19. If you develop any symptoms (ie: fever, flu-like symptoms, shortness of breath, cough etc.) before then, please call [phone].  If you test positive for Covid 19 in the 2 weeks post procedure, please call and report this information to [place].    If any biopsies were taken you will be contacted by phone or by letter within the next 1-3 weeks.  Please call us at [phone] if you have not heard about the biopsies in 3 weeks.    SIGNATURES/CONFIDENTIALITY: You and/or your care partner have signed paperwork which will be entered into your electronic medical record.  These signatures attest to the fact that that the information above on your After Visit Summary has been reviewed and is understood.  Full responsibility of the confidentiality of this discharge information lies with  you and/or your care-partner.

## 2021-03-02 NOTE — Op Note (Addendum)
[place] Patient Name: Chloe Young Procedure Date: 03/02/2021 3:19 PM MRN: [id number] Endoscopist: [name] , MD Age: 70 Referring MD:  Date of Birth: [birth year] Gender: Female Account #: [id number] Procedure:                Colonoscopy Indications:              Screening for colorectal malignant neoplasm.                            Preoperative examination in patient with an end                            colostomy with plans for colostomy take down,                            reanastomosis on March 30. Medicines:                Monitored Anesthesia Care Procedure:                Pre-Anesthesia Assessment:                           - Prior to the procedure, a History and Physical                            was performed, and patient medications and                            allergies were reviewed. The patient's tolerance of                            previous anesthesia was also reviewed. The risks                            and benefits of the procedure and the sedation                            options and risks were discussed with the patient.                            All questions were answered, and informed consent                            was obtained. Prior Anticoagulants: The patient has                            taken no previous anticoagulant or antiplatelet                            agents. ASA Grade Assessment: II - A patient with                            mild systemic disease. After reviewing the risks  and benefits, the patient was deemed in                            satisfactory condition to undergo the procedure.                           After obtaining informed consent, the colonoscope                            was passed under direct vision. Throughout the                            procedure, the patient's blood pressure, pulse, and                            oxygen saturations were monitored continuously.  The                            Olympus PFC-H190DL [id number]) Colonoscope was                            introduced through the descending colostomy and                            advanced to the the cecum, identified by                            appendiceal orifice and ileocecal valve. The scope                            was then advanced through the anus to the sigmoid                            colon, pouch. The ileocecal valve, appendiceal                            orifice, and rectum were photographed. The quality                            of the bowel preparation was good. The colonoscopy                            was performed without difficulty. The patient                            tolerated the procedure well. Scope In: 3:23:43 PM Scope Out: 3:39:44 PM Scope Withdrawal Time: 0 hours 13 minutes 54 seconds  Total Procedure Duration: 0 hours 16 minutes 1 second  Findings:                 The perianal and digital rectal examinations were                            normal.  There was evidence of a patent end colostomy in the                            descending colon. This was characterized by healthy                            appearing mucosa.                           A few small-mouthed diverticula were found in the                            sigmoid colon.                           The exam was otherwise without abnormality on                            direct and retroflexion views. Complications:            No immediate complications. Estimated blood loss:                            None. Estimated Blood Loss:     Estimated blood loss: none. Impression:               - Patent end colostomy with healthy appearing                            mucosa in the descending colon.                           - Mild diverticulosis in the sigmoid colon.                           - The examination was otherwise normal on direct                            and  retroflexion views.                           - No specimens collected. Recommendation:           - Patient has a contact number available for                            emergencies. The signs and symptoms of potential                            delayed complications were discussed with the                            patient. Return to normal activities tomorrow.                            Written discharge instructions were provided to the  patient.                           - Resume previous diet.                           - Continue present medications.                           - No plans for repeat screening colonoscopy due to                            age and the absence of colonic polyps.                           - Follow up with Dr. Johney Maine as planned. [name], MD 03/02/2021 3:47:09 PM This report has been signed electronically.

## 2021-03-04 ENCOUNTER — Telehealth: Payer: Self-pay

## 2021-03-04 NOTE — Telephone Encounter (Signed)
  Follow up Call-  Call back number 03/02/2021  Post procedure Call Back phone  # [phone]  Permission to leave phone message Yes  Some recent data might be hidden     Patient questions:  Do you have a fever, pain , or abdominal swelling? No. Pain Score  0 *  Have you tolerated food without any problems? Yes.    Have you been able to return to your normal activities? Yes.    Do you have any questions about your discharge instructions: Diet   No. Medications  No. Follow up visit  No.  Do you have questions or concerns about your Care? No.  Actions: * If pain score is 4 or above: No action needed, pain <4.   1. Have you developed a fever since your procedure? no  2.   Have you had an respiratory symptoms (SOB or cough) since your procedure? no  3.   Have you tested positive for COVID 19 since your procedure no  4.   Have you had any family members/close contacts diagnosed with the COVID 19 since your procedure?  no   If yes to any of these questions please route to Joylene John, RN and Joella Prince, RN

## 2021-03-05 DIAGNOSIS — S31114D Laceration without foreign body of abdominal wall, left lower quadrant without penetration into peritoneal cavity, subsequent encounter: Secondary | ICD-10-CM | POA: Diagnosis not present

## 2021-03-08 DIAGNOSIS — G8929 Other chronic pain: Secondary | ICD-10-CM | POA: Diagnosis not present

## 2021-03-08 DIAGNOSIS — S42221D 2-part displaced fracture of surgical neck of right humerus, subsequent encounter for fracture with routine healing: Secondary | ICD-10-CM | POA: Diagnosis not present

## 2021-03-08 DIAGNOSIS — S42291D Other displaced fracture of upper end of right humerus, subsequent encounter for fracture with routine healing: Secondary | ICD-10-CM | POA: Diagnosis not present

## 2021-03-08 DIAGNOSIS — K219 Gastro-esophageal reflux disease without esophagitis: Secondary | ICD-10-CM | POA: Diagnosis not present

## 2021-03-08 DIAGNOSIS — E785 Hyperlipidemia, unspecified: Secondary | ICD-10-CM | POA: Diagnosis not present

## 2021-03-08 DIAGNOSIS — S42251D Displaced fracture of greater tuberosity of right humerus, subsequent encounter for fracture with routine healing: Secondary | ICD-10-CM | POA: Diagnosis not present

## 2021-03-08 DIAGNOSIS — R21 Rash and other nonspecific skin eruption: Secondary | ICD-10-CM | POA: Diagnosis not present

## 2021-03-08 DIAGNOSIS — S42201A Unspecified fracture of upper end of right humerus, initial encounter for closed fracture: Secondary | ICD-10-CM

## 2021-03-08 DIAGNOSIS — Z96653 Presence of artificial knee joint, bilateral: Secondary | ICD-10-CM | POA: Diagnosis not present

## 2021-03-08 DIAGNOSIS — Z7289 Other problems related to lifestyle: Secondary | ICD-10-CM | POA: Diagnosis not present

## 2021-03-08 DIAGNOSIS — M1650 Unilateral post-traumatic osteoarthritis, unspecified hip: Secondary | ICD-10-CM | POA: Diagnosis not present

## 2021-03-08 DIAGNOSIS — K631 Perforation of intestine (nontraumatic): Secondary | ICD-10-CM | POA: Diagnosis not present

## 2021-03-08 DIAGNOSIS — Z9181 History of falling: Secondary | ICD-10-CM | POA: Diagnosis not present

## 2021-03-08 DIAGNOSIS — Z981 Arthrodesis status: Secondary | ICD-10-CM | POA: Diagnosis not present

## 2021-03-08 DIAGNOSIS — Z79891 Long term (current) use of opiate analgesic: Secondary | ICD-10-CM | POA: Diagnosis not present

## 2021-03-08 DIAGNOSIS — K5790 Diverticulosis of intestine, part unspecified, without perforation or abscess without bleeding: Secondary | ICD-10-CM | POA: Diagnosis not present

## 2021-03-08 DIAGNOSIS — Z48815 Encounter for surgical aftercare following surgery on the digestive system: Secondary | ICD-10-CM | POA: Diagnosis not present

## 2021-03-08 DIAGNOSIS — I10 Essential (primary) hypertension: Secondary | ICD-10-CM | POA: Diagnosis not present

## 2021-03-08 DIAGNOSIS — Z9049 Acquired absence of other specified parts of digestive tract: Secondary | ICD-10-CM | POA: Diagnosis not present

## 2021-03-08 DIAGNOSIS — Z433 Encounter for attention to colostomy: Secondary | ICD-10-CM | POA: Diagnosis not present

## 2021-03-08 HISTORY — DX: Unspecified fracture of upper end of right humerus, initial encounter for closed fracture: S42.201A

## 2021-03-09 DIAGNOSIS — E785 Hyperlipidemia, unspecified: Secondary | ICD-10-CM | POA: Diagnosis not present

## 2021-03-09 DIAGNOSIS — S42251D Displaced fracture of greater tuberosity of right humerus, subsequent encounter for fracture with routine healing: Secondary | ICD-10-CM | POA: Diagnosis not present

## 2021-03-09 DIAGNOSIS — Z7289 Other problems related to lifestyle: Secondary | ICD-10-CM | POA: Diagnosis not present

## 2021-03-09 DIAGNOSIS — G8929 Other chronic pain: Secondary | ICD-10-CM | POA: Diagnosis not present

## 2021-03-09 DIAGNOSIS — K631 Perforation of intestine (nontraumatic): Secondary | ICD-10-CM | POA: Diagnosis not present

## 2021-03-09 DIAGNOSIS — Z9181 History of falling: Secondary | ICD-10-CM | POA: Diagnosis not present

## 2021-03-09 DIAGNOSIS — S42291D Other displaced fracture of upper end of right humerus, subsequent encounter for fracture with routine healing: Secondary | ICD-10-CM | POA: Diagnosis not present

## 2021-03-09 DIAGNOSIS — Z79891 Long term (current) use of opiate analgesic: Secondary | ICD-10-CM | POA: Diagnosis not present

## 2021-03-09 DIAGNOSIS — Z981 Arthrodesis status: Secondary | ICD-10-CM | POA: Diagnosis not present

## 2021-03-09 DIAGNOSIS — Z433 Encounter for attention to colostomy: Secondary | ICD-10-CM | POA: Diagnosis not present

## 2021-03-09 DIAGNOSIS — M1650 Unilateral post-traumatic osteoarthritis, unspecified hip: Secondary | ICD-10-CM | POA: Diagnosis not present

## 2021-03-09 DIAGNOSIS — K5790 Diverticulosis of intestine, part unspecified, without perforation or abscess without bleeding: Secondary | ICD-10-CM | POA: Diagnosis not present

## 2021-03-09 DIAGNOSIS — K219 Gastro-esophageal reflux disease without esophagitis: Secondary | ICD-10-CM | POA: Diagnosis not present

## 2021-03-09 DIAGNOSIS — Z96653 Presence of artificial knee joint, bilateral: Secondary | ICD-10-CM | POA: Diagnosis not present

## 2021-03-09 DIAGNOSIS — Z9049 Acquired absence of other specified parts of digestive tract: Secondary | ICD-10-CM | POA: Diagnosis not present

## 2021-03-09 DIAGNOSIS — I10 Essential (primary) hypertension: Secondary | ICD-10-CM | POA: Diagnosis not present

## 2021-03-09 DIAGNOSIS — Z48815 Encounter for surgical aftercare following surgery on the digestive system: Secondary | ICD-10-CM | POA: Diagnosis not present

## 2021-03-09 DIAGNOSIS — R21 Rash and other nonspecific skin eruption: Secondary | ICD-10-CM | POA: Diagnosis not present

## 2021-03-16 NOTE — Patient Instructions (Addendum)
DUE TO COVID-19 ONLY ONE VISITOR IS ALLOWED TO COME WITH YOU AND STAY IN THE WAITING ROOM ONLY DURING PRE OP AND PROCEDURE DAY OF SURGERY. THE 1 VISITOR  MAY VISIT WITH YOU AFTER SURGERY IN YOUR PRIVATE ROOM DURING VISITING HOURS ONLY!  YOU NEED TO HAVE A COVID 19 TEST ON_3/26______ @_11 :30______, THIS TEST MUST BE DONE BEFORE SURGERY,  COVID TESTING SITE [place] [id number], IT IS ON THE RIGHT GOING OUT [address] APPROXIMATELY  2 MINUTES PAST [employer] ON THE RIGHT. ONCE YOUR COVID TEST IS COMPLETED,  PLEASE BEGIN THE QUARANTINE INSTRUCTIONS AS OUTLINED IN YOUR HANDOUT.                Chloe Young   Your procedure is scheduled on: 03/23/21   Report to [facility] Main  Entrance   Report to admitting at  10:15 AM     Call this number if you have problems the morning of surgery [phone]    Follow all instructions for bowel prep from Dr. Clyda Greener office.  Drink plenty of fluids on day of prep to prevent dehydration.  Marland Kitchen BRUSH YOUR TEETH MORNING OF SURGERY AND RINSE YOUR MOUTH OUT, NO CHEWING GUM CANDY OR MINTS.   DRINK 2 PRESURGERY ENSURE DRINKS THE NIGHT BEFORE SURGERY AT 10:00 PM    NO SOLIDS AFTER MIDNIGHT THE DAY PRIOR TO THE SURGERY.   NOTHING BY MOUTH EXCEPT CLEAR LIQUIDS UNTIL 9:30 am  Finish last Ensure by 9:30 AM  Lyrica, Topamax, Metoprolol, Omeprazole, use your eye drops.                                                                                                      Take these medicines the morning of surgery with A SIP OF WATER.                                  You may not have any metal on your body including hair pins and              piercings  Do not wear jewelry, make-up, lotions, powders or perfumes, deodorant             Do not wear nail polish on your fingernails.  Do not shave  48 hours prior to surgery.               Do not bring valuables to the hospital. [place].  Contacts, dentures or bridgework may not be worn into surgery.                [facility] - Preparing for Surgery Before surgery, you can play an important role.  Because skin is not sterile, your skin needs to be as free of germs as possible.  You can reduce the number of germs on your skin by washing with CHG (chlorahexidine  gluconate) soap before surgery.  CHG is an antiseptic cleaner which kills germs and bonds with the skin to continue killing germs even after washing. Please DO NOT use if you have an allergy to CHG or antibacterial soaps.  If your skin becomes reddened/irritated stop using the CHG and inform your nurse when you arrive at Short Stay. Do not shave (including legs and underarms) for at least 48 hours prior to the first CHG shower.  Please follow these instructions carefully:  1.  Shower with CHG Soap the night before surgery and the  morning of Surgery.  2.  If you choose to wash your hair, wash your hair first as usual with your  normal  shampoo.  3.  After you shampoo, rinse your hair and body thoroughly to remove the  shampoo.                                        4.  Use CHG as you would any other liquid soap.  You can apply chg directly  to the skin and wash                       Gently with a scrungie or clean washcloth.  5.  Apply the CHG Soap to your body ONLY FROM THE NECK DOWN.   Do not use on face/ open                           Wound or open sores. Avoid contact with eyes, ears mouth and genitals (private parts).                       Wash face,  Genitals (private parts) with your normal soap.             6.  Wash thoroughly, paying special attention to the area where your surgery  will be performed.  7.  Thoroughly rinse your body with warm water from the neck down.  8.  DO NOT shower/wash with your normal soap after using and rinsing off  the CHG Soap.             9.  Pat yourself dry with a clean towel.            10.  Wear clean  pajamas.            11.  Place clean sheets on your bed the night of your first shower and do not  sleep with pets. Day of Surgery : Do not apply any lotions/deodorants the morning of surgery.  Please wear clean clothes to the hospital/surgery center.  FAILURE TO FOLLOW THESE INSTRUCTIONS MAY RESULT IN THE CANCELLATION OF YOUR SURGERY PATIENT SIGNATURE_________________________________  NURSE SIGNATURE__________________________________  ________________________________________________________________________

## 2021-03-17 ENCOUNTER — Encounter (HOSPITAL_COMMUNITY): Payer: Self-pay

## 2021-03-17 ENCOUNTER — Other Ambulatory Visit: Payer: Self-pay

## 2021-03-17 ENCOUNTER — Telehealth: Payer: Self-pay | Admitting: Pharmacist

## 2021-03-17 ENCOUNTER — Encounter (HOSPITAL_COMMUNITY)
Admission: RE | Admit: 2021-03-17 | Discharge: 2021-03-17 | Disposition: A | Discharge status: Home / Self Care | Payer: Medicare Other | Source: Ambulatory Visit | Attending: Surgery | Admitting: Surgery

## 2021-03-17 DIAGNOSIS — Z01812 Encounter for preprocedural laboratory examination: Secondary | ICD-10-CM | POA: Insufficient documentation

## 2021-03-17 HISTORY — DX: Headache, unspecified: R51.9

## 2021-03-17 LAB — COMPREHENSIVE METABOLIC PANEL
ALT: 30 U/L (ref 0–44)
AST: 30 U/L (ref 15–41)
Albumin: 4.2 g/dL (ref 3.5–5.0)
Alkaline Phosphatase: 50 U/L (ref 38–126)
Anion gap: 9 (ref 5–15)
BUN: 20 mg/dL (ref 8–23)
CO2: 22 mmol/L (ref 22–32)
Calcium: 9.2 mg/dL (ref 8.9–10.3)
Chloride: 102 mmol/L (ref 98–111)
Creatinine, Ser: 0.53 mg/dL (ref 0.44–1.00)
GFR, Estimated: >60 mL/min (ref >60)
Glucose, Bld: 98 mg/dL (ref 70–99)
Potassium: 3.8 mmol/L (ref 3.5–5.1)
Sodium: 133 mmol/L — ABNORMAL LOW (ref 135–145)
Total Bilirubin: 0.6 mg/dL (ref 0.3–1.2)
Total Protein: 7.3 g/dL (ref 6.5–8.1)

## 2021-03-17 LAB — CBC
HCT: 36.5 % (ref 36.0–46.0)
Hemoglobin: 11.9 g/dL — ABNORMAL LOW (ref 12.0–15.0)
MCH: 28.1 pg (ref 26.0–34.0)
MCHC: 32.6 g/dL (ref 30.0–36.0)
MCV: 86.3 fL (ref 80.0–100.0)
Platelets: 210 10*3/uL (ref 150–400)
RBC: 4.23 MIL/uL (ref 3.87–5.11)
RDW: 15.3 % (ref 11.5–15.5)
WBC: 4.9 10*3/uL (ref 4.0–10.5)
nRBC: 0 % (ref 0.0–0.2)

## 2021-03-17 LAB — HEMOGLOBIN A1C
Hgb A1c MFr Bld: 5.8 % — ABNORMAL HIGH (ref 4.8–5.6)
Mean Plasma Glucose: 119.76 mg/dL

## 2021-03-17 NOTE — Progress Notes (Unsigned)
° ° °  Chronic Care Management Pharmacy Assistant   Name: Chloe Young  MRN: [id number] DOB: 12/09/1951    Reason for Encounter: Disease State -Adherence  Recent office visits:  None noted  Recent consult visits:  03/02/2021: Lucio Edward, MD / Gastroenterology  Hospital visits:  Medication Reconciliation was completed by comparing discharge summary, patients EMR and Pharmacy list, and upon discussion with patient.  Admitted to the hospital on 11/08/2020 due to perforated abdominal viscus.Marland Kitchen Discharge date was 11/23/2020. Discharged from [facility]?Medications Started at [facility] Discharge:?? -started Augmentin x14 days due to leukocytosis  Medication Changes at Hospital Discharge: None noted  Medications Discontinued at Hospital Discharge: None noted  Medications that remain the same after Hospital Discharge:??  -All other medications will remain the same.    Medications: Outpatient Encounter Medications as of 03/17/2021  Medication Sig   acetaminophen (TYLENOL) 500 MG tablet Take 1 tablet (500 mg total) by mouth every 6 (six) hours as needed for moderate pain.   alendronate (FOSAMAX) 70 MG tablet TAKE 1 TABLET BY MOUTH  EVERY 7 DAYS TAKE WITH A  FULL GLASS OF WATER ON AN  EMPTY STOMACH (Patient taking differently: Take 70 mg by mouth every Thursday. TAKE 1 TABLET BY MOUTH  EVERY 7 DAYS TAKE WITH A  FULL GLASS OF WATER ON AN  EMPTY STOMACH)   Calcium Carbonate-Vitamin D (CALCIUM + D PO) Take 1 tablet by mouth daily.   carboxymethylcellul-glycerin (LUBRICANT DROPS/DUAL-ACTION) 0.5-0.9 % ophthalmic solution Place 1 drop into both eyes 3 (three) times daily as needed for dry eyes.   fluticasone (FLONASE) 50 MCG/ACT nasal spray USE 1 SPRAY IN BOTH  NOSTRILS TWICE DAILY (Patient taking differently: Place 1 spray into both nostrils 2 (two) times daily as needed for allergies.)   hydrochlorothiazide (MICROZIDE) 12.5 MG capsule TAKE 1 CAPSULE BY MOUTH  DAILY  (Patient taking differently: Take 12.5 mg by mouth in the morning.)   LYRICA 150 MG capsule Take 150 mg by mouth 2 (two) times daily.    metoprolol tartrate (LOPRESSOR) 50 MG tablet TAKE ONE-HALF TABLET BY  MOUTH TWICE DAILY (Patient taking differently: Take 25 mg by mouth 2 (two) times daily.)   Multiple Vitamin (MULITIVITAMIN WITH MINERALS) TABS Take 1 tablet by mouth in the morning.   omeprazole (PRILOSEC) 20 MG capsule TAKE 1 CAPSULE BY MOUTH  DAILY (Patient taking differently: Take 20 mg by mouth in the morning.)   oxyCODONE (OXY IR/ROXICODONE) 5 MG immediate release tablet Take 1 tablet (5 mg total) by mouth every 6 (six) hours as needed for severe pain.   rosuvastatin (CRESTOR) 5 MG tablet TAKE 1 TABLET BY MOUTH  DAILY (Patient taking differently: Take 5 mg by mouth in the morning.)   topiramate (TOPAMAX) 100 MG tablet Take 100 mg by mouth 2 (two) times daily.   No facility-administered encounter medications on file as of 03/17/2021.   Have you had any problems recently with your health?  Have you had any problems with your pharmacy?  What issues or side effects are you having with your medications?  What would you like me to pass along to [facility] for them to help you with?   What can we do to take care of you better?    Star Rating Drugs: Rosuvastatin 5 mg. Tablet:  Take 1 tablet by mouth daily. Last filled 02/21/2021 90DS  Georgiana Shore ,[place] Pharmacist Assistant [phone]

## 2021-03-17 NOTE — Progress Notes (Signed)
COVID Vaccine Completed:Yes Date COVID Vaccine completed:03/09/20-booster 10/23/20 COVID vaccine manufacturer: Pfizer     PCP - Dr. Jenness [place] Cardiologist - Dr. Estevan Ryder  Chest x-ray - no EKG - 11/09/20-epic Stress Test -2013-epic  ECHO -2013  Cardiac Cath - No Pacemaker/ICD device last checked:NA  Sleep Study - no CPAP -   Fasting Blood Sugar - NA Checks Blood Sugar _____ times a day  Blood Thinner Instructions:NA Aspirin Instructions: Last Dose:  Anesthesia review:   Patient denies shortness of breath, fever, cough and chest pain at PAT appointment  yes Patient verbalized understanding of instructions that were given to them at the PAT appointment. Patient was also instructed that they will need to review over the PAT instructions again at home before surgery.yes Pt walks every day and reports no SOB with any activities. She had a neck fusion and some limited ROM moving her head side to side but no problems with intubation.

## 2021-03-18 DIAGNOSIS — Z48815 Encounter for surgical aftercare following surgery on the digestive system: Secondary | ICD-10-CM | POA: Diagnosis not present

## 2021-03-18 DIAGNOSIS — Z79891 Long term (current) use of opiate analgesic: Secondary | ICD-10-CM | POA: Diagnosis not present

## 2021-03-18 DIAGNOSIS — K219 Gastro-esophageal reflux disease without esophagitis: Secondary | ICD-10-CM | POA: Diagnosis not present

## 2021-03-18 DIAGNOSIS — S42251D Displaced fracture of greater tuberosity of right humerus, subsequent encounter for fracture with routine healing: Secondary | ICD-10-CM | POA: Diagnosis not present

## 2021-03-18 DIAGNOSIS — I10 Essential (primary) hypertension: Secondary | ICD-10-CM | POA: Diagnosis not present

## 2021-03-18 DIAGNOSIS — Z9181 History of falling: Secondary | ICD-10-CM | POA: Diagnosis not present

## 2021-03-18 DIAGNOSIS — Z433 Encounter for attention to colostomy: Secondary | ICD-10-CM | POA: Diagnosis not present

## 2021-03-18 DIAGNOSIS — E785 Hyperlipidemia, unspecified: Secondary | ICD-10-CM | POA: Diagnosis not present

## 2021-03-18 DIAGNOSIS — S42291D Other displaced fracture of upper end of right humerus, subsequent encounter for fracture with routine healing: Secondary | ICD-10-CM | POA: Diagnosis not present

## 2021-03-18 DIAGNOSIS — G8929 Other chronic pain: Secondary | ICD-10-CM | POA: Diagnosis not present

## 2021-03-18 DIAGNOSIS — M1650 Unilateral post-traumatic osteoarthritis, unspecified hip: Secondary | ICD-10-CM | POA: Diagnosis not present

## 2021-03-18 DIAGNOSIS — Z981 Arthrodesis status: Secondary | ICD-10-CM | POA: Diagnosis not present

## 2021-03-18 DIAGNOSIS — K631 Perforation of intestine (nontraumatic): Secondary | ICD-10-CM | POA: Diagnosis not present

## 2021-03-18 DIAGNOSIS — K5790 Diverticulosis of intestine, part unspecified, without perforation or abscess without bleeding: Secondary | ICD-10-CM | POA: Diagnosis not present

## 2021-03-18 DIAGNOSIS — Z96653 Presence of artificial knee joint, bilateral: Secondary | ICD-10-CM | POA: Diagnosis not present

## 2021-03-18 DIAGNOSIS — Z9049 Acquired absence of other specified parts of digestive tract: Secondary | ICD-10-CM | POA: Diagnosis not present

## 2021-03-18 DIAGNOSIS — R21 Rash and other nonspecific skin eruption: Secondary | ICD-10-CM | POA: Diagnosis not present

## 2021-03-18 DIAGNOSIS — Z7289 Other problems related to lifestyle: Secondary | ICD-10-CM | POA: Diagnosis not present

## 2021-03-19 ENCOUNTER — Other Ambulatory Visit (HOSPITAL_COMMUNITY)
Admission: RE | Admit: 2021-03-19 | Discharge: 2021-03-19 | Disposition: A | Discharge status: Home / Self Care | Payer: Medicare Other | Source: Ambulatory Visit | Attending: Surgery | Admitting: Surgery

## 2021-03-19 DIAGNOSIS — Z20822 Contact with and (suspected) exposure to covid-19: Secondary | ICD-10-CM | POA: Insufficient documentation

## 2021-03-19 DIAGNOSIS — Z01812 Encounter for preprocedural laboratory examination: Secondary | ICD-10-CM | POA: Insufficient documentation

## 2021-03-19 LAB — SARS CORONAVIRUS 2 (TAT 6-24 HRS): SARS Coronavirus 2: NEGATIVE

## 2021-03-22 MED ORDER — BUPIVACAINE LIPOSOME 1.3 % IJ SUSP
20.0000 mL | Freq: Once | INTRAMUSCULAR | Status: DC
  Filled 2021-03-22: qty 20

## 2021-03-22 NOTE — Anesthesia Preprocedure Evaluation (Addendum)
Anesthesia Evaluation  Patient identified by MRN, date of birth, ID band Patient awake    Reviewed: Allergy & Precautions, NPO status , Patient's Chart, lab work & pertinent test results  Airway Mallampati: II  TM Distance: >3 FB Neck ROM: Full    Dental no notable dental hx. (+) Teeth Intact, Dental Advisory Given   Pulmonary neg pulmonary ROS,    Pulmonary exam normal breath sounds clear to auscultation       Cardiovascular hypertension, Pt. on medications and Pt. on home beta blockers Normal cardiovascular exam Rhythm:Regular Rate:Normal  10/2020 EKG ST w qs II, III, AVF   Neuro/Psych  Headaches, negative psych ROS   GI/Hepatic Neg liver ROS, Per diverticula 02/14/21   Endo/Other  negative endocrine ROS  Renal/GU K+ 3.8 Cr 0.53  negative genitourinary   Musculoskeletal  (+) Arthritis ,   Abdominal   Peds  Hematology Hgb 11.9 Plt 210   Anesthesia Other Findings   Reproductive/Obstetrics                            Anesthesia Physical Anesthesia Plan  ASA: II  Anesthesia Plan: General   Post-op Pain Management:    Induction: Intravenous  PONV Risk Score and Plan: Treatment may vary due to age or medical condition, Midazolam, Dexamethasone and Ondansetron  Airway Management Planned: Oral ETT  Additional Equipment: None  Intra-op Plan:   Post-operative Plan: Extubation in OR  Informed Consent: I have reviewed the patients History and Physical, chart, labs and discussed the procedure including the risks, benefits and alternatives for the proposed anesthesia with the patient or authorized representative who has indicated his/her understanding and acceptance.     Dental advisory given  Plan Discussed with: CRNA and Anesthesiologist  Anesthesia Plan Comments: (GA w Lidocaine infusion +/- ketamine 0.3 mg/kg)       Anesthesia Quick Evaluation

## 2021-03-23 ENCOUNTER — Inpatient Hospital Stay (HOSPITAL_COMMUNITY): Payer: Medicare Other | Admitting: Certified Registered Nurse Anesthetist

## 2021-03-23 ENCOUNTER — Other Ambulatory Visit: Payer: Self-pay

## 2021-03-23 ENCOUNTER — Inpatient Hospital Stay (HOSPITAL_COMMUNITY)
Admission: RE | Admit: 2021-03-23 | Discharge: 2021-03-25 | DRG: 337 | Disposition: A | Discharge status: Home / Self Care | Payer: Medicare Other | Source: Ambulatory Visit | Attending: Surgery | Admitting: Surgery

## 2021-03-23 ENCOUNTER — Encounter (HOSPITAL_COMMUNITY)
Admission: RE | Disposition: A | Discharge status: Home / Self Care | Payer: Self-pay | Source: Ambulatory Visit | Attending: Surgery

## 2021-03-23 ENCOUNTER — Encounter (HOSPITAL_COMMUNITY): Payer: Self-pay | Admitting: Surgery

## 2021-03-23 DIAGNOSIS — E785 Hyperlipidemia, unspecified: Secondary | ICD-10-CM | POA: Diagnosis present

## 2021-03-23 DIAGNOSIS — K432 Incisional hernia without obstruction or gangrene: Secondary | ICD-10-CM | POA: Diagnosis not present

## 2021-03-23 DIAGNOSIS — Z7722 Contact with and (suspected) exposure to environmental tobacco smoke (acute) (chronic): Secondary | ICD-10-CM | POA: Diagnosis not present

## 2021-03-23 DIAGNOSIS — K435 Parastomal hernia without obstruction or  gangrene: Secondary | ICD-10-CM

## 2021-03-23 DIAGNOSIS — I10 Essential (primary) hypertension: Secondary | ICD-10-CM | POA: Diagnosis present

## 2021-03-23 DIAGNOSIS — Z96653 Presence of artificial knee joint, bilateral: Secondary | ICD-10-CM | POA: Diagnosis present

## 2021-03-23 DIAGNOSIS — I7300 Raynaud's syndrome without gangrene: Secondary | ICD-10-CM | POA: Diagnosis not present

## 2021-03-23 DIAGNOSIS — K6389 Other specified diseases of intestine: Secondary | ICD-10-CM | POA: Diagnosis not present

## 2021-03-23 DIAGNOSIS — Z79899 Other long term (current) drug therapy: Secondary | ICD-10-CM

## 2021-03-23 DIAGNOSIS — M961 Postlaminectomy syndrome, not elsewhere classified: Secondary | ICD-10-CM | POA: Diagnosis present

## 2021-03-23 DIAGNOSIS — Z408 Encounter for other prophylactic surgery: Secondary | ICD-10-CM | POA: Diagnosis not present

## 2021-03-23 DIAGNOSIS — M8580 Other specified disorders of bone density and structure, unspecified site: Secondary | ICD-10-CM | POA: Diagnosis not present

## 2021-03-23 DIAGNOSIS — Z7983 Long term (current) use of bisphosphonates: Secondary | ICD-10-CM

## 2021-03-23 DIAGNOSIS — G894 Chronic pain syndrome: Secondary | ICD-10-CM | POA: Diagnosis present

## 2021-03-23 DIAGNOSIS — K660 Peritoneal adhesions (postprocedural) (postinfection): Secondary | ICD-10-CM | POA: Diagnosis not present

## 2021-03-23 DIAGNOSIS — K219 Gastro-esophageal reflux disease without esophagitis: Secondary | ICD-10-CM | POA: Diagnosis present

## 2021-03-23 DIAGNOSIS — K5792 Diverticulitis of intestine, part unspecified, without perforation or abscess without bleeding: Secondary | ICD-10-CM | POA: Diagnosis not present

## 2021-03-23 DIAGNOSIS — Z433 Encounter for attention to colostomy: Secondary | ICD-10-CM | POA: Diagnosis not present

## 2021-03-23 DIAGNOSIS — E876 Hypokalemia: Secondary | ICD-10-CM

## 2021-03-23 DIAGNOSIS — M5416 Radiculopathy, lumbar region: Secondary | ICD-10-CM | POA: Diagnosis not present

## 2021-03-23 DIAGNOSIS — Z981 Arthrodesis status: Secondary | ICD-10-CM

## 2021-03-23 DIAGNOSIS — K5720 Diverticulitis of large intestine with perforation and abscess without bleeding: Secondary | ICD-10-CM | POA: Diagnosis present

## 2021-03-23 DIAGNOSIS — K630 Abscess of intestine: Secondary | ICD-10-CM | POA: Diagnosis not present

## 2021-03-23 DIAGNOSIS — M542 Cervicalgia: Secondary | ICD-10-CM | POA: Diagnosis present

## 2021-03-23 DIAGNOSIS — K5732 Diverticulitis of large intestine without perforation or abscess without bleeding: Secondary | ICD-10-CM | POA: Diagnosis present

## 2021-03-23 DIAGNOSIS — K633 Ulcer of intestine: Secondary | ICD-10-CM | POA: Diagnosis not present

## 2021-03-23 HISTORY — PX: CYSTOSCOPY: SHX5120

## 2021-03-23 HISTORY — DX: Other specified symptoms and signs involving the digestive system and abdomen: R19.8

## 2021-03-23 SURGERY — COLECTOMY, PARTIAL, ROBOT-ASSISTED, LAPAROSCOPIC
Anesthesia: General

## 2021-03-23 MED ORDER — SODIUM CHLORIDE 0.9 % IV SOLN: Freq: Three times a day (TID) | INTRAVENOUS | Status: DC | PRN

## 2021-03-23 MED ORDER — HYDROMORPHONE HCL 1 MG/ML IJ SOLN: 0.2500 mg | INTRAMUSCULAR | Status: DC | PRN

## 2021-03-23 MED ORDER — BUPIVACAINE LIPOSOME 1.3 % IJ SUSP
INTRAMUSCULAR | Status: DC | PRN
  Administered 2021-03-23: 20 mL

## 2021-03-23 MED ORDER — SODIUM CHLORIDE 0.9 % IR SOLN
Status: DC | PRN
Start: 2021-03-23 — End: 2021-03-23
  Administered 2021-03-23: 1000 mL

## 2021-03-23 MED ORDER — LIDOCAINE 20MG/ML (2%) 15 ML SYRINGE OPTIME
INTRAMUSCULAR | Status: DC | PRN
  Administered 2021-03-23: 1.5 mg/kg/h via INTRAVENOUS

## 2021-03-23 MED ORDER — ENOXAPARIN SODIUM 40 MG/0.4ML ~~LOC~~ SOLN
40.0000 mg | SUBCUTANEOUS | Status: DC
  Administered 2021-03-24 – 2021-03-25 (×2): 40 mg via SUBCUTANEOUS
  Filled 2021-03-23 (×2): qty 0.4

## 2021-03-23 MED ORDER — ROCURONIUM BROMIDE 10 MG/ML (PF) SYRINGE
PREFILLED_SYRINGE | INTRAVENOUS | Status: DC | PRN
  Administered 2021-03-23: 20 mg via INTRAVENOUS
  Administered 2021-03-23 (×2): 30 mg via INTRAVENOUS
  Administered 2021-03-23: 40 mg via INTRAVENOUS

## 2021-03-23 MED ORDER — DIPHENHYDRAMINE HCL 50 MG/ML IJ SOLN: 12.5000 mg | Freq: Four times a day (QID) | INTRAMUSCULAR | Status: DC | PRN

## 2021-03-23 MED ORDER — STERILE WATER FOR INJECTION IJ SOLN
INTRAMUSCULAR | Status: AC
  Filled 2021-03-23: qty 10

## 2021-03-23 MED ORDER — PHENYLEPHRINE 40 MCG/ML (10ML) SYRINGE FOR IV PUSH (FOR BLOOD PRESSURE SUPPORT)
PREFILLED_SYRINGE | INTRAVENOUS | Status: AC
  Filled 2021-03-23: qty 10

## 2021-03-23 MED ORDER — CARBOXYMETHYLCELLUL-GLYCERIN 0.5-0.9 % OP SOLN: 1.0000 [drp] | Freq: Three times a day (TID) | OPHTHALMIC | Status: DC | PRN

## 2021-03-23 MED ORDER — ENSURE PRE-SURGERY PO LIQD
296.0000 mL | Freq: Once | ORAL | Status: DC
  Filled 2021-03-23: qty 296

## 2021-03-23 MED ORDER — ROSUVASTATIN CALCIUM 5 MG PO TABS
5.0000 mg | ORAL_TABLET | Freq: Every day | ORAL | Status: DC
  Administered 2021-03-24 – 2021-03-25 (×2): 5 mg via ORAL
  Filled 2021-03-23 (×2): qty 1

## 2021-03-23 MED ORDER — SODIUM CHLORIDE 0.9 % IV SOLN
2.0000 g | INTRAVENOUS | Status: AC
  Administered 2021-03-23: 2 g via INTRAVENOUS
  Filled 2021-03-23: qty 2

## 2021-03-23 MED ORDER — SODIUM CHLORIDE 0.9% FLUSH: 3.0000 mL | INTRAVENOUS | Status: DC | PRN

## 2021-03-23 MED ORDER — ALVIMOPAN 12 MG PO CAPS
12.0000 mg | ORAL_CAPSULE | Freq: Two times a day (BID) | ORAL | Status: DC
  Administered 2021-03-24 (×2): 12 mg via ORAL
  Filled 2021-03-23 (×2): qty 1

## 2021-03-23 MED ORDER — LACTATED RINGERS IV SOLN: INTRAVENOUS | Status: DC

## 2021-03-23 MED ORDER — ONDANSETRON HCL 4 MG/2ML IJ SOLN: 4.0000 mg | Freq: Once | INTRAMUSCULAR | Status: DC | PRN

## 2021-03-23 MED ORDER — NEOMYCIN SULFATE 500 MG PO TABS: 1000.0000 mg | ORAL_TABLET | ORAL | Status: DC

## 2021-03-23 MED ORDER — FLUTICASONE PROPIONATE 50 MCG/ACT NA SUSP: 1.0000 | Freq: Two times a day (BID) | NASAL | Status: DC | PRN

## 2021-03-23 MED ORDER — MIDAZOLAM HCL 2 MG/2ML IJ SOLN
INTRAMUSCULAR | Status: AC
  Filled 2021-03-23: qty 2

## 2021-03-23 MED ORDER — METOPROLOL TARTRATE 25 MG PO TABS
25.0000 mg | ORAL_TABLET | Freq: Two times a day (BID) | ORAL | Status: DC
  Administered 2021-03-23 – 2021-03-25 (×4): 25 mg via ORAL
  Filled 2021-03-23 (×4): qty 1

## 2021-03-23 MED ORDER — METOPROLOL TARTRATE 5 MG/5ML IV SOLN
5.0000 mg | Freq: Four times a day (QID) | INTRAVENOUS | Status: DC | PRN
  Filled 2021-03-23: qty 5

## 2021-03-23 MED ORDER — PROCHLORPERAZINE MALEATE 10 MG PO TABS
10.0000 mg | ORAL_TABLET | Freq: Four times a day (QID) | ORAL | Status: DC | PRN
Start: 2021-03-23 — End: 2021-03-25
  Filled 2021-03-23: qty 1

## 2021-03-23 MED ORDER — STERILE WATER FOR INJECTION IJ SOLN
INTRAMUSCULAR | Status: DC | PRN
  Administered 2021-03-23: 15 mL via URETERAL

## 2021-03-23 MED ORDER — ATROPINE SULFATE 0.4 MG/ML IV SOSY
PREFILLED_SYRINGE | INTRAVENOUS | Status: DC | PRN
  Administered 2021-03-23: .4 mg via INTRAVENOUS
  Administered 2021-03-23: .8 mg via INTRAVENOUS

## 2021-03-23 MED ORDER — DEXAMETHASONE SODIUM PHOSPHATE 10 MG/ML IJ SOLN
INTRAMUSCULAR | Status: AC
  Filled 2021-03-23: qty 1

## 2021-03-23 MED ORDER — METRONIDAZOLE 500 MG PO TABS: 1000.0000 mg | ORAL_TABLET | ORAL | Status: DC

## 2021-03-23 MED ORDER — METHOCARBAMOL 500 MG PO TABS
1000.0000 mg | ORAL_TABLET | Freq: Four times a day (QID) | ORAL | Status: DC | PRN
  Administered 2021-03-24 – 2021-03-25 (×2): 1000 mg via ORAL
  Filled 2021-03-23 (×5): qty 2

## 2021-03-23 MED ORDER — SUGAMMADEX SODIUM 200 MG/2ML IV SOLN
INTRAVENOUS | Status: DC | PRN
  Administered 2021-03-23: 200 mg via INTRAVENOUS

## 2021-03-23 MED ORDER — EPINEPHRINE PF 1 MG/ML IJ SOLN
INTRAMUSCULAR | Status: AC
  Filled 2021-03-23: qty 1

## 2021-03-23 MED ORDER — ENOXAPARIN SODIUM 40 MG/0.4ML ~~LOC~~ SOLN
40.0000 mg | Freq: Once | SUBCUTANEOUS | Status: AC
  Administered 2021-03-23: 40 mg via SUBCUTANEOUS
  Filled 2021-03-23: qty 0.4

## 2021-03-23 MED ORDER — LIDOCAINE 2% (20 MG/ML) 5 ML SYRINGE
INTRAMUSCULAR | Status: AC
  Filled 2021-03-23: qty 5

## 2021-03-23 MED ORDER — PANTOPRAZOLE SODIUM 40 MG PO TBEC
40.0000 mg | DELAYED_RELEASE_TABLET | Freq: Every day | ORAL | Status: DC
  Administered 2021-03-24 – 2021-03-25 (×2): 40 mg via ORAL
  Filled 2021-03-23 (×3): qty 1

## 2021-03-23 MED ORDER — ADULT MULTIVITAMIN W/MINERALS CH
1.0000 | ORAL_TABLET | Freq: Every morning | ORAL | Status: DC
  Administered 2021-03-24 – 2021-03-25 (×2): 1 via ORAL
  Filled 2021-03-23 (×2): qty 1

## 2021-03-23 MED ORDER — KETAMINE HCL 10 MG/ML IJ SOLN
INTRAMUSCULAR | Status: AC
  Filled 2021-03-23: qty 1

## 2021-03-23 MED ORDER — TOPIRAMATE 100 MG PO TABS
100.0000 mg | ORAL_TABLET | Freq: Two times a day (BID) | ORAL | Status: DC
  Administered 2021-03-23 – 2021-03-25 (×4): 100 mg via ORAL
  Filled 2021-03-23 (×4): qty 1

## 2021-03-23 MED ORDER — ONDANSETRON HCL 4 MG PO TABS
4.0000 mg | ORAL_TABLET | Freq: Four times a day (QID) | ORAL | Status: DC | PRN
  Administered 2021-03-25: 4 mg via ORAL
  Filled 2021-03-23: qty 1

## 2021-03-23 MED ORDER — CHLORHEXIDINE GLUCONATE 0.12 % MT SOLN
15.0000 mL | Freq: Once | OROMUCOSAL | Status: AC
  Administered 2021-03-23: 15 mL via OROMUCOSAL

## 2021-03-23 MED ORDER — CHLORHEXIDINE GLUCONATE CLOTH 2 % EX PADS: 6.0000 | MEDICATED_PAD | Freq: Once | CUTANEOUS | Status: DC

## 2021-03-23 MED ORDER — SODIUM CHLORIDE 0.9 % IV SOLN: 250.0000 mL | INTRAVENOUS | Status: DC | PRN

## 2021-03-23 MED ORDER — SIMETHICONE 80 MG PO CHEW
40.0000 mg | CHEWABLE_TABLET | Freq: Four times a day (QID) | ORAL | Status: DC | PRN
  Administered 2021-03-24 – 2021-03-25 (×2): 40 mg via ORAL
  Filled 2021-03-23 (×2): qty 1

## 2021-03-23 MED ORDER — SODIUM CHLORIDE 0.9 % IV SOLN
2.0000 g | Freq: Two times a day (BID) | INTRAVENOUS | Status: AC
  Administered 2021-03-23: 2 g via INTRAVENOUS
  Filled 2021-03-23: qty 2

## 2021-03-23 MED ORDER — ONDANSETRON HCL 4 MG/2ML IJ SOLN: 4.0000 mg | Freq: Four times a day (QID) | INTRAMUSCULAR | Status: DC | PRN

## 2021-03-23 MED ORDER — BISACODYL 5 MG PO TBEC: 20.0000 mg | DELAYED_RELEASE_TABLET | Freq: Once | ORAL | Status: DC

## 2021-03-23 MED ORDER — ALBUMIN HUMAN 5 % IV SOLN
INTRAVENOUS | Status: AC
  Filled 2021-03-23: qty 500

## 2021-03-23 MED ORDER — FENTANYL CITRATE (PF) 250 MCG/5ML IJ SOLN
INTRAMUSCULAR | Status: AC
  Filled 2021-03-23: qty 5

## 2021-03-23 MED ORDER — FENTANYL CITRATE (PF) 100 MCG/2ML IJ SOLN
INTRAMUSCULAR | Status: DC | PRN
  Administered 2021-03-23 (×2): 50 ug via INTRAVENOUS
  Administered 2021-03-23: 100 ug via INTRAVENOUS
  Administered 2021-03-23: 50 ug via INTRAVENOUS

## 2021-03-23 MED ORDER — ALBUMIN HUMAN 5 % IV SOLN: INTRAVENOUS | Status: DC | PRN

## 2021-03-23 MED ORDER — FENTANYL CITRATE (PF) 100 MCG/2ML IJ SOLN
INTRAMUSCULAR | Status: AC
  Filled 2021-03-23: qty 2

## 2021-03-23 MED ORDER — LACTATED RINGERS IR SOLN
Status: DC | PRN
  Administered 2021-03-23: 1000 mL

## 2021-03-23 MED ORDER — POLYVINYL ALCOHOL 1.4 % OP SOLN: 1.0000 [drp] | OPHTHALMIC | Status: DC | PRN

## 2021-03-23 MED ORDER — KETAMINE HCL 10 MG/ML IJ SOLN
INTRAMUSCULAR | Status: DC | PRN
  Administered 2021-03-23: 20 mg via INTRAVENOUS

## 2021-03-23 MED ORDER — OXYCODONE HCL 5 MG PO TABS: 5.0000 mg | ORAL_TABLET | Freq: Once | ORAL | Status: DC | PRN

## 2021-03-23 MED ORDER — OXYCODONE HCL 5 MG/5ML PO SOLN
5.0000 mg | Freq: Once | ORAL | Status: DC | PRN
Start: 2021-03-23 — End: 2021-03-23

## 2021-03-23 MED ORDER — EPHEDRINE 5 MG/ML INJ
INTRAVENOUS | Status: AC
  Filled 2021-03-23: qty 10

## 2021-03-23 MED ORDER — CALCIUM POLYCARBOPHIL 625 MG PO TABS
625.0000 mg | ORAL_TABLET | Freq: Two times a day (BID) | ORAL | Status: DC
  Administered 2021-03-23 – 2021-03-25 (×4): 625 mg via ORAL
  Filled 2021-03-23 (×4): qty 1

## 2021-03-23 MED ORDER — ALUM & MAG HYDROXIDE-SIMETH 200-200-20 MG/5ML PO SUSP
30.0000 mL | Freq: Four times a day (QID) | ORAL | Status: DC | PRN
  Administered 2021-03-25: 30 mL via ORAL
  Filled 2021-03-23: qty 30

## 2021-03-23 MED ORDER — HYDROMORPHONE HCL 1 MG/ML IJ SOLN: 0.5000 mg | INTRAMUSCULAR | Status: DC | PRN

## 2021-03-23 MED ORDER — ENSURE PRE-SURGERY PO LIQD
592.0000 mL | Freq: Once | ORAL | Status: DC
  Filled 2021-03-23: qty 592

## 2021-03-23 MED ORDER — EPHEDRINE SULFATE-NACL 50-0.9 MG/10ML-% IV SOSY
PREFILLED_SYRINGE | INTRAVENOUS | Status: DC | PRN
  Administered 2021-03-23: 5 mg via INTRAVENOUS
  Administered 2021-03-23: 10 mg via INTRAVENOUS
  Administered 2021-03-23: 5 mg via INTRAVENOUS
  Administered 2021-03-23: 10 mg via INTRAVENOUS
  Administered 2021-03-23: 5 mg via INTRAVENOUS

## 2021-03-23 MED ORDER — SODIUM CHLORIDE 0.9% FLUSH
3.0000 mL | Freq: Two times a day (BID) | INTRAVENOUS | Status: DC
  Administered 2021-03-24: 3 mL via INTRAVENOUS

## 2021-03-23 MED ORDER — METHOCARBAMOL 1000 MG/10ML IJ SOLN
1000.0000 mg | Freq: Four times a day (QID) | INTRAVENOUS | Status: DC | PRN
  Filled 2021-03-23: qty 10

## 2021-03-23 MED ORDER — BUPIVACAINE-EPINEPHRINE (PF) 0.25% -1:200000 IJ SOLN
INTRAMUSCULAR | Status: DC | PRN
  Administered 2021-03-23: 60 mL

## 2021-03-23 MED ORDER — PROCHLORPERAZINE EDISYLATE 10 MG/2ML IJ SOLN: 5.0000 mg | Freq: Four times a day (QID) | INTRAMUSCULAR | Status: DC | PRN

## 2021-03-23 MED ORDER — PROPOFOL 10 MG/ML IV BOLUS
INTRAVENOUS | Status: DC | PRN
Start: 2021-03-23 — End: 2021-03-23
  Administered 2021-03-23: 80 mg via INTRAVENOUS

## 2021-03-23 MED ORDER — LACTATED RINGERS IV SOLN: INTRAVENOUS | Status: DC | PRN

## 2021-03-23 MED ORDER — TRAMADOL HCL 50 MG PO TABS
50.0000 mg | ORAL_TABLET | Freq: Four times a day (QID) | ORAL | Status: DC | PRN
  Administered 2021-03-23 – 2021-03-25 (×5): 50 mg via ORAL
  Filled 2021-03-23 (×5): qty 1

## 2021-03-23 MED ORDER — MIDAZOLAM HCL 5 MG/5ML IJ SOLN
INTRAMUSCULAR | Status: DC | PRN
  Administered 2021-03-23: 1 mg via INTRAVENOUS

## 2021-03-23 MED ORDER — ROCURONIUM BROMIDE 10 MG/ML (PF) SYRINGE
PREFILLED_SYRINGE | INTRAVENOUS | Status: AC
  Filled 2021-03-23: qty 10

## 2021-03-23 MED ORDER — ACETAMINOPHEN 500 MG PO TABS
1000.0000 mg | ORAL_TABLET | Freq: Four times a day (QID) | ORAL | Status: DC
  Administered 2021-03-23 – 2021-03-24 (×6): 1000 mg via ORAL
  Filled 2021-03-23 (×7): qty 2

## 2021-03-23 MED ORDER — LIDOCAINE HCL 2 % IJ SOLN
INTRAMUSCULAR | Status: AC
  Filled 2021-03-23: qty 20

## 2021-03-23 MED ORDER — LIDOCAINE 2% (20 MG/ML) 5 ML SYRINGE
INTRAMUSCULAR | Status: DC | PRN
  Administered 2021-03-23: 60 mg via INTRAVENOUS

## 2021-03-23 MED ORDER — PROPOFOL 10 MG/ML IV BOLUS
INTRAVENOUS | Status: AC
  Filled 2021-03-23: qty 20

## 2021-03-23 MED ORDER — ORAL CARE MOUTH RINSE: 15.0000 mL | Freq: Once | OROMUCOSAL | Status: AC

## 2021-03-23 MED ORDER — ENALAPRILAT 1.25 MG/ML IV SOLN
0.6250 mg | Freq: Four times a day (QID) | INTRAVENOUS | Status: DC | PRN
  Filled 2021-03-23: qty 1

## 2021-03-23 MED ORDER — 0.9 % SODIUM CHLORIDE (POUR BTL) OPTIME
TOPICAL | Status: DC | PRN
  Administered 2021-03-23: 2000 mL

## 2021-03-23 MED ORDER — ACETAMINOPHEN 500 MG PO TABS
1000.0000 mg | ORAL_TABLET | ORAL | Status: AC
  Administered 2021-03-23: 1000 mg via ORAL
  Filled 2021-03-23: qty 2

## 2021-03-23 MED ORDER — ENSURE SURGERY PO LIQD: 237.0000 mL | Freq: Two times a day (BID) | ORAL | Status: DC

## 2021-03-23 MED ORDER — DEXAMETHASONE SODIUM PHOSPHATE 4 MG/ML IJ SOLN
INTRAMUSCULAR | Status: DC | PRN
  Administered 2021-03-23: 5 mg via INTRAVENOUS

## 2021-03-23 MED ORDER — PHENYLEPHRINE 40 MCG/ML (10ML) SYRINGE FOR IV PUSH (FOR BLOOD PRESSURE SUPPORT)
PREFILLED_SYRINGE | INTRAVENOUS | Status: DC | PRN
  Administered 2021-03-23 (×3): 80 ug via INTRAVENOUS

## 2021-03-23 MED ORDER — POLYETHYLENE GLYCOL 3350 17 GM/SCOOP PO POWD: 1.0000 | Freq: Once | ORAL | Status: DC

## 2021-03-23 MED ORDER — BUPIVACAINE-EPINEPHRINE (PF) 0.25% -1:200000 IJ SOLN
INTRAMUSCULAR | Status: AC
  Filled 2021-03-23: qty 60

## 2021-03-23 MED ORDER — GABAPENTIN 300 MG PO CAPS
300.0000 mg | ORAL_CAPSULE | ORAL | Status: AC
  Administered 2021-03-23: 300 mg via ORAL
  Filled 2021-03-23: qty 1

## 2021-03-23 MED ORDER — AMISULPRIDE (ANTIEMETIC) 5 MG/2ML IV SOLN: 10.0000 mg | Freq: Once | INTRAVENOUS | Status: DC | PRN

## 2021-03-23 MED ORDER — DIPHENHYDRAMINE HCL 12.5 MG/5ML PO ELIX: 12.5000 mg | ORAL_SOLUTION | Freq: Four times a day (QID) | ORAL | Status: DC | PRN

## 2021-03-23 MED ORDER — ONDANSETRON HCL 4 MG/2ML IJ SOLN
INTRAMUSCULAR | Status: AC
  Filled 2021-03-23: qty 2

## 2021-03-23 MED ORDER — ALVIMOPAN 12 MG PO CAPS
12.0000 mg | ORAL_CAPSULE | ORAL | Status: AC
  Administered 2021-03-23: 12 mg via ORAL
  Filled 2021-03-23: qty 1

## 2021-03-23 MED ORDER — KETOROLAC TROMETHAMINE 30 MG/ML IJ SOLN: 15.0000 mg | Freq: Once | INTRAMUSCULAR | Status: DC | PRN

## 2021-03-23 MED ORDER — MELATONIN 3 MG PO TABS: 3.0000 mg | ORAL_TABLET | Freq: Every evening | ORAL | Status: DC | PRN

## 2021-03-23 SURGICAL SUPPLY — 115 items
APPLIER CLIP 5 13 M/L LIGAMAX5 (MISCELLANEOUS)
APPLIER CLIP ROT 10 11.4 M/L (STAPLE)
BAG URO CATCHER STRL LF (MISCELLANEOUS) ×2 IMPLANT
BLADE EXTENDED COATED 6.5IN (ELECTRODE) IMPLANT
CANNULA REDUC XI 12-8 STAPL (CANNULA)
CANNULA REDUCER 12-8 DVNC XI (CANNULA) IMPLANT
CATH INTERMIT  6FR 70CM (CATHETERS) ×2 IMPLANT
CELLS DAT CNTRL 66122 CELL SVR (MISCELLANEOUS) IMPLANT
CHLORAPREP W/TINT 26 (MISCELLANEOUS) IMPLANT
CLIP APPLIE 5 13 M/L LIGAMAX5 (MISCELLANEOUS) IMPLANT
CLIP APPLIE ROT 10 11.4 M/L (STAPLE) IMPLANT
CLOTH BEACON ORANGE TIMEOUT ST (SAFETY) ×2 IMPLANT
COVER SURGICAL LIGHT HANDLE (MISCELLANEOUS) ×4 IMPLANT
COVER TIP SHEARS 8 DVNC (MISCELLANEOUS) ×1 IMPLANT
COVER TIP SHEARS 8MM DA VINCI (MISCELLANEOUS) ×2
COVER WAND RF STERILE (DRAPES) ×2 IMPLANT
DECANTER SPIKE VIAL GLASS SM (MISCELLANEOUS) IMPLANT
DEVICE TROCAR PUNCTURE CLOSURE (ENDOMECHANICALS) IMPLANT
DRAIN CHANNEL 19F RND (DRAIN) ×2 IMPLANT
DRAPE ARM DVNC X/XI (DISPOSABLE) ×4 IMPLANT
DRAPE COLUMN DVNC XI (DISPOSABLE) ×1 IMPLANT
DRAPE DA VINCI XI ARM (DISPOSABLE) ×8
DRAPE DA VINCI XI COLUMN (DISPOSABLE) ×2
DRAPE INCISE IOBAN 66X45 STRL (DRAPES) ×2 IMPLANT
DRAPE SURG IRRIG POUCH 19X23 (DRAPES) ×2 IMPLANT
DRSG OPSITE POSTOP 4X10 (GAUZE/BANDAGES/DRESSINGS) IMPLANT
DRSG OPSITE POSTOP 4X6 (GAUZE/BANDAGES/DRESSINGS) ×2 IMPLANT
DRSG OPSITE POSTOP 4X8 (GAUZE/BANDAGES/DRESSINGS) IMPLANT
DRSG TEGADERM 2-3/8X2-3/4 SM (GAUZE/BANDAGES/DRESSINGS) ×2 IMPLANT
DRSG TEGADERM 4X4.75 (GAUZE/BANDAGES/DRESSINGS) IMPLANT
ELECT PENCIL ROCKER SW 15FT (MISCELLANEOUS) ×2 IMPLANT
ELECT REM PT RETURN 15FT ADLT (MISCELLANEOUS) ×2 IMPLANT
ENDOLOOP SUT PDS II  0 18 (SUTURE)
ENDOLOOP SUT PDS II 0 18 (SUTURE) IMPLANT
EVACUATOR SILICONE 100CC (DRAIN) ×2 IMPLANT
GAUZE SPONGE 2X2 8PLY STRL LF (GAUZE/BANDAGES/DRESSINGS) ×1 IMPLANT
GLOVE SURG ENC MOIS LTX SZ7.5 (GLOVE) ×2 IMPLANT
GLOVE SURG LTX SZ8 (GLOVE) ×6 IMPLANT
GLOVE SURG UNDER LTX SZ8 (GLOVE) ×6 IMPLANT
GOWN STRL REUS W/TWL LRG LVL3 (GOWN DISPOSABLE) ×4 IMPLANT
GOWN STRL REUS W/TWL XL LVL3 (GOWN DISPOSABLE) ×8 IMPLANT
GRASPER SUT TROCAR 14GX15 (MISCELLANEOUS) IMPLANT
GUIDEWIRE STR DUAL SENSOR (WIRE) ×2 IMPLANT
HOLDER FOLEY CATH W/STRAP (MISCELLANEOUS) ×2 IMPLANT
IRRIG SUCT STRYKERFLOW 2 WTIP (MISCELLANEOUS) ×2
IRRIGATION SUCT STRKRFLW 2 WTP (MISCELLANEOUS) ×1 IMPLANT
KIT PROCEDURE DA VINCI SI (MISCELLANEOUS) ×2
KIT PROCEDURE DVNC SI (MISCELLANEOUS) ×1 IMPLANT
KIT SIGMOIDOSCOPE (SET/KITS/TRAYS/PACK) ×2 IMPLANT
KIT TURNOVER KIT A (KITS) ×2 IMPLANT
MANIFOLD NEPTUNE II (INSTRUMENTS) ×2 IMPLANT
NEEDLE INSUFFLATION 14GA 120MM (NEEDLE) ×2 IMPLANT
PACK CARDIOVASCULAR III (CUSTOM PROCEDURE TRAY) ×2 IMPLANT
PACK COLON (CUSTOM PROCEDURE TRAY) ×2 IMPLANT
PACK CYSTO (CUSTOM PROCEDURE TRAY) ×2 IMPLANT
PAD POSITIONING PINK XL (MISCELLANEOUS) ×2 IMPLANT
PORT LAP GEL ALEXIS MED 5-9CM (MISCELLANEOUS) ×2 IMPLANT
PROTECTOR NERVE ULNAR (MISCELLANEOUS) ×4 IMPLANT
RELOAD STAPLER 3.5X45 BLU DVNC (STAPLE) IMPLANT
RELOAD STAPLER 3.5X60 BLU DVNC (STAPLE) IMPLANT
RELOAD STAPLER 4.3X45 GRN DVNC (STAPLE) IMPLANT
RELOAD STAPLER 4.3X60 GRN DVNC (STAPLE) ×1 IMPLANT
RTRCTR WOUND ALEXIS 18CM MED (MISCELLANEOUS)
SCISSORS LAP 5X35 DISP (ENDOMECHANICALS) ×2 IMPLANT
SEAL CANN UNIV 5-8 DVNC XI (MISCELLANEOUS) ×4 IMPLANT
SEAL XI 5MM-8MM UNIVERSAL (MISCELLANEOUS) ×8
SEALER VESSEL DA VINCI XI (MISCELLANEOUS) ×2
SEALER VESSEL EXT DVNC XI (MISCELLANEOUS) ×1 IMPLANT
SOLUTION ELECTROLUBE (MISCELLANEOUS) ×2 IMPLANT
SPONGE GAUZE 2X2 STER 10/PKG (GAUZE/BANDAGES/DRESSINGS) ×1
STAPLER 45 DA VINCI SURE FORM (STAPLE)
STAPLER 45 SUREFORM DVNC (STAPLE) IMPLANT
STAPLER 60 DA VINCI SURE FORM (STAPLE) ×2
STAPLER 60 SUREFORM DVNC (STAPLE) ×1 IMPLANT
STAPLER CANNULA SEAL DVNC XI (STAPLE) ×1 IMPLANT
STAPLER CANNULA SEAL XI (STAPLE) ×2
STAPLER ECHELON POWER CIR 29 (STAPLE) IMPLANT
STAPLER ECHELON POWER CIR 31 (STAPLE) ×2 IMPLANT
STAPLER RELOAD 3.5X45 BLU DVNC (STAPLE)
STAPLER RELOAD 3.5X45 BLUE (STAPLE)
STAPLER RELOAD 3.5X60 BLU DVNC (STAPLE)
STAPLER RELOAD 3.5X60 BLUE (STAPLE)
STAPLER RELOAD 4.3X45 GREEN (STAPLE)
STAPLER RELOAD 4.3X45 GRN DVNC (STAPLE)
STAPLER RELOAD 4.3X60 GREEN (STAPLE) ×2
STAPLER RELOAD 4.3X60 GRN DVNC (STAPLE) ×1
STOPCOCK 4 WAY LG BORE MALE ST (IV SETS) ×4 IMPLANT
SURGILUBE 2OZ TUBE FLIPTOP (MISCELLANEOUS) ×2 IMPLANT
SUT MNCRL AB 4-0 PS2 18 (SUTURE) ×2 IMPLANT
SUT PDS AB 1 CT1 27 (SUTURE) ×4 IMPLANT
SUT PROLENE 0 CT 2 (SUTURE) IMPLANT
SUT PROLENE 2 0 KS (SUTURE) ×2 IMPLANT
SUT PROLENE 2 0 SH DA (SUTURE) ×2 IMPLANT
SUT SILK 2 0 (SUTURE)
SUT SILK 2 0 SH CR/8 (SUTURE) IMPLANT
SUT SILK 2-0 18XBRD TIE 12 (SUTURE) IMPLANT
SUT SILK 3 0 (SUTURE)
SUT SILK 3 0 SH CR/8 (SUTURE) ×2 IMPLANT
SUT SILK 3-0 18XBRD TIE 12 (SUTURE) IMPLANT
SUT V-LOC BARB 180 2/0GR6 GS22 (SUTURE)
SUT VIC AB 2-0 SH 18 (SUTURE) ×2 IMPLANT
SUT VIC AB 3-0 SH 18 (SUTURE) IMPLANT
SUT VIC AB 3-0 SH 27 (SUTURE)
SUT VIC AB 3-0 SH 27XBRD (SUTURE) IMPLANT
SUT VICRYL 0 UR6 27IN ABS (SUTURE) ×2 IMPLANT
SUTURE V-LC BRB 180 2/0GR6GS22 (SUTURE) IMPLANT
SYR 10ML ECCENTRIC (SYRINGE) ×2 IMPLANT
SYS LAPSCP GELPORT 120MM (MISCELLANEOUS)
SYSTEM LAPSCP GELPORT 120MM (MISCELLANEOUS) IMPLANT
TOWEL OR NON WOVEN STRL DISP B (DISPOSABLE) IMPLANT
TRAY FOLEY MTR SLVR 16FR STAT (SET/KITS/TRAYS/PACK) ×2 IMPLANT
TROCAR ADV FIXATION 5X100MM (TROCAR) ×2 IMPLANT
TUBING CONNECTING 10 (TUBING) ×6 IMPLANT
TUBING INSUFFLATION 10FT LAP (TUBING) ×2 IMPLANT
TUBING UROLOGY SET (TUBING) IMPLANT

## 2021-03-23 NOTE — Anesthesia Procedure Notes (Signed)
Procedure Name: Intubation Date/Time: 03/23/2021 8:20 AM Performed by: Claudia Desanctis, CRNA Pre-anesthesia Checklist: Patient identified, Emergency Drugs available, Suction available and Patient being monitored Patient Re-evaluated:Patient Re-evaluated prior to induction Oxygen Delivery Method: Circle system utilized Preoxygenation: Pre-oxygenation with 100% oxygen Induction Type: IV induction Ventilation: Mask ventilation without difficulty Laryngoscope Size: 2 and Miller Grade View: Grade I Tube type: Oral Tube size: 7.0 mm Number of attempts: 1 Airway Equipment and Method: Stylet Placement Confirmation: ETT inserted through vocal cords under direct vision,  positive ETCO2 and breath sounds checked- equal and bilateral Secured at: 21 cm Tube secured with: Tape Dental Injury: Teeth and Oropharynx as per pre-operative assessment

## 2021-03-23 NOTE — Anesthesia Postprocedure Evaluation (Signed)
Anesthesia Post Note  Patient: Chloe Young  Procedure(s) Performed: XI ROBOT ASSISTED LAPAROSCOPIC PARTIAL COLECTOMY, OSTOMY TAKEDOWN,  LYSIS OF ADHESIONS, RIGID PROCTOSCOPY (N/A ) CYSTOSCOPY (N/A )     Patient location during evaluation: PACU Anesthesia Type: General Level of consciousness: awake and alert Pain management: pain level controlled Vital Signs Assessment: post-procedure vital signs reviewed and stable Respiratory status: spontaneous breathing, nonlabored ventilation, respiratory function stable and patient connected to nasal cannula oxygen Cardiovascular status: blood pressure returned to baseline and stable Postop Assessment: no apparent nausea or vomiting Anesthetic complications: no   No complications documented.  Last Vitals:  Vitals:   03/23/21 1155 03/23/21 1200  BP:    Pulse:    Resp:    Temp: (!) 35.8 C (!) 36.1 C  SpO2:      Last Pain:  Vitals:   03/23/21 1230  TempSrc:   PainSc: [place]

## 2021-03-23 NOTE — Op Note (Signed)
Preoperative diagnosis:   Diverticulitis  Postoperative diagnosis: Diverticulitis   Procedure: Cystoscopy with injection of indocyanine green dye into the ureters bilaterally  Surgeon: Pryor Curia. MD   Anesthesia:  General   Complications:  None   Indication:  Injection of ureteral indocyanine green dye was requested for intraoperative identification of the ureters during her general surgery procedure.  Description of procedure:  The patient was taken the operating room and a general anesthetic was administered.  She was given preoperative antibiotics and placed in the dorsal lithotomy position.  A preoperative time-out was performed.  She was prepped and draped in the usual sterile fashion.  Cystoscopy was performed and this revealed no evidence of bladder tumors, stones, or other significant mucosal pathology.  The ureteral orifices were in their expected anatomic location bilaterally.  A 6 French ureteral catheter was then used to intubate the right ureter and 7.5 cc of indocyanine green dye was injected into the ureter.   An identical procedure was then performed on the contralateral side.  A 16 French Foley catheter was then placed.  The patient tolerated the procedure and without complications.  The procedure was then turned over to Dr. Johney Maine for the remained of the procedure.

## 2021-03-23 NOTE — Transfer of Care (Signed)
Immediate Anesthesia Transfer of Care Note  Patient: Chloe Young  Procedure(s) Performed: XI ROBOT ASSISTED LAPAROSCOPIC PARTIAL COLECTOMY, OSTOMY TAKEDOWN,  LYSIS OF ADHESIONS, RIGID PROCTOSCOPY (N/A ) CYSTOSCOPY (N/A )  Patient Location: PACU  Anesthesia Type:General  Level of Consciousness: awake and patient cooperative  Airway & Oxygen Therapy: Patient Spontanous Breathing and Patient connected to face mask  Post-op Assessment: Report given to RN and Post -op Vital signs reviewed and stable  Post vital signs: Reviewed and stable  Last Vitals:  Vitals Value Taken Time  BP 110/65 03/23/21 1155  Temp    Pulse 86 03/23/21 1159  Resp 16 03/23/21 1159  SpO2 100 % 03/23/21 1159  Vitals shown include unvalidated device data.  Last Pain:  Vitals:   03/23/21 0546  TempSrc: Oral  PainSc: 0-No pain         Complications: No complications documented.

## 2021-03-23 NOTE — Op Note (Signed)
03/23/2021  12:00 PM  PATIENT:  Chloe Young  70 y.o. 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 An experienced assistant was required given the standard of surgical care given the complexity of the case.  This assistant was needed for exposure, dissection, suction, tissue approximation, retraction, perception, etc.  ANESTHESIA:     General  Nerve block provided with liposomal bupivacaine (Experel) mixed with 0.25% bupivacaine as a Bilateral TAP block x 59mL each side at the level of the transverse abdominis & preperitoneal spaces along the flank at the anterior axillary line, from subcostal ridge to iliac crest under laparoscopic guidance   Local field block at port sites & extraction wound  EBL:  Total I/O In: 2600 [I.V.:2000; IV Piggyback:600] Out: 600 [Urine:500; Blood:100]  Delay start of Pharmacological VTE agent (>24hrs) due to surgical blood loss or risk of bleeding:  no  DRAINS: 19 Fr Blake drain goes to the pelvis  SPECIMEN:   RECTOSIGMOID COLON STUMP (open end proximal) COLOSTOMY DISTAL ANASTOMOTIC RING (final distal  margin)  DISPOSITION OF SPECIMEN:  PATHOLOGY  COUNTS:  YES  PLAN OF CARE: Admit to inpatient   PATIENT DISPOSITION:  PACU - hemodynamically stable.  INDICATION:    With colon perforation and shock requiring emergent open Hartmann resection with colostomy.  Prolonged hospital stay.  Eventually recovery.  Rehabilitation.  She is now 5 months out from surgery and desired colostomy takedown.  She is a parastomal hernia around her colostomy with occasional discomfort.  Some partial obstructive-like symptoms as well.  I offered robotic possible open lyse each incision colostomy takedown.   The anatomy & physiology of the digestive tract was discussed.  The pathophysiology was discussed.  Possibility of remaining with an ostomy permanently was discussed.  I offered ostomy takedown.  Laparoscopic & open techniques were discussed.   Risks such as bleeding, infection, abscess, leak, reoperation, possible re-ostomy, injury to other organs, need for repair of tissues / organs, need for further treatment, hernia, heart attack, death, and other risks were discussed.   I noted a good likelihood this will help address the problem.  Goals of post-operative recovery were discussed as well.  We will work to minimize complications.  Questions were answered.  The patient expresses understanding & wishes to proceed with surgery.  OR FINDINGS:   Patient had very dense adhesions of omentum to anterior abdominal wall.  Infraumbilical stretched out diastases suspicious for hernia.  No small bowel to the anterior abdominal wall.  Infraumbilical midline wound granulated and nearly closed.  Moderate parastomal hernia around left lower quadrant colostomy.  Resection done of distal colostomy.  Some distal sigmoid present.  Resection done to proximal rectum.  The anastomosis rests 9-10 cm from the anal verge by rigid proctoscopy.  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  Lysed lesions done and released and improved.  CASE DATA:  Type of patient?: Elective [facility] Private Case  Status of Case? Elective Scheduled  Infection Present At Time Of Surgery (PATOS)?  NO  DESCRIPTION:   Informed consent was confirmed.  The patient underwent general anaesthesia without difficulty.  The patient was positioned appropriately.  VTE prevention in place.  Dr. Alinda Money was able to do cystoscopy with firefly injections of both ureters for intraoperative identification.  Please see his separate note for details.  Patient was redraped and prepped.  Colostomy had been sutured with a pursestring.  The patient was clipped, prepped, & draped in a sterile fashion.  Surgical timeout confirmed our plan.  The patient was positioned in reverse Trendelenburg.  Abdominal entry was gained using Varess technique at the left subcostal ridge on the anterior abdominal wall.  No elevated EtCO2 noted.  Patient had episode of hypotension and bradycardia.  Port placed.  Capital peritoneum evacuated.  Anesthesia was able to give atropine and corrected monitor.  Heart rate and blood pressure recovered well.  Stable for 5 minutes.  Cleared by anesthesia to proceed with surgery.  Reinsufflation done without incident.  No hemodynamic nor cardiac dysrhythmias for the rest of the case.  Camera inspection revealed small scratch on liver without the Varess needle in it.  Soft clotted.  Varess needle removed.   Extra ports were carefully placed under direct laparoscopic visualization.  Upon entering the abdomen (organ space), I encountered .   I reflected the greater omentum and the upper abdomen the small bowel in the upper abdomen.  The patient was carefully positioned.  The Intuitive daVinci robot was docked with camera & instruments carefully placed.  The patient had very dense adhesions but no major abscess.  Proceeded with robotic lyse  adhesions to free the greater omentum off the anterior abdominal wall.  There was no small bowel adherent to the anterior abdominal wall hard arguing against the granulation tissue of the infraumbilical wound being small bowel serosa.  Eventually freed those off.  Then proceeded to free off moderate small bowel adhesions to the colostomy retroperitoneum and pelvis.  I freed off some interloop adhesions as well where bowel had been twisted upon itself and one segment somewhat trapped in a closed-loop situation without any ischemia.  Mobilize adhesions around the end colostomy going down the left lower quadrant and moderate parastomal hernia.  There was no incarcerated material in the hernia itself.  Mobilized the descending colon in a lateral medial fashion as well.  Then focused down the pelvis.  Could see rectosigmoid stump densely adherent to the uterus.  Carefully freed that off and untwisted it.  I put the main pedicle on tension.  I scored the base of peritoneum of the medial side of the mesentery of the elevated left colon from the ligament of Treitz to the mid rectum.   I elevated the sigmoid mesentery and entered into the retro-mesenteric plane. We were able to identify the left ureter and gonadal vessels. We kept those posterior within the retroperitoneum and elevated the left colon mesentery off that. I did isolate the inferior mesenteric artery (IMA) pedicle but did not ligate it yet.  I continued distally and got into the avascular plane posterior to the mesorectum, sparing the nervi ergentes.. This allowed me to help mobilize the rectum as well by freeing the mesorectum off the sacrum.  I  stayed away from the right and left ureters.  I kept the lateral vascular pedicles to the rectum intact.  Transected through the thickened visceral peritoneum in the pelvis to straighten out the rectosigmoid colon.  I chose a region of the proximal rectum that was distal to an obvious rectosigmoid diverticulum.  I  transected through the mesorectum and skeletonized around the region.  I came back more proximally and transected through the superior hemorrhoidal pedicle close to the specimen side of the rectosigmoid.  I then proceeded to run the small bowel to assure no evidence of injury.  I started in the proximal ileum and ran more distally.  I freed off numerous interloop adhesions and twisted bowel until it came to the ileal cecal region.  I then began to run the small bowel more proximally, gradually freeing off interloop adhesions and untwisted the bowel until I came to the ligament of Treitz.  We ran and reinspected more time to confirm no serosal injury or other abnormality.  Hemostasis was good.  Freed adhesions of the greater omentum off the retroperitoneum and small bowel and left colon.  It was somewhat thinned out but mostly intact.  We undocked the robot.  I made a circular incision around the squama dermal junction of the colostomy and came through the dermis.  Came into the hernia sac of the parastomal hernia around the colostomy and freed the colon from its adhesions to the anterior abdominal wall.  Eviscerated and had plenty of redundant colon.   Placed a wound protector. Came about 6 cm more proximally where the colon was not inflamed or irritated.  Created a window and transected the mesentery more distally.  I clamped the colon to this area using a reusable pursestringer device.  Passed a 2-0 Keith needle. I transected at the descending/sigmoid junction with a scalpel. I got healthy bleeding mucosa.  We sent the rectosigmoid colon specimen off to go to pathology.  We sized the colon orifice.  I chose a 28mm EEA anvil stapler system.  I reinforced the prolene pursestring with interrupted silk suture.   We returned to the end colostomy with the anvil in the back into the peritoneal cavity and placed.  We redocked the robot.  I transected across the proximal rectum using a robotic 60 mm green load stapler  with 1 firing to good result.  Specimen removed.  The distal end of the remaining colon easily reached down to the rectal stump, therefore, splenic flexure mobilization was not needed. Dr Marcello Moores scrubbed down and did gentle anal dilation and advanced the EEA stapler up the rectal stump. The spike was brought out at the provimal end of the rectal stump under direct visualization.  I  attached the anvil of the proximal colon the spike of the stapler. Anvil was tightened down and held clamped for 60 seconds. The EEA stapler was fired and held clamped for 30 seconds. The stapler was released & removed. We noted 2 excellent anastomotic rings. Blue stitch is in the proximal ring.  Dr Marcello Moores  did rigid proctoscopy noted the anastomosis was at 9-10 cm from the anal verge consistent with the proximal rectum.  We irrigation of isotonic solution & held that for the pelvic air leak test .  The rectum was insufflated the rectum while clamping the colon proximal to that anastomosis.  There was a negative air leak test. There was no tension of mesentery or bowel at the anastomosis.   Tissues looked viable.  Ureters &  bowel uninjured.  Did not encounter any major bleeding but she was somewhat oozy all around.  Placed a drain down the pelvis and secured to the skin with 2-0 Prolene suture.  The anastomosis looked healthy. Greater omentum positioned down into the pelvis to help protect the anastomosis.  Endoluminal gas was evacuated.  Ports & wound protector removed.  We changed gloves & redraped the patient per colon SSI prevention protocol.  We aspirated the antibiotic irrigation.  Hemostasis was good.  Sterile unused instruments were used from this point.  I closed the skin at the port sites using Monocryl stitch and sterile dressing.  I closed the parastomal hernia fascia vertically using running #1 PDS suture.  Mobilized some skin facts and transected some redundant fat in dogears.  I closed the colostomy wound using  interrupted 2-0 Vicryl deep subcu and deep dermal sutures.  I placed antibiotic-soaked wicks into the closure at the corners x2.  I placed sterile dressings.     Patient is being extubated go to recovery room. I had discussed postop care with the patient in detail the office & in the holding area. Instructions are written. I discussed operative findings, updated the patient's status, discussed probable steps to recovery, and gave postoperative recommendations to the patient's son, Randall Hiss.  I then called & d/w her husband as well.   Recommendations were made.  Questions were answered.  They expressed understanding & appreciation.  [name], M.D., F.A.C.S. Gastrointestinal and Minimally Invasive Surgery [facility] [place], [place] [address] [phone] Main / Paging

## 2021-03-23 NOTE — H&P (Signed)
Chloe Young  DOB: 1951-09-06 Married / Language: [name] / Race: White Female  Patient Care Team: Binnie Rail, MD as PCP - General (Internal Medicine) Charlton Haws, [facility] as Pharmacist (Pharmacist) [name], MD as Consulting Physician (Gastroenterology) Stark Klein, MD as Consulting Physician (General Surgery) [name], MD as Consulting Physician (Colon and Rectal Surgery) [name], MD as Consulting Physician (Cardiology)  ` ` Patient sent for surgical consultation at the request of Dr Barry Dienes  Chief Complaint: Perforated diverticulitis status post [facility] resection 11/08/2020. Parastomal hernia. Consideration of colostomy takedown. ` ` The patient is a patient was found to have free air in her cart emergency surgery November 08 2020. 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 She came in and some shock. Was in the hospital for 2 weeks. Had prolonged ileus and IV nutrition but eventually improved. She was left with an open wound that is taken time to close down. Home health stop seeing her so she's been continuing changing the dressing twice a day. Doing little bit of saline on it. Notices minimal yellow on the gauze. She is back to walking her dog a mile and a half a day. She is eating normal food. Drinks a supplemental Ensure shake. Energy level is improving. She did lose about 15 pounds around the time of emergency surgery. She empties a colostomy in the morning. Sometimes feel a time. She does get pain and discomfort below the ostomy. Seems related which she has a hard bowel movement. However does not stop her from walking. She is not on a fiber supplement. He does not smoke. Husband is a smoker. Apparently I operated on him as well. She initially came in with some concern for some ST changes and weakness when she went to the emergency room. She was seen by  cardiology. No evidence of MI. Sinus tachycardia related to her peritonitis. Had an echocardiogram 9 years ago that showed good function. Did not have any post-Hartmann cardiac issues.  (Review of systems as stated in this history (HPI) or in the review of systems. Otherwise all other 12 point ROS are negative) ` ` ###########################################`  This patient encounter took 45 minutes today to perform the following: obtain history, perform exam, review outside records, interpret tests & imaging, counsel the patient on their diagnosis; and, document this encounter, including findings & plan in the electronic health record (EHR).   ##############################################  PREOPERATIVE DIAGNOSIS: Perforated diverticulitis.  POSTOPERATIVE DIAGNOSIS: Perforated diverticulitis.  PROCEDURE PERFORMED: sigmoid colectomy and end colostomy (Hartmann's procedure)  SURGEON: Stark Klein, MD  ANESTHESIA: General.  FINDINGS: generalized peritonitis  ESTIMATED BLOOD LOSS: 100 mL.  COMPLICATIONS: None known.  PROCEDURE: Patient was identified in the holding area and taken to the operating room where she was placed supine on the operating room table. General anesthesia was induced. Right arm was tucked and foley catheter was placed. The abdomen was prepped and draped in a sterile fashion. Time-out was performed according to the surgical safety check list. When all was correct we continued.  A midline incision was made with a #10 blade and the subcutaneous tissues were divided with a Bovie electrocautery. The fascia was opened in the midline. Purulent fluid was aspirated from the abdomen. The omentum was reflected superiorly. The small bowel was gently pulled up out of the abdomen and pelvis. There were multiple loops with significant inflammatory rind on them, but no small bowel perforation was found. The small bowel  was packed into the upper abdomen. The  sigmoid was then examined. The distal sigmoid had a very tiny perforation and a region of inflammation that was short, around 5 cm in length. The proximal sigmoid was identified and was not inflamed. This was then divided with the GIA 75 mm stapler.   The Balfour retractor was used to facilitate visibility. The Ligasure was used to divide the mesocolon high up near the colon wall going distally. The rectum was divided with the contour stapler distal to the site of inflammation. The abdomen was then irrigated copiously.   Attention was directed to the left abdomen for a location for an ostomy. Kochers were used to pull the fascia to the midline and an another Fransisca Kaufmann was used to elevate the skin to create a circular site. A divot of fatty tissue was taken as well. The fascia was opened in a cruciate fashion separating the longitudinal fibers of the rectus. A lap was placed behind the fascia to ensure that the Bovie could not go through and puncture any of the intra-abdominal organs. A Kary Kos was then advanced through the abdominal wall and used to retract the stump of the descending colon through the abdominal wall. The distal descending colon was freed up just a little to facilitate its reach through the abdominal wall.   The abdomen was then closed using running #1 looped PDS sutures. The wound was then irrigated and packed with moist Kerlix. The colon was then opened with the Bovie. 3-0 Vicryl interrupted sutures were used to create the ostomy. This was then dressed with a 2 piece wafer.  The patient was allowed to emerge from anesthesia and taken to the PACU in stable condition. Needle, sponge, and instrument counts were correct x 2.    Electronically signed by Stark Klein, MD at 11/08/2020 10:02 PM   #########################################################  SURGICAL PATHOLOGY CASE: [id number] PATIENT: Chloe Young Surgical Pathology  Report     Clinical History: bowel obstruction (cm)     FINAL MICROSCOPIC DIAGNOSIS:  A. COLON, SIGMOID, RESECTION: - Segment of colon (9.5 cm) showing diverticulosis and diverticulitis with associated fibrosis, microabscesses and very small focus of perforation - Margins appear viable     Beryle Zeitz DESCRIPTION:  Specimen: Sigmoid colon, received with both ends stapled. Suture at 1 and designates distal, received fresh Length: 9.5 cm Serosa: Tan-pink with abundant prominent indurated mesocolonic adipose tissue. There is tan-white plaque-like exudate on the soft tissue at the distal end. In this area, there is a pinpoint possible perforation. Contents: None Mucosa/Wall: Wall averages 0.5 cm, the lumen is markedly narrowed without obstruction. Mucosa is tan-pink with prominent redundant mucosal folding. In the area of possible perforation, there are several diverticula present averaging 1 cm. Discrete mucosal lesions not grossly seen. Lymph nodes: None seen Block Summary: 1 = proximal margin en face 2 = distal margin en face 3 = perforation 4-5 = additional diverticula (AK 11/09/2020)     Final Diagnosis performed by Jaquita Folds, MD. Electronically signed 11/10/2020 Technical component performed at [facility]. [facility], [place] [address] [place], [place] [address]. Professional component performed at [facility], [place] [address] [place], [place] [address]. Immunohistochemistry Technical component (if applicable) was performed at [facility]. [address] [place], [place], [place] [address]. IMMUNOHISTOCHEMISTRY DISCLAIMER (if applicable): Some of these immunohistochemical stains may have been developed and the performance characteristics determine by [facility]. Some may not have been cleared or approved by the U.S. Food  and Drug Administration. The FDA has determined that  such clearance or approval is not necessary. This test is used for clinical purposes. It should not be regarded as investigational or for research. This laboratory is certified under the [place] (CLIA-88) as qualified to perform high complexity clinical laboratory testing. The controls stained appropriately.   Allergies (Kheana Marshall-McBride, CMA; 02/14/2021 11:43 AM) No Known Allergies [02/14/2021]: No Known Drug Allergies [12/20/2020]: Allergies Reconciled  Medication History (Kheana Marshall-McBride, CMA; 02/14/2021 11:43 AM) oxyCODONE HCl (5MG  Tablet, 1 (one) Oral every six hours, as needed, Taken starting 01/11/2021) Active. Methocarbamol (500MG  Tablet, 1 (one) Oral four times daily, as needed, Taken starting 02/08/2021) Active. Alendronate Sodium (70MG  Tablet, Oral) Active. Fluticasone Propionate (50MCG/ACT Suspension, Nasal) Active. hydroCHLOROthiazide (12.5MG  Capsule, Oral) Active. Metoprolol Tartrate (50MG  Tablet, Oral) Active. Omeprazole (20MG  Capsule DR, Oral) Active. Rosuvastatin Calcium (5MG  Tablet, Oral) Active. Topiramate (100MG  Tablet, Oral) Active. Tylenol (500MG  Capsule, 1 (one) Oral) Active. Lyrica (150MG  Capsule, Oral) Active. Calcium Carbonate (Oral) Specific strength unknown - Active. Multiple Vitamin (1 (one) Oral) Active. Medications Reconciled    Vitals (Kheana Marshall-McBride CMA; 02/14/2021 11:44 AM) 02/14/2021 11:43 AM Weight: 131.13 lb Height: 66in Body Surface Area: 1.67 m Body Mass Index: 21.16 kg/m  Temp.: 97.27F  Pulse: 84 (Regular)  P.OX: 99% (Room air) BP: 118/68(Sitting, Left Arm, Standard)        Physical Exam [name] MD; 02/14/2021 12:15 PM)  General Mental Status-Alert. General Appearance-Not in acute distress, Not Sickly. Orientation-Oriented X3. Hydration-Well hydrated. Voice-Normal.  Integumentary Global  Assessment Upon inspection and palpation of skin surfaces of the - Axillae: non-tender, no inflammation or ulceration, no drainage. and Distribution of scalp and body hair is normal. General Characteristics Temperature - normal warmth is noted.  Head and Neck Head-normocephalic, atraumatic with no lesions or palpable masses. Face Global Assessment - atraumatic, no absence of expression. Neck Global Assessment - no abnormal movements, no bruit auscultated on the right, no bruit auscultated on the left, no decreased range of motion, non-tender. Trachea-midline. Thyroid Gland Characteristics - non-tender.  Eye Eyeball - Left-Extraocular movements intact, No Nystagmus - Left. Eyeball - Right-Extraocular movements intact, No Nystagmus - Right. Cornea - Left-No Hazy - Left. Cornea - Right-No Hazy - Right. Sclera/Conjunctiva - Left-No scleral icterus, No Discharge - Left. Sclera/Conjunctiva - Right-No scleral icterus, No Discharge - Right. Pupil - Left-Direct reaction to light normal. Pupil - Right-Direct reaction to light normal.  ENMT Ears Pinna - Left - no drainage observed, no generalized tenderness observed. Pinna - Right - no drainage observed, no generalized tenderness observed. Nose and Sinuses External Inspection of the Nose - no destructive lesion observed. Inspection of the nares - Left - quiet respiration. Inspection of the nares - Right - quiet respiration. Mouth and Throat Lips - Upper Lip - no fissures observed, no pallor noted. Lower Lip - no fissures observed, no pallor noted. Nasopharynx - no discharge present. Oral Cavity/Oropharynx - Tongue - no dryness observed. Oral Mucosa - no cyanosis observed. Hypopharynx - no evidence of airway distress observed.  Chest and Lung Exam Inspection Movements - Normal and Symmetrical. Accessory muscles - No use of accessory muscles in breathing. Palpation Palpation of the chest reveals -  Non-tender. Auscultation Breath sounds - Normal and Clear.  Cardiovascular Auscultation Rhythm - Regular. Murmurs & Other Heart Sounds - Auscultation of the heart reveals - No Murmurs and No Systolic Clicks.  Abdomen Inspection Inspection of the abdomen reveals - No Visible peristalsis and  No Abnormal pulsations. Umbilicus - No Bleeding, No Urine drainage. Palpation/Percussion Palpation and Percussion of the abdomen reveal - Soft, Non Tender, No Rebound tenderness, No Rigidity (guarding) and No Cutaneous hyperesthesia. Note: Midline incision with newly epithelialized wound. No exposed granulation tissue. No infection or abscess. No definite hernia although she has a thin stretchy abdominal wall.  Colostomy left side with bulging around it suspicious for parastomal hernia. Mildly sensitive on inferior ridge. Ostomy pink with gas and stool in bag.  Abdomen soft. Nontender. Not distended. No guarding.  Peripheral Vascular Upper Extremity Inspection - Left - No Cyanotic nailbeds - Left, Not Ischemic. Inspection - Right - No Cyanotic nailbeds - Right, Not Ischemic.  Neurologic Neurologic evaluation reveals -normal attention span and ability to concentrate, able to name objects and repeat phrases. Appropriate fund of knowledge , normal sensation and normal coordination. Mental Status Affect - not angry, not paranoid. Cranial Nerves-Normal Bilaterally. Gait-Normal.  Neuropsychiatric Mental status exam performed with findings of-able to articulate well with normal speech/language, rate, volume and coherence, thought content normal with ability to perform basic computations and apply abstract reasoning and no evidence of hallucinations, delusions, obsessions or homicidal/suicidal ideation.  Musculoskeletal Global Assessment Spine, Ribs and Pelvis - no instability, subluxation or laxity. Right Upper Extremity - no instability, subluxation or laxity.  Lymphatic Head &  Neck  General Head & Neck Lymphatics: Bilateral - Description - No Localized lymphadenopathy. Axillary  General Axillary Region: Bilateral - Description - No Localized lymphadenopathy. Femoral & Inguinal  Generalized Femoral & Inguinal Lymphatics: Left - Description - No Localized lymphadenopathy. Right - Description - No Localized lymphadenopathy.    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  I think she has developed a parastomal hernia around the colostomy was not surprising in her emergent state and thin habitus. It is not obstructing. I recommend she do Tylenol scheduled and had a fiber supplement to soften her stool since that seems to be a trigger of discomfort. Can go up to 1 g 4 times a day. She gave her felt that narcotics did much for it so I did not offer a new prescription. Ultimately that type pain will resolve once a colostomy is taken down.  Reasonable to consider colostomy takedown. She is terrified of that will not happen. She has great exercise tolerance and nutrition is better. She's is past the three-month period when I will consider doing it at the soonest. She is very interested in proceeding.  I noted that I cannot do a mesh repair of her parastomal hernia given the risks of infection at the time of colon surgery. She wishes to have a suture repair of her parastomal hernia fascia. Hopefully 70% chance of it being adequate and avoiding hernia recurrence, especially since she is a thin female but does not smoke. She is at risk for hernia recurrence and may require laparoscopic mesh repair in the future. We will see.  She is a reasonable candidate for a robotic approach. Lysis of adhesion with colostomy takedown. Ask Urology to do firefly done at the start of the case to  identify and preserve the ureters.  Ideally she gets colonoscopy before surgery. That has been arranged. She is due to colonoscopy March 9 by Dr. Fuller Plan with [facility] gastroenterology. I think it is too short notice to be able to do the ostomy takedown the  next day, but we can see.   COLOSTOMY IN PLACE (Z93.3)  Current Plans Pt Education - [facility] Ostomy HCI (Sirr Kabel): discussed with patient and provided information.  PERFORATION OF SIGMOID COLON DUE TO DIVERTICULITIS (K57.20) Impression: Gradually recovering with wound now epithelialized. Reasonable for colostomy takedown since getting past the 3 months. From emergency surgery. 6 months at the latest.  I recommend she had a fiber supplement to help bowel function and minimize future episodes of diverticulitis.  Current Plans Pt Education - [facility] Diverticular Disease (AT)  PREOP COLON - ENCOUNTER FOR PREOPERATIVE EXAMINATION FOR GENERAL SURGICAL PROCEDURE (Z01.818)  Current Plans You are being scheduled for surgery- Our schedulers will call you.  You should hear from our office's scheduling department within 5 working days about the location, date, and time of surgery. We try to make accommodations for patient's preferences in scheduling surgery, but sometimes the OR schedule or the surgeon's schedule prevents [place] from making those accommodations.  If you have not heard from our office [phone]) in 5 working days, call the office and ask for your surgeon's nurse.  If you have other questions about your diagnosis, plan, or surgery, call the office and ask for your surgeon's nurse.  Written instructions provided The anatomy & physiology of the digestive tract was discussed. The pathophysiology was discussed. Possibility of remaining with an ostomy permanently was discussed. I offered ostomy takedown. Laparoscopic & open techniques were discussed.  Risks such as bleeding, infection, abscess, leak, reoperation, possible  re-ostomy, injury to other organs, hernia, heart attack, death, and other risks were discussed. I noted a good likelihood this will help address the problem. Goals of post-operative recovery were discussed as well. We will work to minimize complications. Questions were answered. The patient expresses understanding & wishes to proceed with surgery.  Pt Education - [facility] Colon Bowel Prep 2018 ERAS/Miralax/Antibiotics Started Neomycin Sulfate 500 MG Oral Tablet, 2 (two) Tablet SEE NOTE, #6, 02/14/2021, No Refill. Local Order: Pharmacist Notes: TAKE TWO TABLETS AT 2 PM, 3 PM, AND 10 PM THE DAY PRIOR TO SURGERY Started metroNIDAZOLE 500 MG Oral Tablet, 2 (two) Tablet three times daily, #6, 1 day starting 02/14/2021, No Refill. Local Order: Pharmacist Notes: Pharmacy Instructions: Take 2 tablets at 2pm, 3pm, and 10pm the day prior to your colon operation. Pt Education - Pamphlet Given - Laparoscopic Colorectal Surgery: discussed with patient and provided information. Pt Education - [facility] Colectomy post-op instructions: discussed with patient and provided information.  [name], MD, FACS, MASCRS Gastrointestinal and Minimally Invasive Surgery  [facility] [address] [place], [place] [address] [phone] Fax [phone] Main/Paging  CONTACT INFORMATION: Weekday (9AM-5PM) concerns: Call [facility] main office at [phone] Weeknight (5PM-9AM) or Weekend/Holiday concerns: Check [email or web address] for General Surgery [facility] coverage (Please, do not use SecureChat as it is not reliable communication to operating surgeons for immediate patient care)

## 2021-03-23 NOTE — Interval H&P Note (Signed)
History and Physical Interval Note:  03/23/2021 7:26 AM  Chloe Young  has presented today for surgery, with the diagnosis of COLOSTOMY FOR COLON RESECTION, DESIRE FOR OSTOMY TAKEDOWN.  The various methods of treatment have been discussed with the patient and family. After consideration of risks, benefits and other options for treatment, the patient has consented to  Procedure(s) with comments: XI ROBOT ASSISTED LAPAROSCOPIC PARTIAL COLECTOMY; ROBOTIC OSTOMY TAKEDOWN  LYSIS OF ADHESION  PROCTOSCOPY (N/A) - CASE IS 4 HOURS TOTAL THIS TIME WILL INCLUDE TIME FOR UROLOGY CYSTOSCOPY (N/A) as a surgical intervention.  The patient's history has been reviewed, patient examined, no change in status, stable for surgery.  I have reviewed the patient's chart and labs.  Questions were answered to the patient's satisfaction.   The anatomy & physiology of the digestive tract was discussed.  The pathophysiology was discussed.  Possibility of remaining with an ostomy permanently was discussed.  I offered ostomy takedown.  Laparoscopic & open techniques were discussed.   Risks such as bleeding, infection, abscess, leak, reoperation, possible re-ostomy, injury to other organs, need for repair of tissues / organs, need for further treatment, hernia, heart attack, death, and other risks were discussed.   I noted a good likelihood this will help address the problem.  Goals of post-operative recovery were discussed as well.  We will work to minimize complications.  Questions were answered.  The patient expresses understanding & wishes to proceed with surgery.  I have re-reviewed the the patient's records, history, medications, and allergies.  I have re-examined the patient.  I again discussed intraoperative plans and goals of post-operative recovery.  The patient agrees to proceed.  Chloe Young  01/07/1951 [id number]  Patient Care Team: Binnie Rail, MD as PCP - General (Internal Medicine) Charlton Haws, [facility]  as Pharmacist (Pharmacist) [name], MD as Consulting Physician (Gastroenterology) Stark Klein, MD as Consulting Physician (General Surgery) [name], MD as Consulting Physician (Colon and Rectal Surgery) [name], MD as Consulting Physician (Cardiology)  Patient Active Problem List   Diagnosis Date Noted   Peritonitis [facility]) 11/10/2020   Severe sepsis ([place]) 11/08/2020   Perforated abdominal viscus    Chronic nasal congestion 03/10/2020   Lumbar radiculopathy 06/15/2016   Cervicogenic headache 06/15/2016   Osteopenia 06/15/2016   GERD (gastroesophageal reflux disease) 06/15/2016   Fasting hyperglycemia 11/25/2013   Nonspecific abnormal electrocardiogram (ECG) (EKG) 05/28/2012   Cervical post-laminectomy syndrome 03/18/2012   Osteoarthritis of both knees 03/18/2012   RAYNAUD'S SYNDROME 10/11/2010   NECK PAIN, CHRONIC 04/19/2010   Hyperlipidemia 04/19/2009   Essential hypertension 04/19/2009    Past Medical History:  Diagnosis Date   Cervical facet syndrome    Cervical post-laminectomy syndrome    Dr Tessa [facility]   Cervicalgia    Chronic pain syndrome    GERD (gastroesophageal reflux disease)    Headache    due to neck fusion   Hyperlipidemia    joint pain with statin   Hypertension    Osteoarthritis, localized, knee    OA AND PAIN LEFT KNEE  -- S/P RIGHT  KNEE ARTHROPLASTY ON 82012--KNEE IS DOING WELL.   Osteopenia 06/2016    T score -1.6 FRAX not calculated    Past Surgical History:  Procedure Laterality Date   CERVIAL FUSION  2006   Dr Arnoldo Morale, [place]   COLECTOMY WITH COLOSTOMY CREATION/HARTMANN PROCEDURE  11/08/2020   Procedure: SIGMOID COLECTOMY WITH COLOSTOMY CREATION/HARTMANN PROCEDURE;  Surgeon: Stark Klein, MD;  Location: [place] [facility];  Service: General;;   COLONOSCOPY  2011    Dr Fuller Plan   [facility]  09/03/13   L 5- S1 ; Dr Nelva Bush   JOINT REPLACEMENT  AUG 2012    RIGHT KNEE ARTHROPLASTY   KNEE ARTHROSCOPY     bilaterally; Dr Tonita Cong    LAPAROTOMY N/A 11/08/2020   Procedure: EXPLORATORY LAPAROTOMY;  Surgeon: Stark Klein, MD;  Location: [place];  Service: General;  Laterality: N/A;   TOTAL KNEE ARTHROPLASTY  01/11/2012   Procedure: TOTAL KNEE ARTHROPLASTY;  Surgeon: Johnn Hai, MD;  Location: [facility];  Service: Orthopedics;  Laterality: Left;  General with Femoral nerve block    Social History   Socioeconomic History   Marital status: Married    Spouse name: Not on file   Number of children: 1   Years of education: Not on file   Highest education level: Not on file  Occupational History   Occupation: HAIRDRESSER    Employer: [employer]  Tobacco Use   Smoking status: Never Smoker   Smokeless tobacco: Never Used  Scientific laboratory technician Use: Never used  Substance and Sexual Activity   Alcohol use: Yes    Alcohol/week: 14.0 standard drinks    Types: 14 Shots of liquor per week    Comment: 2 DRINKS PER NIGHT   Drug use: No   Sexual activity: Yes    Birth control/protection: Post-menopausal  Other Topics Concern   Not on file  Social History Narrative   Not on file   Social Determinants of Health   Financial Resource Strain: Low Risk    Difficulty of Paying Living Expenses: Not hard at all  Food Insecurity: Not on file  Transportation Needs: Not on file  Physical Activity: Not on file  Stress: Not on file  Social Connections: Not on file  Intimate Partner Violence: Not on file    Family History  Problem Relation Age of Onset   Diverticulitis Mother 24       with fissures.Marland Kitchenopted for no surgery   Cancer Father 51        malignant brain tumor   Breast cancer Maternal Aunt    Diabetes Maternal Grandfather    Heart failure Maternal Grandfather    Stroke Maternal Uncle        in 89s   Heart disease Maternal Uncle        X4 ; 1 had MI @ 27   Hypertension Sister    Diverticulitis Sister    Colon cancer Neg Hx    Colon polyps Neg Hx    Esophageal cancer Neg Hx    Rectal cancer Neg Hx     Stomach cancer Neg Hx     Medications Prior to Admission  Medication Sig Dispense Refill Last Dose   acetaminophen (TYLENOL) 500 MG tablet Take 1 tablet (500 mg total) by mouth every 6 (six) hours as needed for moderate pain. 30 tablet 0 Past Month at Unknown time   alendronate (FOSAMAX) 70 MG tablet TAKE 1 TABLET BY MOUTH  EVERY 7 DAYS TAKE WITH A  FULL GLASS OF WATER ON AN  EMPTY STOMACH (Patient taking differently: Take 70 mg by mouth every Thursday. TAKE 1 TABLET BY MOUTH  EVERY 7 DAYS TAKE WITH A  FULL GLASS OF WATER ON AN  EMPTY STOMACH) 12 tablet 3 03/17/2021   Calcium Carbonate-Vitamin D (CALCIUM + D PO) Take 1 tablet by mouth daily.   03/22/2021 at Unknown time  carboxymethylcellul-glycerin (LUBRICANT DROPS/DUAL-ACTION) 0.5-0.9 % ophthalmic solution Place 1 drop into both eyes 3 (three) times daily as needed for dry eyes.   03/23/2021 at 0400   fluticasone (FLONASE) 50 MCG/ACT nasal spray USE 1 SPRAY IN BOTH  NOSTRILS TWICE DAILY (Patient taking differently: Place 1 spray into both nostrils 2 (two) times daily as needed for allergies.) 48 g 1    hydrochlorothiazide (MICROZIDE) 12.5 MG capsule TAKE 1 CAPSULE BY MOUTH  DAILY (Patient taking differently: Take 12.5 mg by mouth in the morning.) 90 capsule 3 03/22/2021 at Unknown time   LYRICA 150 MG capsule Take 150 mg by mouth 2 (two) times daily.    03/23/2021 at 0400   metoprolol tartrate (LOPRESSOR) 50 MG tablet TAKE ONE-HALF TABLET BY  MOUTH TWICE DAILY (Patient taking differently: Take 25 mg by mouth 2 (two) times daily.) 90 tablet 3 03/23/2021 at 0400   Multiple Vitamin (MULITIVITAMIN WITH MINERALS) TABS Take 1 tablet by mouth in the morning.   03/22/2021 at Unknown time   omeprazole (PRILOSEC) 20 MG capsule TAKE 1 CAPSULE BY MOUTH  DAILY (Patient taking differently: Take 20 mg by mouth in the morning.) 90 capsule 3 03/23/2021 at 0400   oxyCODONE (OXY IR/ROXICODONE) 5 MG immediate release tablet Take 1 tablet (5 mg total) by mouth every 6 (six)  hours as needed for severe pain. 10 tablet 0 Past Week at Unknown time   rosuvastatin (CRESTOR) 5 MG tablet TAKE 1 TABLET BY MOUTH  DAILY (Patient taking differently: Take 5 mg by mouth in the morning.) 90 tablet 3 03/22/2021 at Unknown time   topiramate (TOPAMAX) 100 MG tablet Take 100 mg by mouth 2 (two) times daily.   03/23/2021 at 0400    Current Facility-Administered Medications  Medication Dose Route Frequency Provider Last Rate Last Admin   bupivacaine liposome (EXPAREL) 1.3 % injection 266 mg  20 mL Infiltration Once [name], MD       cefoTEtan (CEFOTAN) 2 g in sodium chloride 0.9 % 100 mL IVPB  2 g Intravenous On Call to OR [name], MD       Chlorhexidine Gluconate Cloth 2 % PADS 6 each  6 each Topical Once [name], MD       And   Chlorhexidine Gluconate Cloth 2 % PADS 6 each  6 each Topical Once [name], MD       Derrill Memo ON 03/24/2021] feeding supplement (ENSURE PRE-SURGERY) liquid 296 mL  296 mL Oral Once [name], MD       feeding supplement (ENSURE PRE-SURGERY) liquid 592 mL  592 mL Oral Once [name], MD       lactated ringers infusion   Intravenous Continuous Suzette Battiest, MD 10 mL/hr at 03/23/21 6433 Restarted at 03/23/21 0711     No Known Allergies  BP 111/68   Pulse 64   Temp 97.6 F (36.4 C) (Oral)   Resp 17   Ht 5\' 3"  (1.6 m)   Wt 58.1 kg   SpO2 100%   BMI 22.67 kg/m   Labs: No results found for this or any previous visit (from the past 48 hour(s)).  Imaging / Studies: No results found.   [name], M.D., F.A.C.S. Gastrointestinal and Minimally Invasive Surgery [facility] [address] [place], [place] [address] [phone] Main / Paging  03/23/2021 7:27 AM     [name]

## 2021-03-24 ENCOUNTER — Encounter (HOSPITAL_COMMUNITY): Payer: Self-pay | Admitting: Surgery

## 2021-03-24 DIAGNOSIS — K435 Parastomal hernia without obstruction or  gangrene: Secondary | ICD-10-CM

## 2021-03-24 LAB — BASIC METABOLIC PANEL
Anion gap: 7 (ref 5–15)
BUN: 6 mg/dL — ABNORMAL LOW (ref 8–23)
CO2: 21 mmol/L — ABNORMAL LOW (ref 22–32)
Calcium: 8.1 mg/dL — ABNORMAL LOW (ref 8.9–10.3)
Chloride: 102 mmol/L (ref 98–111)
Creatinine, Ser: 0.52 mg/dL (ref 0.44–1.00)
GFR, Estimated: >60 mL/min (ref >60)
Glucose, Bld: 113 mg/dL — ABNORMAL HIGH (ref 70–99)
Potassium: 3.3 mmol/L — ABNORMAL LOW (ref 3.5–5.1)
Sodium: 130 mmol/L — ABNORMAL LOW (ref 135–145)

## 2021-03-24 LAB — CBC
HCT: 29 % — ABNORMAL LOW (ref 36.0–46.0)
Hemoglobin: 9.3 g/dL — ABNORMAL LOW (ref 12.0–15.0)
MCH: 27.4 pg (ref 26.0–34.0)
MCHC: 32.1 g/dL (ref 30.0–36.0)
MCV: 85.5 fL (ref 80.0–100.0)
Platelets: 207 10*3/uL (ref 150–400)
RBC: 3.39 MIL/uL — ABNORMAL LOW (ref 3.87–5.11)
RDW: 15.3 % (ref 11.5–15.5)
WBC: 8.1 10*3/uL (ref 4.0–10.5)
nRBC: 0 % (ref 0.0–0.2)

## 2021-03-24 LAB — MAGNESIUM: Magnesium: 1.7 mg/dL (ref 1.7–2.4)

## 2021-03-24 LAB — SURGICAL PATHOLOGY

## 2021-03-24 MED ORDER — SODIUM CHLORIDE 0.9% FLUSH: 3.0000 mL | INTRAVENOUS | Status: DC | PRN

## 2021-03-24 MED ORDER — SODIUM CHLORIDE 0.9 % IV SOLN: 250.0000 mL | INTRAVENOUS | Status: DC | PRN

## 2021-03-24 MED ORDER — MAGNESIUM SULFATE 2 GM/50ML IV SOLN
2.0000 g | Freq: Once | INTRAVENOUS | Status: AC
  Administered 2021-03-24: 2 g via INTRAVENOUS
  Filled 2021-03-24: qty 50

## 2021-03-24 MED ORDER — SODIUM CHLORIDE 0.9% FLUSH
3.0000 mL | Freq: Two times a day (BID) | INTRAVENOUS | Status: DC
  Administered 2021-03-24 – 2021-03-25 (×2): 3 mL via INTRAVENOUS

## 2021-03-24 MED ORDER — LACTATED RINGERS IV BOLUS: 1000.0000 mL | Freq: Three times a day (TID) | INTRAVENOUS | Status: DC | PRN

## 2021-03-24 MED ORDER — POTASSIUM CHLORIDE CRYS ER 20 MEQ PO TBCR
40.0000 meq | EXTENDED_RELEASE_TABLET | Freq: Every day | ORAL | Status: DC
  Administered 2021-03-24 – 2021-03-25 (×2): 40 meq via ORAL
  Filled 2021-03-24 (×2): qty 2

## 2021-03-24 NOTE — Progress Notes (Signed)
Chloe Young [id number] 07-07-1951  CARE TEAM:  PCP: Binnie Rail, MD  Outpatient Care Team: Patient Care Team: Binnie Rail, MD as PCP - General (Internal Medicine) Charlton Haws, [facility] as Pharmacist (Pharmacist) [name], MD as Consulting Physician (Gastroenterology) Stark Klein, MD as Consulting Physician (General Surgery) [name], MD as Consulting Physician (Colon and Rectal Surgery) [name], MD as Consulting Physician (Cardiology)  Inpatient Treatment Team: Treatment Team: Attending Provider: [name], MD; Technician: Ernest Mallick, NT; Technician: Lewanda Rife, NT; Registered Nurse: Kai Levins, RN   Problem List:   Principal Problem:   Parastomal hernia s/p robotic colostomy takedown 03/23/2021 Active Problems:   Hyperlipidemia   Essential hypertension   Hypokalemia   GERD (gastroesophageal reflux disease)   Diverticulitis of large intestine with perforation and abscess s/p Hartmann/colostomy  11/08/2020   Hypomagnesemia   1 Day Post-Op  03/23/2021  POST-OPERATIVE DIAGNOSIS:   COLOSTOMY FOR COLON RESECTION, DESIRE FOR OSTOMY TAKEDOWN PARASTOMAL HERNIA INCISIONAL HERNIA  PROCEDURE:  ROBOTIC COLOSTOMY TAKEDOWN ROBOTIC RECTOSIGMOID RESECTION & COLOSTOMY EXCISION ROBOTIC LYSIS OF ADHESIONS RIGID PROCTOSCOPY PRIMARY PARASTOMAL HERNIA REPAIR  SURGEON:  [name], MD  Operative report 03/23/2021  Preoperative diagnosis:   Diverticulitis  Postoperative diagnosis: Diverticulitis   Procedure: Cystoscopy with injection of indocyanine green dye into the ureters bilaterally  Surgeon: Pryor Curia MD    Assessment  OK  [facility] Stay = 1 days)  Plan:  -adv diet -follow Hgb -ERAS protocol -GERD control -HTN control -VTE prophylaxis- SCDs, etc -mobilize as tolerated to help recovery  Disposition:  Disposition:  The patient is from: Home  Anticipate discharge to:   Home  Anticipated Date of Discharge is:  April 1,2022    Barriers to discharge:  Pending Clinical improvement (more likely than not)  Patient currently is NOT MEDICALLY STABLE for discharge from the hospital from a surgery standpoint.      25 minutes spent in review, evaluation, examination, counseling, and coordination of care.   I have reviewed this patient's available data, including medical history, events of note, physical examination and test results as part of my evaluation.  A significant portion of that time was spent in counseling.  Care during the described time interval was provided by me.  03/24/2021    Subjective: (Chief complaint)  Denies pain.  In chair.  Tolerating liquids  Objective:  Vital signs:  Vitals:   03/23/21 2232 03/23/21 2320 03/24/21 0145 03/24/21 0612  BP: (!) 90/55 (!) 84/52 (!) 93/53 99/61  Pulse: 77 74 73 74  Resp: 16  16 18   Temp: 99.9 F (37.7 C)  98.9 F (37.2 C) 98.4 F (36.9 C)  TempSrc: Oral  Oral Oral  SpO2: 97% 98% 96% 98%  Weight:      Height:        Last BM Date: 03/22/21  Intake/Output   Yesterday:  03/30 0701 - 03/31 0700 In: 2857.8 [P.O.:60; I.V.:2197.8; IV Piggyback:600] Out: 4098 [Urine:1325; Drains:365; Blood:100] This shift:  Total I/O In: -  Out: 395 [Urine:350; Drains:45]  Bowel function:  Flatus: YES  BM:  No  Drain: Serosanguinous   Physical Exam:  General: Pt awake/alert in no acute distress Eyes: PERRL, normal EOM.  Sclera clear.  No icterus Neuro: CN II-XII intact w/o focal sensory/motor deficits. Lymph: No head/neck/groin lymphadenopathy Psych:  No delerium/psychosis/paranoia.  Oriented x 4 HENT: Normocephalic, Mucus membranes moist.  No thrush Neck: Supple, No tracheal deviation.  No obvious  thyromegaly Chest: No pain to chest wall compression.  Good respiratory excursion.  No audible wheezing CV:  Pulses intact.  Regular rhythm.  No major extremity edema MS: Normal AROM mjr joints.   No obvious deformity  Abdomen: Soft.  Nondistended.  Mildly tender at incisions only.  No evidence of peritonitis.  No incarcerated hernias.  Ext:  No deformity.  No mjr edema.  No cyanosis Skin: No petechiae / purpurea.  No major sores.  Warm and dry    Results:   Cultures: Recent Results (from the past 720 hour(s))  SARS CORONAVIRUS 2 (TAT 6-24 HRS) Nasopharyngeal Nasopharyngeal Swab     Status: None   Collection Time: 03/19/21  2:04 PM   Specimen: Nasopharyngeal Swab  Result Value Ref Range Status   SARS Coronavirus 2 NEGATIVE NEGATIVE Final    Comment: (NOTE) SARS-CoV-2 target nucleic acids are NOT DETECTED.  The SARS-CoV-2 RNA is generally detectable in upper and lower respiratory specimens during the acute phase of infection. Negative results do not preclude SARS-CoV-2 infection, do not rule out co-infections with other pathogens, and should not be used as the sole basis for treatment or other patient management decisions. Negative results must be combined with clinical observations, patient history, and epidemiological information. The expected result is Negative.  Fact Sheet for Patients: SugarRoll.be  Fact Sheet for Healthcare Providers: https://www.woods-mathews.com/  This test is not yet approved or cleared by the [place] FDA and  has been authorized for detection and/or diagnosis of SARS-CoV-2 by FDA under an Emergency Use Authorization (EUA). This EUA will remain  in effect (meaning this test can be used) for the duration of the COVID-19 declaration under Se ction 564(b)(1) of the Act, 21 U.S.C. section 360bbb-3(b)(1), unless the authorization is terminated or revoked sooner.  Performed at [facility], [place] [address] [place], [place] [address]     Labs: Results for orders placed or performed during the hospital encounter of 03/23/21 (from the past 48 hour(s))  Basic metabolic panel     Status:  Abnormal   Collection Time: 03/24/21  4:45 AM  Result Value Ref Range   Sodium 130 (L) 135 - 145 mmol/L   Potassium 3.3 (L) 3.5 - 5.1 mmol/L   Chloride 102 98 - 111 mmol/L   CO2 21 (L) 22 - 32 mmol/L   Glucose, Bld 113 (H) 70 - 99 mg/dL    Comment: Glucose reference range applies only to samples taken after fasting for at least 8 hours.   BUN 6 (L) 8 - 23 mg/dL   Creatinine, Ser 0.52 0.44 - 1.00 mg/dL   Calcium 8.1 (L) 8.9 - 10.3 mg/dL   GFR, Estimated >60 >60 mL/min    Comment: (NOTE) Calculated using the CKD-EPI Creatinine Equation (2021)    Anion gap 7 5 - 15    Comment: Performed at [facility], [place] [address] [place],  [address]  CBC     Status: Abnormal   Collection Time: 03/24/21  4:45 AM  Result Value Ref Range   WBC 8.1 4.0 - 10.5 K/uL   RBC 3.39 (L) 3.87 - 5.11 MIL/uL   Hemoglobin 9.3 (L) 12.0 - 15.0 g/dL   HCT 29.0 (L) 36.0 - 46.0 %   MCV 85.5 80.0 - 100.0 fL   MCH 27.4 26.0 - 34.0 pg   MCHC 32.1 30.0 - 36.0 g/dL   RDW 15.3 11.5 - 15.5 %   Platelets 207 150 - 400 K/uL   nRBC  0.0 0.0 - 0.2 %    Comment: Performed at [facility], [place] [address] [place], [place] [address]  Magnesium     Status: None   Collection Time: 03/24/21  4:45 AM  Result Value Ref Range   Magnesium 1.7 1.7 - 2.4 mg/dL    Comment: Performed at [facility], [place] [address] [place], [place] [address]    Imaging / Studies: No results found.  Medications / Allergies: per chart  Antibiotics: Anti-infectives (From admission, onward)   Start     Dose/Rate Route Frequency Ordered Stop   03/23/21 2000  cefoTEtan (CEFOTAN) 2 g in sodium chloride 0.9 % 100 mL IVPB        2 g 200 mL/hr over 30 Minutes Intravenous Every 12 hours 03/23/21 1425 03/23/21 2135   03/23/21 1400  neomycin (MYCIFRADIN) tablet 1,000 mg  Status:  Discontinued       "And" Linked Group Details   1,000 mg Oral 3 times per day 03/23/21 0542 03/23/21 0543    03/23/21 1400  metroNIDAZOLE (FLAGYL) tablet 1,000 mg  Status:  Discontinued       "And" Linked Group Details   1,000 mg Oral 3 times per day 03/23/21 0542 03/23/21 0543   03/23/21 0600  cefoTEtan (CEFOTAN) 2 g in sodium chloride 0.9 % 100 mL IVPB        2 g 200 mL/hr over 30 Minutes Intravenous On call to O.R. 03/23/21 0542 03/23/21 8416        Note: Portions of this report may have been transcribed using voice recognition software. Every effort was made to ensure accuracy; however, inadvertent computerized transcription errors may be present.   Any transcriptional errors that result from this process are unintentional.    [name], MD, FACS, MASCRS  Gastrointestinal and Minimally Invasive Surgery  [facility] [address], [place], [place] [address] [phone] Fax [phone] Main/Paging  CONTACT INFORMATION: Weekday (9AM-5PM) concerns: Call [facility] main office at [phone] Weeknight (5PM-9AM) or Weekend/Holiday concerns: Check [email or web address] for General Surgery [facility] coverage (Please, do not use SecureChat as it is not reliable communication to operating surgeons for immediate patient care)      03/24/2021  6:53 AM

## 2021-03-24 NOTE — TOC Initial Note (Signed)
Transition of Care [facility]) - Initial/Assessment Note   Patient Details  Name: Chloe Young MRN: [id number] Date of Birth: [birth year]  [facility] of [facility]) CM/SW Contact:    Sherie Don, LCSW Phone Number: 03/24/2021, 11:30 AM  Clinical Narrative: Patient is a 70 year old female who was admitted for parastomal hernia colostomy takedown. Readmission checklist completed due to high readmission score.  CSW met with patient to complete assessment. Per patient, she resides at home with her husband. Current DME includes a rolling walker, hemi walker, cane, and shower chair. Patient is not currently active with [facility] services, but was recently [facility] from [facility] for [facility] for colostomy care. Patient is independent with ADLs at baseline. Patient is able to afford her medications each month. Patient usually transports herself to medical appointments. TOC to follow for discharge needs.  Expected Discharge Plan: Home/Self Care Barriers to Discharge: Continued Medical Work up  Patient Goals and CMS Choice Patient states their goals for this hospitalization and ongoing recovery are:: Return home  Expected Discharge Plan and Services Expected Discharge Plan: Home/Self Care In-house Referral: Clinical Social Work Living arrangements for the past 2 months: Single Family Home             DME Arranged: N/A DME Agency: NA   Prior Living Arrangements/Services Living arrangements for the past 2 months: Single Family Home Lives with:: Spouse Patient language and need for interpreter reviewed:: Yes Do you feel safe going back to the place where you live?: Yes      Need for Family Participation in Patient Care: No (Comment) Care giver support system in place?: Yes (comment) Current home services: DME (Rolling walker, hemi walker, cane, shower chair) Criminal Activity/Legal Involvement Pertinent to Current Situation/Hospitalization: No - Comment as needed  Activities of Daily Living Home Assistive  Devices/Equipment: Eyeglasses,Ostomy supplies,Blood pressure cuff ADL Screening (condition at time of admission) Patient's cognitive ability adequate to safely complete daily activities?: Yes Is the patient deaf or have difficulty hearing?: No Does the patient have difficulty seeing, even when wearing glasses/contacts?: No Does the patient have difficulty concentrating, remembering, or making decisions?: No Patient able to express need for assistance with ADLs?: Yes Does the patient have difficulty dressing or bathing?: No Independently performs ADLs?: Yes (appropriate for developmental age) Does the patient have difficulty walking or climbing stairs?: No Weakness of Legs: None Weakness of Arms/Hands: None  Emotional Assessment Appearance:: Appears stated age Attitude/Demeanor/Rapport: Engaged Affect (typically observed): Accepting Orientation: : Oriented to Self,Oriented to Place,Oriented to  Time,Oriented to Situation Alcohol / Substance Use: Not Applicable Psych Involvement: No (comment)  Admission diagnosis:  Diverticulitis of sigmoid colon [K57.32] Patient Active Problem List   Diagnosis Date Noted  . Parastomal hernia s/p robotic colostomy takedown 03/23/2021 03/24/2021  . Hypomagnesemia 03/24/2021  . Diverticulitis of large intestine with perforation and abscess s/p Hartmann/colostomy  11/08/2020 03/23/2021  . Chronic nasal congestion 03/10/2020  . Lumbar radiculopathy 06/15/2016  . Cervicogenic headache 06/15/2016  . Osteopenia 06/15/2016  . GERD (gastroesophageal reflux disease) 06/15/2016  . Fasting hyperglycemia 11/25/2013  . Hypokalemia 11/25/2013  . Nonspecific abnormal electrocardiogram (ECG) (EKG) 05/28/2012  . Cervical post-laminectomy syndrome 03/18/2012  . Osteoarthritis of both knees 03/18/2012  . Raynaud's syndrome 10/11/2010  . NECK PAIN, CHRONIC 04/19/2010  . Hyperlipidemia 04/19/2009  . Essential hypertension 04/19/2009   PCP:  Binnie Rail,  MD Pharmacy:   [facility], [place]  [place] [address] Phone: [phone] Fax: [place], [place], [address] [place], [place] [address] Phone: [phone] Fax: [phone]  Readmission Risk Interventions Readmission Risk Prevention Plan 03/24/2021  Transportation Screening Complete  HRI or Home Care Consult Complete  Social Work Consult for [place] Planning/Counseling Complete  Palliative Care Screening Not Applicable  Medication Review Press photographer) Complete  Some recent data might be hidden

## 2021-03-25 LAB — CREATININE, SERUM
Creatinine, Ser: 0.43 mg/dL — ABNORMAL LOW (ref 0.44–1.00)
GFR, Estimated: >60 mL/min (ref >60)

## 2021-03-25 LAB — HEMOGLOBIN: Hemoglobin: 9.2 g/dL — ABNORMAL LOW (ref 12.0–15.0)

## 2021-03-25 LAB — POTASSIUM: Potassium: 3.5 mmol/L (ref 3.5–5.1)

## 2021-03-25 NOTE — TOC Transition Note (Signed)
Transition of Care [facility]) - CM/SW Discharge Note  Patient Details  Name: Chloe Young MRN: [id number] Date of Birth: [birth year]  Transition of Care [facility]) CM/SW Contact:  Sherie Don, LCSW Phone Number: 03/25/2021, 1:00 PM  Clinical Narrative: Patient in need of transportation home. Patient signed rider waiver form. CSW sent over transportation information and waiver form to [employer]. RN to call for ride once patient is ready for discharge. TOC signing off.  Final next level of care: Home/Self Care Barriers to Discharge: Barriers Resolved  Patient Goals and CMS Choice Patient states their goals for this hospitalization and ongoing recovery are:: Return home CMS Medicare.gov Compare Post Acute Care list provided to:: Patient Choice offered to / list presented to : NA  Discharge Plan and Services In-house Referral: Clinical Social Work         DME Arranged: N/A DME Agency: NA  Readmission Risk Interventions Readmission Risk Prevention Plan 03/24/2021  Transportation Screening Complete  HRI or Home Care Consult Complete  Social Work Consult for [place] Planning/Counseling Complete  Palliative Care Screening Not Applicable  Medication Review [employer]) Complete  Some recent data might be hidden

## 2021-03-25 NOTE — Care Management Important Message (Signed)
Important Message  Patient Details IM Letter given to the Patient. Name: Chloe Young MRN: [id number] Date of Birth: 25-Jul-1951   Medicare Important Message Given:  Yes     Kerin Salen 03/25/2021, 11:00 AM

## 2021-03-25 NOTE — Progress Notes (Signed)
Discharge instructions discussed with patient, verbalized agreement and understanding 

## 2021-03-25 NOTE — Discharge Instructions (Signed)
SURGERY: POST OP INSTRUCTIONS (Surgery for small bowel obstruction, colon resection, etc)   ######################################################################  EAT Gradually transition to a high fiber diet with a fiber supplement over the next few days after discharge  WALK Walk an hour a day.  Control your pain to do that.    CONTROL PAIN Control pain so that you can walk, sleep, tolerate sneezing/coughing, go up/down stairs.  HAVE A BOWEL MOVEMENT DAILY Keep your bowels regular to avoid problems.  OK to try a laxative to override constipation.  OK to use an antidairrheal to slow down diarrhea.  Call if not better after 2 tries  CALL IF YOU HAVE PROBLEMS/CONCERNS Call if you are still struggling despite following these instructions. Call if you have concerns not answered by these instructions  ######################################################################   DIET Follow a light diet the first few days at home.  Start with a bland diet such as soups, liquids, starchy foods, low fat foods, etc.  If you feel full, bloated, or constipated, stay on a ful liquid or pureed/blenderized diet for a few days until you feel better and no longer constipated. Be sure to drink plenty of fluids every day to avoid getting dehydrated (feeling dizzy, not urinating, etc.). Gradually add a fiber supplement to your diet over the next week.  Gradually get back to a regular solid diet.  Avoid fast food or heavy meals the first week as you are more likely to get nauseated. It is expected for your digestive tract to need a few months to get back to normal.  It is common for your bowel movements and stools to be irregular.  You will have occasional bloating and cramping that should eventually fade away.  Until you are eating solid food normally, off all pain medications, and back to regular activities; your bowels will not be normal. Focus on eating a low-fat, high fiber diet the rest of your life  (See Getting to [place], below).  CARE of your INCISION or WOUND It is good for closed incision and even open wounds to be washed every day.  Shower every day.  Short baths are fine.  Wash the incisions and wounds clean with soap & water.     REMOVE ALL DRESSINGS BY Saturday 03/26/2021  If you have a closed incision(s), wash the incision with soap & water every day.  You may leave closed incisions open to air if it is dry.   You may cover the incision with clean gauze & replace it after your daily shower for comfort.         ACTIVITIES as tolerated Start light daily activities --- self-care, walking, climbing stairs-- beginning the day after surgery.  Gradually increase activities as tolerated.  Control your pain to be active.  Stop when you are tired.  Ideally, walk several times a day, eventually an hour a day.   Most people are back to most day-to-day activities in a few weeks.  It takes 4-8 weeks to get back to unrestricted, intense activity. If you can walk 30 minutes without difficulty, it is safe to try more intense activity such as jogging, treadmill, bicycling, low-impact aerobics, swimming, etc. Save the most intensive and strenuous activity for last (Usually 4-8 weeks after surgery) such as sit-ups, heavy lifting, contact sports, etc.  Refrain from any intense heavy lifting or straining until you are off narcotics for pain control.  You will have off days, but things should improve week-by-week. DO NOT PUSH THROUGH PAIN.  Let  pain be your guide: If it hurts to do something, don't do it.  Pain is your body warning you to avoid that activity for another week until the pain goes down. You may drive when you are no longer taking narcotic prescription pain medication, you can comfortably wear a seatbelt, and you can safely make sudden turns/stops to protect yourself without hesitating due to pain. You may have sexual intercourse when it is comfortable. If it hurts to do something,  stop.  MEDICATIONS Take your usually prescribed home medications unless otherwise directed.   Blood thinners:  Usually you can restart any strong blood thinners after the second postoperative day.  It is OK to take aspirin right away.     If you are on strong blood thinners (warfarin/Coumadin, Plavix, Xerelto, Eliquis, Pradaxa, etc), discuss with your surgeon, medicine PCP, and/or cardiologist for instructions on when to restart the blood thinner & if blood monitoring is needed (PT/INR blood check, etc).     PAIN CONTROL Pain after surgery or related to activity is often due to strain/injury to muscle, tendon, nerves and/or incisions.  This pain is usually short-term and will improve in a few months.  To help speed the process of healing and to get back to regular activity more quickly, DO THE FOLLOWING THINGS TOGETHER: 1. Increase activity gradually.  DO NOT PUSH THROUGH PAIN 2. Use Ice and/or Heat 3. Try Gentle Massage and/or Stretching 4. Take over the counter pain medication 5. Take Narcotic prescription pain medication for more severe pain  Good pain control = faster recovery.  It is better to take more medicine to be more active than to stay in bed all day to avoid medications. 1.  Increase activity gradually Avoid heavy lifting at first, then increase to lifting as tolerated over the next 6 weeks. Do not "push through" the pain.  Listen to your body and avoid positions and maneuvers than reproduce the pain.  Wait a few days before trying something more intense Walking an hour a day is encouraged to help your body recover faster and more safely.  Start slowly and stop when getting sore.  If you can walk 30 minutes without stopping or pain, you can try more intense activity (running, jogging, aerobics, cycling, swimming, treadmill, sex, sports, weightlifting, etc.) Remember: If it hurts to do it, then don't do it! 2. Use Ice and/or Heat You will have swelling and bruising around the  incisions.  This will take several weeks to resolve. Ice packs or heating pads (6-8 times a day, 30-60 minutes at a time) will help sooth soreness & bruising. Some people prefer to use ice alone, heat alone, or alternate between ice & heat.  Experiment and see what works best for you.  Consider trying ice for the first few days to help decrease swelling and bruising; then, switch to heat to help relax sore spots and speed recovery. Shower every day.  Short baths are fine.  It feels good!  Keep the incisions and wounds clean with soap & water.   3. Try Gentle Massage and/or Stretching Massage at the area of pain many times a day Stop if you feel pain - do not overdo it 4. Take over the counter pain medication This helps the muscle and nerve tissues become less irritable and calm down faster Choose ONE of the following over-the-counter anti-inflammatory medications: Acetaminophen 500mg  tabs (Tylenol) 1-2 pills with every meal and just before bedtime (avoid if you have liver problems or if you  have acetaminophen in you narcotic prescription) Naproxen 220mg  tabs (ex. Aleve, Naprosyn) 1-2 pills twice a day (avoid if you have kidney, stomach, IBD, or bleeding problems) Ibuprofen 200mg  tabs (ex. Advil, Motrin) 3-4 pills with every meal and just before bedtime (avoid if you have kidney, stomach, IBD, or bleeding problems) Take with food/snack several times a day as directed for at least 2 weeks to help keep pain / soreness down & more manageable. 5. Take Narcotic prescription pain medication for more severe pain A prescription for strong pain control is often given to you upon discharge (for example: oxycodone/Percocet, hydrocodone/Norco/Vicodin, or tramadol/Ultram) Take your pain medication as prescribed. Be mindful that most narcotic prescriptions contain Tylenol (acetaminophen) as well - avoid taking too much Tylenol. If you are having problems/concerns with the prescription medicine (does not control  pain, nausea, vomiting, rash, itching, etc.), please call us [phone] to see if we need to switch you to a different pain medicine that will work better for you and/or control your side effects better. If you need a refill on your pain medication, you must call the office before 4 pm and on weekdays only.  By federal law, prescriptions for narcotics cannot be called into a pharmacy.  They must be filled out on paper & picked up from our office by the patient or authorized caretaker.  Prescriptions cannot be filled after 4 pm nor on weekends.    WHEN TO CALL us [phone] Severe uncontrolled or worsening pain  Fever over 101 F (38.5 C) Concerns with the incision: Worsening pain, redness, rash/hives, swelling, bleeding, or drainage Reactions / problems with new medications (itching, rash, hives, nausea, etc.) Nausea and/or vomiting Difficulty urinating Difficulty breathing Worsening fatigue, dizziness, lightheadedness, blurred vision Other concerns If you are not getting better after two weeks or are noticing you are getting worse, contact our office [phone] for further advice.  We may need to adjust your medications, re-evaluate you in the office, send you to the emergency room, or see what other things we can do to help. The clinic staff is available to answer your questions during regular business hours (8:30am-5pm).  Please don't hesitate to call and ask to speak to one of our nurses for clinical concerns.    A surgeon from [facility] is always on call at the hospitals 24 hours/day If you have a medical emergency, go to the nearest emergency room or call 911.  FOLLOW UP in our office One the day of your discharge from the hospital (or the next business weekday), please call [facility] to set up or confirm an appointment to see your surgeon in the office for a follow-up appointment.  Usually it is 2-3 weeks after your surgery.   If you have skin  staples at your incision(s), let the office know so we can set up a time in the office for the nurse to remove them (usually around 10 days after surgery). Make sure that you call for appointments the day of discharge (or the next business weekday) from the hospital to ensure a convenient appointment time. IF YOU HAVE DISABILITY OR FAMILY LEAVE FORMS, BRING THEM TO THE OFFICE FOR PROCESSING.  DO NOT GIVE THEM TO YOUR DOCTOR.  [facility] [address] [place], [place], [place]  [address] ? [phone] - Main [phone] - [place],  [phone] - Fax [email or web address]  GETTING TO GOOD BOWEL HEALTH. It is expected for your digestive tract to  need a few months to get back to normal.  It is common for your bowel movements and stools to be irregular.  You will have occasional bloating and cramping that should eventually fade away.  Until you are eating solid food normally, off all pain medications, and back to regular activities; your bowels will not be normal.   Avoiding constipation The goal: ONE SOFT BOWEL MOVEMENT A DAY!    Drink plenty of fluids.  Choose water first. TAKE A FIBER SUPPLEMENT EVERY DAY THE REST OF YOUR LIFE During your first week back home, gradually add back a fiber supplement every day Experiment which form you can tolerate.   There are many forms such as powders, tablets, wafers, gummies, etc Psyllium bran (Metamucil), methylcellulose (Citrucel), Miralax or Glycolax, Benefiber, Flax Seed.  Adjust the dose week-by-week (1/2 dose/day to 6 doses a day) until you are moving your bowels 1-2 times a day.  Cut back the dose or try a different fiber product if it is giving you problems such as diarrhea or bloating. Sometimes a laxative is needed to help jump-start bowels if constipated until the fiber supplement can help regulate your bowels.  If you are tolerating eating & you are farting, it is okay to try a gentle laxative such as  double dose MiraLax, prune juice, or Milk of Magnesia.  Avoid using laxatives too often. Stool softeners can sometimes help counteract the constipating effects of narcotic pain medicines.  It can also cause diarrhea, so avoid using for too long. If you are still constipated despite taking fiber daily, eating solids, and a few doses of laxatives, call our office. Controlling diarrhea Try drinking liquids and eating bland foods for a few days to avoid stressing your intestines further. Avoid dairy products (especially milk & ice cream) for a short time.  The intestines often can lose the ability to digest lactose when stressed. Avoid foods that cause gassiness or bloating.  Typical foods include beans and other legumes, cabbage, broccoli, and dairy foods.  Avoid greasy, spicy, fast foods.  Every person has some sensitivity to other foods, so listen to your body and avoid those foods that trigger problems for you. Probiotics (such as active yogurt, Align, etc) may help repopulate the intestines and colon with normal bacteria and calm down a sensitive digestive tract Adding a fiber supplement gradually can help thicken stools by absorbing excess fluid and retrain the intestines to act more normally.  Slowly increase the dose over a few weeks.  Too much fiber too soon can backfire and cause cramping & bloating. It is okay to try and slow down diarrhea with a few doses of antidiarrheal medicines.   Bismuth subsalicylate (ex. Kayopectate, Pepto Bismol) for a few doses can help control diarrhea.  Avoid if pregnant.   Loperamide (Imodium) can slow down diarrhea.  Start with one tablet (2mg ) first.  Avoid if you are having fevers or severe pain.  ILEOSTOMY PATIENTS WILL HAVE CHRONIC DIARRHEA since their colon is not in use.    Drink plenty of liquids.  You will need to drink even more glasses of water/liquid a day to avoid getting dehydrated. Record output from your ileostomy.  Expect to empty the bag every 3-4  hours at first.  Most people with a permanent ileostomy empty their bag 4-6 times at the least.   Use antidiarrheal medicine (especially Imodium) several times a day to avoid getting dehydrated.  Start with a dose at bedtime & breakfast.  Adjust up or  down as needed.  Increase antidiarrheal medications as directed to avoid emptying the bag more than 8 times a day (every 3 hours). Work with your wound ostomy nurse to learn care for your ostomy.  See ostomy care instructions. TROUBLESHOOTING IRREGULAR BOWELS 1) Start with a soft & bland diet. No spicy, greasy, or fried foods.  2) Avoid gluten/wheat or dairy products from diet to see if symptoms improve. 3) Miralax 17gm or flax seed mixed in Lasara. water or juice-daily. May use 2-4 times a day as needed. 4) Gas-X, Phazyme, etc. as needed for gas & bloating.  5) Prilosec (omeprazole) over-the-counter as needed 6)  Consider probiotics (Align, Activa, etc) to help calm the bowels down  Call your doctor if you are getting worse or not getting better.  Sometimes further testing (cultures, endoscopy, X-ray studies, CT scans, bloodwork, etc.) may be needed to help diagnose and treat the cause of the diarrhea. [facility], [place], [place], [place], [place]  [address] [phone] - Main.    [phone]  - Toll Free.   [phone] - Fax [email or web address]

## 2021-03-28 NOTE — Discharge Summary (Signed)
Physician Discharge Summary    Patient ID: Chloe Young MRN: [id number] DOB/AGE: [id number]  71 y.o.  Patient Care Team: Binnie Rail, MD as PCP - General (Internal Medicine) Charlton Haws, [facility] as Pharmacist (Pharmacist) [name], MD as Consulting Physician (Gastroenterology) Stark Klein, MD as Consulting Physician (General Surgery) [name], MD as Consulting Physician (Colon and Rectal Surgery) [name], MD as Consulting Physician (Cardiology)  Admit date: 03/23/2021  Discharge date: 03/25/2021 Hospital Stay = 2 days    Discharge Diagnoses:  Principal Problem:   Parastomal hernia s/p robotic colostomy takedown 03/23/2021 Active Problems:   Hyperlipidemia   Essential hypertension   Raynaud's syndrome   NECK PAIN, CHRONIC   Cervical post-laminectomy syndrome   Hypokalemia   Lumbar radiculopathy   GERD (gastroesophageal reflux disease)   Diverticulitis of large intestine with perforation and abscess s/p Hartmann/colostomy  11/08/2020   Hypomagnesemia     03/23/2021  POST-OPERATIVE DIAGNOSIS:  COLOSTOMY FOR COLON RESECTION, DESIRE FOR OSTOMY TAKEDOWN PARASTOMAL HERNIA INCISIONAL HERNIA  PROCEDURE:  ROBOTIC COLOSTOMY TAKEDOWN ROBOTIC RECTOSIGMOID RESECTION & COLOSTOMY EXCISION ROBOTICLYSIS OF ADHESIONS RIGID PROCTOSCOPY PRIMARY PARASTOMAL HERNIA REPAIR  SURGEON: [name], MD  Operative report 03/23/2021  Preoperative diagnosis: Diverticulitis  Postoperative diagnosis: Diverticulitis   Procedure: Cystoscopy with injection of indocyanine green dye into the ureters bilaterally  Surgeon: Pryor Curia MD  Consults: Urology  Hospital Course:   The patient underwent the surgery above.  Postoperatively, the patient gradually mobilized and advanced to a solid diet.  Pain and other symptoms were treated aggressively.    By the time of discharge, the patient was walking well the hallways, eating food,  having flatus.  Pain was well-controlled on an oral medications.  Based on meeting discharge criteria and continuing to recover, I felt it was safe for the patient to be discharged from the hospital to further recover with close followup. Postoperative recommendations were discussed in detail.  They are written as well.  Discharged Condition: good  Discharge Exam: Blood pressure 106/64, pulse 86, temperature 99.4 F (37.4 C), temperature source Oral, resp. rate 16, height 5\' 3"  (1.6 m), weight 58.3 kg, SpO2 99 %.  General: Pt awake/alert/oriented x4 in No acute distress Eyes: PERRL, normal EOM.  Sclera clear.  No icterus Neuro: CN II-XII intact w/o focal sensory/motor deficits. Lymph: No head/neck/groin lymphadenopathy Psych:  No delerium/psychosis/paranoia HENT: Normocephalic, Mucus membranes moist.  No thrush Neck: Supple, No tracheal deviation Chest: No chest wall pain w good excursion CV:  Pulses intact.  Regular rhythm MS: Normal AROM mjr joints.  No obvious deformity Abdomen: Soft.  Nondistended.  Mildly tender at incisions only.  No evidence of peritonitis.  No incarcerated hernias. Ext:  SCDs BLE.  No mjr edema.  No cyanosis Skin: No petechiae / purpura   Disposition:    Follow-up Information    [name], MD. Schedule an appointment as soon as possible for a visit in 3 weeks.   Specialties: General Surgery, Colon and Rectal Surgery Why: To follow up after your operation, To follow up after your hospital stay Contact information: [address] [place] [address] [phone]               Discharge disposition: 01-Home or Self Care       Discharge Instructions    Call MD for:   Complete by: As directed    FEVER > 101.5 F  (temperatures < 101.5 F are not significant)  Call MD for:   Complete by: As directed    FEVER > 101.5 F  (temperatures < 101.5 F are not significant)   Call MD for:  extreme fatigue   Complete by: As directed     Call MD for:  extreme fatigue   Complete by: As directed    Call MD for:  persistant dizziness or light-headedness   Complete by: As directed    Call MD for:  persistant dizziness or light-headedness   Complete by: As directed    Call MD for:  persistant nausea and vomiting   Complete by: As directed    Call MD for:  persistant nausea and vomiting   Complete by: As directed    Call MD for:  redness, tenderness, or signs of infection (pain, swelling, redness, odor or green/yellow discharge around incision site)   Complete by: As directed    Call MD for:  redness, tenderness, or signs of infection (pain, swelling, redness, odor or green/yellow discharge around incision site)   Complete by: As directed    Call MD for:  severe uncontrolled pain   Complete by: As directed    Call MD for:  severe uncontrolled pain   Complete by: As directed    Diet - low sodium heart healthy   Complete by: As directed    Start with a bland diet such as soups, liquids, starchy foods, low fat foods, etc. the first few days at home. Gradually advance to a solid, low-fat, high fiber diet by the end of the first week at home.   Add a fiber supplement to your diet (Metamucil, etc) If you feel full, bloated, or constipated, stay on a full liquid or pureed/blenderized diet for a few days until you feel better and are no longer constipated.   Discharge instructions   Complete by: As directed    See Discharge Instructions If you are not getting better after two weeks or are noticing you are getting worse, contact our office [phone] for further advice.  We may need to adjust your medications, re-evaluate you in the office, send you to the emergency room, or see what other things we can do to help. The clinic staff is available to answer your questions during regular business hours (8:30am-5pm).  Please don't hesitate to call and ask to speak to one of our nurses for clinical concerns.    A surgeon from [facility] is always on call at the hospitals 24 hours/day If you have a medical emergency, go to the nearest emergency room or call 911.   Discharge instructions   Complete by: As directed    See Discharge Instructions If you are not getting better after two weeks or are noticing you are getting worse, contact our office [phone] for further advice.  We may need to adjust your medications, re-evaluate you in the office, send you to the emergency room, or see what other things we can do to help. The clinic staff is available to answer your questions during regular business hours (8:30am-5pm).  Please don't hesitate to call and ask to speak to one of our nurses for clinical concerns.    A surgeon from [facility] is always on call at the hospitals 24 hours/day If you have a medical emergency, go to the nearest emergency room or call 911.   Discharge wound care:   Complete by: As directed    It is good for closed incisions and even open  wounds to be washed every day.  Shower every day.  Short baths are fine.  Wash the incisions and wounds clean with soap & water.    You may leave closed incisions open to air if it is dry.   You may cover the incision with clean gauze & replace it after your daily shower for comfort.  TEGADERM:  You have clear gauze band-aid dressings over your closed incision(s).  Remove the dressings 3 days after surgery.   Discharge wound care:   Complete by: As directed    It is good for closed incisions and even open wounds to be washed every day.  Shower every day.  Short baths are fine.  Wash the incisions and wounds clean with soap & water.    You may leave closed incisions open to air if it is dry.   You may cover the incision with clean gauze & replace it after your daily shower for comfort.  TEGADERM:  You have clear gauze band-aid dressings over your closed incision(s).  Remove the dressings 3 days after surgery.   Driving Restrictions    Complete by: As directed    You may drive when: - you are no longer taking narcotic prescription pain medication - you can comfortably wear a seatbelt - you can safely make sudden turns/stops without pain.   Driving Restrictions   Complete by: As directed    You may drive when: - you are no longer taking narcotic prescription pain medication - you can comfortably wear a seatbelt - you can safely make sudden turns/stops without pain.   Increase activity slowly   Complete by: As directed    Start light daily activities --- self-care, walking, climbing stairs- beginning the day after surgery.  Gradually increase activities as tolerated.  Control your pain to be active.  Stop when you are tired.  Ideally, walk several times a day, eventually an hour a day.   Most people are back to most day-to-day activities in a few weeks.  It takes 4-6 weeks to get back to unrestricted, intense activity. If you can walk 30 minutes without difficulty, it is safe to try more intense activity such as jogging, treadmill, bicycling, low-impact aerobics, swimming, etc. Save the most intensive and strenuous activity for last (Usually 4-8 weeks after surgery) such as sit-ups, heavy lifting, contact sports, etc.  Refrain from any intense heavy lifting or straining until you are off narcotics for pain control.  You will have off days, but things should improve week-by-week. DO NOT PUSH THROUGH PAIN.  Let pain be your guide: If it hurts to do something, don't do it.   Increase activity slowly   Complete by: As directed    Start light daily activities --- self-care, walking, climbing stairs- beginning the day after surgery.  Gradually increase activities as tolerated.  Control your pain to be active.  Stop when you are tired.  Ideally, walk several times a day, eventually an hour a day.   Most people are back to most day-to-day activities in a few weeks.  It takes 4-6 weeks to get back to unrestricted, intense activity. If  you can walk 30 minutes without difficulty, it is safe to try more intense activity such as jogging, treadmill, bicycling, low-impact aerobics, swimming, etc. Save the most intensive and strenuous activity for last (Usually 4-8 weeks after surgery) such as sit-ups, heavy lifting, contact sports, etc.  Refrain from any intense heavy lifting or straining until you are off narcotics for  pain control.  You will have off days, but things should improve week-by-week. DO NOT PUSH THROUGH PAIN.  Let pain be your guide: If it hurts to do something, don't do it.   Lifting restrictions   Complete by: As directed    If you can walk 30 minutes without difficulty, it is safe to try more intense activity such as jogging, treadmill, bicycling, low-impact aerobics, swimming, etc. Save the most intensive and strenuous activity for last (Usually 4-8 weeks after surgery) such as sit-ups, heavy lifting, contact sports, etc.   Refrain from any intense heavy lifting or straining until you are off narcotics for pain control.  You will have off days, but things should improve week-by-week. DO NOT PUSH THROUGH PAIN.  Let pain be your guide: If it hurts to do something, don't do it.  Pain is your body warning you to avoid that activity for another week until the pain goes down.   Lifting restrictions   Complete by: As directed    If you can walk 30 minutes without difficulty, it is safe to try more intense activity such as jogging, treadmill, bicycling, low-impact aerobics, swimming, etc. Save the most intensive and strenuous activity for last (Usually 4-8 weeks after surgery) such as sit-ups, heavy lifting, contact sports, etc.   Refrain from any intense heavy lifting or straining until you are off narcotics for pain control.  You will have off days, but things should improve week-by-week. DO NOT PUSH THROUGH PAIN.  Let pain be your guide: If it hurts to do something, don't do it.  Pain is your body warning you to avoid that  activity for another week until the pain goes down.   May shower / Bathe   Complete by: As directed    May shower / Bathe   Complete by: As directed    May walk up steps   Complete by: As directed    May walk up steps   Complete by: As directed    Remove dressing in 72 hours   Complete by: As directed    Make sure all dressings are removed by the third day after surgery.  Leave incisions open to air.  OK to cover incisions with gauze or bandages as desired   Remove dressing in 72 hours   Complete by: As directed    Make sure all dressings are removed by the third day after surgery = 03/25/2021.   Leave incisions open to air.  OK to cover incisions with gauze or bandages as desired   Sexual Activity Restrictions   Complete by: As directed    You may have sexual intercourse when it is comfortable. If it hurts to do something, stop.   Sexual Activity Restrictions   Complete by: As directed    You may have sexual intercourse when it is comfortable. If it hurts to do something, stop.      Allergies as of 03/25/2021   No Known Allergies     Medication List    TAKE these medications   acetaminophen 500 MG tablet Commonly known as: TYLENOL Take 1 tablet (500 mg total) by mouth every 6 (six) hours as needed for moderate pain.   alendronate 70 MG tablet Commonly known as: FOSAMAX TAKE 1 TABLET BY MOUTH  EVERY 7 DAYS TAKE WITH A  FULL GLASS OF WATER ON AN  EMPTY STOMACH What changed: See the new instructions.   CALCIUM + D PO Take 1 tablet by mouth daily.  fluticasone 50 MCG/ACT nasal spray Commonly known as: FLONASE USE 1 SPRAY IN BOTH  NOSTRILS TWICE DAILY What changed: See the new instructions.   hydrochlorothiazide 12.5 MG capsule Commonly known as: MICROZIDE TAKE 1 CAPSULE BY MOUTH  DAILY What changed: when to take this   Lubricant Drops/Dual-Action 0.5-0.9 % ophthalmic solution Generic drug: carboxymethylcellul-glycerin Place 1 drop into both eyes 3 (three) times daily  as needed for dry eyes.   Lyrica 150 MG capsule Generic drug: pregabalin Take 150 mg by mouth 2 (two) times daily.   metoprolol tartrate 50 MG tablet Commonly known as: LOPRESSOR TAKE ONE-HALF TABLET BY  MOUTH TWICE DAILY   multivitamin with minerals Tabs tablet Take 1 tablet by mouth in the morning.   omeprazole 20 MG capsule Commonly known as: PRILOSEC TAKE 1 CAPSULE BY MOUTH  DAILY What changed:   how much to take  how to take this  when to take this  additional instructions   oxyCODONE 5 MG immediate release tablet Commonly known as: Oxy IR/ROXICODONE Take 1 tablet (5 mg total) by mouth every 6 (six) hours as needed for severe pain.   rosuvastatin 5 MG tablet Commonly known as: CRESTOR TAKE 1 TABLET BY MOUTH  DAILY What changed: when to take this   topiramate 100 MG tablet Commonly known as: TOPAMAX Take 100 mg by mouth 2 (two) times daily.            Discharge Care Instructions  (From admission, onward)         Start     Ordered   03/25/21 0000  Discharge wound care:       Comments: It is good for closed incisions and even open wounds to be washed every day.  Shower every day.  Short baths are fine.  Wash the incisions and wounds clean with soap & water.    You may leave closed incisions open to air if it is dry.   You may cover the incision with clean gauze & replace it after your daily shower for comfort.  TEGADERM:  You have clear gauze band-aid dressings over your closed incision(s).  Remove the dressings 3 days after surgery.   03/25/21 0742   03/23/21 0000  Discharge wound care:       Comments: It is good for closed incisions and even open wounds to be washed every day.  Shower every day.  Short baths are fine.  Wash the incisions and wounds clean with soap & water.    You may leave closed incisions open to air if it is dry.   You may cover the incision with clean gauze & replace it after your daily shower for comfort.  TEGADERM:  You have clear  gauze band-aid dressings over your closed incision(s).  Remove the dressings 3 days after surgery.   03/23/21 1200          Significant Diagnostic Studies:  No results found for this or any previous visit (from the past 72 hour(s)).  No results found.  Past Medical History:  Diagnosis Date  . Cervical facet syndrome   . Cervical post-laminectomy syndrome    Dr Tessa Ler[facility]  . Cervicalgia   . Chronic pain syndrome   . Closed fracture of proximal end of right humerus 03/08/2021  . GERD (gastroesophageal reflux disease)   . Headache    due to neck fusion  . Hyperlipidemia    joint pain with statin  . Hypertension   . Osteoarthritis, localized, knee  OA AND PAIN LEFT KNEE  -- S/P RIGHT  KNEE ARTHROPLASTY ON 82012--KNEE IS DOING WELL.  . Osteopenia 06/2016    T score -1.6 FRAX not calculated  . Perforated abdominal viscus   . Severe sepsis ([place]) 11/08/2020    Past Surgical History:  Procedure Laterality Date  . CERVIAL FUSION  2006   Dr Arnoldo Morale, [place]  . COLECTOMY WITH COLOSTOMY CREATION/HARTMANN PROCEDURE  11/08/2020   Procedure: SIGMOID COLECTOMY WITH COLOSTOMY CREATION/HARTMANN PROCEDURE;  Surgeon: Stark Klein, MD;  Location: [place];  Service: General;;  . COLONOSCOPY  2011    Dr Fuller Plan  . CYSTOSCOPY N/A 03/23/2021   Procedure: CYSTOSCOPY WITH BILATERAL FIREFLY INJECTION;  Surgeon: Raynelle Bring, MD;  Location: [facility];  Service: Urology;  Laterality: N/A;  . ESI  09/03/13   L 5- S1 ; Dr Nelva Bush  . JOINT REPLACEMENT  AUG 2012    RIGHT KNEE ARTHROPLASTY  . KNEE ARTHROSCOPY     bilaterally; Dr Tonita Cong  . LAPAROTOMY N/A 11/08/2020   Procedure: EXPLORATORY LAPAROTOMY;  Surgeon: Stark Klein, MD;  Location: [place];  Service: General;  Laterality: N/A;  . TOTAL KNEE ARTHROPLASTY  01/11/2012   Procedure: TOTAL KNEE ARTHROPLASTY;  Surgeon: Johnn Hai, MD;  Location: [facility];  Service: Orthopedics;  Laterality: Left;  General with Femoral nerve block    Social History    Socioeconomic History  . Marital status: Married    Spouse name: Not on file  . Number of children: 1  . Years of education: Not on file  . Highest education level: Not on file  Occupational History  . Occupation: HAIRDRESSER    Employer: [employer]  Tobacco Use  . Smoking status: Never Smoker  . Smokeless tobacco: Never Used  Vaping Use  . Vaping Use: Never used  Substance and Sexual Activity  . Alcohol use: Yes    Alcohol/week: 14.0 standard drinks    Types: 14 Shots of liquor per week    Comment: 2 DRINKS PER NIGHT  . Drug use: No  . Sexual activity: Yes    Birth control/protection: Post-menopausal  Other Topics Concern  . Not on file  Social History Narrative  . Not on file   Social Determinants of Health   Financial Resource Strain: Low Risk   . Difficulty of Paying Living Expenses: Not hard at all  Food Insecurity: Not on file  Transportation Needs: Not on file  Physical Activity: Not on file  Stress: Not on file  Social Connections: Not on file  Intimate Partner Violence: Not on file    Family History  Problem Relation Age of Onset  . Diverticulitis Mother 11       with fissures.Marland Kitchenopted for no surgery  . Cancer Father 73        malignant brain tumor  . Breast cancer Maternal Aunt   . Diabetes Maternal Grandfather   . Heart failure Maternal Grandfather   . Stroke Maternal Uncle        in 73s  . Heart disease Maternal Uncle        X4 ; 1 had MI @ 57  . Hypertension Sister   . Diverticulitis Sister   . Colon cancer Neg Hx   . Colon polyps Neg Hx   . Esophageal cancer Neg Hx   . Rectal cancer Neg Hx   . Stomach cancer Neg Hx     No current facility-administered medications for this encounter.   Current Outpatient Medications  Medication Sig  Dispense Refill  . acetaminophen (TYLENOL) 500 MG tablet Take 1 tablet (500 mg total) by mouth every 6 (six) hours as needed for moderate pain. 30 tablet 0  . alendronate (FOSAMAX) 70 MG tablet TAKE  1 TABLET BY MOUTH  EVERY 7 DAYS TAKE WITH A  FULL GLASS OF WATER ON AN  EMPTY STOMACH (Patient taking differently: Take 70 mg by mouth every Thursday. TAKE 1 TABLET BY MOUTH  EVERY 7 DAYS TAKE WITH A  FULL GLASS OF WATER ON AN  EMPTY STOMACH) 12 tablet 3  . Calcium Carbonate-Vitamin D (CALCIUM + D PO) Take 1 tablet by mouth daily.    . carboxymethylcellul-glycerin (LUBRICANT DROPS/DUAL-ACTION) 0.5-0.9 % ophthalmic solution Place 1 drop into both eyes 3 (three) times daily as needed for dry eyes.    . fluticasone (FLONASE) 50 MCG/ACT nasal spray USE 1 SPRAY IN BOTH  NOSTRILS TWICE DAILY (Patient taking differently: Place 1 spray into both nostrils 2 (two) times daily as needed for allergies.) 48 g 1  . hydrochlorothiazide (MICROZIDE) 12.5 MG capsule TAKE 1 CAPSULE BY MOUTH  DAILY (Patient taking differently: Take 12.5 mg by mouth in the morning.) 90 capsule 3  . LYRICA 150 MG capsule Take 150 mg by mouth 2 (two) times daily.     . metoprolol tartrate (LOPRESSOR) 50 MG tablet TAKE ONE-HALF TABLET BY  MOUTH TWICE DAILY (Patient taking differently: Take 25 mg by mouth 2 (two) times daily.) 90 tablet 3  . Multiple Vitamin (MULITIVITAMIN WITH MINERALS) TABS Take 1 tablet by mouth in the morning.    Marland Kitchen omeprazole (PRILOSEC) 20 MG capsule TAKE 1 CAPSULE BY MOUTH  DAILY (Patient taking differently: Take 20 mg by mouth in the morning.) 90 capsule 3  . oxyCODONE (OXY IR/ROXICODONE) 5 MG immediate release tablet Take 1 tablet (5 mg total) by mouth every 6 (six) hours as needed for severe pain. 10 tablet 0  . rosuvastatin (CRESTOR) 5 MG tablet TAKE 1 TABLET BY MOUTH  DAILY (Patient taking differently: Take 5 mg by mouth in the morning.) 90 tablet 3  . topiramate (TOPAMAX) 100 MG tablet Take 100 mg by mouth 2 (two) times daily.       No Known Allergies  Signed: Morton Peters, MD, FACS, MASCRS  Gastrointestinal and Minimally Invasive Surgery  [facility] [address]  [place], [place] [address] [phone] Fax [phone] Main/Paging  CONTACT INFORMATION: Weekday (9AM-5PM) concerns: Call [facility] main office at [phone] Weeknight (5PM-9AM) or Weekend/Holiday concerns: Check [email or web address] for General Surgery [facility] coverage (Please, do not use SecureChat as it is not reliable communication to operating surgeons for immediate patient care)      03/28/2021, 8:41 AM

## 2021-03-29 NOTE — Progress Notes (Signed)
Subjective:    Patient ID: Chloe Young, female    DOB: [birth year], 70 y.o.   MRN: [id number]  HPI The patient is here for an acute visit.   Ear popping, clogged -  This is chronic and intermittent.  It started a little while ago and it is somewhat intermittent.  It is the right ear only- it stops up intermittently and she can hear herself breathing.  She denies any pain or true decreased hearing.  Sometimes it comes back with opening her mouth and pushing on her tragus.  She denies other cold symptoms.  She denies any worsening with eating.  She takes nasacort and allegra daily for allergies.    Medications and allergies reviewed with patient and updated if appropriate.  Patient Active Problem List   Diagnosis Date Noted  . Parastomal hernia s/p robotic colostomy takedown 03/23/2021 03/24/2021  . Hypomagnesemia 03/24/2021  . Diverticulitis of large intestine with perforation and abscess s/p Hartmann/colostomy  11/08/2020 03/23/2021  . Chronic nasal congestion 03/10/2020  . Lumbar radiculopathy 06/15/2016  . Cervicogenic headache 06/15/2016  . Osteopenia 06/15/2016  . GERD (gastroesophageal reflux disease) 06/15/2016  . Fasting hyperglycemia 11/25/2013  . Hypokalemia 11/25/2013  . Nonspecific abnormal electrocardiogram (ECG) (EKG) 05/28/2012  . Cervical post-laminectomy syndrome 03/18/2012  . Osteoarthritis of both knees 03/18/2012  . Raynaud's syndrome 10/11/2010  . NECK PAIN, CHRONIC 04/19/2010  . Hyperlipidemia 04/19/2009  . Essential hypertension 04/19/2009    Current Outpatient Medications on File Prior to Visit  Medication Sig Dispense Refill  . acetaminophen (TYLENOL) 500 MG tablet Take 1 tablet (500 mg total) by mouth every 6 (six) hours as needed for moderate pain. 30 tablet 0  . alendronate (FOSAMAX) 70 MG tablet TAKE 1 TABLET BY MOUTH  EVERY 7 DAYS TAKE WITH A  FULL GLASS OF WATER ON AN  EMPTY STOMACH (Patient taking differently: Take 70 mg by mouth every  Thursday. TAKE 1 TABLET BY MOUTH  EVERY 7 DAYS TAKE WITH A  FULL GLASS OF WATER ON AN  EMPTY STOMACH) 12 tablet 3  . Calcium Carbonate-Vitamin D (CALCIUM + D PO) Take 1 tablet by mouth daily.    . carboxymethylcellul-glycerin (LUBRICANT DROPS/DUAL-ACTION) 0.5-0.9 % ophthalmic solution Place 1 drop into both eyes 3 (three) times daily as needed for dry eyes.    . chlorzoxazone (PARAFON) 500 MG tablet Take 500 mg by mouth every 8 (eight) hours as needed.    . fluticasone (FLONASE) 50 MCG/ACT nasal spray USE 1 SPRAY IN BOTH  NOSTRILS TWICE DAILY (Patient taking differently: Place 1 spray into both nostrils 2 (two) times daily as needed for allergies.) 48 g 1  . hydrochlorothiazide (MICROZIDE) 12.5 MG capsule TAKE 1 CAPSULE BY MOUTH  DAILY (Patient taking differently: Take 12.5 mg by mouth in the morning.) 90 capsule 3  . LYRICA 150 MG capsule Take 150 mg by mouth 2 (two) times daily.     . meloxicam (MOBIC) 15 MG tablet Take 0.5-1 tablets by mouth daily.    . methocarbamol (ROBAXIN) 500 MG tablet methocarbamol 500 mg tablet  TAKE ONE TABLET BY MOUTH FOUR TIMES DAILY AS NEEDED    . metoprolol tartrate (LOPRESSOR) 50 MG tablet TAKE ONE-HALF TABLET BY  MOUTH TWICE DAILY (Patient taking differently: Take 25 mg by mouth 2 (two) times daily.) 90 tablet 3  . Multiple Vitamin (MULITIVITAMIN WITH MINERALS) TABS Take 1 tablet by mouth in the morning.    Marland Kitchen omeprazole (PRILOSEC) 20 MG capsule TAKE 1 CAPSULE  BY MOUTH  DAILY (Patient taking differently: Take 20 mg by mouth in the morning.) 90 capsule 3  . oxyCODONE (OXY IR/ROXICODONE) 5 MG immediate release tablet Take 1 tablet (5 mg total) by mouth every 6 (six) hours as needed for severe pain. 10 tablet 0  . rosuvastatin (CRESTOR) 5 MG tablet TAKE 1 TABLET BY MOUTH  DAILY (Patient taking differently: Take 5 mg by mouth in the morning.) 90 tablet 3  . topiramate (TOPAMAX) 100 MG tablet Take 100 mg by mouth 2 (two) times daily.     No current facility-administered  medications on file prior to visit.    Past Medical History:  Diagnosis Date  . Cervical facet syndrome   . Cervical post-laminectomy syndrome    Dr Tessa [facility]  . Cervicalgia   . Chronic pain syndrome   . Closed fracture of proximal end of right humerus 03/08/2021  . GERD (gastroesophageal reflux disease)   . Headache    due to neck fusion  . Hyperlipidemia    joint pain with statin  . Hypertension   . Osteoarthritis, localized, knee    OA AND PAIN LEFT KNEE  -- S/P RIGHT  KNEE ARTHROPLASTY ON 82012--KNEE IS DOING WELL.  . Osteopenia 06/2016    T score -1.6 FRAX not calculated  . Perforated abdominal viscus   . Severe sepsis ([place]) 11/08/2020    Past Surgical History:  Procedure Laterality Date  . CERVIAL FUSION  2006   Dr Arnoldo Morale, [place]  . COLECTOMY WITH COLOSTOMY CREATION/HARTMANN PROCEDURE  11/08/2020   Procedure: SIGMOID COLECTOMY WITH COLOSTOMY CREATION/HARTMANN PROCEDURE;  Surgeon: Stark Klein, MD;  Location: [facility];  Service: General;;  . COLONOSCOPY  2011    Dr Fuller Plan  . CYSTOSCOPY N/A 03/23/2021   Procedure: CYSTOSCOPY WITH BILATERAL FIREFLY INJECTION;  Surgeon: Raynelle Bring, MD;  Location: [facility];  Service: Urology;  Laterality: N/A;  . ESI  09/03/13   L 5- S1 ; Dr Nelva Bush  . JOINT REPLACEMENT  AUG 2012    RIGHT KNEE ARTHROPLASTY  . KNEE ARTHROSCOPY     bilaterally; Dr Tonita Cong  . LAPAROTOMY N/A 11/08/2020   Procedure: EXPLORATORY LAPAROTOMY;  Surgeon: Stark Klein, MD;  Location: [facility];  Service: General;  Laterality: N/A;  . TOTAL KNEE ARTHROPLASTY  01/11/2012   Procedure: TOTAL KNEE ARTHROPLASTY;  Surgeon: Johnn Hai, MD;  Location: [facility];  Service: Orthopedics;  Laterality: Left;  General with Femoral nerve block    Social History   Socioeconomic History  . Marital status: Married    Spouse name: Not on file  . Number of children: 1  . Years of education: Not on file  . Highest education level: Not on file  Occupational History  .  Occupation: HAIRDRESSER    Employer: [employer]  Tobacco Use  . Smoking status: Never Smoker  . Smokeless tobacco: Never Used  Vaping Use  . Vaping Use: Never used  Substance and Sexual Activity  . Alcohol use: Yes    Alcohol/week: 14.0 standard drinks    Types: 14 Shots of liquor per week    Comment: 2 DRINKS PER NIGHT  . Drug use: No  . Sexual activity: Yes    Birth control/protection: Post-menopausal  Other Topics Concern  . Not on file  Social History Narrative  . Not on file   Social Determinants of Health   Financial Resource Strain: Low Risk   . Difficulty of Paying Living Expenses: Not hard at all  Food  Insecurity: Not on file  Transportation Needs: Not on file  Physical Activity: Not on file  Stress: Not on file  Social Connections: Not on file    Family History  Problem Relation Age of Onset  . Diverticulitis Mother 5       with fissures.Marland Kitchenopted for no surgery  . Cancer Father 64        malignant brain tumor  . Breast cancer Maternal Aunt   . Diabetes Maternal Grandfather   . Heart failure Maternal Grandfather   . Stroke Maternal Uncle        in 90s  . Heart disease Maternal Uncle        X4 ; 1 had MI @ 93  . Hypertension Sister   . Diverticulitis Sister   . Colon cancer Neg Hx   . Colon polyps Neg Hx   . Esophageal cancer Neg Hx   . Rectal cancer Neg Hx   . Stomach cancer Neg Hx     Review of Systems  Constitutional: Negative for fever.  HENT: Negative for congestion, ear pain (intermittently clogged), hearing loss, sinus pain and sore throat.   Neurological: Negative for dizziness and headaches.       Objective:   Vitals:   03/30/21 1311  BP: 108/68  Pulse: 80  Temp: 98.1 F (36.7 C)  SpO2: 99%   BP Readings from Last 3 Encounters:  03/30/21 108/68  03/25/21 106/64  03/17/21 135/87   Wt Readings from Last 3 Encounters:  03/30/21 126 lb 3.2 oz (57.2 kg)  03/25/21 128 lb 8.5 oz (58.3 kg)  03/17/21 128 lb (58.1 kg)    Body mass index is 22.36 kg/m.   Physical Exam Constitutional:      General: She is not in acute distress.    Appearance: Normal appearance. She is not ill-appearing.  HENT:     Head: Normocephalic and atraumatic.     Right Ear: Tympanic membrane, ear canal and external ear normal.     Left Ear: Tympanic membrane, ear canal and external ear normal.     Ears:     Comments: No wax in either ear canal    Mouth/Throat:     Mouth: Mucous membranes are moist.     Pharynx: No oropharyngeal exudate or posterior oropharyngeal erythema.  Eyes:     Conjunctiva/sclera: Conjunctivae normal.  Musculoskeletal:     Cervical back: No tenderness.  Lymphadenopathy:     Cervical: No cervical adenopathy.  Skin:    General: Skin is warm and dry.  Neurological:     Mental Status: She is alert.            Assessment & Plan:    See Problem List for Assessment and Plan of chronic medical problems.    This visit occurred during the SARS-CoV-2 public health emergency.  Safety protocols were in place, including screening questions prior to the visit, additional usage of staff PPE, and extensive cleaning of exam room while observing appropriate contact time as indicated for disinfecting solutions.

## 2021-03-29 NOTE — Patient Instructions (Addendum)
Please schedule a physical exam when you check out.     You have eustachian tube in your right ear is not functioning properly which is causing your symptoms.     Eustachian Tube Dysfunction  Eustachian tube dysfunction refers to a condition in which a blockage develops in the narrow passage that connects the middle ear to the back of the nose (eustachian tube). The eustachian tube regulates air pressure in the middle ear by letting air move between the ear and nose. It also helps to drain fluid from the middle ear space. Eustachian tube dysfunction can affect one or both ears. When the eustachian tube does not function properly, air pressure, fluid, or both can build up in the middle ear. What are the causes? This condition occurs when the eustachian tube becomes blocked or cannot open normally. Common causes of this condition include:  Ear infections.  Colds and other infections that affect the nose, mouth, and throat (upper respiratory tract).  Allergies.  Irritation from cigarette smoke.  Irritation from stomach acid coming up into the esophagus (gastroesophageal reflux). The esophagus is the tube that carries food from the mouth to the stomach.  Sudden changes in air pressure, such as from descending in an airplane or scuba diving.  Abnormal growths in the nose or throat, such as: ? Growths that line the nose (nasal polyps). ? Abnormal growth of cells (tumors). ? Enlarged tissue at the back of the throat (adenoids). What increases the risk? You are more likely to develop this condition if:  You smoke.  You are overweight.  You are a child who has: ? Certain birth defects of the mouth, such as cleft palate. ? Large tonsils or adenoids. What are the signs or symptoms? Common symptoms of this condition include:  A feeling of fullness in the ear.  Ear pain.  Clicking or popping noises in the ear.  Ringing in the ear.  Hearing loss.  Loss of  balance.  Dizziness. Symptoms may get worse when the air pressure around you changes, such as when you travel to an area of high elevation, fly on an airplane, or go scuba diving. How is this diagnosed? This condition may be diagnosed based on:  Your symptoms.  A physical exam of your ears, nose, and throat.  Tests, such as those that measure: ? The movement of your eardrum (tympanogram). ? Your hearing (audiometry). How is this treated? Treatment depends on the cause and severity of your condition.  In mild cases, you may relieve your symptoms by moving air into your ears. This is called "popping the ears."  In more severe cases, or if you have symptoms of fluid in your ears, treatment may include: ? Medicines to relieve congestion (decongestants). ? Medicines that treat allergies (antihistamines). ? Nasal sprays or ear drops that contain medicines that reduce swelling (steroids). ? A procedure to drain the fluid in your eardrum (myringotomy). In this procedure, a small tube is placed in the eardrum to:  Drain the fluid.  Restore the air in the middle ear space. ? A procedure to insert a balloon device through the nose to inflate the opening of the eustachian tube (balloon dilation). Follow these instructions at home: Lifestyle  Do not do any of the following until your health care provider approves: ? Travel to high altitudes. ? Fly in airplanes. ? Work in a Pension scheme manager or room. ? Scuba dive.  Do not use any products that contain nicotine or tobacco, such as cigarettes  and e-cigarettes. If you need help quitting, ask your health care provider.  Keep your ears dry. Wear fitted earplugs during showering and bathing. Dry your ears completely after. General instructions  Take over-the-counter and prescription medicines only as told by your health care provider.  Use techniques to help pop your ears as recommended by your health care provider. These may  include: ? Chewing gum. ? Yawning. ? Frequent, forceful swallowing. ? Closing your mouth, holding your nose closed, and gently blowing as if you are trying to blow air out of your nose.  Keep all follow-up visits as told by your health care provider. This is important. Contact a health care provider if:  Your symptoms do not go away after treatment.  Your symptoms come back after treatment.  You are unable to pop your ears.  You have: ? A fever. ? Pain in your ear. ? Pain in your head or neck. ? Fluid draining from your ear.  Your hearing suddenly changes.  You become very dizzy.  You lose your balance. Summary  Eustachian tube dysfunction refers to a condition in which a blockage develops in the eustachian tube.  It can be caused by ear infections, allergies, inhaled irritants, or abnormal growths in the nose or throat.  Symptoms include ear pain, hearing loss, or ringing in the ears.  Mild cases are treated with maneuvers to unblock the ears, such as yawning or ear popping.  Severe cases are treated with medicines. Surgery may also be done (rare). This information is not intended to replace advice given to you by your health care provider. Make sure you discuss any questions you have with your health care provider. Document Revised: 04/02/2018 Document Reviewed: 04/02/2018 Elsevier Patient Education  [place].

## 2021-03-30 ENCOUNTER — Other Ambulatory Visit: Payer: Self-pay

## 2021-03-30 ENCOUNTER — Ambulatory Visit (INDEPENDENT_AMBULATORY_CARE_PROVIDER_SITE_OTHER): Payer: Medicare Other | Admitting: Internal Medicine

## 2021-03-30 ENCOUNTER — Encounter: Payer: Self-pay | Admitting: Internal Medicine

## 2021-03-30 DIAGNOSIS — H6991 Unspecified Eustachian tube disorder, right ear: Secondary | ICD-10-CM | POA: Insufficient documentation

## 2021-03-30 DIAGNOSIS — H6981 Other specified disorders of Eustachian tube, right ear: Secondary | ICD-10-CM

## 2021-03-30 NOTE — Assessment & Plan Note (Signed)
Acute Symptoms and exam consistent with right eustachian tube dysfunction Discussed etiology Discussed treatment-continue Allegra and steroid nasal sprays Discussed trying to naturally pop her ear with a couple different techniques Can refer to ENT, but advised that they most likely will not do anything further for her

## 2021-04-19 ENCOUNTER — Telehealth: Payer: Self-pay | Admitting: Pharmacist

## 2021-04-19 NOTE — Progress Notes (Signed)
Chronic Care Management Pharmacy Assistant   Name: Chloe Young  MRN: [id number] DOB: [birth year]   Reason for Encounter: General Adherence Call    Recent office visits:  03/30/21 Dr. Quay Burow, no med changes  Recent consult visits:  None ID  Hospital visits:  Medication Reconciliation was completed by comparing discharge summary, patient's EMR and Pharmacy list, and upon discussion with patient.  Admitted to the hospital on 03/23/21 due to Parastomal hernia. Discharge date was 03/25/21. Discharged from [place]?Medications Started at [facility] Discharge:?? -started None ID  Medication Changes at Hospital Discharge: -Changed None ID  Medications Discontinued at Hospital Discharge: -Stopped None ID  Medications that remain the same after Hospital Discharge:??  -All other medications will remain the same.    Medications: Outpatient Encounter Medications as of 04/19/2021  Medication Sig  . acetaminophen (TYLENOL) 500 MG tablet Take 1 tablet (500 mg total) by mouth every 6 (six) hours as needed for moderate pain.  Marland Kitchen alendronate (FOSAMAX) 70 MG tablet TAKE 1 TABLET BY MOUTH  EVERY 7 DAYS TAKE WITH A  FULL GLASS OF WATER ON AN  EMPTY STOMACH (Patient taking differently: Take 70 mg by mouth every Thursday. TAKE 1 TABLET BY MOUTH  EVERY 7 DAYS TAKE WITH A  FULL GLASS OF WATER ON AN  EMPTY STOMACH)  . Calcium Carbonate-Vitamin D (CALCIUM + D PO) Take 1 tablet by mouth daily.  . carboxymethylcellul-glycerin (LUBRICANT DROPS/DUAL-ACTION) 0.5-0.9 % ophthalmic solution Place 1 drop into both eyes 3 (three) times daily as needed for dry eyes.  . chlorzoxazone (PARAFON) 500 MG tablet Take 500 mg by mouth every 8 (eight) hours as needed.  . fluticasone (FLONASE) 50 MCG/ACT nasal spray USE 1 SPRAY IN BOTH  NOSTRILS TWICE DAILY (Patient taking differently: Place 1 spray into both nostrils 2 (two) times daily as needed for allergies.)  . hydrochlorothiazide (MICROZIDE)  12.5 MG capsule TAKE 1 CAPSULE BY MOUTH  DAILY (Patient taking differently: Take 12.5 mg by mouth in the morning.)  . LYRICA 150 MG capsule Take 150 mg by mouth 2 (two) times daily.   . meloxicam (MOBIC) 15 MG tablet Take 0.5-1 tablets by mouth daily.  . methocarbamol (ROBAXIN) 500 MG tablet methocarbamol 500 mg tablet  TAKE ONE TABLET BY MOUTH FOUR TIMES DAILY AS NEEDED  . metoprolol tartrate (LOPRESSOR) 50 MG tablet TAKE ONE-HALF TABLET BY  MOUTH TWICE DAILY (Patient taking differently: Take 25 mg by mouth 2 (two) times daily.)  . Multiple Vitamin (MULITIVITAMIN WITH MINERALS) TABS Take 1 tablet by mouth in the morning.  Marland Kitchen omeprazole (PRILOSEC) 20 MG capsule TAKE 1 CAPSULE BY MOUTH  DAILY (Patient taking differently: Take 20 mg by mouth in the morning.)  . oxyCODONE (OXY IR/ROXICODONE) 5 MG immediate release tablet Take 1 tablet (5 mg total) by mouth every 6 (six) hours as needed for severe pain.  . rosuvastatin (CRESTOR) 5 MG tablet TAKE 1 TABLET BY MOUTH  DAILY (Patient taking differently: Take 5 mg by mouth in the morning.)  . topiramate (TOPAMAX) 100 MG tablet Take 100 mg by mouth 2 (two) times daily.   No facility-administered encounter medications on file as of 04/19/2021.   Have you had any problems recently with your health? Patient states that she does not have any new health issues at this time  Have you had any problems with your pharmacy? Patient states that she does not have any problems with getting  medications or the cost of  medications from the pharmacy  What issues or side effects are you having with your medications? Patient states she does not have any side effects from medications  What would you like me to pass along to [facility] for them to help you with?  Patient states that she does not have any concerns about her health or medications at this time  What can we do to take care of you better? Patient states if any changes in her health she will call  Dr. Quay Burow office  Star Rating Drugs: Rosuvastatin 02/21/21 90 ds  [place] Pharmacist Assistant [phone]  Time spent:20

## 2021-05-24 DIAGNOSIS — Z1231 Encounter for screening mammogram for malignant neoplasm of breast: Secondary | ICD-10-CM | POA: Diagnosis not present

## 2021-05-24 LAB — HM MAMMOGRAPHY

## 2021-05-25 ENCOUNTER — Telehealth: Payer: Self-pay | Admitting: Pharmacist

## 2021-05-25 NOTE — Chronic Care Management (AMB) (Signed)
Chronic Care Management Pharmacy Assistant   Name: Chloe Young  MRN: [id number] DOB: 07-05-1951   Reason for Encounter: Disease State General Call    Recent office visits:  None ID  Recent consult visits:  None ID  Hospital visits:  Medication Reconciliation was completed by comparing discharge summary, patient's EMR and Pharmacy list, and upon discussion with patient.  Admitted to the hospital on 03/23/21 due to parastomal hernia. Discharge date was 03/25/21. Discharged from [place]?Medications Started at [facility] Discharge:?? -started None ID  Medication Changes at Hospital Discharge: -Changed None ID  Medications Discontinued at Hospital Discharge: -Stopped None ID  Medications that remain the same after Hospital Discharge:??  -All other medications will remain the same.    Medications: Outpatient Encounter Medications as of 05/25/2021  Medication Sig  . acetaminophen (TYLENOL) 500 MG tablet Take 1 tablet (500 mg total) by mouth every 6 (six) hours as needed for moderate pain.  Marland Kitchen alendronate (FOSAMAX) 70 MG tablet TAKE 1 TABLET BY MOUTH  EVERY 7 DAYS TAKE WITH A  FULL GLASS OF WATER ON AN  EMPTY STOMACH (Patient taking differently: Take 70 mg by mouth every Thursday. TAKE 1 TABLET BY MOUTH  EVERY 7 DAYS TAKE WITH A  FULL GLASS OF WATER ON AN  EMPTY STOMACH)  . Calcium Carbonate-Vitamin D (CALCIUM + D PO) Take 1 tablet by mouth daily.  . carboxymethylcellul-glycerin (LUBRICANT DROPS/DUAL-ACTION) 0.5-0.9 % ophthalmic solution Place 1 drop into both eyes 3 (three) times daily as needed for dry eyes.  . chlorzoxazone (PARAFON) 500 MG tablet Take 500 mg by mouth every 8 (eight) hours as needed.  . fluticasone (FLONASE) 50 MCG/ACT nasal spray USE 1 SPRAY IN BOTH  NOSTRILS TWICE DAILY (Patient taking differently: Place 1 spray into both nostrils 2 (two) times daily as needed for allergies.)  . hydrochlorothiazide (MICROZIDE) 12.5 MG capsule TAKE 1  CAPSULE BY MOUTH  DAILY (Patient taking differently: Take 12.5 mg by mouth in the morning.)  . LYRICA 150 MG capsule Take 150 mg by mouth 2 (two) times daily.   . meloxicam (MOBIC) 15 MG tablet Take 0.5-1 tablets by mouth daily.  . methocarbamol (ROBAXIN) 500 MG tablet methocarbamol 500 mg tablet  TAKE ONE TABLET BY MOUTH FOUR TIMES DAILY AS NEEDED  . metoprolol tartrate (LOPRESSOR) 50 MG tablet TAKE ONE-HALF TABLET BY  MOUTH TWICE DAILY (Patient taking differently: Take 25 mg by mouth 2 (two) times daily.)  . Multiple Vitamin (MULITIVITAMIN WITH MINERALS) TABS Take 1 tablet by mouth in the morning.  Marland Kitchen omeprazole (PRILOSEC) 20 MG capsule TAKE 1 CAPSULE BY MOUTH  DAILY (Patient taking differently: Take 20 mg by mouth in the morning.)  . oxyCODONE (OXY IR/ROXICODONE) 5 MG immediate release tablet Take 1 tablet (5 mg total) by mouth every 6 (six) hours as needed for severe pain.  . rosuvastatin (CRESTOR) 5 MG tablet TAKE 1 TABLET BY MOUTH  DAILY (Patient taking differently: Take 5 mg by mouth in the morning.)  . topiramate (TOPAMAX) 100 MG tablet Take 100 mg by mouth 2 (two) times daily.   No facility-administered encounter medications on file as of 05/25/2021.   Pharmacist Review  Have you had any problems recently with your health? Patient states that she does not have any new health issues  Have you had any problems with your pharmacy? Patient states that she does not have any problems with getting or the cost of medications from the pharmacy  What issues or side effects are you having with your medications? Patient states that she does not have any side effects from medications  What would you like me to pass along to [facility] for them to help you with?  Patient states that she is doing well and does not have any concerns about her health or medications at this time  What can we do to take care of you better? Patient states that if any changes she will contact Dr. Quay Burow  office   Star Rating Drugs: Rosuvastatin 02/21/21 90 ds  [place] Pharmacist Assistant [phone]  Time spent:20

## 2021-05-30 ENCOUNTER — Ambulatory Visit: Payer: Self-pay | Admitting: Surgery

## 2021-05-30 NOTE — H&P (Signed)
Chloe Young Appointment: 05/30/2021 10:45 AM Location: [facility] Patient #: [id number] DOB: 1951-08-11 Married / Language: [name] / Race: White Female  History of Present Illness [name] MD; 05/30/2021 1:15 PM) The patient is a 70 year old female who presents with colonic obstruction. Note for "Colon obstruction": ` ` ` The patient returns s/p robotic lyse adhesions with colostomy takedown a rectosigmoid resection. Primary parastomal hernia repair 03/23/2021    Patient returns for follow-up on her chronic low midline incisional wound. Patient notes that she feels like it is getting larger. Places a Telfa pad gauze over it. Not draining as much as it used to but it still gets irritated. He is having increasing bulging. She is otherwise eating well. No fevers or chills. Does not smoke. Does have some discomfort with the bulging. Concerning. No problems at her old colostomy site. Feels like something needs to happen.   PRIOR NOTE:  The patient returns to clinic after surgery, gradually improving. Pain from the incisions is fading away. She still has her midline superficial wound. That turned out to be just subcutaneous tissue and not small bowel was initially feared. She is very thin down diastases in the region. She was having some diarrhea on Benefiber twice a day and is backed off. One-A-Day seems to be working better for her.  Appetite is good. Energy level coming back. Denies nausea, constipation/diarrhea, worsening fatigue, high fevers, or other concerns. ` ` ` The patient is a patient was found to have free air in her cart emergency surgery November 08 2020. 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 She came in some shock. Was in the hospital for 2 weeks. Had prolonged ileus and IV nutrition but eventually improved. She was left with an open wound that is taken time to close  down. Home health stop seeing her so she's been continuing changing the dressing twice a day. Doing little bit of saline on it. Notices minimal yellow on the gauze. She is back to walking her dog a mile and a half a day. She is eating normal food. Drinks a supplemental Ensure shake. Energy level is improving. She did lose about 15 pounds around the time of emergency surgery. She empties a colostomy in the morning. Sometimes feel a time. She does get pain and discomfort below the ostomy. Seems related which she has a hard bowel movement. However does not stop her from walking. She is not on a fiber supplement. He does not smoke. Husband is a smoker. Apparently I operated on him as well. She initially came in with some concern for some ST changes and weakness when she went to the emergency room. She was seen by cardiology. No evidence of MI. Sinus tachycardia related to her peritonitis. Had an echocardiogram 9 years ago that showed good function. Did not have any post-Hartmann cardiac issues.  ###########################################  Pathology: SURGICAL PATHOLOGY CASE: [id number] PATIENT: [name] Surgical Pathology Report     Clinical History: Colostomy for colon resection, desire for ostomy takedown (crm)     FINAL MICROSCOPIC DIAGNOSIS:  A. END COLOSTOMY: - Colostomy. - No evidence of malignancy.  B. FINAL DISTAL MARGIN: - Benign colon. - No evidence of malignancy.  C. COLON, RECTOSIGMOID, RESECTION: - Ulceration with granulation tissue and abscess. - No evidence of malignancy.  Draeden Kellman DESCRIPTION: A: The specimen is received fresh and consists of a portion of bowel, measuring 4.5 cm in length, with 2 open resection  margins. 1 end displays a thin rim of tan-pink skin. Mucosa is tan with normal folding, and the wall averages 0.2 cm in thickness. Representative sections are submitted in 1 cassette.  B: The specimen is received fresh and  consists of a circular portion of tan-red soft tissue, measuring 1.9 x 1.9 x 1.5 cm. Representative sections are submitted in 1 cassette.  C: The specimen is received fresh and consists of a small segment of bowel with surrounding adipose tissue, measuring 4.0 cm in length. The specimen displays one stapled resection margin, and opposing end is grossly consistent with a blind ended pouch. The blind end of the specimen displays embedded staples and blue suture material. No mucosal lesions are grossly identified. Representative sections are submitted in 3 cassettes. 1 = proximal stapled margin 2 and 3 = distal pouch Craig Staggers 03/23/2021)  Final Diagnosis performed by Claudette Laws, MD. Electronically signed 03/24/2021 Technical component performed at [facility], [place] [address] [place], [place] [address]. Professional component performed at [employer]. [facility], [place] [address], [place], [place] [address]. Immunohistochemistry Technical component (if applicable) was performed at [facility], [place]. [address] [place], [place], [place] [address]. IMMUNOHISTOCHEMISTRY DISCLAIMER (if applicable): Some of these immunohistochemical stains may have been developed and the performance characteristics determine by [facility]. Some may not have been cleared or approved by the U.S. Food and Drug Administration. The FDA has determined that such clearance or approval is not necessary. This test is used for clinical purposes. It should not be regarded as investigational or for research. This laboratory is certified under the [place] (CLIA-88) as qualified to perform high complexity clinical laboratory testing. The controls stained appropriately. ` ###########################################`   03/23/2021  12:00 PM  PATIENT: Chloe Young 70 y.o. female  Patient Care Team: Binnie Rail, MD as PCP - General (Internal Medicine) Charlton Haws, [facility] as Pharmacist (Pharmacist) [name], MD as Consulting Physician (Gastroenterology) Stark Klein, MD as Consulting Physician (General Surgery) [name], MD as Consulting Physician (Colon and Rectal Surgery) [name], MD as Consulting Physician (Cardiology)  PRE-OPERATIVE DIAGNOSIS:  COLOSTOMY FOR COLON RESECTION, DESIRE FOR OSTOMY TAKEDOWN PARASTOMAL HERNIA INCISIONAL HERNIA  POST-OPERATIVE DIAGNOSIS:  COLOSTOMY FOR COLON RESECTION, DESIRE FOR OSTOMY TAKEDOWN PARASTOMAL HERNIA INCISIONAL HERNIA  PROCEDURE: ROBOTIC COLOSTOMY TAKEDOWN ROBOTIC RECTOSIGMOID RESECTION & COLOSTOMY EXCISION ROBOTIC LYSIS OF ADHESIONS RIGID PROCTOSCOPY PRIMARY PARASTOMAL HERNIA REPAIR  SURGEON: [name], MD  ASSISTANT: Leighton Ruff, MD, FACS. An experienced assistant was required given the standard of surgical care given the complexity of the case. This assistant was needed for exposure, dissection, suction, tissue approximation, retraction, perception, etc.  ANESTHESIA:   General  Nerve block provided with liposomal bupivacaine (Experel) mixed with 0.25% bupivacaine as a Bilateral TAP block x 1mL each side at the level of the transverse abdominis & preperitoneal spaces along the flank at the anterior axillary line, from subcostal ridge to iliac crest under laparoscopic guidance  Local field block at port sites & extraction wound  EBL: Total I/O In: 2600 [I.V.:2000; IV Piggyback:600] Out: 600 [Urine:500; Blood:100]  Delay start of Pharmacological VTE agent (>24hrs) due to surgical blood loss or risk of bleeding: no  DRAINS: 19 Fr Blake drain goes to the pelvis  SPECIMEN:  RECTOSIGMOID COLON STUMP (open end proximal) COLOSTOMY DISTAL ANASTOMOTIC RING (final distal margin)  DISPOSITION OF SPECIMEN: PATHOLOGY  COUNTS: YES  PLAN OF CARE: Admit to inpatient  PATIENT  DISPOSITION: PACU - hemodynamically stable.  INDICATION:   With colon perforation and shock requiring emergent open Hartmann resection with colostomy. Prolonged hospital stay. Eventually recovery. Rehabilitation. She is now 5 months out from surgery and desired colostomy takedown. She is a parastomal hernia around her colostomy with occasional discomfort. Some partial obstructive-like symptoms as well. I offered robotic possible open lyse each incision colostomy takedown.  The anatomy & physiology of the digestive tract was discussed. The pathophysiology was discussed. Possibility of remaining with an ostomy permanently was discussed. I offered ostomy takedown. Laparoscopic & open techniques were discussed.  Risks such as bleeding, infection, abscess, leak, reoperation, possible re-ostomy, injury to other organs, need for repair of tissues / organs, need for further treatment, hernia, heart attack, death, and other risks were discussed. I noted a good likelihood this will help address the problem. Goals of post-operative recovery were discussed as well. We will work to minimize complications. Questions were answered. The patient expresses understanding & wishes to proceed with surgery.  OR FINDINGS:  Patient had very dense adhesions of omentum to anterior abdominal wall. Infraumbilical stretched out diastases suspicious for hernia. No small bowel to the anterior abdominal wall. Infraumbilical midline wound granulated and nearly closed.  Moderate parastomal hernia around left lower quadrant colostomy. Resection done of distal colostomy. Some distal sigmoid present. Resection done to proximal rectum. The anastomosis rests 9-10 cm from the anal verge by rigid proctoscopy.  Dense interloop small bowel adhesions with numerous internal hernias and twisting and torsion suspicious for some chronic closed-loop and partial small bowel obstructions. Lysed lesions done and released and  improved.  CASE DATA:  Type of patient?: Elective [facility] Private Case  Status of Case? Elective Scheduled  Infection Present At Time Of Surgery (PATOS)? NO  DESCRIPTION:  Informed consent was confirmed. The patient underwent general anaesthesia without difficulty. The patient was positioned appropriately. VTE prevention in place. Dr. Alinda Money was able to do cystoscopy with firefly injections of both ureters for intraoperative identification. Please see his separate note for details. Patient was redraped and prepped. Colostomy had been sutured with a pursestring. The patient was clipped, prepped, & draped in a sterile fashion. Surgical timeout confirmed our plan.  The patient was positioned in reverse Trendelenburg. Abdominal entry was gained using Varess technique at the left subcostal ridge on the anterior abdominal wall. No elevated EtCO2 noted. Patient had episode of hypotension and bradycardia. Port placed. [employer] peritoneum evacuated. Anesthesia was able to give atropine and corrected monitor. Heart rate and blood pressure recovered well. Stable for 5 minutes. Cleared by anesthesia to proceed with surgery. Reinsufflation done without incident. No hemodynamic nor cardiac dysrhythmias for the rest of the case. Camera inspection revealed small scratch on liver without the Varess needle in it. Soft clotted. Varess needle removed. Extra ports were carefully placed under direct laparoscopic visualization.  Upon entering the abdomen (organ space), I encountered . I reflected the greater omentum and the upper abdomen the small bowel in the upper abdomen. The patient was carefully positioned. The Intuitive daVinci robot was docked with camera & instruments carefully placed.  The patient had very dense adhesions but no major abscess. Proceeded with robotic lyse adhesions to free the greater omentum off the anterior abdominal wall. There was no small bowel adherent to the  anterior abdominal wall hard arguing against the granulation tissue of the infraumbilical wound being small bowel serosa. Eventually freed those off. Then proceeded to free off moderate small bowel adhesions to the colostomy  retroperitoneum and pelvis. I freed off some interloop adhesions as well where bowel had been twisted upon itself and one segment somewhat trapped in a closed-loop situation without any ischemia.  Mobilize adhesions around the end colostomy going down the left lower quadrant and moderate parastomal hernia. There was no incarcerated material in the hernia itself. Mobilized the descending colon in a lateral medial fashion as well. Then focused down the pelvis. Could see rectosigmoid stump densely adherent to the uterus. Carefully freed that off and untwisted it. I put the main pedicle on tension. I scored the base of peritoneum of the medial side of the mesentery of the elevated left colon from the ligament of Treitz to the mid rectum. I elevated the sigmoid mesentery and entered into the retro-mesenteric plane. We were able to identify the left ureter and gonadal vessels. We kept those posterior within the retroperitoneum and elevated the left colon mesentery off that. I did isolate the inferior mesenteric artery (IMA) pedicle but did not ligate it yet. I continued distally and got into the avascular plane posterior to the mesorectum, sparing the nervi ergentes.. This allowed me to help mobilize the rectum as well by freeing the mesorectum off the sacrum. I stayed away from the right and left ureters. I kept the lateral vascular pedicles to the rectum intact. Transected through the thickened visceral peritoneum in the pelvis to straighten out the rectosigmoid colon.  I chose a region of the proximal rectum that was distal to an obvious rectosigmoid diverticulum. I transected through the mesorectum and skeletonized around the region. I came back more proximally and transected  through the superior hemorrhoidal pedicle close to the specimen side of the rectosigmoid. I then proceeded to run the small bowel to assure no evidence of injury. I started in the proximal ileum and ran more distally. I freed off numerous interloop adhesions and twisted bowel until it came to the ileal cecal region. I then began to run the small bowel more proximally, gradually freeing off interloop adhesions and untwisted the bowel until I came to the ligament of Treitz. We ran and reinspected more time to confirm no serosal injury or other abnormality. Hemostasis was good. Freed adhesions of the greater omentum off the retroperitoneum and small bowel and left colon. It was somewhat thinned out but mostly intact.  We undocked the robot. I made a circular incision around the squama dermal junction of the colostomy and came through the dermis. Came into the hernia sac of the parastomal hernia around the colostomy and freed the colon from its adhesions to the anterior abdominal wall. Eviscerated and had plenty of redundant colon. Placed a wound protector. Came about 6 cm more proximally where the colon was not inflamed or irritated. Created a window and transected the mesentery more distally. I clamped the colon to this area using a reusable pursestringer device. Passed a 2-0 Keith needle. I transected at the descending/sigmoid junction with a scalpel. I got healthy bleeding mucosa. We sent the rectosigmoid colon specimen off to go to pathology. We sized the colon orifice. I chose a 53mm EEA anvil stapler system. I reinforced the prolene pursestring with interrupted silk suture.  We returned to the end colostomy with the anvil in the back into the peritoneal cavity and placed. We redocked the robot. I transected across the proximal rectum using a robotic 60 mm green load stapler with 1 firing to good result. Specimen removed. The distal end of the remaining colon easily reached down to the  rectal stump, therefore, splenic flexure mobilization was not needed. Dr Marcello Moores scrubbed down and did gentle anal dilation and advanced the EEA stapler up the rectal stump. The spike was brought out at the provimal end of the rectal stump under direct visualization. I attached the anvil of the proximal colon the spike of the stapler. Anvil was tightened down and held clamped for 60 seconds. The EEA stapler was fired and held clamped for 30 seconds. The stapler was released & removed. We noted 2 excellent anastomotic rings. Blue stitch is in the proximal ring. Dr Marcello Moores did rigid proctoscopy noted the anastomosis was at 9-10 cm from the anal verge consistent with the proximal rectum. We irrigation of isotonic solution & held that for the pelvic air leak test . The rectum was insufflated the rectum while clamping the colon proximal to that anastomosis. There was a negative air leak test. There was no tension of mesentery or bowel at the anastomosis. Tissues looked viable. Ureters & bowel uninjured. Did not encounter any major bleeding but she was somewhat oozy all around. Placed a drain down the pelvis and secured to the skin with 2-0 Prolene suture. The anastomosis looked healthy. Greater omentum positioned down into the pelvis to help protect the anastomosis.  Endoluminal gas was evacuated. Ports & wound protector removed. We changed gloves & redraped the patient per colon SSI prevention protocol. We aspirated the antibiotic irrigation. Hemostasis was good. Sterile unused instruments were used from this point. I closed the skin at the port sites using Monocryl stitch and sterile dressing. I closed the parastomal hernia fascia vertically using running #1 PDS suture. Mobilized some skin facts and transected some redundant fat in dogears. I closed the colostomy wound using interrupted 2-0 Vicryl deep subcu and deep dermal sutures. I placed antibiotic-soaked wicks into the closure at the corners  x2. I placed sterile dressings.   Patient is being extubated go to recovery room. I had discussed postop care with the patient in detail the office & in the holding area. Instructions are written. I discussed operative findings, updated the patient's status, discussed probable steps to recovery, and gave postoperative recommendations to the patient's son, Randall Hiss. I then called & d/w her husband as well. Recommendations were made. Questions were answered. They expressed understanding & appreciation.  [name], M.D., F.A.C.S. Gastrointestinal and Minimally Invasive Surgery [facility] [place], [place] [address] [phone] Main / Paging   Problem List/Past Medical [name], MD; 05/30/2021 11:10 AM) S/P EXPLORATORY LAPAROTOMY (Z98.890) COLOSTOMY CARE (Z43.3) PERFORATION OF SIGMOID COLON DUE TO DIVERTICULITIS (K57.20) PARASTOMAL HERNIA (K43.5) COLOSTOMY IN PLACE (Z93.3) PREOP COLON - ENCOUNTER FOR PREOPERATIVE EXAMINATION FOR GENERAL SURGICAL PROCEDURE (Z01.818) WOUND, OPEN, ABDOMINAL WALL, ANTERIOR (S31.109A) HX DIVERTICULITIS OF COLON - F/U PRN (Z87.19)  Past Surgical History [name], MD; 05/30/2021 11:10 AM) Knee Surgery Bilateral.  Diagnostic Studies History [name], MD; 05/30/2021 11:10 AM) Mammogram 1-3 years ago Pap Smear >5 years ago  Allergies Janeann Forehand, CNA; 05/30/2021 10:44 AM) No Known Drug Allergies [12/20/2020]: No Known Allergies [02/14/2021]: Allergies Reconciled  Medication History Janeann Forehand, CNA; 05/30/2021 10:44 AM) oxyCODONE HCl (5MG  Tablet, 1 (one) Oral every six hours, as needed, Taken starting 01/11/2021) Active. Methocarbamol (500MG  Tablet, 1 (one) Oral four times daily, as needed, Taken starting 03/28/2021) Active. Neomycin Sulfate (500MG  Tablet, 2 (two) Oral SEE NOTE, Taken starting 02/14/2021) Active. (TAKE TWO TABLETS AT 2 PM, 3 PM, AND 10 PM THE DAY PRIOR TO  SURGERY) Alendronate  Sodium (70MG  Tablet, Oral) Active. Fluticasone Propionate (50MCG/ACT Suspension, Nasal) Active. hydroCHLOROthiazide (12.5MG  Capsule, Oral) Active. Metoprolol Tartrate (50MG  Tablet, Oral) Active. Omeprazole (20MG  Capsule DR, Oral) Active. Rosuvastatin Calcium (5MG  Tablet, Oral) Active. Topiramate (100MG  Tablet, Oral) Active. Tylenol (500MG  Capsule, 1 (one) Oral) Active. Lyrica (150MG  Capsule, Oral) Active. Calcium Carbonate (Oral) Specific strength unknown - Active. Multiple Vitamin (1 (one) Oral) Active. Medications Reconciled  Social History [name], MD; 05/30/2021 11:10 AM) Alcohol use Occasional alcohol use. Caffeine use Carbonated beverages, Coffee, Tea. No drug use Tobacco use Never smoker.  Family History [name], MD; 05/30/2021 11:10 AM) Arthritis Sister. Cancer Father. Colon Polyps Sister. Hypertension Sister.  Pregnancy / Birth History [name], MD; 05/30/2021 11:10 AM) Age at menarche 64 years. Age of menopause 57-50 Gravida 1 Length (months) of breastfeeding 3-6 Maternal age 87-30 Para 1  Other Problems [name], MD; 05/30/2021 11:10 AM) Back Pain Diverticulosis High blood pressure Hypercholesterolemia     Physical Exam [name] MD; 05/30/2021 11:37 AM)  Abdomen Note: Large superficial open wound on the lower, central abdomen - without erythema or drainage. 10 x 4 cm region. Looks larger to me. Mostly epithelialized but still a little bit of moisture. I can feel small bowel through it. Dermis is thickened and edematous. It partially puuls off but not fully off the small bowel.  Large midline abdominal hernia/diastases laxity.  Left lower quadrant old colostomy well-healed without any obvious hernia.    Assessment & Plan [name] MD; 05/30/2021 1:17 PM)  INCISIONAL HERNIA OF ANTERIOR ABDOMINAL WALL WITHOUT OBSTRUCTION OR GANGRENE (K43.2) Impression: Large  worsening midline incisional hernia in the setting of moderate diastases recti with midline skin breakdown  While it is trying to epithelialize it won't fully do it. Her chronic hernia sac as well as skin wound is gradually getting larger.  I think this requires operative repair.  Most likely this would be an open type of approach with a TAR component separation with open reconstruction and underlay mesh. Inpatient surgery. Lysis of adhesions. Excise chronic scar and wound in the midline. Try and use permanent mesh. May need Phasix if there is evidence of significant infection or contamination.  Ideally would wait 6 months from last surgery to allow adhesions to be minimal, nutrition to be optimal, risk of infection minimized. That would place this into September. I will start the ball rolling with posting etc.  Recommend she keep the area covered protected that she is doing. Get an abdominal wall binder to help with the swelling and discomfort. Protect the skin.  She is frustrated with the intermittent discomfort and worsening swelling. While there are increased risk with a larger hernia repair, she actually has good performance status and seems to be doing better. It is worsening despite nonoperative management. She agrees to surgery.   WOUND, OPEN, ABDOMINAL WALL, ANTERIOR (S31.109A) Impression: Chronic midline infraumbilical wound enlarging. Some epithelialization but not resolving well.  Continue dry gauze telfa over it. Consider wearing a binder with protective cloth over it to help hold it in and minimize pressure of it worsening.  Once I think she requires surgery to remove the redundant skin and chronic ulcer at the time of an open hernia repair since the hernia is getting larger and bothering her more. She agrees.   S/P COLOSTOMY TAKEDOWN (W29.562) Impression: Recovering well status post colostomy takedown   PREOP - VWH - ENCOUNTER FOR PREOPERATIVE EXAMINATION FOR GENERAL  SURGICAL PROCEDURE (Z01.818)  Current  Plans You are being scheduled for surgery- Our schedulers will call you.  You should hear from our office's scheduling department within 5 working days about the location, date, and time of surgery. We try to make accommodations for patient's preferences in scheduling surgery, but sometimes the OR schedule or the surgeon's schedule prevents [place] from making those accommodations.  If you have not heard from our office [phone]) in 5 working days, call the office and ask for your surgeon's nurse.  If you have other questions about your diagnosis, plan, or surgery, call the office and ask for your surgeon's nurse.  Written instructions provided [facility] Consent - Hernia Repair - Ventral/Incisional/Umbilical (Blanchie Zeleznik): discussed with patient and provided information. Pt Education - [facility] Hernia Post-Op HCI (Montay Vanvoorhis): discussed with patient and provided information. Pt Education - [facility] Mesh education: discussed with patient and provided information.  HX DIVERTICULITIS OF COLON - F/U PRN (Z87.19)  Current Plans Consider follow up colonoscopy by your gastroenterologist, depending on your diagnosis. Call your gastroenterologist for advice  If you had a colon or rectal cancer resected by surgery, you should strongly consider getting a colonoscopy by your gastroenterologict one year after the colon cancer was removed. If it was a benign polyp that was removed by colon resection, consider follow-up colonoscopy in about 3 years.  Pt Education - [facility] Diverticular Disease (AT) Pt Education - [facility] Good Bowel Health (Jasilyn Holderman)   [name], MD, FACS, MASCRS Esophageal, Gastrointestinal & Colorectal Surgery Robotic and Minimally Invasive Surgery [facility] [address] [place], [place] [address] [phone] Fax [phone] Main/Paging  CONTACT INFORMATION: Weekday (9AM-5PM) concerns: Call [facility] main office at  [phone] Weeknight (5PM-9AM) or Weekend/Holiday concerns: Check [email or web address] for General Surgery [facility] coverage (Please, do not use SecureChat as it is not reliable communication to operating surgeons for immediate patient care)

## 2021-05-31 NOTE — Progress Notes (Signed)
Subjective:    Patient ID: Chloe Young, female    DOB: 11-15-1951, 70 y.o.   MRN: [id number]   This visit occurred during the SARS-CoV-2 public health emergency.  Safety protocols were in place, including screening questions prior to the visit, additional usage of staff PPE, and extensive cleaning of exam room while observing appropriate contact time as indicated for disinfecting solutions.    HPI She is here for a physical exam.   She does need another surgery for an abdominal hernia - it will be in Sept.   Medications and allergies reviewed with patient and updated if appropriate.  Patient Active Problem List   Diagnosis Date Noted  . ETD (Eustachian tube dysfunction), right 03/30/2021  . Parastomal hernia s/p robotic colostomy takedown 03/23/2021 03/24/2021  . Hypomagnesemia 03/24/2021  . Diverticulitis of large intestine with perforation and abscess s/p Hartmann/colostomy  11/08/2020 03/23/2021  . Chronic nasal congestion 03/10/2020  . Lumbar radiculopathy 06/15/2016  . Cervicogenic headache 06/15/2016  . Osteopenia 06/15/2016  . GERD (gastroesophageal reflux disease) 06/15/2016  . Prediabetes 11/25/2013  . Hypokalemia 11/25/2013  . Nonspecific abnormal electrocardiogram (ECG) (EKG) 05/28/2012  . Cervical post-laminectomy syndrome 03/18/2012  . Osteoarthritis of both knees 03/18/2012  . Raynaud's syndrome 10/11/2010  . NECK PAIN, CHRONIC 04/19/2010  . Hyperlipidemia 04/19/2009  . Essential hypertension 04/19/2009    Current Outpatient Medications on File Prior to Visit  Medication Sig Dispense Refill  . acetaminophen (TYLENOL) 500 MG tablet Take 1 tablet (500 mg total) by mouth every 6 (six) hours as needed for moderate pain. 30 tablet 0  . alendronate (FOSAMAX) 70 MG tablet TAKE 1 TABLET BY MOUTH  EVERY 7 DAYS TAKE WITH A  FULL GLASS OF WATER ON AN  EMPTY STOMACH (Patient taking differently: Take 70 mg by mouth every Thursday. TAKE 1 TABLET BY MOUTH  EVERY 7 DAYS  TAKE WITH A  FULL GLASS OF WATER ON AN  EMPTY STOMACH) 12 tablet 3  . Calcium Carbonate-Vitamin D (CALCIUM + D PO) Take 1 tablet by mouth daily.    . carboxymethylcellul-glycerin (LUBRICANT DROPS/DUAL-ACTION) 0.5-0.9 % ophthalmic solution Place 1 drop into both eyes 3 (three) times daily as needed for dry eyes.    . fluticasone (FLONASE) 50 MCG/ACT nasal spray USE 1 SPRAY IN BOTH  NOSTRILS TWICE DAILY (Patient taking differently: Place 1 spray into both nostrils 2 (two) times daily as needed for allergies.) 48 g 1  . hydrochlorothiazide (MICROZIDE) 12.5 MG capsule TAKE 1 CAPSULE BY MOUTH  DAILY (Patient taking differently: Take 12.5 mg by mouth in the morning.) 90 capsule 3  . LYRICA 150 MG capsule Take 150 mg by mouth 2 (two) times daily.     . metoprolol tartrate (LOPRESSOR) 50 MG tablet TAKE ONE-HALF TABLET BY  MOUTH TWICE DAILY (Patient taking differently: Take 25 mg by mouth 2 (two) times daily.) 90 tablet 3  . Multiple Vitamin (MULITIVITAMIN WITH MINERALS) TABS Take 1 tablet by mouth in the morning.    Marland Kitchen omeprazole (PRILOSEC) 20 MG capsule TAKE 1 CAPSULE BY MOUTH  DAILY (Patient taking differently: Take 20 mg by mouth in the morning.) 90 capsule 3  . oxyCODONE (OXY IR/ROXICODONE) 5 MG immediate release tablet Take 1 tablet (5 mg total) by mouth every 6 (six) hours as needed for severe pain. 10 tablet 0  . rosuvastatin (CRESTOR) 5 MG tablet TAKE 1 TABLET BY MOUTH  DAILY (Patient taking differently: Take 5 mg by mouth in the morning.) 90 tablet 3  .  topiramate (TOPAMAX) 100 MG tablet Take 100 mg by mouth 2 (two) times daily.     No current facility-administered medications on file prior to visit.    Past Medical History:  Diagnosis Date  . Cervical facet syndrome   . Cervical post-laminectomy syndrome    Dr Tessa [facility]  . Cervicalgia   . Chronic pain syndrome   . Closed fracture of proximal end of right humerus 03/08/2021  . GERD (gastroesophageal reflux disease)   . Headache     due to neck fusion  . Hyperlipidemia    joint pain with statin  . Hypertension   . Osteoarthritis, localized, knee    OA AND PAIN LEFT KNEE  -- S/P RIGHT  KNEE ARTHROPLASTY ON 82012--KNEE IS DOING WELL.  . Osteopenia 06/2016    T score -1.6 FRAX not calculated  . Perforated abdominal viscus   . Severe sepsis ([place]) 11/08/2020    Past Surgical History:  Procedure Laterality Date  . CERVIAL FUSION  2006   Dr Arnoldo Morale, [place]  . COLECTOMY WITH COLOSTOMY CREATION/HARTMANN PROCEDURE  11/08/2020   Procedure: SIGMOID COLECTOMY WITH COLOSTOMY CREATION/HARTMANN PROCEDURE;  Surgeon: Stark Klein, MD;  Location: [facility];  Service: General;;  . COLONOSCOPY  2011    Dr Fuller Plan  . CYSTOSCOPY N/A 03/23/2021   Procedure: CYSTOSCOPY WITH BILATERAL FIREFLY INJECTION;  Surgeon: Raynelle Bring, MD;  Location: [facility];  Service: Urology;  Laterality: N/A;  . ESI  09/03/13   L 5- S1 ; Dr Nelva Bush  . JOINT REPLACEMENT  AUG 2012    RIGHT KNEE ARTHROPLASTY  . KNEE ARTHROSCOPY     bilaterally; Dr Tonita Cong  . LAPAROTOMY N/A 11/08/2020   Procedure: EXPLORATORY LAPAROTOMY;  Surgeon: Stark Klein, MD;  Location: [facility];  Service: General;  Laterality: N/A;  . TOTAL KNEE ARTHROPLASTY  01/11/2012   Procedure: TOTAL KNEE ARTHROPLASTY;  Surgeon: Johnn Hai, MD;  Location: [facility];  Service: Orthopedics;  Laterality: Left;  General with Femoral nerve block    Social History   Socioeconomic History  . Marital status: Married    Spouse name: Not on file  . Number of children: 1  . Years of education: Not on file  . Highest education level: Not on file  Occupational History  . Occupation: HAIRDRESSER    Employer: [employer]  Tobacco Use  . Smoking status: Never Smoker  . Smokeless tobacco: Never Used  Vaping Use  . Vaping Use: Never used  Substance and Sexual Activity  . Alcohol use: Yes    Alcohol/week: 14.0 standard drinks    Types: 14 Shots of liquor per week    Comment: 2 DRINKS PER NIGHT  . Drug  use: No  . Sexual activity: Yes    Birth control/protection: Post-menopausal  Other Topics Concern  . Not on file  Social History Narrative  . Not on file   Social Determinants of Health   Financial Resource Strain: Not on file  Food Insecurity: Not on file  Transportation Needs: Not on file  Physical Activity: Not on file  Stress: Not on file  Social Connections: Not on file    Family History  Problem Relation Age of Onset  . Diverticulitis Mother 57       with fissures.Marland Kitchenopted for no surgery  . Cancer Father 41        malignant brain tumor  . Breast cancer Maternal Aunt   . Diabetes Maternal Grandfather   . Heart failure Maternal Grandfather   .  Stroke Maternal Uncle        in 66s  . Heart disease Maternal Uncle        X4 ; 1 had MI @ 79  . Hypertension Sister   . Diverticulitis Sister   . Colon cancer Neg Hx   . Colon polyps Neg Hx   . Esophageal cancer Neg Hx   . Rectal cancer Neg Hx   . Stomach cancer Neg Hx     Review of Systems  Constitutional: Negative for chills and fever.  Eyes: Negative for visual disturbance.  Respiratory: Negative for cough, shortness of breath and wheezing.   Cardiovascular: Negative for chest pain, palpitations and leg swelling.  Gastrointestinal: Positive for abdominal pain (hernia ). Negative for blood in stool, constipation, diarrhea and nausea.  Genitourinary: Negative for dysuria.  Musculoskeletal: Negative for arthralgias and back pain.  Skin: Negative for rash.  Neurological: Positive for headaches. Negative for light-headedness.  Psychiatric/Behavioral: Negative for dysphoric mood. The patient is not nervous/anxious.        Objective:   Vitals:   06/01/21 1102  BP: 102/68  Pulse: 73  Temp: 98.8 F (37.1 C)  SpO2: 97%   Filed Weights   06/01/21 1102  Weight: 118 lb (53.5 kg)   Body mass index is 22.3 kg/m.  BP Readings from Last 3 Encounters:  06/01/21 102/68  03/30/21 108/68  03/25/21 106/64    Wt  Readings from Last 3 Encounters:  06/01/21 118 lb (53.5 kg)  03/30/21 126 lb 3.2 oz (57.2 kg)  03/25/21 128 lb 8.5 oz (58.3 kg)     Physical Exam Constitutional: She appears well-developed and well-nourished. No distress.  HENT:  Head: Normocephalic and atraumatic.  Right Ear: External ear normal. Normal ear canal and TM Left Ear: External ear normal.  Normal ear canal and TM Mouth/Throat: Oropharynx is clear and moist.  Eyes: Conjunctivae and EOM are normal.  Neck: Neck supple. No tracheal deviation present. No thyromegaly present.  No carotid bruit  Cardiovascular: Normal rate, regular rhythm and normal heart sounds.   No murmur heard.  No edema. Pulmonary/Chest: Effort normal and breath sounds normal. No respiratory distress. She has no wheezes. She has no rales.  Breast: deferred   Abdominal: Soft. Large lower abdominal hernia.  She exhibits no distension. There is no tenderness.  Lymphadenopathy: She has no cervical adenopathy.  Skin: Skin is warm and dry. She is not diaphoretic.  Psychiatric: She has a normal mood and affect. Her behavior is normal.        Assessment & Plan:   Physical exam: Screening blood work    ordered Immunizations  tdap due, covid booster due Colonoscopy  Up to date  Mammogram  Up to date  Gyn  n/a Dexa   Up to date  Eye exams   Up to date  Exercise  Walking some Weight  normal Substance abuse  none      See Problem List for Assessment and Plan of chronic medical problems.

## 2021-05-31 NOTE — Patient Instructions (Addendum)
Blood work was ordered.     Medications changes include :   Stop the hydrochlorothiazide.    Your prescription(s) have been submitted to your pharmacy. Please take as directed and contact our office if you believe you are having problem(s) with the medication(s).   Please followup in 3 months    Health Maintenance, Female Adopting a healthy lifestyle and getting preventive care are important in promoting health and wellness. Ask your health care provider about:  The right schedule for you to have regular tests and exams.  Things you can do on your own to prevent diseases and keep yourself healthy. What should I know about diet, weight, and exercise? Eat a healthy diet  Eat a diet that includes plenty of vegetables, fruits, low-fat dairy products, and lean protein.  Do not eat a lot of foods that are high in solid fats, added sugars, or sodium.   Maintain a healthy weight Body mass index (BMI) is used to identify weight problems. It estimates body fat based on height and weight. Your health care provider can help determine your BMI and help you achieve or maintain a healthy weight. Get regular exercise Get regular exercise. This is one of the most important things you can do for your health. Most adults should:  Exercise for at least 150 minutes each week. The exercise should increase your heart rate and make you sweat (moderate-intensity exercise).  Do strengthening exercises at least twice a week. This is in addition to the moderate-intensity exercise.  Spend less time sitting. Even light physical activity can be beneficial. Watch cholesterol and blood lipids Have your blood tested for lipids and cholesterol at 71 years of age, then have this test every 5 years. Have your cholesterol levels checked more often if:  Your lipid or cholesterol levels are high.  You are older than 71 years of age.  You are at high risk for heart disease. What should I know about cancer  screening? Depending on your health history and family history, you may need to have cancer screening at various ages. This may include screening for:  Breast cancer.  Cervical cancer.  Colorectal cancer.  Skin cancer.  Lung cancer. What should I know about heart disease, diabetes, and high blood pressure? Blood pressure and heart disease  High blood pressure causes heart disease and increases the risk of stroke. This is more likely to develop in people who have high blood pressure readings, are of African descent, or are overweight.  Have your blood pressure checked: ? Every 3-5 years if you are 35-73 years of age. ? Every year if you are 59 years old or older. Diabetes Have regular diabetes screenings. This checks your fasting blood sugar level. Have the screening done:  Once every three years after age 49 if you are at a normal weight and have a low risk for diabetes.  More often and at a younger age if you are overweight or have a high risk for diabetes. What should I know about preventing infection? Hepatitis B If you have a higher risk for hepatitis B, you should be screened for this virus. Talk with your health care provider to find out if you are at risk for hepatitis B infection. Hepatitis C Testing is recommended for:  Everyone born from [birth year] through 1966.  Anyone with known risk factors for hepatitis C. Sexually transmitted infections (STIs)  Get screened for STIs, including gonorrhea and chlamydia, if: ? You are sexually active and are younger than  71 years of age. ? You are older than 71 years of age and your health care provider tells you that you are at risk for this type of infection. ? Your sexual activity has changed since you were last screened, and you are at increased risk for chlamydia or gonorrhea. Ask your health care provider if you are at risk.  Ask your health care provider about whether you are at high risk for HIV. Your health care provider may  recommend a prescription medicine to help prevent HIV infection. If you choose to take medicine to prevent HIV, you should first get tested for HIV. You should then be tested every 3 months for as long as you are taking the medicine. Pregnancy  If you are about to stop having your period (premenopausal) and you may become pregnant, seek counseling before you get pregnant.  Take 400 to 800 micrograms (mcg) of folic acid every day if you become pregnant.  Ask for birth control (contraception) if you want to prevent pregnancy. Osteoporosis and menopause Osteoporosis is a disease in which the bones lose minerals and strength with aging. This can result in bone fractures. If you are 41 years old or older, or if you are at risk for osteoporosis and fractures, ask your health care provider if you should:  Be screened for bone loss.  Take a calcium or vitamin D supplement to lower your risk of fractures.  Be given hormone replacement therapy (HRT) to treat symptoms of menopause. Follow these instructions at home: Lifestyle  Do not use any products that contain nicotine or tobacco, such as cigarettes, e-cigarettes, and chewing tobacco. If you need help quitting, ask your health care provider.  Do not use street drugs.  Do not share needles.  Ask your health care provider for help if you need support or information about quitting drugs. Alcohol use  Do not drink alcohol if: ? Your health care provider tells you not to drink. ? You are pregnant, may be pregnant, or are planning to become pregnant.  If you drink alcohol: ? Limit how much you use to 0-1 drink a day. ? Limit intake if you are breastfeeding.  Be aware of how much alcohol is in your drink. In the [place] one drink equals one 12 oz bottle of beer (355 mL), one 5 oz glass of wine (148 mL), or one 1 oz glass of hard liquor (44 mL). General instructions  Schedule regular health, dental, and eye exams.  Stay current with your  vaccines.  Tell your health care provider if: ? You often feel depressed. ? You have ever been abused or do not feel safe at home. Summary  Adopting a healthy lifestyle and getting preventive care are important in promoting health and wellness.  Follow your health care provider's instructions about healthy diet, exercising, and getting tested or screened for diseases.  Follow your health care provider's instructions on monitoring your cholesterol and blood pressure. This information is not intended to replace advice given to you by your health care provider. Make sure you discuss any questions you have with your health care provider. Document Revised: 12/04/2018 Document Reviewed: 12/04/2018 Elsevier Patient Education  2021 [employer].

## 2021-06-01 ENCOUNTER — Ambulatory Visit (INDEPENDENT_AMBULATORY_CARE_PROVIDER_SITE_OTHER): Payer: Medicare Other | Admitting: Internal Medicine

## 2021-06-01 ENCOUNTER — Telehealth: Payer: Self-pay | Admitting: Internal Medicine

## 2021-06-01 ENCOUNTER — Other Ambulatory Visit: Payer: Self-pay

## 2021-06-01 ENCOUNTER — Other Ambulatory Visit: Payer: Self-pay | Admitting: Internal Medicine

## 2021-06-01 ENCOUNTER — Encounter: Payer: Self-pay | Admitting: Internal Medicine

## 2021-06-01 VITALS — BP 102/68 | HR 73 | Temp 98.8°F | Ht 61.0 in | Wt 118.0 lb

## 2021-06-01 DIAGNOSIS — Z0000 Encounter for general adult medical examination without abnormal findings: Secondary | ICD-10-CM | POA: Diagnosis not present

## 2021-06-01 DIAGNOSIS — M85851 Other specified disorders of bone density and structure, right thigh: Secondary | ICD-10-CM | POA: Diagnosis not present

## 2021-06-01 DIAGNOSIS — R7303 Prediabetes: Secondary | ICD-10-CM | POA: Diagnosis not present

## 2021-06-01 DIAGNOSIS — I10 Essential (primary) hypertension: Secondary | ICD-10-CM

## 2021-06-01 DIAGNOSIS — M85852 Other specified disorders of bone density and structure, left thigh: Secondary | ICD-10-CM | POA: Diagnosis not present

## 2021-06-01 DIAGNOSIS — G4486 Cervicogenic headache: Secondary | ICD-10-CM

## 2021-06-01 DIAGNOSIS — E876 Hypokalemia: Secondary | ICD-10-CM

## 2021-06-01 DIAGNOSIS — E782 Mixed hyperlipidemia: Secondary | ICD-10-CM

## 2021-06-01 DIAGNOSIS — K219 Gastro-esophageal reflux disease without esophagitis: Secondary | ICD-10-CM

## 2021-06-01 LAB — CBC WITH DIFFERENTIAL/PLATELET
Basophils Absolute: 0 10*3/uL (ref 0.0–0.1)
Basophils Relative: 0.2 % (ref 0.0–3.0)
Eosinophils Absolute: 0.1 10*3/uL (ref 0.0–0.7)
Eosinophils Relative: 1 % (ref 0.0–5.0)
HCT: 36.9 % (ref 36.0–46.0)
Hemoglobin: 12.2 g/dL (ref 12.0–15.0)
Lymphocytes Relative: 35.1 % (ref 12.0–46.0)
Lymphs Abs: 2.1 10*3/uL (ref 0.7–4.0)
MCHC: 33 g/dL (ref 30.0–36.0)
MCV: 83.6 fl (ref 78.0–100.0)
Monocytes Absolute: 0.4 10*3/uL (ref 0.1–1.0)
Monocytes Relative: 7.2 % (ref 3.0–12.0)
Neutro Abs: 3.4 10*3/uL (ref 1.4–7.7)
Neutrophils Relative %: 56.5 % (ref 43.0–77.0)
Platelets: 279 10*3/uL (ref 150.0–400.0)
RBC: 4.42 Mil/uL (ref 3.87–5.11)
RDW: 15.1 % (ref 11.5–15.5)
WBC: 6 10*3/uL (ref 4.0–10.5)

## 2021-06-01 LAB — COMPREHENSIVE METABOLIC PANEL
ALT: 23 U/L (ref 0–35)
AST: 23 U/L (ref 0–37)
Albumin: 4.5 g/dL (ref 3.5–5.2)
Alkaline Phosphatase: 78 U/L (ref 39–117)
BUN: 19 mg/dL (ref 6–23)
CO2: 26 mEq/L (ref 19–32)
Calcium: 9.9 mg/dL (ref 8.4–10.5)
Chloride: 99 mEq/L (ref 96–112)
Creatinine, Ser: 0.59 mg/dL (ref 0.40–1.20)
GFR: 90.91 mL/min (ref >60.00)
Glucose, Bld: 95 mg/dL (ref 70–99)
Potassium: 3.7 mEq/L (ref 3.5–5.1)
Sodium: 135 mEq/L (ref 135–145)
Total Bilirubin: 0.4 mg/dL (ref 0.2–1.2)
Total Protein: 7.8 g/dL (ref 6.0–8.3)

## 2021-06-01 LAB — LIPID PANEL
Cholesterol: 171 mg/dL (ref 0–200)
HDL: 57.3 mg/dL (ref >39.00)
LDL Cholesterol: 92 mg/dL (ref 0–99)
NonHDL: 113.81
Total CHOL/HDL Ratio: 3
Triglycerides: 108 mg/dL (ref 0.0–149.0)
VLDL: 21.6 mg/dL (ref 0.0–40.0)

## 2021-06-01 LAB — MAGNESIUM: Magnesium: 2.3 mg/dL (ref 1.5–2.5)

## 2021-06-01 LAB — TSH: TSH: 1.18 u[IU]/mL (ref 0.35–4.50)

## 2021-06-01 LAB — HEMOGLOBIN A1C: Hgb A1c MFr Bld: 5.6 % (ref 4.6–6.5)

## 2021-06-01 MED ORDER — OXYCODONE-ACETAMINOPHEN 7.5-325 MG PO TABS: 1.0000 | ORAL_TABLET | Freq: Two times a day (BID) | ORAL | 0 refills | Status: DC | PRN

## 2021-06-01 MED ORDER — LYRICA 150 MG PO CAPS: 150.0000 mg | ORAL_CAPSULE | Freq: Two times a day (BID) | ORAL | 5 refills | Status: DC

## 2021-06-01 MED ORDER — TOPIRAMATE 100 MG PO TABS: 100.0000 mg | ORAL_TABLET | Freq: Two times a day (BID) | ORAL | 1 refills | Status: DC

## 2021-06-01 NOTE — Assessment & Plan Note (Signed)
Magnesium level

## 2021-06-01 NOTE — Assessment & Plan Note (Signed)
Chronic BP on low side -- overcontrolled Continue metoprolol 25 mg bid Discontinue hctz 12. 5 mg daily cmp

## 2021-06-01 NOTE — Assessment & Plan Note (Signed)
Chronic Check a1c Low sugar / carb diet Stressed regular exercise  

## 2021-06-01 NOTE — Assessment & Plan Note (Signed)
Chronic GERD controlled Continue omeprazole 20 mg

## 2021-06-01 NOTE — Assessment & Plan Note (Addendum)
Chronic Related to cervical spine surgery Was seeing pain management but no longer getting injections I will prescribe her meds Continue lyrica 150 mg bid Continue topamax 100 mg bid I will prescribe percocet 7.5-325 mg po bid prn

## 2021-06-01 NOTE — Assessment & Plan Note (Signed)
cmp

## 2021-06-01 NOTE — Telephone Encounter (Signed)
    [employer],   LYRICA 150 MG capsule brand name not covered by insurance, can med be changed to generic

## 2021-06-01 NOTE — Assessment & Plan Note (Signed)
Chronic dexa up to date Continue fosamax 70mg  weekly - started 09/2019 Continue calcium / vitamin D  Walking regularly

## 2021-06-01 NOTE — Assessment & Plan Note (Signed)
Chronic Check lipid panel  Continue crestor 5 mg daily Regular exercise and healthy diet encouraged  

## 2021-06-01 NOTE — Telephone Encounter (Signed)
Patient had already picked up script today.

## 2021-06-01 NOTE — Telephone Encounter (Signed)
If okay with patient.  It was in her med list as d.a.w. please check with her.

## 2021-06-10 ENCOUNTER — Encounter: Payer: Self-pay | Admitting: Internal Medicine

## 2021-06-10 NOTE — Progress Notes (Unsigned)
Outside notes received. Information abstracted. Notes sent to scan.  

## 2021-06-28 ENCOUNTER — Telehealth: Payer: Self-pay

## 2021-06-28 NOTE — Telephone Encounter (Signed)
PA Key: [id number]  Lyrica 150mg  was approved through 12/24/21.

## 2021-07-05 ENCOUNTER — Ambulatory Visit: Payer: Medicare Other

## 2021-08-08 ENCOUNTER — Telehealth: Payer: Self-pay | Admitting: Pharmacist

## 2021-08-08 NOTE — Progress Notes (Signed)
    Chronic Care Management Pharmacy Assistant   Name: Chloe Young  MRN: [id number] DOB: 24-May-1951   Reason for Encounter: Disease State   Conditions to be addressed/monitored: General    Recent office visits:  06/01/21 Dr. Billey Gosling (PCP) Reason: Preventative Health Care Medication changes:Discontinue hctz 12. 5 mg daily Orders:Blood work was ordered, CBC, CMP, A1c, Lipid panel, Mg, and TSH  Recent consult visits:  None ID  Hospital visits:  None in previous 6 months  Medications: Outpatient Encounter Medications as of 08/08/2021  Medication Sig   acetaminophen (TYLENOL) 500 MG tablet Take 1 tablet (500 mg total) by mouth every 6 (six) hours as needed for moderate pain.   alendronate (FOSAMAX) 70 MG tablet TAKE 1 TABLET BY MOUTH  EVERY 7 DAYS TAKE WITH A  FULL GLASS OF WATER ON AN  EMPTY STOMACH (Patient taking differently: Take 70 mg by mouth every Thursday. TAKE 1 TABLET BY MOUTH  EVERY 7 DAYS TAKE WITH A  FULL GLASS OF WATER ON AN  EMPTY STOMACH)   Calcium Carbonate-Vitamin D (CALCIUM + D PO) Take 1 tablet by mouth daily.   carboxymethylcellul-glycerin (LUBRICANT DROPS/DUAL-ACTION) 0.5-0.9 % ophthalmic solution Place 1 drop into both eyes 3 (three) times daily as needed for dry eyes.   fluticasone (FLONASE) 50 MCG/ACT nasal spray USE 1 SPRAY IN BOTH  NOSTRILS TWICE DAILY (Patient taking differently: Place 1 spray into both nostrils 2 (two) times daily as needed for allergies.)   LYRICA 150 MG capsule Take 1 capsule (150 mg total) by mouth 2 (two) times daily.   metoprolol tartrate (LOPRESSOR) 50 MG tablet TAKE ONE-HALF TABLET BY  MOUTH TWICE DAILY (Patient taking differently: Take 25 mg by mouth 2 (two) times daily.)   Multiple Vitamin (MULITIVITAMIN WITH MINERALS) TABS Take 1 tablet by mouth in the morning.   omeprazole (PRILOSEC) 20 MG capsule TAKE 1 CAPSULE BY MOUTH  DAILY (Patient taking differently: Take 20 mg by mouth in the morning.)   oxyCODONE-acetaminophen  (PERCOCET) 7.5-325 MG tablet Take 1 tablet by mouth every 12 (twelve) hours as needed for severe pain (chronic cervicogenic headaches).   rosuvastatin (CRESTOR) 5 MG tablet TAKE 1 TABLET BY MOUTH  DAILY (Patient taking differently: Take 5 mg by mouth in the morning.)   topiramate (TOPAMAX) 100 MG tablet Take 1 tablet (100 mg total) by mouth 2 (two) times daily.   No facility-administered encounter medications on file as of 08/08/2021.  Pharmacist Review  Have you had any problems recently with your health? Patient states that she is doing well and that she does not have any new health issues  Have you had any problems with your pharmacy? Patient states that she does not have any issues with getting her medication or the cost of medications from the pharmacy  What issues or side effects are you having with your medications? Patient states that she does not have any side effects from medications  What would you like me to pass along to [facility] for them to help you with?  Patient states that she does not have any concerns about her health or medications at this time  What can we do to take care of you better?  Patient states that she does not need anything at this time  Star Rating Drugs: Rosuvastatin 5 mg-last fill 05/12/21 90 ds  Ethelene Hal Clinical Pharmacist Assistant [phone]   Time spent:20

## 2021-08-09 ENCOUNTER — Telehealth: Payer: Self-pay | Admitting: Internal Medicine

## 2021-08-09 NOTE — Telephone Encounter (Signed)
Patient wanting to know for both her and her husband Chloe Young 2.[birth year]) if they should get the third covid booster.  Mrs. Hemsley is having surgery in a couple of weeks, surgeon's office told her it was fine to get the covid booster prior to, but patient wants to hear from Dr. Quay Burow

## 2021-08-10 NOTE — Telephone Encounter (Signed)
Patient informed ok to proceed with getting booster.

## 2021-08-19 ENCOUNTER — Other Ambulatory Visit (HOSPITAL_COMMUNITY): Payer: Self-pay

## 2021-08-22 NOTE — Progress Notes (Signed)
DUE TO COVID-19 ONLY ONE VISITOR IS ALLOWED TO COME WITH YOU AND STAY IN THE WAITING ROOM ONLY DURING PRE OP AND PROCEDURE DAY OF SURGERY IF YOU ARE GOING HOME AFTER YOUR SURGERY .IF YOU ARE SPENDING THE NIGHT YOU MAY HAVE 2 VISITORS DAY OF SURGERY WHO MAY VISIT IN YOUR PRIVATE ROOM UNTIL 800 PM AND MUST GO HOME WHEN VISITING HOURS ARE OVER AT 800 PM.   YOU NEED TO HAVE A COVID 19 TEST ON___  08/30/2021 ____ '@_______'$ , THIS TEST MUST BE DONE BEFORE SURGERY,  COVID TESTING SITE IS AT [address], [place], AND REMAIN IN YOUR CAR.  THIS IS A DRIVE UP TEST. AFTER YOUR COVID TEST, PLAESE WEAR A MASK OUT IN PUBLIC AND SOCIAL DISTANCE AND Smith YOUR HANDS FREQUENTLY. PLEASE ASK ALL YOUR CLOSE HOUSEHOLD CONTACTS TO WEAR MASK OUT IN PUBLIC AND SOCIAL DISTANCE ALSO.                Chloe Young  08/22/2021   Your procedure is scheduled on:  09/01/2021   Report to [facility] Main  Entrance   Report to admitting at     [phone]     Call this number if you have problems the morning of surgery [phone]           REMEMBER: FOLLOW ALL YOUR BOWEL PREP INSTRUCTIONS WITH CLEAR LIQUIDS FROM YOUR SURGEON'S INSTRUCTIONS. DRINK 2 PRESURGERY ENSURE DRINKS THE NIGHT BEFORE SURGERY AT 1000 PM AND 1 PRESURGERY DRINK THE DAY OF THE PROCEDURE 3 HOURS PRIOR TO SCHEDULED SURGERY.  NOTHING BY MOUTH EXCEPT CLEAR LIQUIDS UNTIL THREE HOURS PRIOR TO SCHEDULED SURGERY. PLEASE FINISH PRESURGERY 3RD  ENSURE  DRINK PER SURGEON ORDER 3 HOURS PRIOR TO SCHEDULED SURGERY TIME WHICH NEEDS TO BE COMPLETED AT _________.     CLEAR LIQUID DIET   Foods Allowed                                                                      Coffee and tea, regular and decaf                           Plain Jell-O any favor except red or purple                                         Fruit ices (not with fruit pulp)                                      Iced Popsicles                                     Carbonated beverages, regular  and diet                                    Cranberry, grape and apple juices Sports drinks like Gatorade Lightly  seasoned clear broth or consume(fat free) Sugar  _____________________________________________________________________      BRUSH YOUR TEETH MORNING OF SURGERY AND RINSE YOUR MOUTH OUT, NO CHEWING GUM CANDY OR MINTS.     Take these medicines the morning of surgery with A SIP OF WATER:  Allegra, lyrica, metoprolol, prilosec, topamax  DO NOT TAKE ANY DIABETIC MEDICATIONS DAY OF YOUR SURGERY                               You may not have any metal on your body including hair pins and              piercings  Do not wear jewelry, make-up, lotions, powders or perfumes, deodorant             Do not wear nail polish on your fingernails.  Do not shave  48 hours prior to surgery.              Men may shave face and neck.   Do not bring valuables to the hospital. [place].  Contacts, dentures or bridgework may not be worn into surgery.  Leave suitcase in the car. After surgery it may be brought to your room.                 Please read over the following fact sheets you were given: _____________________________________________________________________  [facility] - Preparing for Surgery Before surgery, you can play an important role.  Because skin is not sterile, your skin needs to be as free of germs as possible.  You can reduce the number of germs on your skin by washing with CHG (chlorahexidine gluconate) soap before surgery.  CHG is an antiseptic cleaner which kills germs and bonds with the skin to continue killing germs even after washing. Please DO NOT use if you have an allergy to CHG or antibacterial soaps.  If your skin becomes reddened/irritated stop using the CHG and inform your nurse when you arrive at Short Stay. Do not shave (including legs and underarms) for at least 48 hours prior to the first CHG shower.  You may  shave your face/neck. Please follow these instructions carefully:  1.  Shower with CHG Soap the night before surgery and the  morning of Surgery.  2.  If you choose to wash your hair, wash your hair first as usual with your  normal  shampoo.  3.  After you shampoo, rinse your hair and body thoroughly to remove the  shampoo.                           4.  Use CHG as you would any other liquid soap.  You can apply chg directly  to the skin and wash                       Gently with a scrungie or clean washcloth.  5.  Apply the CHG Soap to your body ONLY FROM THE NECK DOWN.   Do not use on face/ open                           Wound or open sores. Avoid contact with eyes, ears mouth and genitals (private parts).  Wash face,  Genitals (private parts) with your normal soap.             6.  Wash thoroughly, paying special attention to the area where your surgery  will be performed.  7.  Thoroughly rinse your body with warm water from the neck down.  8.  DO NOT shower/wash with your normal soap after using and rinsing off  the CHG Soap.                9.  Pat yourself dry with a clean towel.            10.  Wear clean pajamas.            11.  Place clean sheets on your bed the night of your first shower and do not  sleep with pets. Day of Surgery : Do not apply any lotions/deodorants the morning of surgery.  Please wear clean clothes to the hospital/surgery center.  FAILURE TO FOLLOW THESE INSTRUCTIONS MAY RESULT IN THE CANCELLATION OF YOUR SURGERY PATIENT SIGNATURE_________________________________  NURSE SIGNATURE__________________________________  ________________________________________________________________________

## 2021-08-24 ENCOUNTER — Other Ambulatory Visit: Payer: Self-pay

## 2021-08-24 ENCOUNTER — Encounter: Payer: Self-pay | Admitting: Internal Medicine

## 2021-08-24 ENCOUNTER — Encounter (HOSPITAL_COMMUNITY)
Admission: RE | Admit: 2021-08-24 | Discharge: 2021-08-24 | Disposition: A | Discharge status: Home / Self Care | Payer: Medicare Other | Source: Ambulatory Visit | Attending: Surgery | Admitting: Surgery

## 2021-08-24 ENCOUNTER — Encounter (HOSPITAL_COMMUNITY): Payer: Self-pay

## 2021-08-24 DIAGNOSIS — Z01818 Encounter for other preprocedural examination: Secondary | ICD-10-CM | POA: Diagnosis not present

## 2021-08-24 HISTORY — DX: Family history of other specified conditions: Z84.89

## 2021-08-24 LAB — BASIC METABOLIC PANEL
Anion gap: 6 (ref 5–15)
BUN: 18 mg/dL (ref 8–23)
CO2: 23 mmol/L (ref 22–32)
Calcium: 9.4 mg/dL (ref 8.9–10.3)
Chloride: 107 mmol/L (ref 98–111)
Creatinine, Ser: 0.51 mg/dL (ref 0.44–1.00)
GFR, Estimated: >60 mL/min (ref >60)
Glucose, Bld: 97 mg/dL (ref 70–99)
Potassium: 4 mmol/L (ref 3.5–5.1)
Sodium: 136 mmol/L (ref 135–145)

## 2021-08-24 LAB — CBC
HCT: 36.1 % (ref 36.0–46.0)
Hemoglobin: 11.5 g/dL — ABNORMAL LOW (ref 12.0–15.0)
MCH: 27.6 pg (ref 26.0–34.0)
MCHC: 31.9 g/dL (ref 30.0–36.0)
MCV: 86.6 fL (ref 80.0–100.0)
Platelets: 267 10*3/uL (ref 150–400)
RBC: 4.17 MIL/uL (ref 3.87–5.11)
RDW: 15.4 % (ref 11.5–15.5)
WBC: 5.5 10*3/uL (ref 4.0–10.5)
nRBC: 0 % (ref 0.0–0.2)

## 2021-08-24 NOTE — Progress Notes (Signed)
Subjective:    Patient ID: Chloe Young, female    DOB: 1951/03/21, 70 y.o.   MRN: [id number]  HPI The patient is here for follow up of their chronic medical problems, including htn, chichol, osteopenia, prediabetes, gerd, cervicogenic headache   Hernia surgery in one week.    She walks daily.      Medications and allergies reviewed with patient and updated if appropriate.  Patient Active Problem List   Diagnosis Date Noted   Aortic atherosclerosis ([place]) 08/25/2021   Encounter for chronic pain management 08/25/2021   ETD (Eustachian tube dysfunction), right 03/30/2021   Parastomal hernia s/p robotic colostomy takedown 03/23/2021 03/24/2021   Hypomagnesemia 03/24/2021   Diverticulitis of large intestine with perforation and abscess s/p Hartmann/colostomy  11/08/2020 03/23/2021   Chronic nasal congestion 03/10/2020   Lumbar radiculopathy 06/15/2016   Cervicogenic headache 06/15/2016   Osteopenia 06/15/2016   GERD (gastroesophageal reflux disease) 06/15/2016   Prediabetes 11/25/2013   Hypokalemia 11/25/2013   Nonspecific abnormal electrocardiogram (ECG) (EKG) 05/28/2012   Cervical post-laminectomy syndrome 03/18/2012   Osteoarthritis of both knees 03/18/2012   Raynaud's syndrome 10/11/2010   NECK PAIN, CHRONIC 04/19/2010   Hyperlipidemia 04/19/2009   Essential hypertension 04/19/2009    Current Outpatient Medications on File Prior to Visit  Medication Sig Dispense Refill   acetaminophen (TYLENOL) 500 MG tablet Take 500-1,000 mg by mouth every 6 (six) hours as needed (for pain.).     alendronate (FOSAMAX) 70 MG tablet TAKE 1 TABLET BY MOUTH  EVERY 7 DAYS TAKE WITH A  FULL GLASS OF WATER ON AN  EMPTY STOMACH (Patient taking differently: Take 70 mg by mouth every Thursday. TAKE 1 TABLET BY MOUTH  EVERY 7 DAYS TAKE WITH A  FULL GLASS OF WATER ON AN  EMPTY STOMACH) 12 tablet 3   amoxicillin (AMOXIL) 500 MG capsule Take 2,000 mg by mouth See admin instructions. Take 4 capsules  (2000 mg) by mouth 1 hour prior to dental appointment     Calcium Carbonate-Vitamin D (CALCIUM + D PO) Take 1 tablet by mouth daily.     carboxymethylcellul-glycerin (LUBRICANT DROPS/DUAL-ACTION) 0.5-0.9 % ophthalmic solution Place 1 drop into both eyes 3 (three) times daily as needed for dry eyes.     fexofenadine (ALLEGRA) 180 MG tablet Take 180 mg by mouth in the morning.     fluticasone (FLONASE) 50 MCG/ACT nasal spray USE 1 SPRAY IN BOTH  NOSTRILS TWICE DAILY (Patient taking differently: Place 1 spray into both nostrils 2 (two) times daily as needed for allergies.) 48 g 1   Lidocaine 4 % PTCH Place 1 patch onto the skin daily as needed (pain (neck)).     LYRICA 150 MG capsule Take 1 capsule (150 mg total) by mouth 2 (two) times daily. 60 capsule 5   metoprolol tartrate (LOPRESSOR) 50 MG tablet TAKE ONE-HALF TABLET BY  MOUTH TWICE DAILY 90 tablet 3   Multiple Vitamin (MULITIVITAMIN WITH MINERALS) TABS Take 1 tablet by mouth in the morning.     omeprazole (PRILOSEC) 20 MG capsule TAKE 1 CAPSULE BY MOUTH  DAILY (Patient taking differently: Take 20 mg by mouth in the morning.) 90 capsule 3   oxyCODONE-acetaminophen (PERCOCET) 7.5-325 MG tablet Take 1 tablet by mouth every 12 (twelve) hours as needed for severe pain (chronic cervicogenic headaches). 40 tablet 0   rosuvastatin (CRESTOR) 5 MG tablet TAKE 1 TABLET BY MOUTH  DAILY 90 tablet 3   topiramate (TOPAMAX) 100 MG tablet Take 1 tablet (100  mg total) by mouth 2 (two) times daily. 180 tablet 1   triamcinolone (NASACORT) 55 MCG/ACT AERO nasal inhaler Place 2 sprays into the nose daily as needed (allergies).     No current facility-administered medications on file prior to visit.    Past Medical History:  Diagnosis Date   Cervical facet syndrome    Cervical post-laminectomy syndrome    Dr Tessa Ler[facility]   Cervicalgia    Chronic pain syndrome    Closed fracture of proximal end of right humerus 03/08/2021   Family history of adverse  reaction to anesthesia    sister slow to wake up and nausea   GERD (gastroesophageal reflux disease)    Headache    due to neck fusion   Hyperlipidemia    joint pain with statin   Hypertension    Osteopenia 06/2016   T score -1.6 FRAX not calculated   Perforated abdominal viscus    Severe sepsis ([place]) 11/08/2020    Past Surgical History:  Procedure Laterality Date   CERVIAL FUSION  12/25/2004   Dr Arnoldo Morale, [place]   COLECTOMY WITH COLOSTOMY CREATION/HARTMANN PROCEDURE  11/08/2020   Procedure: SIGMOID COLECTOMY WITH COLOSTOMY CREATION/HARTMANN PROCEDURE;  Surgeon: Stark Klein, MD;  Location: [facility];  Service: General;;   COLONOSCOPY  12/25/2009    Dr Fuller Plan   COLOSTOMY REVERSAL     02/2021   CYSTOSCOPY N/A 03/23/2021   Procedure: CYSTOSCOPY WITH BILATERAL FIREFLY INJECTION;  Surgeon: Raynelle Bring, MD;  Location: [facility];  Service: Urology;  Laterality: N/A;   ESI  09/03/2013   L 5- S1 ; Dr Nelva Bush   JOINT REPLACEMENT  07/26/2011    RIGHT KNEE ARTHROPLASTY   KNEE ARTHROSCOPY     bilaterally; Dr Tonita Cong   LAPAROTOMY N/A 11/08/2020   Procedure: EXPLORATORY LAPAROTOMY;  Surgeon: Stark Klein, MD;  Location: [facility];  Service: General;  Laterality: N/A;   TOTAL KNEE ARTHROPLASTY  01/11/2012   Procedure: TOTAL KNEE ARTHROPLASTY;  Surgeon: Johnn Hai, MD;  Location: [facility];  Service: Orthopedics;  Laterality: Left;  General with Femoral nerve block    Social History   Socioeconomic History   Marital status: Married    Spouse name: Not on file   Number of children: 1   Years of education: Not on file   Highest education level: Not on file  Occupational History   Occupation: HAIRDRESSER    Employer: [employer]  Tobacco Use   Smoking status: Never   Smokeless tobacco: Never  Vaping Use   Vaping Use: Never used  Substance and Sexual Activity   Alcohol use: Not Currently    Comment: none since 10/2020   Drug use: No   Sexual activity: Yes    Birth control/protection:  Post-menopausal  Other Topics Concern   Not on file  Social History Narrative   Not on file   Social Determinants of Health   Financial Resource Strain: Not on file  Food Insecurity: Not on file  Transportation Needs: Not on file  Physical Activity: Not on file  Stress: Not on file  Social Connections: Not on file    Family History  Problem Relation Age of Onset   Diverticulitis Mother 8       with fissures.Marland Kitchenopted for no surgery   Cancer Father 38        malignant brain tumor   Breast cancer Maternal Aunt    Diabetes Maternal Grandfather    Heart failure Maternal Grandfather  Stroke Maternal Uncle        in 74s   Heart disease Maternal Uncle        X4 ; 1 had MI @ 65   Hypertension Sister    Diverticulitis Sister    Colon cancer Neg Hx    Colon polyps Neg Hx    Esophageal cancer Neg Hx    Rectal cancer Neg Hx    Stomach cancer Neg Hx     Review of Systems  Constitutional:  Negative for chills and fever.  Respiratory:  Negative for cough, shortness of breath and wheezing.   Cardiovascular:  Negative for chest pain, palpitations and leg swelling.  Gastrointestinal:  Positive for abdominal pain (from hernia). Negative for nausea.       Gerd controlled  Musculoskeletal:  Positive for neck pain.  Neurological:  Positive for headaches. Negative for light-headedness.      Objective:   Vitals:   08/25/21 1102  BP: 100/64  Pulse: 65  Temp: 98.4 F (36.9 C)  SpO2: 99%   BP Readings from Last 3 Encounters:  08/25/21 100/64  08/24/21 117/78  06/01/21 102/68   Wt Readings from Last 3 Encounters:  08/25/21 119 lb (54 kg)  08/24/21 118 lb (53.5 kg)  06/01/21 118 lb (53.5 kg)   Body mass index is 21.08 kg/m.   Physical Exam    Constitutional: Appears well-developed and well-nourished. No distress.  HENT:  Head: Normocephalic and atraumatic.  Neck: Neck supple. No tracheal deviation present. No thyromegaly present.  No cervical  lymphadenopathy Cardiovascular: Normal rate, regular rhythm and normal heart sounds.   No murmur heard. No carotid bruit .  No edema Pulmonary/Chest: Effort normal and breath sounds normal. No respiratory distress. No has no wheezes. No rales.  Skin: Skin is warm and dry. Not diaphoretic.  Psychiatric: Normal mood and affect. Behavior is normal.      Assessment & Plan:    See Problem List for Assessment and Plan of chronic medical problems.    This visit occurred during the SARS-CoV-2 public health emergency.  Safety protocols were in place, including screening questions prior to the visit, additional usage of staff PPE, and extensive cleaning of exam room while observing appropriate contact time as indicated for disinfecting solutions.

## 2021-08-24 NOTE — Progress Notes (Addendum)
Anesthesia Review:  PCP: DR Billey Gosling- [facility] 06/01/21  Cardiologist : none  Chest x-ray : EKG : 11/09/2020 Echo : Stress test: Cardiac Cath :  Activity level: can do a flight of stairs without difficulty  Sleep Study/ CPAP : none  Fasting Blood Sugar :      / Checks Blood Sugar -- times a day:   Blood Thinner/ Instructions /Last Dose: ASA / Instructions/ Last Dose :   Covid test- 08/30/21

## 2021-08-24 NOTE — Patient Instructions (Addendum)
  Please go down to the lab on the first floor and give a urine sample.   Medications changes include :   decrease metoprolol to 12.5 mg twice daily. Increase crestor 10 to mg daily.   Try flexeril 5-10 mg at night for your neck pain.   Your prescription(s) have been submitted to your pharmacy. Please take as directed and contact our office if you believe you are having problem(s) with the medication(s).    Please followup in 6 months

## 2021-08-25 ENCOUNTER — Telehealth: Payer: Self-pay | Admitting: Internal Medicine

## 2021-08-25 ENCOUNTER — Ambulatory Visit (INDEPENDENT_AMBULATORY_CARE_PROVIDER_SITE_OTHER): Payer: Medicare Other | Admitting: Internal Medicine

## 2021-08-25 VITALS — BP 100/64 | HR 65 | Temp 98.4°F | Ht 63.0 in | Wt 119.0 lb

## 2021-08-25 DIAGNOSIS — M810 Age-related osteoporosis without current pathological fracture: Secondary | ICD-10-CM | POA: Diagnosis not present

## 2021-08-25 DIAGNOSIS — I700 Atherosclerosis of aorta: Secondary | ICD-10-CM

## 2021-08-25 DIAGNOSIS — G4486 Cervicogenic headache: Secondary | ICD-10-CM | POA: Diagnosis not present

## 2021-08-25 DIAGNOSIS — I10 Essential (primary) hypertension: Secondary | ICD-10-CM

## 2021-08-25 DIAGNOSIS — R7303 Prediabetes: Secondary | ICD-10-CM

## 2021-08-25 DIAGNOSIS — G8929 Other chronic pain: Secondary | ICD-10-CM | POA: Insufficient documentation

## 2021-08-25 DIAGNOSIS — K219 Gastro-esophageal reflux disease without esophagitis: Secondary | ICD-10-CM

## 2021-08-25 DIAGNOSIS — E782 Mixed hyperlipidemia: Secondary | ICD-10-CM

## 2021-08-25 DIAGNOSIS — M85851 Other specified disorders of bone density and structure, right thigh: Secondary | ICD-10-CM

## 2021-08-25 DIAGNOSIS — Z0289 Encounter for other administrative examinations: Secondary | ICD-10-CM | POA: Insufficient documentation

## 2021-08-25 MED ORDER — CYCLOBENZAPRINE HCL 5 MG PO TABS: 5.0000 mg | ORAL_TABLET | Freq: Every evening | ORAL | 2 refills | Status: DC | PRN

## 2021-08-25 MED ORDER — ROSUVASTATIN CALCIUM 10 MG PO TABS: 10.0000 mg | ORAL_TABLET | Freq: Every day | ORAL | 3 refills | Status: DC

## 2021-08-25 MED ORDER — METOPROLOL TARTRATE 25 MG PO TABS: 12.5000 mg | ORAL_TABLET | Freq: Two times a day (BID) | ORAL | 3 refills | Status: DC

## 2021-08-25 NOTE — Telephone Encounter (Signed)
Hello. You have an appointment on (Day, Date, Time)   Please review the reminders below:  We are located at [address] on the 2nd Floor.  Masks are required for everyone entering the building and during the entire visit.   At this time, we are not allowing visitors unless the patient requires assistance.   Please remember to bring your current insurance information, and any copay that is required.  Please arrive at least 15 minutes early.   I have linked the Covid-19 screening questionnaire for you to fill out prior to your appointment with [place].   If you are feeling any symptoms or something changes, please reply to me or give our office a call at [phone] to discuss your appointment.   Thank you for choosing [facility]!  *If you are unable to come to your appointment, please call by 5:00pm the day before to avoid a $50 no-show fee.

## 2021-08-25 NOTE — Assessment & Plan Note (Signed)
Chronic Currently taking Crestor 5 mg daily Lab Results  Component Value Date   LDLCALC 92 06/01/2021   Increase Crestor to 10 mg daily Encouraged regular exercise, healthy diet

## 2021-08-25 NOTE — Assessment & Plan Note (Addendum)
Indication for chronic opioid: Cervicogenic headaches Medication and dose: Percocet 7.5-325 mg 1 tab every 12 as needed  also taking Lyrica 150 mg twice daily, topiramate 100 mg twice daily We will try adding Flexeril 5-10 mg at night Often she takes Tylenol for her pain and does not use the Percocet-uses this rarely It is difficult for her to tell what helps and does not help  Will continue current regimen.  She is taking her medication appropriately UDS today Pain contract signed today

## 2021-08-25 NOTE — Assessment & Plan Note (Signed)
Chronic Lab Results  Component Value Date   HGBA1C 5.6 06/01/2021   Sugars have been in the normal range Healthy diet and regular exercise stressed

## 2021-08-25 NOTE — Assessment & Plan Note (Signed)
Chronic GERD controlled Continue omeprazole 20 mg daily  

## 2021-08-25 NOTE — Assessment & Plan Note (Signed)
Chronic DEXA up-to-date Continue Fosamax 70 mg weekly-this was started 09/2019 and plan to continue for 5 years Continue calcium and vitamin D Stressed regular exercise

## 2021-08-25 NOTE — Assessment & Plan Note (Addendum)
Chronic BP well controlled and lower than needed Decrease metoprolol to 12.5 mg twice daily-May eventually be able to get her off the medication She will continue to monitor at home-discussed goal

## 2021-08-25 NOTE — Assessment & Plan Note (Signed)
Chronic Given aortic atherosclerosis Will increase Crestor to 10 mg daily Encouraged healthy diet and regular exercise

## 2021-08-25 NOTE — Assessment & Plan Note (Addendum)
Chronic Has seen pain management in the past and received injections, but no longer needs to see them Continue Lyrica 150 mg twice daily, Topamax 100 mg twice daily Continue Percocet 7.5-325 mg every 12 hours as needed - not taking often Will try flexeril 5-10 mg nightly prn to see if it helps with muscle component of her pain  UDS today Pain contract signed today

## 2021-08-28 LAB — PRESCRIBED DRUGS,MEDMATCH(R)

## 2021-08-28 LAB — DRUG MONITOR, TRAMADOL,QN, URINE
Desmethyltramadol: NEGATIVE ng/mL (ref <100)
Tramadol: NEGATIVE ng/mL (ref <100)

## 2021-08-28 LAB — DRUG MONITOR,AMPHETAMINE,W/CONF, URINE: Amphetamines: NEGATIVE ng/mL (ref <500)

## 2021-08-28 LAB — DRUG MONITOR, OXYCODONE,W/CONF, URINE: Oxycodone: NEGATIVE ng/mL (ref <100)

## 2021-08-28 LAB — DM TEMPLATE

## 2021-08-28 LAB — DRUG MONITOR, OPIATES,W/CONF, URINE: Opiates: NEGATIVE ng/mL (ref <100)

## 2021-08-28 LAB — DRUG MONITOR, BENZO,W/CONF, URINE: Benzodiazepines: NEGATIVE ng/mL (ref <100)

## 2021-08-28 LAB — DRUG MONITOR,BARBITURATE,W/CONF, URINE: Barbiturates: NEGATIVE ng/mL (ref <300)

## 2021-08-28 LAB — DRUG MONITOR, COCAINEMETAB, W/CONF, URINE: Cocaine Metabolite: NEGATIVE ng/mL (ref <150)

## 2021-08-30 ENCOUNTER — Other Ambulatory Visit: Payer: Self-pay | Admitting: Surgery

## 2021-08-30 LAB — SARS CORONAVIRUS 2 (TAT 6-24 HRS): SARS Coronavirus 2: NEGATIVE

## 2021-08-31 NOTE — Anesthesia Preprocedure Evaluation (Addendum)
Anesthesia Evaluation  Patient identified by MRN, date of birth, ID band Patient awake    Reviewed: Allergy & Precautions, NPO status , Patient's Chart, lab work & pertinent test results, reviewed documented beta blocker date and time   Airway Mallampati: IV  TM Distance: >3 FB Neck ROM: Limited    Dental no notable dental hx.    Pulmonary neg pulmonary ROS,    Pulmonary exam normal breath sounds clear to auscultation       Cardiovascular hypertension, Pt. on home beta blockers Normal cardiovascular exam Rhythm:Regular Rate:Normal  ECG: ST   Neuro/Psych  Headaches, negative psych ROS   GI/Hepatic Neg liver ROS, GERD  Medicated and Controlled,  Endo/Other  negative endocrine ROS  Renal/GU negative Renal ROS     Musculoskeletal  (+) Arthritis ,   Abdominal   Peds  Hematology  (+) anemia , HLD    Anesthesia Other Findings VENTRAL INCISIONAL RECURRENT AND INCARCERATED ABDOMINAL WALL HERNIA  Reproductive/Obstetrics                            Anesthesia Physical Anesthesia Plan  ASA: 3  Anesthesia Plan: General   Post-op Pain Management:    Induction: Intravenous  PONV Risk Score and Plan: 4 or greater and Ondansetron, Dexamethasone, Treatment may vary due to age or medical condition, Midazolam and Amisulpride  Airway Management Planned: Oral ETT and Video Laryngoscope Planned  Additional Equipment:   Intra-op Plan:   Post-operative Plan: Extubation in OR  Informed Consent: I have reviewed the patients History and Physical, chart, labs and discussed the procedure including the risks, benefits and alternatives for the proposed anesthesia with the patient or authorized representative who has indicated his/her understanding and acceptance.     Dental advisory given  Plan Discussed with: CRNA  Anesthesia Plan Comments:       Anesthesia Quick Evaluation

## 2021-09-01 ENCOUNTER — Inpatient Hospital Stay (HOSPITAL_COMMUNITY): Payer: Medicare Other | Admitting: Anesthesiology

## 2021-09-01 ENCOUNTER — Encounter (HOSPITAL_COMMUNITY): Payer: Self-pay | Admitting: Surgery

## 2021-09-01 ENCOUNTER — Other Ambulatory Visit (HOSPITAL_COMMUNITY): Payer: Self-pay

## 2021-09-01 ENCOUNTER — Inpatient Hospital Stay (HOSPITAL_COMMUNITY)
Admission: RE | Admit: 2021-09-01 | Discharge: 2021-09-02 | DRG: 355 | Disposition: A | Discharge status: Home / Self Care | Payer: Medicare Other | Attending: Surgery | Admitting: Surgery

## 2021-09-01 ENCOUNTER — Other Ambulatory Visit: Payer: Self-pay

## 2021-09-01 ENCOUNTER — Inpatient Hospital Stay (HOSPITAL_COMMUNITY): Payer: Medicare Other | Admitting: Physician Assistant

## 2021-09-01 ENCOUNTER — Encounter (HOSPITAL_COMMUNITY)
Admission: RE | Disposition: A | Discharge status: Home / Self Care | Payer: Self-pay | Source: Home / Self Care | Attending: Surgery

## 2021-09-01 DIAGNOSIS — M961 Postlaminectomy syndrome, not elsewhere classified: Secondary | ICD-10-CM | POA: Diagnosis present

## 2021-09-01 DIAGNOSIS — E785 Hyperlipidemia, unspecified: Secondary | ICD-10-CM | POA: Diagnosis not present

## 2021-09-01 DIAGNOSIS — K5720 Diverticulitis of large intestine with perforation and abscess without bleeding: Secondary | ICD-10-CM | POA: Diagnosis present

## 2021-09-01 DIAGNOSIS — G8929 Other chronic pain: Secondary | ICD-10-CM

## 2021-09-01 DIAGNOSIS — K219 Gastro-esophageal reflux disease without esophagitis: Secondary | ICD-10-CM | POA: Diagnosis not present

## 2021-09-01 DIAGNOSIS — Y838 Other surgical procedures as the cause of abnormal reaction of the patient, or of later complication, without mention of misadventure at the time of the procedure: Secondary | ICD-10-CM | POA: Diagnosis present

## 2021-09-01 DIAGNOSIS — R7303 Prediabetes: Secondary | ICD-10-CM | POA: Diagnosis present

## 2021-09-01 DIAGNOSIS — K660 Peritoneal adhesions (postprocedural) (postinfection): Secondary | ICD-10-CM | POA: Diagnosis present

## 2021-09-01 DIAGNOSIS — K432 Incisional hernia without obstruction or gangrene: Principal | ICD-10-CM | POA: Diagnosis present

## 2021-09-01 DIAGNOSIS — S31109A Unspecified open wound of abdominal wall, unspecified quadrant without penetration into peritoneal cavity, initial encounter: Secondary | ICD-10-CM | POA: Diagnosis present

## 2021-09-01 DIAGNOSIS — Z0289 Encounter for other administrative examinations: Secondary | ICD-10-CM

## 2021-09-01 DIAGNOSIS — Z433 Encounter for attention to colostomy: Secondary | ICD-10-CM | POA: Diagnosis present

## 2021-09-01 DIAGNOSIS — K435 Parastomal hernia without obstruction or  gangrene: Secondary | ICD-10-CM | POA: Diagnosis present

## 2021-09-01 DIAGNOSIS — I10 Essential (primary) hypertension: Secondary | ICD-10-CM | POA: Diagnosis present

## 2021-09-01 DIAGNOSIS — K430 Incisional hernia with obstruction, without gangrene: Secondary | ICD-10-CM | POA: Diagnosis present

## 2021-09-01 HISTORY — PX: INCISIONAL HERNIA REPAIR: SHX193

## 2021-09-01 HISTORY — PX: INSERTION OF MESH: SHX5868

## 2021-09-01 SURGERY — REPAIR, HERNIA, INCISIONAL
Anesthesia: General | Site: Abdomen

## 2021-09-01 MED ORDER — TOPIRAMATE 100 MG PO TABS
100.0000 mg | ORAL_TABLET | Freq: Two times a day (BID) | ORAL | Status: DC
  Administered 2021-09-01 – 2021-09-02 (×2): 100 mg via ORAL
  Filled 2021-09-01 (×2): qty 1

## 2021-09-01 MED ORDER — DIPHENHYDRAMINE HCL 50 MG/ML IJ SOLN: 12.5000 mg | Freq: Four times a day (QID) | INTRAMUSCULAR | Status: DC | PRN

## 2021-09-01 MED ORDER — POLYVINYL ALCOHOL 1.4 % OP SOLN
1.0000 [drp] | Freq: Three times a day (TID) | OPHTHALMIC | Status: DC | PRN
  Filled 2021-09-01: qty 15

## 2021-09-01 MED ORDER — BISACODYL 10 MG RE SUPP: 10.0000 mg | Freq: Every day | RECTAL | Status: DC | PRN

## 2021-09-01 MED ORDER — KETAMINE HCL 10 MG/ML IJ SOLN
INTRAMUSCULAR | Status: AC
  Filled 2021-09-01: qty 1

## 2021-09-01 MED ORDER — PROPOFOL 10 MG/ML IV BOLUS
INTRAVENOUS | Status: DC | PRN
  Administered 2021-09-01: 150 mg via INTRAVENOUS

## 2021-09-01 MED ORDER — PREGABALIN 75 MG PO CAPS
150.0000 mg | ORAL_CAPSULE | Freq: Two times a day (BID) | ORAL | Status: DC
  Administered 2021-09-01 – 2021-09-02 (×2): 150 mg via ORAL
  Filled 2021-09-01 (×2): qty 2

## 2021-09-01 MED ORDER — KETOROLAC TROMETHAMINE 30 MG/ML IJ SOLN
INTRAMUSCULAR | Status: DC | PRN
  Administered 2021-09-01: 15 mg via INTRAVENOUS

## 2021-09-01 MED ORDER — OXYCODONE HCL 5 MG PO TABS
5.0000 mg | ORAL_TABLET | ORAL | Status: DC | PRN
  Administered 2021-09-01 – 2021-09-02 (×3): 5 mg via ORAL
  Filled 2021-09-01 (×3): qty 1

## 2021-09-01 MED ORDER — CEFAZOLIN SODIUM-DEXTROSE 2-4 GM/100ML-% IV SOLN
2.0000 g | INTRAVENOUS | Status: AC
  Administered 2021-09-01: 2 g via INTRAVENOUS
  Filled 2021-09-01: qty 100

## 2021-09-01 MED ORDER — MAGIC MOUTHWASH
15.0000 mL | Freq: Four times a day (QID) | ORAL | Status: DC | PRN
  Filled 2021-09-01: qty 15

## 2021-09-01 MED ORDER — MENTHOL 3 MG MT LOZG
1.0000 | LOZENGE | OROMUCOSAL | Status: DC | PRN
Start: 2021-09-01 — End: 2021-09-02

## 2021-09-01 MED ORDER — METOPROLOL TARTRATE 12.5 MG HALF TABLET
12.5000 mg | ORAL_TABLET | Freq: Two times a day (BID) | ORAL | Status: DC
  Administered 2021-09-01 – 2021-09-02 (×2): 12.5 mg via ORAL
  Filled 2021-09-01 (×2): qty 1

## 2021-09-01 MED ORDER — PROPOFOL 10 MG/ML IV BOLUS
INTRAVENOUS | Status: AC
  Filled 2021-09-01: qty 40

## 2021-09-01 MED ORDER — 0.9 % SODIUM CHLORIDE (POUR BTL) OPTIME
TOPICAL | Status: DC | PRN
  Administered 2021-09-01: 1000 mL

## 2021-09-01 MED ORDER — METOPROLOL TARTRATE 5 MG/5ML IV SOLN
5.0000 mg | Freq: Four times a day (QID) | INTRAVENOUS | Status: DC | PRN
Start: 2021-09-01 — End: 2021-09-02

## 2021-09-01 MED ORDER — FENTANYL CITRATE (PF) 250 MCG/5ML IJ SOLN
INTRAMUSCULAR | Status: DC | PRN
  Administered 2021-09-01: 50 ug via INTRAVENOUS
  Administered 2021-09-01: 25 ug via INTRAVENOUS

## 2021-09-01 MED ORDER — ORAL CARE MOUTH RINSE: 15.0000 mL | Freq: Once | OROMUCOSAL | Status: AC

## 2021-09-01 MED ORDER — LORATADINE 10 MG PO TABS
10.0000 mg | ORAL_TABLET | Freq: Every day | ORAL | Status: DC
  Administered 2021-09-02: 10 mg via ORAL
  Filled 2021-09-01: qty 1

## 2021-09-01 MED ORDER — ROCURONIUM BROMIDE 10 MG/ML (PF) SYRINGE
PREFILLED_SYRINGE | INTRAVENOUS | Status: DC | PRN
  Administered 2021-09-01 (×5): 20 mg via INTRAVENOUS
  Administered 2021-09-01: 40 mg via INTRAVENOUS

## 2021-09-01 MED ORDER — EPHEDRINE 5 MG/ML INJ
INTRAVENOUS | Status: AC
  Filled 2021-09-01: qty 10

## 2021-09-01 MED ORDER — LACTATED RINGERS IV SOLN
INTRAVENOUS | Status: DC | PRN
Start: 2021-09-01 — End: 2021-09-01

## 2021-09-01 MED ORDER — LACTATED RINGERS IV SOLN: INTRAVENOUS | Status: AC

## 2021-09-01 MED ORDER — CHLORHEXIDINE GLUCONATE CLOTH 2 % EX PADS: 6.0000 | MEDICATED_PAD | Freq: Once | CUTANEOUS | Status: DC

## 2021-09-01 MED ORDER — PHENYLEPHRINE HCL-NACL 20-0.9 MG/250ML-% IV SOLN
INTRAVENOUS | Status: DC | PRN
  Administered 2021-09-01: 20 ug/min via INTRAVENOUS

## 2021-09-01 MED ORDER — SUGAMMADEX SODIUM 200 MG/2ML IV SOLN
INTRAVENOUS | Status: DC | PRN
  Administered 2021-09-01: 120 mg via INTRAVENOUS

## 2021-09-01 MED ORDER — ONDANSETRON HCL 4 MG/2ML IJ SOLN
INTRAMUSCULAR | Status: AC
  Filled 2021-09-01: qty 2

## 2021-09-01 MED ORDER — CHLORHEXIDINE GLUCONATE 0.12 % MT SOLN
15.0000 mL | Freq: Once | OROMUCOSAL | Status: AC
  Administered 2021-09-01: 15 mL via OROMUCOSAL

## 2021-09-01 MED ORDER — PHENYLEPHRINE 40 MCG/ML (10ML) SYRINGE FOR IV PUSH (FOR BLOOD PRESSURE SUPPORT)
PREFILLED_SYRINGE | INTRAVENOUS | Status: AC
  Filled 2021-09-01: qty 10

## 2021-09-01 MED ORDER — ACETAMINOPHEN 500 MG PO TABS
1000.0000 mg | ORAL_TABLET | ORAL | Status: AC
  Administered 2021-09-01: 1000 mg via ORAL
  Filled 2021-09-01: qty 2

## 2021-09-01 MED ORDER — SODIUM CHLORIDE 0.9 % IV SOLN: Freq: Three times a day (TID) | INTRAVENOUS | Status: DC | PRN

## 2021-09-01 MED ORDER — OXYCODONE HCL 5 MG PO TABS
5.0000 mg | ORAL_TABLET | Freq: Four times a day (QID) | ORAL | 0 refills | Status: DC | PRN
  Filled 2021-09-01 (×2): qty 30, 4d supply, fill #0

## 2021-09-01 MED ORDER — MAGNESIUM HYDROXIDE 400 MG/5ML PO SUSP: 30.0000 mL | Freq: Every day | ORAL | Status: DC | PRN

## 2021-09-01 MED ORDER — BUPIVACAINE LIPOSOME 1.3 % IJ SUSP
20.0000 mL | Freq: Once | INTRAMUSCULAR | Status: AC
  Administered 2021-09-01: 20 mL

## 2021-09-01 MED ORDER — SIMETHICONE 80 MG PO CHEW: 40.0000 mg | CHEWABLE_TABLET | Freq: Four times a day (QID) | ORAL | Status: DC | PRN

## 2021-09-01 MED ORDER — LIDOCAINE 2% (20 MG/ML) 5 ML SYRINGE
INTRAMUSCULAR | Status: DC | PRN
Start: 2021-09-01 — End: 2021-09-01
  Administered 2021-09-01: 1.5 mg/kg/h via INTRAVENOUS

## 2021-09-01 MED ORDER — HYDROMORPHONE HCL 1 MG/ML IJ SOLN
0.5000 mg | INTRAMUSCULAR | Status: DC | PRN
  Administered 2021-09-01: 1 mg via INTRAVENOUS
  Filled 2021-09-01: qty 1

## 2021-09-01 MED ORDER — ONDANSETRON HCL 4 MG/2ML IJ SOLN
4.0000 mg | Freq: Once | INTRAMUSCULAR | Status: DC | PRN
  Administered 2021-09-01: 4 mg via INTRAVENOUS

## 2021-09-01 MED ORDER — AMISULPRIDE (ANTIEMETIC) 5 MG/2ML IV SOLN: 10.0000 mg | Freq: Once | INTRAVENOUS | Status: DC | PRN

## 2021-09-01 MED ORDER — GABAPENTIN 300 MG PO CAPS
300.0000 mg | ORAL_CAPSULE | ORAL | Status: AC
  Administered 2021-09-01: 300 mg via ORAL
  Filled 2021-09-01: qty 1

## 2021-09-01 MED ORDER — DEXAMETHASONE SODIUM PHOSPHATE 10 MG/ML IJ SOLN
INTRAMUSCULAR | Status: DC | PRN
  Administered 2021-09-01: 4 mg via INTRAVENOUS

## 2021-09-01 MED ORDER — LIDOCAINE 2% (20 MG/ML) 5 ML SYRINGE
INTRAMUSCULAR | Status: AC
  Filled 2021-09-01: qty 5

## 2021-09-01 MED ORDER — ENSURE PRE-SURGERY PO LIQD
296.0000 mL | Freq: Once | ORAL | Status: DC
  Filled 2021-09-01: qty 296

## 2021-09-01 MED ORDER — LACTATED RINGERS IV SOLN: INTRAVENOUS | Status: DC

## 2021-09-01 MED ORDER — ROCURONIUM BROMIDE 10 MG/ML (PF) SYRINGE
PREFILLED_SYRINGE | INTRAVENOUS | Status: AC
  Filled 2021-09-01: qty 10

## 2021-09-01 MED ORDER — LIDOCAINE 5 % EX PTCH
1.0000 | MEDICATED_PATCH | Freq: Every day | CUTANEOUS | Status: DC | PRN
  Filled 2021-09-01: qty 1

## 2021-09-01 MED ORDER — ONDANSETRON HCL 4 MG/2ML IJ SOLN
INTRAMUSCULAR | Status: DC | PRN
  Administered 2021-09-01: 4 mg via INTRAVENOUS

## 2021-09-01 MED ORDER — FLUTICASONE PROPIONATE 50 MCG/ACT NA SUSP
1.0000 | Freq: Two times a day (BID) | NASAL | Status: DC | PRN
  Filled 2021-09-01: qty 16

## 2021-09-01 MED ORDER — TRIAMCINOLONE ACETONIDE 55 MCG/ACT NA AERO
2.0000 | INHALATION_SPRAY | Freq: Every day | NASAL | Status: DC | PRN
  Filled 2021-09-01: qty 21.6

## 2021-09-01 MED ORDER — PHENOL 1.4 % MT LIQD: 2.0000 | OROMUCOSAL | Status: DC | PRN

## 2021-09-01 MED ORDER — FENTANYL CITRATE PF 50 MCG/ML IJ SOSY
25.0000 ug | PREFILLED_SYRINGE | INTRAMUSCULAR | Status: DC | PRN
  Administered 2021-09-01 (×2): 25 ug via INTRAVENOUS

## 2021-09-01 MED ORDER — MIDAZOLAM HCL 2 MG/2ML IJ SOLN
INTRAMUSCULAR | Status: DC | PRN
  Administered 2021-09-01 (×2): 1 mg via INTRAVENOUS

## 2021-09-01 MED ORDER — CYCLOBENZAPRINE HCL 5 MG PO TABS: 5.0000 mg | ORAL_TABLET | Freq: Every evening | ORAL | Status: DC | PRN

## 2021-09-01 MED ORDER — KETAMINE HCL 10 MG/ML IJ SOLN
INTRAMUSCULAR | Status: DC | PRN
  Administered 2021-09-01: 20 mg via INTRAVENOUS

## 2021-09-01 MED ORDER — BUPIVACAINE-EPINEPHRINE 0.25% -1:200000 IJ SOLN
INTRAMUSCULAR | Status: DC | PRN
  Administered 2021-09-01: 50 mL

## 2021-09-01 MED ORDER — EPHEDRINE SULFATE-NACL 50-0.9 MG/10ML-% IV SOSY
PREFILLED_SYRINGE | INTRAVENOUS | Status: DC | PRN
  Administered 2021-09-01 (×2): 10 mg via INTRAVENOUS
  Administered 2021-09-01 (×3): 5 mg via INTRAVENOUS

## 2021-09-01 MED ORDER — METHOCARBAMOL 1000 MG/10ML IJ SOLN
1000.0000 mg | Freq: Four times a day (QID) | INTRAVENOUS | Status: DC | PRN
  Filled 2021-09-01: qty 10

## 2021-09-01 MED ORDER — BUPIVACAINE LIPOSOME 1.3 % IJ SUSP
INTRAMUSCULAR | Status: AC
  Filled 2021-09-01: qty 20

## 2021-09-01 MED ORDER — ENSURE PRE-SURGERY PO LIQD
592.0000 mL | Freq: Once | ORAL | Status: DC
  Filled 2021-09-01: qty 592

## 2021-09-01 MED ORDER — ADULT MULTIVITAMIN W/MINERALS CH
1.0000 | ORAL_TABLET | Freq: Every morning | ORAL | Status: DC
  Administered 2021-09-02: 1 via ORAL
  Filled 2021-09-01: qty 1

## 2021-09-01 MED ORDER — FENTANYL CITRATE PF 50 MCG/ML IJ SOSY
PREFILLED_SYRINGE | INTRAMUSCULAR | Status: AC
  Filled 2021-09-01: qty 1

## 2021-09-01 MED ORDER — LIP MEDEX EX OINT
1.0000 "application " | TOPICAL_OINTMENT | Freq: Two times a day (BID) | CUTANEOUS | Status: DC
  Administered 2021-09-01 – 2021-09-02 (×3): 1 via TOPICAL
  Filled 2021-09-01: qty 7

## 2021-09-01 MED ORDER — LIDOCAINE 2% (20 MG/ML) 5 ML SYRINGE
INTRAMUSCULAR | Status: AC
  Filled 2021-09-01: qty 15

## 2021-09-01 MED ORDER — DEXAMETHASONE SODIUM PHOSPHATE 10 MG/ML IJ SOLN
INTRAMUSCULAR | Status: AC
  Filled 2021-09-01: qty 1

## 2021-09-01 MED ORDER — ALUM & MAG HYDROXIDE-SIMETH 200-200-20 MG/5ML PO SUSP: 30.0000 mL | Freq: Four times a day (QID) | ORAL | Status: DC | PRN

## 2021-09-01 MED ORDER — DIPHENHYDRAMINE HCL 12.5 MG/5ML PO ELIX: 12.5000 mg | ORAL_SOLUTION | Freq: Four times a day (QID) | ORAL | Status: DC | PRN

## 2021-09-01 MED ORDER — ACETAMINOPHEN 500 MG PO TABS
1000.0000 mg | ORAL_TABLET | Freq: Four times a day (QID) | ORAL | Status: DC
  Administered 2021-09-01 – 2021-09-02 (×4): 1000 mg via ORAL
  Filled 2021-09-01 (×4): qty 2

## 2021-09-01 MED ORDER — MIDAZOLAM HCL 2 MG/2ML IJ SOLN
INTRAMUSCULAR | Status: AC
  Filled 2021-09-01: qty 2

## 2021-09-01 MED ORDER — FENTANYL CITRATE (PF) 100 MCG/2ML IJ SOLN
INTRAMUSCULAR | Status: AC
  Filled 2021-09-01: qty 2

## 2021-09-01 MED ORDER — PANTOPRAZOLE SODIUM 40 MG PO TBEC
40.0000 mg | DELAYED_RELEASE_TABLET | Freq: Every day | ORAL | Status: DC
  Administered 2021-09-02: 40 mg via ORAL
  Filled 2021-09-01: qty 1

## 2021-09-01 MED ORDER — TRAMADOL HCL 50 MG PO TABS
50.0000 mg | ORAL_TABLET | Freq: Four times a day (QID) | ORAL | Status: DC | PRN
  Administered 2021-09-02: 50 mg via ORAL
  Filled 2021-09-01 (×2): qty 1

## 2021-09-01 MED ORDER — BUPIVACAINE-EPINEPHRINE 0.25% -1:200000 IJ SOLN
INTRAMUSCULAR | Status: AC
  Filled 2021-09-01: qty 1

## 2021-09-01 MED ORDER — PHENYLEPHRINE 40 MCG/ML (10ML) SYRINGE FOR IV PUSH (FOR BLOOD PRESSURE SUPPORT)
PREFILLED_SYRINGE | INTRAVENOUS | Status: DC | PRN
  Administered 2021-09-01: 40 ug via INTRAVENOUS

## 2021-09-01 MED ORDER — ENOXAPARIN SODIUM 40 MG/0.4ML IJ SOSY
40.0000 mg | PREFILLED_SYRINGE | INTRAMUSCULAR | Status: DC
  Administered 2021-09-02: 40 mg via SUBCUTANEOUS
  Filled 2021-09-01: qty 0.4

## 2021-09-01 MED ORDER — ROSUVASTATIN CALCIUM 10 MG PO TABS
10.0000 mg | ORAL_TABLET | Freq: Every day | ORAL | Status: DC
  Administered 2021-09-02: 10 mg via ORAL
  Filled 2021-09-01: qty 1

## 2021-09-01 SURGICAL SUPPLY — 41 items
BAG COUNTER SPONGE SURGICOUNT (BAG) ×1 IMPLANT
BINDER ABDOMINAL 12 ML 46-62 (SOFTGOODS) ×1 IMPLANT
CANISTER WOUND CARE 500ML ATS (WOUND CARE) ×1 IMPLANT
CHLORAPREP W/TINT 26 (MISCELLANEOUS) ×3 IMPLANT
COVER SURGICAL LIGHT HANDLE (MISCELLANEOUS) ×3 IMPLANT
DECANTER SPIKE VIAL GLASS SM (MISCELLANEOUS) ×3 IMPLANT
DEVICE TROCAR PUNCTURE CLOSURE (ENDOMECHANICALS) ×1 IMPLANT
DRAIN CHANNEL 19F RND (DRAIN) ×1 IMPLANT
DRAPE LAPAROSCOPIC ABDOMINAL (DRAPES) ×3 IMPLANT
DRESSING PREVENA PLUS CUSTOM (GAUZE/BANDAGES/DRESSINGS) IMPLANT
DRSG PREVENA PLUS CUSTOM (GAUZE/BANDAGES/DRESSINGS) ×3
DRSG TEGADERM 2-3/8X2-3/4 SM (GAUZE/BANDAGES/DRESSINGS) IMPLANT
DRSG TEGADERM 4X4.75 (GAUZE/BANDAGES/DRESSINGS) ×1 IMPLANT
ELECT REM PT RETURN 15FT ADLT (MISCELLANEOUS) ×3 IMPLANT
EVACUATOR SILICONE 100CC (DRAIN) ×1 IMPLANT
GAUZE SPONGE 2X2 8PLY STRL LF (GAUZE/BANDAGES/DRESSINGS) IMPLANT
GLOVE SURG NEOPR MICRO LF SZ8 (GLOVE) ×3 IMPLANT
GLOVE SURG UNDER LTX SZ8 (GLOVE) ×3 IMPLANT
GOWN STRL REUS W/TWL XL LVL3 (GOWN DISPOSABLE) ×6 IMPLANT
KIT BASIN OR (CUSTOM PROCEDURE TRAY) ×3 IMPLANT
KIT DRSG PREVENA PLUS 7DAY 125 (MISCELLANEOUS) ×1 IMPLANT
KIT TURNOVER KIT A (KITS) ×3 IMPLANT
MARKER SKIN DUAL TIP RULER LAB (MISCELLANEOUS) ×3 IMPLANT
MESH PROLENE PML 12X12 (Mesh General) ×1 IMPLANT
PACK GENERAL/GYN (CUSTOM PROCEDURE TRAY) ×3 IMPLANT
PAD POSITIONING PINK XL (MISCELLANEOUS) ×3 IMPLANT
SPONGE GAUZE 2X2 STER 10/PKG (GAUZE/BANDAGES/DRESSINGS) ×1
SPONGE T-LAP 18X18 ~~LOC~~+RFID (SPONGE) ×3 IMPLANT
STRIP CLOSURE SKIN 1/2X4 (GAUZE/BANDAGES/DRESSINGS) ×1 IMPLANT
SUT ETHIBOND NAB CT1 #1 30IN (SUTURE) IMPLANT
SUT MNCRL AB 4-0 PS2 18 (SUTURE) ×3 IMPLANT
SUT NOVA NAB DX-16 0-1 5-0 T12 (SUTURE) IMPLANT
SUT PDS AB 1 TP1 54 (SUTURE) ×1 IMPLANT
SUT PDS AB 1 TP1 96 (SUTURE) ×1 IMPLANT
SUT PDS AB 2-0 CT2 27 (SUTURE) ×2 IMPLANT
SUT PROLENE 1 CT 1 30 (SUTURE) ×6 IMPLANT
SUT PROLENE 2 0 CT2 30 (SUTURE) ×1 IMPLANT
SUT VIC AB 2-0 CT1 18 (SUTURE) ×2 IMPLANT
SYR 20ML LL LF (SYRINGE) ×3 IMPLANT
TOWEL OR 17X26 10 PK STRL BLUE (TOWEL DISPOSABLE) ×3 IMPLANT
TOWEL OR NON WOVEN STRL DISP B (DISPOSABLE) ×3 IMPLANT

## 2021-09-01 NOTE — Op Note (Signed)
09/01/2021  11:10 AM  PATIENT:  Chloe Young  70 y.o. female  Patient Care Team: Binnie Rail, MD as PCP - General (Internal Medicine) Charlton Haws, [facility] as Pharmacist (Pharmacist) [name], MD as Consulting Physician (Gastroenterology) Stark Klein, MD as Consulting Physician (General Surgery) [name], MD as Consulting Physician (Colon and Rectal Surgery) [name], MD as Consulting Physician (Cardiology)  PRE-OPERATIVE DIAGNOSIS:  RECURRENT INCISIONAL ABDOMINAL WALL HERNIA  POST-OPERATIVE DIAGNOSIS:  RECURRENT INCISIONAL ABDOMINAL WALL HERNIA  PROCEDURE:   COMPONENT SEPARATION - BILATERAL TRANSVERSUS ABDOMINUS RELEASE (TAR) INCISIONAL HERNIA REPAIR WITH MESH PANNICULECTOMY TRANSVERSUS ABDOMINIS PLANE (TAP) BLOCK - BILATERAL PLACEMENT PREVENA WOUND VAC  INSERTION OF MESH  SURGEON:  [name], MD  ASSISTANT: Ralene Ok, MD, FACS    ANESTHESIA:   general  Regional TRANSVERSUS ABDOMINIS PLANE (TAP) nerve block for perioperative & postoperative pain control provided with liposomal bupivacaine (Experel) mixed with 0.25% bupivacaine as a Bilateral TAP block x 3m each side at the level of the transverse abdominis & preperitoneal spaces along the flank at the anterior axillary line, from subcostal ridge to iliac crest under laparoscopic guidance    EBL:  Total I/O In: 400 [I.V.:400] Out: 600 [Urine:450; Blood:150].  See anesthesia record  Delay start of Pharmacological VTE agent (>24hrs) due to surgical blood loss or risk of bleeding:  no  DRAINS: [place] drain comes from the left upper quadrant incision and rest over the mesh in the retrorectus space  SPECIMEN: Panniculus and hernia sac (not sent)  DISPOSITION OF SPECIMEN:  N/A  COUNTS:  YES  PLAN OF CARE: Admit to inpatient   PATIENT DISPOSITION:  PACU - hemodynamically stable.  INDICATION: Pleasant patient that required emergent Hartman resection for massive  colon perforation with peritonitis and shock.  Prolonged hospital stay with rehabilitation.  Stabilized.  Underwent robotically assisted lyse adhesions and takedown of colostomy in March 2022.  Moderate diastases with probable large incisional hernia noted.  Chronic midline wound recovered from that but had worsening swelling and distention with large hernia sac within panniculus.  Worsening scar breakdown and chronic wound not healed she desired hernia repair.  Cleared from medical standpoint.  Given massive defect and chronic skin recommended open lyse adhesions with probable component separation and mesh repair  The anatomy & physiology of the abdominal wall was discussed.  The pathophysiology of hernias was discussed.  Natural history risks without surgery including progeressive enlargement, pain, incarceration, & strangulation was discussed.   Contributors to complications such as smoking, obesity, diabetes, prior surgery, etc were discussed.   I feel the risks of no intervention will lead to serious problems that outweigh the operative risks; therefore, I recommended surgery to reduce and repair the hernia.  I explained laparoscopic techniques with possible need for an open approach.  I noted the probable use of mesh to patch and/or buttress the hernia repair  Risks such as bleeding, infection, abscess, need for further treatment, injury to other organs, need for repair of tissues / organs, stroke, heart attack, death, and other risks were discussed.  I noted a good likelihood this will help address the problem.   Goals of post-operative recovery were discussed as well.  Possibility that this will not correct all symptoms was explained.  I stressed the importance of low-impact activity, aggressive pain control, avoiding constipation, & not pushing through pain to minimize risk of post-operative chronic pain or injury. Possibility of reherniation especially with smoking, obesity, diabetes,  immunosuppression,  and other health conditions was discussed.  We will work to minimize complications.     An educational handout further explaining the pathology & treatment options was given as well.  Questions were answered.  The patient expresses understanding & wishes to proceed with surgery.   OR FINDINGS:   Patient had a 18 x 8 cm mainly periumbilical region of incisional hernia with large stellate scar and chronic 6 x 5 cm wound of subcutaneous tissue only.  Omentum adherent to this.  Intra-abdominal adhesions not major aside from omentum to pelvis with internal hernia creation.  No obstruction   Type of repair - Component separation repair.  Bilateral.  Transversus abdominis release    Name of mesh - Prolene   Size of mesh - Height 30 cm, Width 27 cm  Orientation:  Transverse   Mesh overlap - 5-10 cm  Placement of mesh - retrorectus underlay repair   DESCRIPTION:   Informed consent was confirmed. The patient underwent general anaesthesia without difficulty. The patient was positioned appropriately. VTE prevention in place. The patient's abdomen was clipped, prepped, & draped in a sterile fashion. Surgical timeout confirmed our plan.   The patient was positioned in reverse Trendelenburg. Abdominal entry was gained through a midline incision.  We started in the epigastric region above the initial scar and excised old scar and chronic subcutaneous tissue.  Medially encountered a large subcutaneous hernia sac.  There were a few omental adhesions to the midline scar but no small bowel or colon.  Came through that.  We freed off some omental adhesions to the anterior abdominal wall periumbilically as well as a few down to the pelvis.  This was of omentum.  No major interloop adhesions of small bowel or colon from prior robotic lysis of adhesions and  colostomy takedown.  Because the fascia would not come together, I then proceeded to do component separation.  We feed off the  retrorectus fascia off the posterior rectus on the left side.    I then released the transversus abdominis fascia off the anterior abdominal wall  just lateral to the rectus abdominous muscle to come in to that layer of the abdominal wall.  Abdominal wall layers were quite thin but we are able to carefully preserve peritoneum and the release.  With this we could release towards the posterior axillary line on the left side.  When able to free from the left pelvis up to the subcostal region and floating ribs.  Fortunately it would not bring it to midline so we did a component separation on the right side in a similar fashion.  Transversus abdominis fascia and muscle released and mobilized to the posterior axillary line freed the bladder down as well as peritoneum off the inguinal and femoral regions to good result.  Had to release round ligaments on both sides.  Provided adequate release for posterior rectus and anterior to this fascia to come to the midline.  We did peritoneal repairs with 2-0 Vicryl interrupted suture.  There was not a lot of omentum but transverse colon came down well so that no small bowel was exposed.  We closed the peritoneum and retrorectus fascia vertically using 2-0 PDS in a running fashion.  Peritoneum rectal rectus fascia intact.  Therefore we do not feel the need for dual sided nor observable mesh but a standard polypropylene Prolene sheet of 30 x 30 cm mesh.  Ended up trimming it somewhat more narrow a few centimeters since she had a narrow torso.  Trimmed off some inferolateral wings but allow the most inferior aspect to go down into the true pelvis.  Laid well and snug but not overly tight.  To make sure that the mesh would be well secured laterally, I placed #1 Prolene sutures around the superior and lateral edges x8.  I then placed the mesh into the retrorectus transversus abdominis space.  I brought out the stitches along the lateral edges of the mesh at the level of the lateral  axillary line axillary line on both sides using a laparoscopic Endoclose suture passer such that there were three stitches laterally on each side.  Brought out tails of the sutures on the superior edge of the mesh in the epigastricregion 2.  Then put stitches through the mesh in the suprapubic region and brought them up over the fascia and to the superior pubic ramus right and left paramedian suprapubic.  With this, we had good mesh laying from lateral axillary line to either side laying snug but not overly tight nor overly loose.  We tied the fascial sutures down.  I placed a 21 [place] Blake drain through the left upper quadrant into this retrorectus space overlying the mesh..  I did reinspection. Hemostasis was good. Mesh laid well.  We then trimmed the edges of anterior rectus fascia hernia sac to we had a more durable fascia.  We then brought the midline anterior rectus fascia together using #1 looped running PDS suture to good result.    Patient had a lot of redundant skin from a giant hernia sac along with a panniculus on both side.  I decided to transect the panniculus and hernia sac transversely from lateral right axillary line to left lateral axillary line.  Came through skin and hernia sac.  Mobilized subcutaneous flaps.  This got rid of a lot of redundant tissue this get rid of most of the prior midline incision and sent for 4 cm in the epigastric region.  Reapproximated the flaps using 2-0 Vicryl interrupted deep dermal sutures starting the corners and meeting in the midline.  Close skin with 4 Monocryl running suture.  Closed the fascial puncture sites with Steri-Strips.  Drain secured with 2-0 Prolene and sterile dressing.  The transverse panniculectomy incision was covered with a Prevena skin wound VAC that held good seal.  We had already done an aggressive bilateral tap block and lateral blocks.  Patient is being extubated to go to the recovery room. I made an attempt to locate & reach the  desired patient contact to discuss the patient's overall status and my recommendations.  No one is available at this time.  My plans & instructions have been written in the chart.  [name], MD, FACS, MASCRS Esophageal, Gastrointestinal & Colorectal Surgery Robotic and Minimally Invasive Surgery  [facility], [place]  [place]. [address] [place], [place] [address] [phone] Fax [phone] Main

## 2021-09-01 NOTE — Interval H&P Note (Signed)
History and Physical Interval Note:  09/01/2021 7:10 AM  Chloe Young  has presented today for surgery, with the diagnosis of [place].  The various methods of treatment have been discussed with the patient and family. After consideration of risks, benefits and other options for treatment, the patient has consented to  Procedure(s) with comments: [place] (N/A) - GEN AND LOCAL INSERTION OF MESH (N/A) as a surgical intervention.  The patient's history has been reviewed, patient examined, no change in status, stable for surgery.  I have reviewed the patient's chart and labs.  Questions were answered to the patient's satisfaction.    I have re-reviewed the the patient's records, history, medications, and allergies.  I have re-examined the patient.  I again discussed intraoperative plans and goals of post-operative recovery.  The patient agrees to proceed.  Chloe Young  07/14/1951 [id number]  Patient Care Team: Binnie Rail, MD as PCP - General (Internal Medicine) Charlton Haws, [facility] as Pharmacist (Pharmacist) [name], MD as Consulting Physician (Gastroenterology) Stark Klein, MD as Consulting Physician (General Surgery) [name], MD as Consulting Physician (Colon and Rectal Surgery) [name], MD as Consulting Physician (Cardiology)  Patient Active Problem List   Diagnosis Date Noted   Aortic atherosclerosis ([place]) 08/25/2021   Encounter for chronic pain management 08/25/2021   Pain management contract signed  08/25/21 08/25/2021   ETD (Eustachian tube dysfunction), right 03/30/2021   Parastomal hernia s/p robotic colostomy takedown 03/23/2021 03/24/2021   Diverticulitis of large intestine with perforation and abscess s/p Hartmann/colostomy  11/08/2020 03/23/2021   Chronic nasal congestion 03/10/2020   Lumbar radiculopathy  06/15/2016   Cervicogenic headache 06/15/2016   Osteoporosis 06/15/2016   GERD (gastroesophageal reflux disease) 06/15/2016   Prediabetes 11/25/2013   Nonspecific abnormal electrocardiogram (ECG) (EKG) 05/28/2012   Cervical post-laminectomy syndrome 03/18/2012   Osteoarthritis of both knees 03/18/2012   Raynaud's syndrome 10/11/2010   NECK PAIN, CHRONIC 04/19/2010   Hyperlipidemia 04/19/2009   Essential hypertension 04/19/2009    Past Medical History:  Diagnosis Date   Cervical facet syndrome    Cervical post-laminectomy syndrome    Dr Tessa Ler[facility]   Cervicalgia    Chronic pain syndrome    Closed fracture of proximal end of right humerus 03/08/2021   Family history of adverse reaction to anesthesia    sister slow to wake up and nausea   GERD (gastroesophageal reflux disease)    Headache    due to neck fusion   Hyperlipidemia    joint pain with statin   Hypertension    Osteopenia 06/2016   T score -1.6 FRAX not calculated   Perforated abdominal viscus    Severe sepsis ([place]) 11/08/2020    Past Surgical History:  Procedure Laterality Date   CERVIAL FUSION  12/25/2004   Dr Arnoldo Morale, [place]   COLECTOMY WITH COLOSTOMY CREATION/HARTMANN PROCEDURE  11/08/2020   Procedure: SIGMOID COLECTOMY WITH COLOSTOMY CREATION/HARTMANN PROCEDURE;  Surgeon: Stark Klein, MD;  Location: [facility];  Service: General;;   COLONOSCOPY  12/25/2009    Dr Fuller Plan   COLOSTOMY REVERSAL     02/2021   CYSTOSCOPY N/A 03/23/2021   Procedure: CYSTOSCOPY WITH BILATERAL FIREFLY INJECTION;  Surgeon: Raynelle Bring, MD;  Location: [facility];  Service: Urology;  Laterality: N/A;   ESI  09/03/2013   L 5- S1 ; Dr Nelva Bush   JOINT REPLACEMENT  07/26/2011    RIGHT  KNEE ARTHROPLASTY   KNEE ARTHROSCOPY     bilaterally; Dr Tonita Cong   LAPAROTOMY N/A 11/08/2020   Procedure: EXPLORATORY LAPAROTOMY;  Surgeon: Stark Klein, MD;  Location: [facility];  Service: General;  Laterality: N/A;   TOTAL KNEE ARTHROPLASTY  01/11/2012    Procedure: TOTAL KNEE ARTHROPLASTY;  Surgeon: Johnn Hai, MD;  Location: [facility];  Service: Orthopedics;  Laterality: Left;  General with Femoral nerve block    Social History   Socioeconomic History   Marital status: Married    Spouse name: Not on file   Number of children: 1   Years of education: Not on file   Highest education level: Not on file  Occupational History   Occupation: HAIRDRESSER    Employer: [employer]  Tobacco Use   Smoking status: Never   Smokeless tobacco: Never  Vaping Use   Vaping Use: Never used  Substance and Sexual Activity   Alcohol use: Not Currently    Comment: none since 10/2020   Drug use: No   Sexual activity: Yes    Birth control/protection: Post-menopausal  Other Topics Concern   Not on file  Social History Narrative   Not on file   Social Determinants of Health   Financial Resource Strain: Not on file  Food Insecurity: Not on file  Transportation Needs: Not on file  Physical Activity: Not on file  Stress: Not on file  Social Connections: Not on file  Intimate Partner Violence: Not on file    Family History  Problem Relation Age of Onset   Diverticulitis Mother 8       with fissures.Marland Kitchenopted for no surgery   Cancer Father 31        malignant brain tumor   Breast cancer Maternal Aunt    Diabetes Maternal Grandfather    Heart failure Maternal Grandfather    Stroke Maternal Uncle        in 70s   Heart disease Maternal Uncle        X4 ; 1 had MI @ 23   Hypertension Sister    Diverticulitis Sister    Colon cancer Neg Hx    Colon polyps Neg Hx    Esophageal cancer Neg Hx    Rectal cancer Neg Hx    Stomach cancer Neg Hx     Medications Prior to Admission  Medication Sig Dispense Refill Last Dose   acetaminophen (TYLENOL) 500 MG tablet Take 500-1,000 mg by mouth every 6 (six) hours as needed (for pain.).   Past Month   alendronate (FOSAMAX) 70 MG tablet TAKE 1 TABLET BY MOUTH  EVERY 7 DAYS TAKE WITH A  FULL GLASS  OF WATER ON AN  EMPTY STOMACH (Patient taking differently: Take 70 mg by mouth every Thursday. TAKE 1 TABLET BY MOUTH  EVERY 7 DAYS TAKE WITH A  FULL GLASS OF WATER ON AN  EMPTY STOMACH) 12 tablet 3 08/31/2021   Calcium Carbonate-Vitamin D (CALCIUM + D PO) Take 1 tablet by mouth daily.   Past Month   carboxymethylcellul-glycerin (LUBRICANT DROPS/DUAL-ACTION) 0.5-0.9 % ophthalmic solution Place 1 drop into both eyes 3 (three) times daily as needed for dry eyes.   08/31/2021   cyclobenzaprine (FLEXERIL) 5 MG tablet Take 1-2 tablets (5-10 mg total) by mouth at bedtime as needed for muscle spasms. 30 tablet 2 Past Week   fexofenadine (ALLEGRA) 180 MG tablet Take 180 mg by mouth in the morning.   09/01/2021 at 0345   fluticasone (FLONASE) 50 MCG/ACT  nasal spray USE 1 SPRAY IN BOTH  NOSTRILS TWICE DAILY (Patient taking differently: Place 1 spray into both nostrils 2 (two) times daily as needed for allergies.) 48 g 1 08/31/2021   Lidocaine 4 % PTCH Place 1 patch onto the skin daily as needed (pain (neck)).   Past Week   LYRICA 150 MG capsule Take 1 capsule (150 mg total) by mouth 2 (two) times daily. 60 capsule 5 09/01/2021 at 0345   metoprolol tartrate (LOPRESSOR) 25 MG tablet Take 0.5 tablets (12.5 mg total) by mouth 2 (two) times daily. 180 tablet 3 09/01/2021 at 0345   omeprazole (PRILOSEC) 20 MG capsule TAKE 1 CAPSULE BY MOUTH  DAILY (Patient taking differently: Take 20 mg by mouth in the morning.) 90 capsule 3 09/01/2021 at 0345   rosuvastatin (CRESTOR) 10 MG tablet Take 1 tablet (10 mg total) by mouth daily. 90 tablet 3 08/31/2021   topiramate (TOPAMAX) 100 MG tablet Take 1 tablet (100 mg total) by mouth 2 (two) times daily. 180 tablet 1 09/01/2021 at 0345   triamcinolone (NASACORT) 55 MCG/ACT AERO nasal inhaler Place 2 sprays into the nose daily as needed (allergies).   08/31/2021   amoxicillin (AMOXIL) 500 MG capsule Take 2,000 mg by mouth See admin instructions. Take 4 capsules (2000 mg) by mouth 1 hour prior to dental  appointment   More than a month   Multiple Vitamin (MULITIVITAMIN WITH MINERALS) TABS Take 1 tablet by mouth in the morning.   More than a month   oxyCODONE-acetaminophen (PERCOCET) 7.5-325 MG tablet Take 1 tablet by mouth every 12 (twelve) hours as needed for severe pain (chronic cervicogenic headaches). 40 tablet 0 More than a month    Current Facility-Administered Medications  Medication Dose Route Frequency Provider Last Rate Last Admin   bupivacaine liposome (EXPAREL) 1.3 % injection 266 mg  20 mL Infiltration Once [name], MD       ceFAZolin (ANCEF) IVPB 2g/100 mL premix  2 g Intravenous On Call to OR [name], MD       Chlorhexidine Gluconate Cloth 2 % PADS 6 each  6 each Topical Once [name], MD       And   Chlorhexidine Gluconate Cloth 2 % PADS 6 each  6 each Topical Once [name], MD       Derrill Memo ON 09/02/2021] feeding supplement (ENSURE PRE-SURGERY) liquid 296 mL  296 mL Oral Once [name], MD       feeding supplement (ENSURE PRE-SURGERY) liquid 592 mL  592 mL Oral Once [name], MD       lactated ringers infusion   Intravenous Continuous Suzette Battiest, MD 10 mL/hr at 09/01/21 [id number] Continued from Pre-op at 09/01/21 0625     No Known Allergies  BP 127/73   Pulse 78   Temp 97.8 F (36.6 C) (Oral)   Resp 18   Ht '5\' 3"'$  (1.6 m)   Wt 54 kg   SpO2 100%   BMI 21.09 kg/m   Labs: No results found for this or any previous visit (from the past 48 hour(s)).  Imaging / Studies: No results found.   [name], M.D., F.A.C.S. Gastrointestinal and Minimally Invasive Surgery [facility] [address] [place], [place] [address] [phone] Main / Paging  09/01/2021 7:10 AM    [name]

## 2021-09-01 NOTE — Progress Notes (Signed)
I discussed operative findings, updated the patient's status, discussed probable steps to recovery, and gave postoperative recommendations to the patient's husband, Jeanet Toal .  Recommendations were made.  Questions were answered.  He expressed understanding & appreciation.

## 2021-09-01 NOTE — Discharge Instructions (Signed)
HERNIA REPAIR: POST OP INSTRUCTIONS  ######################################################################  EAT Gradually transition to a high fiber diet with a fiber supplement over the next few weeks after discharge.  Start with a pureed / full liquid diet (see below)  WALK Walk an hour a day.  Control your pain to do that.    CONTROL PAIN Control pain so that you can walk, sleep, tolerate sneezing/coughing, and go up/down stairs.  HAVE A BOWEL MOVEMENT DAILY Keep your bowels regular to avoid problems.  OK to try a laxative to override constipation.  OK to use an antidairrheal to slow down diarrhea.  Call if not better after 2 tries  CALL IF YOU HAVE PROBLEMS/CONCERNS Call if you are still struggling despite following these instructions. Call if you have concerns not answered by these instructions  ######################################################################    DIET: Follow a light bland diet & liquids the first 24 hours after arrival home, such as soup, liquids, starches, etc.  Be sure to drink plenty of fluids.  Quickly advance to a usual solid diet within a few days.  Avoid fast food or heavy meals as your are more likely to get nauseated or have irregular bowels.  A low-fat, high-fiber diet for the rest of your life is ideal.   Take your usually prescribed home medications unless otherwise directed.  PAIN CONTROL: Pain is best controlled by a usual combination of three different methods TOGETHER: Ice/Heat Over the counter pain medication Prescription pain medication Most patients will experience some swelling and bruising around the hernia(s) such as the bellybutton, groins, or old incisions.  Ice packs or heating pads (30-60 minutes up to 6 times a day) will help. Use ice for the first few days to help decrease swelling and bruising, then switch to heat to help relax tight/sore spots and speed recovery.  Some people prefer to use ice alone, heat alone, alternating  between ice & heat.  Experiment to what works for you.  Swelling and bruising can take several weeks to resolve.   It is helpful to take an over-the-counter pain medication regularly for the first few weeks.  Choose one of the following that works best for you: Naproxen (Aleve, etc)  Two 244m tabs twice a day Ibuprofen (Advil, etc) Three 2045mtabs four times a day (every meal & bedtime) Acetaminophen (Tylenol, etc) 325-65043mour times a day (every meal & bedtime) A  prescription for pain medication should be given to you upon discharge.  Take your pain medication as prescribed.  If you are having problems/concerns with the prescription medicine (does not control pain, nausea, vomiting, rash, itching, etc), please call us [phone] see if we need to switch you to a different pain medicine that will work better for you and/or control your side effect better. If you need a refill on your pain medication, please contact your pharmacy.  They will contact our office to request authorization. Prescriptions will not be filled after 5 pm or on week-ends.  Avoid getting constipated.  Between the surgery and the pain medications, it is common to experience some constipation.  Increasing fluid intake and taking a fiber supplement (such as Metamucil, Citrucel, FiberCon, MiraLax, etc) 1-2 times a day regularly will usually help prevent this problem from occurring.  A mild laxative (prune juice, Milk of Magnesia, MiraLax, etc) should be taken according to package directions if there are no bowel movements after 48 hours.    Wash / shower every day.  You may shower over the dressings  as they are waterproof.    Remove your waterproof bandages, skin tapes, and other bandages 3 days after surgery. You may replace a dressing/Band-Aid to cover the incision for comfort if you wish. You may leave the incisions open to air.  You may replace a dressing/Band-Aid to cover an incision for comfort if you wish.  Continue  to shower over incision(s) after the dressing is off.  ACTIVITIES as tolerated:   You may resume regular (light) daily activities beginning the next day--such as daily self-care, walking, climbing stairs--gradually increasing activities as tolerated.  Control your pain so that you can walk an hour a day.  If you can walk 30 minutes without difficulty, it is safe to try more intense activity such as jogging, treadmill, bicycling, low-impact aerobics, swimming, etc. Save the most intensive and strenuous activity for last such as sit-ups, heavy lifting, contact sports, etc  Refrain from any heavy lifting or straining until you are off narcotics for pain control.   DO NOT PUSH THROUGH PAIN.  Let pain be your guide: If it hurts to do something, don't do it.  Pain is your body warning you to avoid that activity for another week until the pain goes down. You may drive when you are no longer taking prescription pain medication, you can comfortably wear a seatbelt, and you can safely maneuver your car and apply brakes. You may have sexual intercourse when it is comfortable.   FOLLOW UP in our office Please call [facility] at [phone] to set up an appointment to see your surgeon in the office for a follow-up appointment approximately 2-3 weeks after your surgery. Make sure that you call for this appointment the day you arrive home to insure a convenient appointment time.  9.  If you have disability of FMLA / Family leave forms, please bring the forms to the office for processing.  (do not give to your surgeon).  WHEN TO CALL us [phone]: Poor pain control Reactions / problems with new medications (rash/itching, nausea, etc)  Fever over 101.5 F (38.5 C) Inability to urinate Nausea and/or vomiting Worsening swelling or bruising Continued bleeding from incision. Increased pain, redness, or drainage from the incision   The clinic staff is available to answer your questions during regular business  hours (8:30am-5pm).  Please don't hesitate to call and ask to speak to one of our nurses for clinical concerns.   If you have a medical emergency, go to the nearest emergency room or call 911.  A surgeon from [facility] is always on call at the hospitals in [facility], [place], [place], [place], [place]  [address] ?  [address] [address], [place], [place]   [address] MAIN: [id number] ? TOLL FREE: [phone] ? FAX: [phone] [email or web address]   #################################   DRAIN CARE:   You have a closed bulb drain to help you heal.    A bulb drain is a small, plastic reservoir which creates a gentle suction. It is used to remove excess fluid from a surgical wound. The color and amount of fluid will vary. Immediately after surgery, the fluid is bright red. It may gradually change to a yellow color. When the amount decreases to about 1 or 2 tablespoons (15 to 30 cc) per 24 hours, your caregiver will usually remove it.  JP Care  The Jackson-Pratt drainage system has flexible tubing attached to a soft, plastic bulb with a stopper. The drainage end of the  tubing, which is flat and white, goes into your body through a small opening near your incision (surgical cut). A stitch holds the drainage end in place. The rest of the tube is outside your body, attached to the bulb. When the bulb is compressed with the stopper in place, it creates a vacuum. This causes a constant gentle suction, which helps draw out fluid that collects under your incision. The bulb should be compressed at all times, except when you are emptying the drainage.  How long you will have your Jackson-Pratt depends on your surgery and the amount of fluid is draining. This is different for everyone. The Jackson-Pratt is usually removed when the drainage is 30 mL or less over 24 hours. To keep track of how much drainage you're having, you will record the  amount in a drainage log. It's important to bring the log with you to your follow-up appointments.  Caring for Your Jackson-Pratt at Home In order to care for your Jackson-Pratt at home, you or your caregiver will do the following:  Empty the drain once a day and record the color and amount of drainage  Care for the area where the tubing enters your skin by washing with soap and water.  Milk the tubing to help move clots into the bulb.  Do this before you empty and measure your drainage. Look in the mirror at the tubing. This will help you see where your hands need to be. Pinch the tubing close to where it goes into your skin between your thumb and forefinger. With the thumb and forefinger of your other hand, pinch the tubing right below your other fingers. Keep your fingers pinched and slide them down the tubing, pushing any clots down toward the bulb. You may want to use alcohol swabs to help you slide your fingers down the tubing. Repeat steps 3 and 4 as necessary to push clots from the tubing into the bulb. If you are not able to move a clot into the bulb, call your doctor's office. The fluid may leak around the insertion site if a clot is blocking the drainage flow. If there is fluid in the bulb and no leakage at the insertion site, the drain is working.  How to Empty Your Jackson-Pratt and Record the Drainage You will need to empty your Jackson-Pratt every day  Gather the following supplies:  Measuring container your nurse gave you Jackson-Pratt Drainage Record  Pen or pencil  Instructions Clean an area to work on. Clean your hands thoroughly. Unplug the stopper on top of your Jackson-Pratt. This will cause the bulb to expand. Do not touch the inside of the stopper or the inner area of the opening on the bulb. Turn your Jackson-Pratt upside down, gently squeeze the bulb, and pour the drainage into the measuring container. Turn your Jackson-Pratt right side up. Squeeze the bulb  until your fingers feel the palm of your hand. Keep squeezing the bulb while you replug the stopper. Make sure the bulb stays fully compressed to ensure constant, gentle suction.    Check the amount and color of drainage in the measuring container. The first couple days after surgery the fluid may be dark red. This is normal. As you heal the fluid may look pink or pale yellow. Record this amount and the color of drainage on your Jackson-Pratt Drainage Record. Flush the drainage down the toilet and rinse the measuring container with water.  Caring for the Insertion Site  Once you have emptied  the drainage, clean your hands again. Check the area around the insertion site. Look for tenderness, swelling, or pus. If you have any of these, or if you have a temperature of 101 F (38.3 C) or higher, you may have an infection. Call your doctor's office.  Sometimes, the drain causes redness the size of a dime at your insertion site. This is normal. Your healthcare provider will tell you if you should place a bandage over the insertion site.  Wash drain site with soap & water (dilute hydrogen peroxide PRN) daily & replace clean dressing / tape    DAILY CARE Keep the bulb compressed at all times, except while emptying it. The compression creates suction.  Keep sites where the tubes enter the skin dry and covered with a light bandage (dressing).  Tape the tubes to your skin, 1 to 2 inches below the insertion sites, to keep from pulling on your stitches. Tubes are stitched in place and will not slip out.  Pin the bulb to your shirt (not to your pants) with a safety pin.  For the first few days after surgery, there usually is more fluid in the bulb. Empty the bulb whenever it becomes half full because the bulb does not create enough suction if it is too full. Include this amount in your 24 hour totals.  When the amount of drainage decreases, empty the bulb at the same time every day. Write down the  amounts and the 24 hour totals. Your caregiver will want to know them. This helps your caregiver know when the tubes can be removed.  (We anticipate removing the drain in 1-3 weeks, depending on when the output is <28m a day for 2+ days) If there is drainage around the tube sites, change dressings and keep the area dry. If you see a clot in the tube, leave it alone. However, if the tube does not appear to be draining, let your caregiver know.  TO EMPTY THE BULB Open the stopper to release suction.  Holding the stopper out of the way, pour drainage into the measuring cup that was sent home with you.  Measure and write down the amount. If there are 2 bulbs, note the amount of drainage from bulb 1 or bulb 2 and keep the totals separate. Your caregiver will want to know which tube is draining more.  Compress the bulb by folding it in half.  Replace the stopper.  Check the tape that holds the tube to your skin, and pin the bulb to your shirt.  SEEK MEDICAL CARE IF: The drainage develops a bad odor.  You have an oral temperature above 102 F (38.9 C).  The amount of drainage from your wound suddenly increases or decreases.  You accidentally pull out your drain.  You have any other questions or concerns.  MAKE SURE YOU:  Understand these instructions.  Will watch your condition.  Will get help right away if you are not doing well or get worse.    Call our office if you have any questions about your drain. [phone]

## 2021-09-01 NOTE — Anesthesia Procedure Notes (Signed)
Procedure Name: Intubation Date/Time: 09/01/2021 7:38 AM Performed by: Eben Burow, CRNA Pre-anesthesia Checklist: Patient identified, Emergency Drugs available, Suction available, Patient being monitored and Timeout performed Patient Re-evaluated:Patient Re-evaluated prior to induction Oxygen Delivery Method: Circle system utilized Preoxygenation: Pre-oxygenation with 100% oxygen Induction Type: IV induction Ventilation: Mask ventilation without difficulty Laryngoscope Size: Glidescope and 3 Tube type: Oral Tube size: 7.0 mm Number of attempts: 1 Airway Equipment and Method: Stylet Placement Confirmation: ETT inserted through vocal cords under direct vision, positive ETCO2 and breath sounds checked- equal and bilateral Secured at: 20 cm Tube secured with: Tape Dental Injury: Teeth and Oropharynx as per pre-operative assessment  Comments: Glidescope 3 with view of vocal cords, atraumatic intubation.

## 2021-09-01 NOTE — Transfer of Care (Signed)
Immediate Anesthesia Transfer of Care Note  Patient: Chloe Young  Procedure(s) Performed: LYSIS OF ADHESIONS, COMPONENT SEPARATION, BILATERAL TAR RELEASE, INCISIONAL HERNIA REPAIR WITH MESH, TAP BLOCK BILATERAL, PLACEMENT PREVENA WOUND VAC, PANNICULECTOMY (Abdomen) INSERTION OF MESH  Patient Location: PACU  Anesthesia Type:General  Level of Consciousness: awake, alert  and patient cooperative  Airway & Oxygen Therapy: Patient Spontanous Breathing and Patient connected to face mask oxygen  Post-op Assessment: Report given to RN and Post -op Vital signs reviewed and stable  Post vital signs: Reviewed and stable  Last Vitals:  Vitals Value Taken Time  BP 88/51 09/01/21 1120  Temp    Pulse 63 09/01/21 1120  Resp 18 09/01/21 1120  SpO2 100 % 09/01/21 1120  Vitals shown include unvalidated device data.  Last Pain:  Vitals:   09/01/21 0602  TempSrc:   PainSc: 0-No pain         Complications: No notable events documented.

## 2021-09-01 NOTE — H&P (Signed)
09/01/2021    Chloe Young DOB: [birth year]   Patient Care Team: Binnie Rail, MD as PCP - General (Internal Medicine) Charlton Haws, [facility] as Pharmacist (Pharmacist) [name], MD as Consulting Physician (Gastroenterology) Stark Klein, MD as Consulting Physician (General Surgery) [name], MD as Consulting Physician (Colon and Rectal Surgery) [name], MD as Consulting Physician (Cardiology)  Marland Kitchen The patient returns s/p robotic lysis adhesions with colostomy takedown a rectosigmoid resection.  Primary parastomal hernia repair 03/23/2021       Patient returns for follow-up on her chronic low midline incisional wound.  Patient notes that she feels like it is getting larger.  Places a Telfa pad gauze over it.  Not draining as much as it used to but it still gets irritated.  He is having increasing bulging.  She is otherwise eating well.  No fevers or chills.  Does not smoke.  Does have some discomfort with the bulging.  Concerning.  No problems at her old colostomy site.  Feels like something needs to happen.   Ready for surgery    PRIOR NOTE:   The patient returns to clinic after surgery, gradually improving.  Pain from the incisions is fading away.  She still has her midline superficial wound.  That turned out to be just subcutaneous tissue and not small bowel was initially feared.  She is very thin down diastases in the region.  She was having some diarrhea on Benefiber twice a day and is backed off.  One-A-Day seems to be working better for her.   Appetite is good.  Energy level coming back.  Denies nausea, constipation/diarrhea, worsening fatigue, high fevers, or other concerns. ` ` ` The patient is a patient was found to have free air in her cart emergency surgery November 08 2020.  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  She came in some shock.  Was in the hospital for 2 weeks.   Had prolonged ileus and IV nutrition but eventually improved.  She was left with an open wound that is taken time to close down.  Home health stop seeing her so she's been continuing changing the dressing twice a day.  Doing little bit of saline on it.  Notices minimal yellow on the gauze.  She is back to walking her dog a mile and a half a day.  She is eating normal food.  Drinks a supplemental Ensure shake.  Energy level is improving.  She did lose about 15 pounds around the time of emergency surgery.  She empties a colostomy in the morning.  Sometimes feel a time.  She does get pain and discomfort below the ostomy.  Seems related which she has a hard bowel movement.  However does not stop her from walking.  She is not on a fiber supplement.  He does not smoke.  Husband is a smoker.  Apparently I operated on him as well.  She initially came in with some concern for some ST changes and weakness when she went to the emergency room.  She was seen by cardiology.  No evidence of MI.  Sinus tachycardia related to her peritonitis.  Had an echocardiogram 9 years ago that showed good function.  Did not have any post-Hartmann cardiac issues.   ###########################################   Pathology: SURGICAL PATHOLOGY CASE: [id number] PATIENT: [name] Surgical Pathology Report         Clinical History: Colostomy for colon resection, desire  for ostomy takedown (crm)         FINAL MICROSCOPIC DIAGNOSIS:   A. END COLOSTOMY: - Colostomy. - No evidence of malignancy.   B. FINAL DISTAL MARGIN: - Benign colon. - No evidence of malignancy.   C. COLON, RECTOSIGMOID, RESECTION: - Ulceration with granulation tissue and abscess. - No evidence of malignancy.   Sylas Twombly DESCRIPTION: A: The specimen is received fresh and consists of a portion of bowel, measuring 4.5 cm in length, with 2 open resection margins.  1 end displays a thin rim of tan-pink skin.  Mucosa is tan with normal folding, and the  wall averages 0.2 cm in thickness.  Representative sections are submitted in 1 cassette.   B: The specimen is received fresh and consists of a circular portion of tan-red soft tissue, measuring 1.9 x 1.9 x 1.5 cm.  Representative sections are submitted in 1 cassette.   C: The specimen is received fresh and consists of a small segment of bowel with surrounding adipose tissue, measuring 4.0 cm in length.  The specimen displays one stapled resection margin, and opposing end is grossly consistent with a blind ended pouch.  The blind end of the specimen displays embedded staples and blue suture material.  No mucosal lesions are grossly identified.  Representative sections are submitted in 3 cassettes. 1 = proximal stapled margin 2 and 3 = distal pouch Craig Staggers 03/23/2021)   Final Diagnosis performed by Claudette Laws, MD.   Electronically signed 03/24/2021 Technical component performed at [facility], [place] [address] [place], [place] [address]. Professional component performed at [employer]. [facility], [place] [address] [place], [place] [address]. Immunohistochemistry Technical component (if applicable) was performed at [facility] [address], [place], [place],  [address].   IMMUNOHISTOCHEMISTRY DISCLAIMER (if applicable): Some of these immunohistochemical stains may have been developed and the performance characteristics determine by [facility]. Some may not have been cleared or approved by the U.S. Food and Drug Administration. The FDA has determined that such clearance or approval is not necessary. This test is used for clinical purposes. It should not be regarded as investigational or for research. This laboratory is certified under the [place] (CLIA-88) as qualified to perform high complexity clinical laboratory testing.  The controls stained  appropriately. ` ###########################################`     03/23/2021   12:00 PM   PATIENT:  Chloe Young  70 y.o. female   Patient Care Team: Binnie Rail, MD as PCP - General (Internal Medicine) Charlton Haws, [facility] as Pharmacist (Pharmacist) [name], MD as Consulting Physician (Gastroenterology) Stark Klein, MD as Consulting Physician (General Surgery) [name], MD as Consulting Physician (Colon and Rectal Surgery) [name], MD as Consulting Physician (Cardiology)   PRE-OPERATIVE DIAGNOSIS:   COLOSTOMY FOR COLON RESECTION, DESIRE FOR OSTOMY TAKEDOWN PARASTOMAL HERNIA INCISIONAL HERNIA   POST-OPERATIVE DIAGNOSIS:   COLOSTOMY FOR COLON RESECTION, DESIRE FOR OSTOMY TAKEDOWN PARASTOMAL HERNIA INCISIONAL HERNIA   PROCEDURE: ROBOTIC COLOSTOMY TAKEDOWN ROBOTIC RECTOSIGMOID RESECTION & COLOSTOMY EXCISION ROBOTIC LYSIS OF ADHESIONS RIGID PROCTOSCOPY PRIMARY PARASTOMAL HERNIA REPAIR   SURGEON:  [name], MD   ASSISTANT: Leighton Ruff, MD, FACS. An experienced assistant was required given the standard of surgical care given the complexity of the case.  This assistant was needed for exposure, dissection, suction, tissue approximation, retraction, perception, etc.   ANESTHESIA:      General   Nerve block provided with liposomal bupivacaine (Experel)  mixed with 0.25% bupivacaine as a Bilateral TAP block x 26m each side at the level of the transverse abdominis & preperitoneal spaces along the flank at the anterior axillary line, from subcostal ridge to iliac crest under laparoscopic guidance   Local field block at port sites & extraction wound   EBL:  Total I/O In: 2600 [I.V.:2000; IV Piggyback:600] Out: 600 [Urine:500; Blood:100]   Delay start of Pharmacological VTE agent (>24hrs) due to surgical blood loss or risk of bleeding:  no   DRAINS: 19 Fr Blake drain goes to the pelvis   SPECIMEN:   RECTOSIGMOID COLON STUMP  (open end proximal) COLOSTOMY DISTAL ANASTOMOTIC RING (final distal margin)   DISPOSITION OF SPECIMEN:  PATHOLOGY   COUNTS:  YES   PLAN OF CARE: Admit to inpatient   PATIENT DISPOSITION:  PACU - hemodynamically stable.   INDICATION:     With colon perforation and shock requiring emergent open Hartmann resection with colostomy.  Prolonged hospital stay.  Eventually recovery.  Rehabilitation.  She is now 5 months out from surgery and desired colostomy takedown.  She is a parastomal hernia around her colostomy with occasional discomfort.  Some partial obstructive-like symptoms as well.  I offered robotic possible open lyse each incision colostomy takedown.   The anatomy & physiology of the digestive tract was discussed.  The pathophysiology was discussed.  Possibility of remaining with an ostomy permanently was discussed.  I offered ostomy takedown.  Laparoscopic & open techniques were discussed.   Risks such as bleeding, infection, abscess, leak, reoperation, possible re-ostomy, injury to other organs, need for repair of tissues / organs, need for further treatment, hernia, heart attack, death, and other risks were discussed.   I noted a good likelihood this will help address the problem.  Goals of post-operative recovery were discussed as well.  We will work to minimize complications.  Questions were answered.  The patient expresses understanding & wishes to proceed with surgery.   OR FINDINGS:   Patient had very dense adhesions of omentum to anterior abdominal wall.  Infraumbilical stretched out diastases suspicious for hernia.  No small bowel to the anterior abdominal wall.  Infraumbilical midline wound granulated and nearly closed.   Moderate parastomal hernia around left lower quadrant colostomy.  Resection done of distal colostomy.  Some distal sigmoid present.  Resection done to proximal rectum.  The anastomosis rests 9-10 cm from the anal verge by rigid proctoscopy.   Dense interloop  small bowel adhesions with numerous internal hernias and twisting and torsion suspicious for some chronic closed-loop and partial small bowel obstructions.  Lysed lesions done and released and improved.   CASE DATA:   Type of patient?: Elective [facility] Private Case   Status of Case? Elective Scheduled   Infection Present At Time Of Surgery (PATOS)?  NO   DESCRIPTION:   Informed consent was confirmed.  The patient underwent general anaesthesia without difficulty.  The patient was positioned appropriately.  VTE prevention in place.  Dr. BAlinda Moneywas able to do cystoscopy with firefly injections of both ureters for intraoperative identification.  Please see his separate note for details.  Patient was redraped and prepped.  Colostomy had been sutured with a pursestring.  The patient was clipped, prepped, & draped in a sterile fashion.  Surgical timeout confirmed our plan.   The patient was positioned in reverse Trendelenburg.  Abdominal entry was gained using Varess technique at the left subcostal ridge on the anterior abdominal wall.  No elevated EtCO2 noted.  Patient had episode of hypotension and bradycardia.  Port placed.  Capital peritoneum evacuated.  Anesthesia was able to give atropine and corrected monitor.  Heart rate and blood pressure recovered well.  Stable for 5 minutes.  Cleared by anesthesia to proceed with surgery.  Reinsufflation done without incident.  No hemodynamic nor cardiac dysrhythmias for the rest of the case.  Camera inspection revealed small scratch on liver without the Varess needle in it.  Soft clotted.  Varess needle removed.   Extra ports were carefully placed under direct laparoscopic visualization.   Upon entering the abdomen (organ space), I encountered .   I reflected the greater omentum and the upper abdomen the small bowel in the upper abdomen.  The patient was carefully positioned.  The Intuitive daVinci robot was docked with camera & instruments carefully placed.   The  patient had very dense adhesions but no major abscess.  Proceeded with robotic lyse adhesions to free the greater omentum off the anterior abdominal wall.  There was no small bowel adherent to the anterior abdominal wall hard arguing against the granulation tissue of the infraumbilical wound being small bowel serosa.  Eventually freed those off.  Then proceeded to free off moderate small bowel adhesions to the colostomy retroperitoneum and pelvis.  I freed off some interloop adhesions as well where bowel had been twisted upon itself and one segment somewhat trapped in a closed-loop situation without any ischemia.   Mobilize adhesions around the end colostomy going down the left lower quadrant and moderate parastomal hernia.  There was no incarcerated material in the hernia itself.  Mobilized the descending colon in a lateral medial fashion as well.  Then focused down the pelvis.  Could see rectosigmoid stump densely adherent to the uterus.  Carefully freed that off and untwisted it.  I put the main pedicle on tension.  I scored the base of peritoneum of the medial side of the mesentery of the elevated left colon from the ligament of Treitz to the mid rectum.   I elevated the sigmoid mesentery and entered into the retro-mesenteric plane. We were able to identify the left ureter and gonadal vessels. We kept those posterior within the retroperitoneum and elevated the left colon mesentery off that. I did isolate the inferior mesenteric artery (IMA) pedicle but did not ligate it yet.  I continued distally and got into the avascular plane posterior to the mesorectum, sparing the nervi ergentes.. This allowed me to help mobilize the rectum as well by freeing the mesorectum off the sacrum.  I stayed away from the right and left ureters.  I kept the lateral vascular pedicles to the rectum intact.  Transected through the thickened visceral peritoneum in the pelvis to straighten out the rectosigmoid colon.   I chose a  region of the proximal rectum that was distal to an obvious rectosigmoid diverticulum.  I transected through the mesorectum and skeletonized around the region.  I came back more proximally and transected through the superior hemorrhoidal pedicle close to the specimen side of the rectosigmoid.  I then proceeded to run the small bowel to assure no evidence of injury.  I started in the proximal ileum and ran more distally.  I freed off numerous interloop adhesions and twisted bowel until it came to the ileal cecal region.  I then began to run the small bowel more proximally, gradually freeing off interloop adhesions and untwisted the bowel until I came to the ligament of Treitz.  We ran and reinspected  more time to confirm no serosal injury or other abnormality.  Hemostasis was good.  Freed adhesions of the greater omentum off the retroperitoneum and small bowel and left colon.  It was somewhat thinned out but mostly intact.   We undocked the robot.  I made a circular incision around the squama dermal junction of the colostomy and came through the dermis.  Came into the hernia sac of the parastomal hernia around the colostomy and freed the colon from its adhesions to the anterior abdominal wall.  Eviscerated and had plenty of redundant colon.   Placed a wound protector. Came about 6 cm more proximally where the colon was not inflamed or irritated.  Created a window and transected the mesentery more distally.  I clamped the colon to this area using a reusable pursestringer device.  Passed a 2-0 Keith needle. I transected at the descending/sigmoid junction with a scalpel. I got healthy bleeding mucosa.  We sent the rectosigmoid colon specimen off to go to pathology.  We sized the colon orifice.  I chose a 31m EEA anvil stapler system.  I reinforced the prolene pursestring with interrupted silk suture.   We returned to the end colostomy with the anvil in the back into the peritoneal cavity and placed.  We redocked  the robot.  I transected across the proximal rectum using a robotic 60 mm green load stapler with 1 firing to good result.  Specimen removed.  The distal end of the remaining colon easily reached down to the rectal stump, therefore, splenic flexure mobilization was not needed. Dr TMarcello Mooresscrubbed down and did gentle anal dilation and advanced the EEA stapler up the rectal stump. The spike was brought out at the provimal end of the rectal stump under direct visualization.  I  attached the anvil of the proximal colon the spike of the stapler. Anvil was tightened down and held clamped for 60 seconds. The EEA stapler was fired and held clamped for 30 seconds. The stapler was released & removed. We noted 2 excellent anastomotic rings. Blue stitch is in the proximal ring.  Dr TMarcello Moores did rigid proctoscopy noted the anastomosis was at 9-10 cm from the anal verge consistent with the proximal rectum.  We irrigation of isotonic solution & held that for the pelvic air leak test .  The rectum was insufflated the rectum while clamping the colon proximal to that anastomosis.  There was a negative air leak test. There was no tension of mesentery or bowel at the anastomosis.   Tissues looked viable.  Ureters & bowel uninjured.  Did not encounter any major bleeding but she was somewhat oozy all around.  Placed a drain down the pelvis and secured to the skin with 2-0 Prolene suture.  The anastomosis looked healthy. Greater omentum positioned down into the pelvis to help protect the anastomosis.   Endoluminal gas was evacuated.  Ports & wound protector removed.  We changed gloves & redraped the patient per colon SSI prevention protocol.  We aspirated the antibiotic irrigation.  Hemostasis was good.  Sterile unused instruments were used from this point.  I closed the skin at the port sites using Monocryl stitch and sterile dressing.  I closed the parastomal hernia fascia vertically using running #1 PDS suture.  Mobilized some skin  facts and transected some redundant fat in dogears.  I closed the colostomy wound using interrupted 2-0 Vicryl deep subcu and deep dermal sutures.  I placed antibiotic-soaked wicks into the closure at the corners  x2.  I placed sterile dressings.      Patient is being extubated go to recovery room. I had discussed postop care with the patient in detail the office & in the holding area. Instructions are written. I discussed operative findings, updated the patient's status, discussed probable steps to recovery, and gave postoperative recommendations to the patient's son, Randall Hiss.  I then called & d/w her husband as well.   Recommendations were made.  Questions were answered.  They expressed understanding & appreciation.   [name], M.D., F.A.C.S. Gastrointestinal and Minimally Invasive Surgery [facility] [place], [place] [id number] [phone] Main / Paging     Problem List/Past Medical [name], MD; 05/30/2021 11:10 AM) S/P EXPLORATORY LAPAROTOMY (Z98.890)  COLOSTOMY CARE (Z43.3)  PERFORATION OF SIGMOID COLON DUE TO DIVERTICULITIS (K57.20)  PARASTOMAL HERNIA (K43.5)  COLOSTOMY IN PLACE (Z93.3)  PREOP COLON - ENCOUNTER FOR PREOPERATIVE EXAMINATION FOR GENERAL SURGICAL PROCEDURE (Z01.818)  WOUND, OPEN, ABDOMINAL WALL, ANTERIOR (S31.109A)  HX DIVERTICULITIS OF COLON - F/U PRN (Z87.19)    Past Surgical History [name], MD; 05/30/2021 11:10 AM) Knee Surgery  Bilateral.   Diagnostic Studies History [name], MD; 05/30/2021 11:10 AM) Mammogram  1-3 years ago Pap Smear  >5 years ago   Allergies Janeann Forehand, CNA; 05/30/2021 10:44 AM) No Known Drug Allergies  [12/20/2020]: No Known Allergies  [02/14/2021]: Allergies Reconciled    Medication History Janeann Forehand, CNA; 05/30/2021 10:44 AM) oxyCODONE HCl  ('5MG'$  Tablet, 1 (one) Oral every six hours, as needed, Taken starting 01/11/2021) Active. Methocarbamol  ('500MG'$  Tablet, 1 (one) Oral four times  daily, as needed, Taken starting 03/28/2021) Active. Neomycin Sulfate  ('500MG'$  Tablet, 2 (two) Oral SEE NOTE, Taken starting 02/14/2021) Active. (TAKE TWO TABLETS AT 2 PM, 3 PM, AND 10 PM THE DAY PRIOR TO SURGERY) Alendronate Sodium  ('70MG'$  Tablet, Oral) Active. Fluticasone Propionate  (50MCG/ACT Suspension, Nasal) Active. hydroCHLOROthiazide  (12.'5MG'$  Capsule, Oral) Active. Metoprolol Tartrate  ('50MG'$  Tablet, Oral) Active. Omeprazole  ('20MG'$  Capsule DR, Oral) Active. Rosuvastatin Calcium  ('5MG'$  Tablet, Oral) Active. Topiramate  ('100MG'$  Tablet, Oral) Active. Tylenol  ('500MG'$  Capsule, 1 (one) Oral) Active. Lyrica  ('150MG'$  Capsule, Oral) Active. Calcium Carbonate  (Oral) Specific strength unknown - Active. Multiple Vitamin  (1 (one) Oral) Active. Medications Reconciled    Social History [name], MD; 05/30/2021 11:10 AM) Alcohol use  Occasional alcohol use. Caffeine use  Carbonated beverages, Coffee, Tea. No drug use  Tobacco use  Never smoker.   Family History [name], MD; 05/30/2021 11:10 AM) Arthritis  Sister. Cancer  Father. Colon Polyps  Sister. Hypertension  Sister.   Pregnancy / Birth History [name], MD; 05/30/2021 11:10 AM) Age at menarche  67 years. Age of menopause  31-50 Gravida  1 Length (months) of breastfeeding  3-6 Maternal age  72-30 Para  1   Other Problems [name], MD; 05/30/2021 11:10 AM) Back Pain  Diverticulosis  High blood pressure  Hypercholesterolemia          Physical Exam   General: Pt awake/alert/oriented x4 in  no major acute distress Eyes: PERRL, normal EOM. Sclera nonicteric Neuro: CN II-XII intact w/o focal sensory/motor deficits. Lymph: No head/neck/groin lymphadenopathy Psych:  No delerium/psychosis/paranoia HENT: Normocephalic, Mucus membranes moist.  No thrush Neck: Supple, No tracheal deviation Chest: No pain.  Good respiratory excursion. CV:  Pulses intact.   Regular rhythm MS: Normal AROM mjr joints.  No  obvious deformity Ext:  SCDs  BLE.  No significant edema.  No cyanosis Skin: No petechiae / purpura    Abdomen Note:  Large superficial open wound on the lower, central abdomen - without erythema or drainage. 10 x 4 cm region. Looks larger to me. Mostly epithelialized but still a little bit of moisture. I can feel small bowel through it. Dermis is thickened and edematous. It partially puuls off but not fully off the small bowel.   Large midline abdominal hernia/diastases laxity.   Left lower quadrant old colostomy well-healed without any obvious hernia.       Assessment & Plan    INCISIONAL HERNIA OF ANTERIOR ABDOMINAL WALL WITHOUT OBSTRUCTION OR GANGRENE (K43.2) Impression: Large worsening midline incisional hernia in the setting of moderate diastases recti with midline skin breakdown   While it is trying to epithelialize it won't fully do it. Her chronic hernia sac as well as skin wound is gradually getting larger.   I think this requires operative repair.   Most likely this would be an open type of approach with a TAR component separation with open reconstruction and underlay mesh. Inpatient surgery. Lysis of adhesions. Excise chronic scar and wound in the midline. Try and use permanent mesh. May need Phasix if there is evidence of significant infection or contamination.   Ideally would wait 6 months from last surgery to allow adhesions to be minimal, nutrition to be optimal, risk of infection minimized. She is ready:  The anatomy & physiology of the abdominal wall was discussed.  The pathophysiology of hernias was discussed.  Natural history risks without surgery including progeressive enlargement, pain, incarceration, & strangulation was discussed.   Contributors to complications such as smoking, obesity, diabetes, prior surgery, etc were discussed.   I feel the risks of no intervention will lead to serious problems that outweigh the operative risks; therefore, I recommended surgery  to reduce and repair the hernia.  I explained laparoscopic techniques with possible need for an open approach.  I noted the probable use of mesh to patch and/or buttress the hernia repair  Risks such as bleeding, infection, abscess, need for further treatment, injury to other organs, need for repair of tissues / organs, stroke, heart attack, death, and other risks were discussed.  I noted a good likelihood this will help address the problem.   Goals of post-operative recovery were discussed as well.  Possibility that this will not correct all symptoms was explained.  I stressed the importance of low-impact activity, aggressive pain control, avoiding constipation, & not pushing through pain to minimize risk of post-operative chronic pain or injury. Possibility of reherniation especially with smoking, obesity, diabetes, immunosuppression, and other health conditions was discussed.  We will work to minimize complications.     An educational handout further explaining the pathology & treatment options was given as well.  Questions were answered.  The patient expresses understanding & wishes to proceed with surgery.      WOUND, OPEN, ABDOMINAL WALL, ANTERIOR (S31.109A) Impression: Chronic midline infraumbilical wound enlarging. Some epithelialization but not resolving well.   Continue dry gauze telfa over it. Consider wearing a binder with protective cloth over it to help hold it in and minimize pressure of it worsening.   Once I think she requires surgery to remove the redundant skin and chronic ulcer at the time of an open hernia repair since the hernia is getting larger and bothering her more. She agrees.     S/P COLOSTOMY TAKEDOWN [id number]) Impression: Recovering well status post colostomy takedown  PREOP - [place] - ENCOUNTER FOR PREOPERATIVE EXAMINATION FOR GENERAL SURGICAL PROCEDURE (Z01.818)   Current Plans You are being scheduled for surgery - Our schedulers will call you.    You should  hear from our office's scheduling department within 5 working days about the location, date, and time of surgery.  We try to make accommodations for patient's preferences in scheduling surgery, but sometimes the OR schedule or the surgeon's schedule prevents [place] from making those accommodations.   If you have not heard from our office [phone]) in 5 working days, call the office and ask for your surgeon's nurse.   If you have other questions about your diagnosis, plan, or surgery, call the office and ask for your surgeon's nurse.   Written instructions provided [facility] Consent - Hernia Repair - Ventral/Incisional/Umbilical (Lakera Viall): discussed with patient and provided information. Pt Education - [facility] Hernia Post-Op HCI (Stephen Turnbaugh): discussed with patient and provided information. Pt Education - [facility] Mesh education: discussed with patient and provided information.   HX DIVERTICULITIS OF COLON - F/U PRN (Z87.19)   Current Plans Consider follow up colonoscopy by your gastroenterologist, depending on your diagnosis.  Call your gastroenterologist for advice   If you had a colon or rectal cancer resected by surgery, you should strongly consider getting a colonoscopy by your gastroenterologict one year after the colon cancer was removed.  If it was a benign polyp that was removed by colon resection, consider follow-up colonoscopy in about 3 years.   Pt Education - [facility] Diverticular Disease ([place]) Pt Education - [facility] Good Bowel Health (Camelle Henkels)     [name], MD, FACS, MASCRS Esophageal, Gastrointestinal & Colorectal Surgery Robotic and Minimally Invasive Surgery [facility] [address] [place], [place] [address] [phone] Fax [phone] Main/Paging   CONTACT INFORMATION: Weekday (9AM-5PM) concerns: Call [facility] main office at [phone] Weeknight (5PM-9AM) or Weekend/Holiday concerns: Check [email or web address] for General Surgery [facility] coverage (Please, do not use  SecureChat as it is not reliable communication to operating surgeons for immediate patient care)

## 2021-09-01 NOTE — Anesthesia Postprocedure Evaluation (Signed)
Anesthesia Post Note  Patient: Chloe Young  Procedure(s) Performed: LYSIS OF ADHESIONS, COMPONENT SEPARATION, BILATERAL TAR RELEASE, INCISIONAL HERNIA REPAIR WITH MESH, TAP BLOCK BILATERAL, PLACEMENT PREVENA WOUND VAC, PANNICULECTOMY (Abdomen) INSERTION OF MESH     Patient location during evaluation: PACU Anesthesia Type: General Level of consciousness: awake and alert and oriented Pain management: pain level controlled Vital Signs Assessment: post-procedure vital signs reviewed and stable Respiratory status: spontaneous breathing, nonlabored ventilation and respiratory function stable Cardiovascular status: blood pressure returned to baseline and stable Postop Assessment: no apparent nausea or vomiting Anesthetic complications: no   No notable events documented.  Last Vitals:  Vitals:   09/01/21 1300 09/01/21 1315  BP: 96/66 (!) 96/58  Pulse: 61 63  Resp: 14 14  Temp:    SpO2: 100% 100%    Last Pain:  Vitals:   09/01/21 1315  TempSrc:   PainSc: Asleep                 Khristie Sak A.

## 2021-09-02 ENCOUNTER — Encounter (HOSPITAL_COMMUNITY): Payer: Self-pay | Admitting: Surgery

## 2021-09-02 ENCOUNTER — Other Ambulatory Visit (HOSPITAL_COMMUNITY): Payer: Self-pay

## 2021-09-02 DIAGNOSIS — G8929 Other chronic pain: Secondary | ICD-10-CM

## 2021-09-02 LAB — BASIC METABOLIC PANEL
Anion gap: 5 (ref 5–15)
BUN: 11 mg/dL (ref 8–23)
CO2: 23 mmol/L (ref 22–32)
Calcium: 8.7 mg/dL — ABNORMAL LOW (ref 8.9–10.3)
Chloride: 110 mmol/L (ref 98–111)
Creatinine, Ser: 0.63 mg/dL (ref 0.44–1.00)
GFR, Estimated: >60 mL/min (ref >60)
Glucose, Bld: 129 mg/dL — ABNORMAL HIGH (ref 70–99)
Potassium: 4.1 mmol/L (ref 3.5–5.1)
Sodium: 138 mmol/L (ref 135–145)

## 2021-09-02 LAB — CBC
HCT: 26.8 % — ABNORMAL LOW (ref 36.0–46.0)
Hemoglobin: 8.6 g/dL — ABNORMAL LOW (ref 12.0–15.0)
MCH: 28.3 pg (ref 26.0–34.0)
MCHC: 32.1 g/dL (ref 30.0–36.0)
MCV: 88.2 fL (ref 80.0–100.0)
Platelets: 191 10*3/uL (ref 150–400)
RBC: 3.04 MIL/uL — ABNORMAL LOW (ref 3.87–5.11)
RDW: 15.6 % — ABNORMAL HIGH (ref 11.5–15.5)
WBC: 7.6 10*3/uL (ref 4.0–10.5)
nRBC: 0 % (ref 0.0–0.2)

## 2021-09-02 LAB — MAGNESIUM: Magnesium: 2 mg/dL (ref 1.7–2.4)

## 2021-09-02 MED ORDER — SODIUM CHLORIDE 0.9% FLUSH
3.0000 mL | INTRAVENOUS | Status: DC | PRN
Start: 2021-09-02 — End: 2021-09-02

## 2021-09-02 MED ORDER — SODIUM CHLORIDE 0.9 % IV SOLN: 250.0000 mL | INTRAVENOUS | Status: DC | PRN

## 2021-09-02 MED ORDER — SODIUM CHLORIDE 0.9% FLUSH: 3.0000 mL | Freq: Two times a day (BID) | INTRAVENOUS | Status: DC

## 2021-09-02 MED ORDER — LACTATED RINGERS IV BOLUS: 1000.0000 mL | Freq: Three times a day (TID) | INTRAVENOUS | Status: DC | PRN

## 2021-09-02 NOTE — Discharge Summary (Signed)
Physician Discharge Summary    Patient ID: Chloe Young MRN: [id number] DOB/AGE: 1951/10/22  70 y.o.  Patient Care Team: Binnie Rail, MD as PCP - General (Internal Medicine) Charlton Haws, [facility] as Pharmacist (Pharmacist) [name], MD as Consulting Physician (Gastroenterology) Stark Klein, MD as Consulting Physician (General Surgery) [name], MD as Consulting Physician (Colon and Rectal Surgery) [name], MD as Consulting Physician (Cardiology)  Admit date: 09/01/2021  Discharge date: 09/02/2021  Hospital Stay = 1 days    Discharge Diagnoses:  Principal Problem:   Incisional hernia status post component separation & abdominal wall reconstruction with mesh 09/01/2021 Active Problems:   Hyperlipidemia   Essential hypertension   Cervical post-laminectomy syndrome   Prediabetes   GERD (gastroesophageal reflux disease)   Diverticulitis of large intestine with perforation and abscess s/p Hartmann/colostomy  11/08/2020   Parastomal hernia s/p robotic colostomy takedown 03/23/2021   Pain management contract signed  08/25/21   Chronic pain   1 Day Post-Op  09/01/2021  POST-OPERATIVE DIAGNOSIS:  RECURRENT INCISIONAL ABDOMINAL WALL HERNIA     PROCEDURE:   COMPONENT SEPARATION - BILATERAL TRANSVERSUS ABDOMINUS RELEASE (TAR) INCISIONAL HERNIA REPAIR WITH MESH PANNICULECTOMY TRANSVERSUS ABDOMINIS PLANE (TAP) BLOCK - BILATERAL PLACEMENT PREVENA WOUND VAC   SURGEON:  [name], MD   OR FINDINGS:    Patient had a 18 x 8 cm mainly periumbilical region of incisional hernia with large stellate scar and chronic 6 x 5 cm wound of subcutaneous tissue only.  Omentum adherent to this.  Intra-abdominal adhesions not major aside from omentum to pelvis with internal hernia creation.  No obstruction    Type of repair - Component separation repair.  Bilateral.  Transversus abdominis release               Name of mesh - Prolene    Size of mesh - Height  30 cm, Width 27 cm   Orientation:  Transverse    Mesh overlap - 5-10 cm   Placement of mesh - retrorectus underlay repair  Consults: None  Hospital Course:   The patient underwent the surgery above.  Postoperatively, the patient gradually mobilized and advanced to a solid diet.  Pain and other symptoms were treated aggressively.    By the time of discharge, the patient was walking well the hallways, eating food, having flatus.  Pain was well-controlled on an oral medications.  Based on meeting discharge criteria and continuing to recover, I felt it was safe for the patient to be discharged from the hospital to further recover with close followup. Postoperative recommendations were discussed in detail.  They are written as well.  Discharged Condition: good  Discharge Exam: Blood pressure 102/69, pulse 88, temperature 98.7 F (37.1 C), temperature source Oral, resp. rate 18, height '5\' 3"'$  (1.6 m), weight 54 kg, SpO2 98 %.  General: Pt awake/alert/oriented x4 in No acute distress Eyes: PERRL, normal EOM.  Sclera clear.  No icterus Neuro: CN II-XII intact w/o focal sensory/motor deficits. Lymph: No head/neck/groin lymphadenopathy Psych:  No delerium/psychosis/paranoia HENT: Normocephalic, Mucus membranes moist.  No thrush Neck: Supple, No tracheal deviation Chest: No chest wall pain w good excursion CV:  Pulses intact.  Regular rhythm MS: Normal AROM mjr joints.  No obvious deformity Abdomen: Soft.  Nondistended.   Drain with serosanguineous output.  Incisional VAC clean .  No evidence of peritonitis.  No incarcerated hernias. Ext:  SCDs BLE.  No mjr edema.  No cyanosis Skin: No petechiae /  purpura   Disposition:    Follow-up Information     [name], MD Follow up in 3 week(s).   Specialties: General Surgery, Colon and Rectal Surgery Why: To follow up after your operation, To follow up after your hospital stay Contact information: [place] [address] [phone]         [facility], [place] Follow up in 1 week(s).   Specialty: General Surgery Why: Follow-up in [facility] surgery office for nurse only visit to have your drain removed & incisions re-checked.  Have the purple sponge incisional wound VAC removed Contact information: [address] [place] [phone]                Discharge disposition: 01-Home or Self Care       Discharge Instructions     Call MD for:   Complete by: As directed    FEVER > 101.5 F  (temperatures < 101.5 F are not significant)   Call MD for:  extreme fatigue   Complete by: As directed    Call MD for:  persistant dizziness or light-headedness   Complete by: As directed    Call MD for:  persistant nausea and vomiting   Complete by: As directed    Call MD for:  redness, tenderness, or signs of infection (pain, swelling, redness, odor or green/yellow discharge around incision site)   Complete by: As directed    Call MD for:  severe uncontrolled pain   Complete by: As directed    Diet - low sodium heart healthy   Complete by: As directed    Start with a bland diet such as soups, liquids, starchy foods, low fat foods, etc. the first few days at home. Gradually advance to a solid, low-fat, high fiber diet by the end of the first week at home.   Add a fiber supplement to your diet (Metamucil, etc) If you feel full, bloated, or constipated, stay on a full liquid or pureed/blenderized diet for a few days until you feel better and are no longer constipated.   Discharge instructions   Complete by: As directed    See Discharge Instructions If you are not getting better after two weeks or are noticing you are getting worse, contact our office [phone] for further advice.  We may need to adjust your medications, re-evaluate you in the office, send you to the emergency room, or see what other things we can do to help. The clinic  staff is available to answer your questions during regular business hours (8:30am-5pm).  Please don't hesitate to call and ask to speak to one of our nurses for clinical concerns.    A surgeon from [facility] is always on call at the hospitals 24 hours/day If you have a medical emergency, go to the nearest emergency room or call 911.   Discharge wound care:   Complete by: As directed    It is good for closed incisions and even open wounds to be washed every day.  Shower every day.  Short baths are fine.  Wash the incisions and wounds clean with soap & water.    You may leave closed incisions open to air if it is dry.   You may cover the incision with clean gauze & replace it after your daily shower for comfort.  DRAIN:  You have a drain in place.  Every day change the dressing in the  shower, wash around the skin exit site with soap & water and place a new dressing of gauze or band aid around the skin every day.  Keep the drain site clean & dry.  Follow up in our surgery office to discuss plans for drain removal in the near future  You have a gray sponge wound VAC helping the incision closed and healed.  When the battery runs out in about a week, he can remove everything.  See written instructions and call office for further guidance if needed.   Driving Restrictions   Complete by: As directed    You may drive when: - you are no longer taking narcotic prescription pain medication - you can comfortably wear a seatbelt - you can safely make sudden turns/stops without pain.   Increase activity slowly   Complete by: As directed    Start light daily activities --- self-care, walking, climbing stairs- beginning the day after surgery.  Gradually increase activities as tolerated.  Control your pain to be active.  Stop when you are tired.  Ideally, walk several times a day, eventually an hour a day.   Most people are back to most day-to-day activities in a few weeks.  It takes 4-6 weeks to  get back to unrestricted, intense activity. If you can walk 30 minutes without difficulty, it is safe to try more intense activity such as jogging, treadmill, bicycling, low-impact aerobics, swimming, etc. Save the most intensive and strenuous activity for last (Usually 4-8 weeks after surgery) such as sit-ups, heavy lifting, contact sports, etc.  Refrain from any intense heavy lifting or straining until you are off narcotics for pain control.  You will have off days, but things should improve week-by-week. DO NOT PUSH THROUGH PAIN.  Let pain be your guide: If it hurts to do something, don't do it.   Lifting restrictions   Complete by: As directed    If you can walk 30 minutes without difficulty, it is safe to try more intense activity such as jogging, treadmill, bicycling, low-impact aerobics, swimming, etc. Save the most intensive and strenuous activity for last (Usually 4-8 weeks after surgery) such as sit-ups, heavy lifting, contact sports, etc.   Refrain from any intense heavy lifting or straining until you are off narcotics for pain control.  You will have off days, but things should improve week-by-week. DO NOT PUSH THROUGH PAIN.  Let pain be your guide: If it hurts to do something, don't do it.  Pain is your body warning you to avoid that activity for another week until the pain goes down.   May shower / Bathe   Complete by: As directed    May walk up steps   Complete by: As directed    Remove dressing in 72 hours   Complete by: As directed    Make sure all dressings are removed by the third day after surgery.  Leave incisions open to air.  OK to cover incisions with gauze or bandages as desired   Sexual Activity Restrictions   Complete by: As directed    You may have sexual intercourse when it is comfortable. If it hurts to do something, stop.       Allergies as of 09/02/2021   No Known Allergies      Medication List     TAKE these medications    acetaminophen 500 MG  tablet Commonly known as: TYLENOL Take 500-1,000 mg by mouth every 6 (six) hours as needed (for pain.).  alendronate 70 MG tablet Commonly known as: FOSAMAX TAKE 1 TABLET BY MOUTH  EVERY 7 DAYS TAKE WITH A  FULL GLASS OF WATER ON AN  EMPTY STOMACH What changed: See the new instructions.   amoxicillin 500 MG capsule Commonly known as: AMOXIL Take 2,000 mg by mouth See admin instructions. Take 4 capsules (2000 mg) by mouth 1 hour prior to dental appointment   CALCIUM + D PO Take 1 tablet by mouth daily.   cyclobenzaprine 5 MG tablet Commonly known as: FLEXERIL Take 1-2 tablets (5-10 mg total) by mouth at bedtime as needed for muscle spasms.   fexofenadine 180 MG tablet Commonly known as: ALLEGRA Take 180 mg by mouth in the morning.   fluticasone 50 MCG/ACT nasal spray Commonly known as: FLONASE USE 1 SPRAY IN BOTH  NOSTRILS TWICE DAILY What changed: See the new instructions.   Lidocaine 4 % Ptch Place 1 patch onto the skin daily as needed (pain (neck)).   Lubricant Drops/Dual-Action 0.5-0.9 % ophthalmic solution Generic drug: carboxymethylcellul-glycerin Place 1 drop into both eyes 3 (three) times daily as needed for dry eyes.   Lyrica 150 MG capsule Generic drug: pregabalin Take 1 capsule (150 mg total) by mouth 2 (two) times daily.   metoprolol tartrate 25 MG tablet Commonly known as: LOPRESSOR Take 0.5 tablets (12.5 mg total) by mouth 2 (two) times daily.   multivitamin with minerals Tabs tablet Take 1 tablet by mouth in the morning.   omeprazole 20 MG capsule Commonly known as: PRILOSEC TAKE 1 CAPSULE BY MOUTH  DAILY What changed:  how much to take how to take this when to take this additional instructions   oxyCODONE 5 MG immediate release tablet Commonly known as: Oxy IR/ROXICODONE Take 1 to 2 tablets (5-10 mg total) by mouth every 6 hours as needed for moderate pain, severe pain or breakthrough pain.   oxyCODONE-acetaminophen 7.5-325 MG  tablet Commonly known as: Percocet Take 1 tablet by mouth every 12 (twelve) hours as needed for severe pain (chronic cervicogenic headaches).   rosuvastatin 10 MG tablet Commonly known as: Crestor Take 1 tablet (10 mg total) by mouth daily.   topiramate 100 MG tablet Commonly known as: TOPAMAX Take 1 tablet (100 mg total) by mouth 2 (two) times daily.   triamcinolone 55 MCG/ACT Aero nasal inhaler Commonly known as: NASACORT Place 2 sprays into the nose daily as needed (allergies).               Discharge Care Instructions  (From admission, onward)           Start     Ordered   09/01/21 0000  Discharge wound care:       Comments: It is good for closed incisions and even open wounds to be washed every day.  Shower every day.  Short baths are fine.  Wash the incisions and wounds clean with soap & water.    You may leave closed incisions open to air if it is dry.   You may cover the incision with clean gauze & replace it after your daily shower for comfort.  DRAIN:  You have a drain in place.  Every day change the dressing in the shower, wash around the skin exit site with soap & water and place a new dressing of gauze or band aid around the skin every day.  Keep the drain site clean & dry.  Follow up in our surgery office to discuss plans for drain removal in the near future  You have a gray sponge wound VAC helping the incision closed and healed.  When the battery runs out in about a week, he can remove everything.  See written instructions and call office for further guidance if needed.   09/01/21 1136            Significant Diagnostic Studies:  Results for orders placed or performed during the hospital encounter of 09/01/21 (from the past 72 hour(s))  Basic metabolic panel     Status: Abnormal   Collection Time: 09/02/21  3:57 AM  Result Value Ref Range   Sodium 138 135 - 145 mmol/L   Potassium 4.1 3.5 - 5.1 mmol/L   Chloride 110 98 - 111 mmol/L   CO2 23 22 -  32 mmol/L   Glucose, Bld 129 (H) 70 - 99 mg/dL    Comment: Glucose reference range applies only to samples taken after fasting for at least 8 hours.   BUN 11 8 - 23 mg/dL   Creatinine, Ser 0.63 0.44 - 1.00 mg/dL   Calcium 8.7 (L) 8.9 - 10.3 mg/dL   GFR, Estimated >60 >60 mL/min    Comment: (NOTE) Calculated using the CKD-EPI Creatinine Equation (2021)    Anion gap 5 5 - 15    Comment: Performed at [facility], [place] [address] [place], [place] [address]  Magnesium     Status: None   Collection Time: 09/02/21  3:57 AM  Result Value Ref Range   Magnesium 2.0 1.7 - 2.4 mg/dL    Comment: Performed at [facility], [place] [address] [place], [place] [address]  CBC     Status: Abnormal   Collection Time: 09/02/21  3:57 AM  Result Value Ref Range   WBC 7.6 4.0 - 10.5 K/uL   RBC 3.04 (L) 3.87 - 5.11 MIL/uL   Hemoglobin 8.6 (L) 12.0 - 15.0 g/dL   HCT 26.8 (L) 36.0 - 46.0 %   MCV 88.2 80.0 - 100.0 fL   MCH 28.3 26.0 - 34.0 pg   MCHC 32.1 30.0 - 36.0 g/dL   RDW 15.6 (H) 11.5 - 15.5 %   Platelets 191 150 - 400 K/uL   nRBC 0.0 0.0 - 0.2 %    Comment: Performed at [facility], [place] [address], [place], [place] [address]    No results found.  Past Medical History:  Diagnosis Date   Cervical facet syndrome    Cervical post-laminectomy syndrome    Dr Tessa Lerner[facility]   Cervicalgia    Chronic pain syndrome    Closed fracture of proximal end of right humerus 03/08/2021   Family history of adverse reaction to anesthesia    sister slow to wake up and nausea   GERD (gastroesophageal reflux disease)    Headache    due to neck fusion   Hyperlipidemia    joint pain with statin   Hypertension    Osteopenia 06/2016   T score -1.6 FRAX not calculated   Perforated abdominal viscus    Severe sepsis ([place]) 11/08/2020    Past Surgical History:  Procedure Laterality Date   CERVIAL FUSION  12/25/2004   Dr Arnoldo Morale, [place]   COLECTOMY  WITH COLOSTOMY CREATION/HARTMANN PROCEDURE  11/08/2020   Procedure: SIGMOID COLECTOMY WITH COLOSTOMY CREATION/HARTMANN PROCEDURE;  Surgeon: Stark Klein, MD;  Location: [place];  Service: General;;   COLONOSCOPY  12/25/2009    Dr Fuller Plan   COLOSTOMY REVERSAL     02/2021   CYSTOSCOPY N/A  03/23/2021   Procedure: CYSTOSCOPY WITH BILATERAL FIREFLY INJECTION;  Surgeon: Raynelle Bring, MD;  Location: [facility];  Service: Urology;  Laterality: N/A;   ESI  09/03/2013   L 5- S1 ; Dr Nelva Bush   JOINT REPLACEMENT  07/26/2011    RIGHT KNEE ARTHROPLASTY   KNEE ARTHROSCOPY     bilaterally; Dr Tonita Cong   LAPAROTOMY N/A 11/08/2020   Procedure: EXPLORATORY LAPAROTOMY;  Surgeon: Stark Klein, MD;  Location: [place];  Service: General;  Laterality: N/A;   TOTAL KNEE ARTHROPLASTY  01/11/2012   Procedure: TOTAL KNEE ARTHROPLASTY;  Surgeon: Johnn Hai, MD;  Location: [facility];  Service: Orthopedics;  Laterality: Left;  General with Femoral nerve block    Social History   Socioeconomic History   Marital status: Married    Spouse name: Not on file   Number of children: 1   Years of education: Not on file   Highest education level: Not on file  Occupational History   Occupation: HAIRDRESSER    Employer: [employer]  Tobacco Use   Smoking status: Never   Smokeless tobacco: Never  Vaping Use   Vaping Use: Never used  Substance and Sexual Activity   Alcohol use: Not Currently    Comment: none since 10/2020   Drug use: No   Sexual activity: Yes    Birth control/protection: Post-menopausal  Other Topics Concern   Not on file  Social History Narrative   Not on file   Social Determinants of Health   Financial Resource Strain: Not on file  Food Insecurity: Not on file  Transportation Needs: Not on file  Physical Activity: Not on file  Stress: Not on file  Social Connections: Not on file  Intimate Partner Violence: Not on file    Family History  Problem Relation Age of Onset   Diverticulitis  Mother 47       with fissures.Marland Kitchenopted for no surgery   Cancer Father 37        malignant brain tumor   Breast cancer Maternal Aunt    Diabetes Maternal Grandfather    Heart failure Maternal Grandfather    Stroke Maternal Uncle        in 34s   Heart disease Maternal Uncle        X4 ; 1 had MI @ 26   Hypertension Sister    Diverticulitis Sister    Colon cancer Neg Hx    Colon polyps Neg Hx    Esophageal cancer Neg Hx    Rectal cancer Neg Hx    Stomach cancer Neg Hx     Current Facility-Administered Medications  Medication Dose Route Frequency Provider Last Rate Last Admin   0.9 %  sodium chloride infusion   Intravenous Q8H PRN [name], MD       0.9 %  sodium chloride infusion  250 mL Intravenous PRN [name], MD       acetaminophen (TYLENOL) tablet 1,000 mg  1,000 mg Oral Lajuana Ripple, MD   1,000 mg at 09/02/21 1122   alum & mag hydroxide-simeth (MAALOX/MYLANTA) 200-200-20 MG/5ML suspension 30 mL  30 mL Oral Q6H PRN [name], MD       bisacodyl (DULCOLAX) suppository 10 mg  10 mg Rectal Daily PRN [name], MD       cyclobenzaprine (FLEXERIL) tablet 5-10 mg  5-10 mg Oral QHS PRN [name], MD       diphenhydrAMINE (BENADRYL) 12.5 MG/5ML elixir 12.5 mg  12.5 mg Oral  Q6H PRN [name], MD       Or   diphenhydrAMINE (BENADRYL) injection 12.5 mg  12.5 mg Intravenous Q6H PRN [name], MD       enoxaparin (LOVENOX) injection 40 mg  40 mg Subcutaneous Q24H [name], MD   40 mg at 09/02/21 0749   fluticasone (FLONASE) 50 MCG/ACT nasal spray 1 spray  1 spray Each Nare BID PRN [name], MD       HYDROmorphone (DILAUDID) injection 0.5-2 mg  0.5-2 mg Intravenous Q2H PRN [name], MD   1 mg at 09/01/21 1516   lactated ringers bolus 1,000 mL  1,000 mL Intravenous Q8H PRN [name], MD       lidocaine (LIDODERM) 5 % 1 patch  1 patch Transdermal Daily PRN [name], MD       lip balm (CARMEX) ointment 1 application  1 application Topical  BID [name], MD   1 application at 0000000 1022   loratadine (CLARITIN) tablet 10 mg  10 mg Oral Daily [name], MD   10 mg at 09/02/21 1021   magic mouthwash  15 mL Oral QID PRN [name], MD       magnesium hydroxide (MILK OF MAGNESIA) suspension 30 mL  30 mL Oral Daily PRN [name], MD       menthol-cetylpyridinium (CEPACOL) lozenge 3 mg  1 lozenge Oral PRN [name], MD       methocarbamol (ROBAXIN) 1,000 mg in dextrose 5 % 100 mL IVPB  1,000 mg Intravenous Q6H PRN [name], MD       metoprolol tartrate (LOPRESSOR) injection 5 mg  5 mg Intravenous Q6H PRN [name], MD       metoprolol tartrate (LOPRESSOR) tablet 12.5 mg  12.5 mg Oral BID [name], MD   12.5 mg at 09/02/21 1021   multivitamin with minerals tablet 1 tablet  1 tablet Oral q AM [name], MD   1 tablet at 09/02/21 [id number]   oxyCODONE (Oxy IR/ROXICODONE) immediate release tablet 5-10 mg  5-10 mg Oral Q4H PRN [name], MD   5 mg at 09/02/21 1021   pantoprazole (PROTONIX) EC tablet 40 mg  40 mg Oral Daily [name], MD   40 mg at 09/02/21 1022   phenol (CHLORASEPTIC) mouth spray 2 spray  2 spray Mouth/Throat PRN [name], MD       polyvinyl alcohol (LIQUIFILM TEARS) 1.4 % ophthalmic solution 1 drop  1 drop Both Eyes TID PRN [name], MD       pregabalin (LYRICA) capsule 150 mg  150 mg Oral BID [name], MD   150 mg at 09/02/21 1022   rosuvastatin (CRESTOR) tablet 10 mg  10 mg Oral Daily [name], MD   10 mg at 09/02/21 1023   simethicone (MYLICON) chewable tablet 40 mg  40 mg Oral Q6H PRN [name], MD       sodium chloride flush (NS) 0.9 % injection 3 mL  3 mL Intravenous Gorden Harms, MD       sodium chloride flush (NS) 0.9 % injection 3 mL  3 mL Intravenous PRN [name], MD       topiramate (TOPAMAX) tablet 100 mg  100 mg Oral BID [name], MD   100 mg at 09/02/21 1023   traMADol (ULTRAM) tablet 50 mg  50 mg Oral Q6H PRN [name], MD    50 mg at 09/02/21 1333   triamcinolone (NASACORT) nasal inhaler 2  spray  2 spray Nasal Daily PRN [name], MD         No Known Allergies  Signed: Morton Peters, MD, FACS, MASCRS Esophageal, Gastrointestinal & Colorectal Surgery Robotic and Minimally Invasive Surgery  [facility], [place]. [address], [place], [place] [address] [phone] Fax [phone] Main  CONTACT INFORMATION:  Weekday (9AM-5PM): Call [facility] main office at [phone]  Weeknight (5PM-9AM) or Weekend/Holiday: Check [email or web address] (password " [email or web address]") for General Surgery [facility] coverage  (Please, do not use SecureChat as it is not reliable communication to operating surgeons for immediate patient care)      09/02/2021, 2:13 PM

## 2021-09-02 NOTE — Evaluation (Signed)
Occupational Therapy Evaluation Patient Details Name: Chloe Young MRN: [id number] DOB: Mar 21, 1951 Today's Date: 09/02/2021    History of Present Illness Patient s/p Incisional hernia status post component separation & abdominal wall reconstruction with mesh 09/01/2021   Clinical Impression   Chloe Young is a 70 year old woman s/p abdominal surgery who demonstrates ability to perform functional mobility and ADLs. Patient needed walker for ambulation in hall approx 300 ft and increased time and different positioning for LB ADLs. Therapist instructed and recommended compensatory strategies for ease of task.  Patient has no further OT needs.     Follow Up Recommendations  No OT follow up    Equipment Recommendations  None recommended by OT    Recommendations for Other Services       Precautions / Restrictions Precautions Precaution Comments: abdominal wound Restrictions Weight Bearing Restrictions: No      Mobility Bed Mobility               General bed mobility comments: OOB    Transfers Overall transfer level: Modified independent Equipment used: Rolling walker (2 wheeled)             General transfer comment: no physical assistance but decreased gait speed.    Balance Overall balance assessment: Mild deficits observed, not formally tested                                         ADL either performed or assessed with clinical judgement   ADL Overall ADL's : Modified independent                                       General ADL Comments: increased time but still able to perform.     Vision Patient Visual Report: No change from baseline       Perception     Praxis      Pertinent Vitals/Pain Pain Assessment: 0-10 Pain Score: 3  Pain Descriptors / Indicators: Sore Pain Intervention(s): Limited activity within patient's tolerance     Hand Dominance Right   Extremity/Trunk Assessment Upper Extremity  Assessment Upper Extremity Assessment: Overall WFL for tasks assessed           Communication Communication Communication: No difficulties   Cognition Arousal/Alertness: Awake/alert Behavior During Therapy: WFL for tasks assessed/performed Overall Cognitive Status: Within Functional Limits for tasks assessed                                     General Comments       Exercises     Shoulder Instructions      Home Living Family/patient expects to be discharged to:: Private residence Living Arrangements: Alone Available Help at Discharge: Family;Available 24 hours/day Type of Home: House Home Access: Stairs to enter [employer] of Steps: 5 Entrance Stairs-Rails: Right Home Layout: One level     Bathroom Shower/Tub: Teacher, early years/pre: Standard     Home Equipment: Environmental consultant - 2 wheels;Cane - single point;Grab bars - tub/shower          Prior Functioning/Environment Level of Independence: Independent                 OT Problem List: Decreased  activity tolerance;Pain      OT Treatment/Interventions:      OT Goals(Current goals can be found in the care plan section) Acute Rehab OT Goals OT Goal Formulation: All assessment and education complete, DC therapy  OT Frequency:     Barriers to D/C:            Co-evaluation              AM-PAC OT "6 Clicks" Daily Activity     Outcome Measure Help from another person eating meals?: None Help from another person taking care of personal grooming?: None Help from another person toileting, which includes using toliet, bedpan, or urinal?: None Help from another person bathing (including washing, rinsing, drying)?: None Help from another person to put on and taking off regular upper body clothing?: None Help from another person to put on and taking off regular lower body clothing?: None 6 Click Score: 24   End of Session Equipment Utilized During Treatment: Rolling  walker Nurse Communication: Mobility status  Activity Tolerance: Patient tolerated treatment well Patient left: in chair;with call bell/phone within reach  OT Visit Diagnosis: Pain                Time: 1330-1346 OT Time Calculation (min): 16 min Charges:  OT General Charges $OT Visit: 1 Visit OT Evaluation $OT Eval Low Complexity: 1 Low  Laurian Edrington, OTR/L [place]  Office [phone] Pager: [place] 09/02/2021, 2:36 PM

## 2021-09-02 NOTE — Progress Notes (Signed)
Patient was given discharge instructions with 2 family members present, spouse and son. All questions were answered. Patient was stable for discharge and was taken to the main exit by wheelchair.

## 2021-09-02 NOTE — Progress Notes (Signed)
Chloe Young [id number] May 20, 1951  CARE TEAM:  PCP: Binnie Rail, MD  Outpatient Care Team: Patient Care Team: Binnie Rail, MD as PCP - General (Internal Medicine) Charlton Haws, [facility] as Pharmacist (Pharmacist) [name], MD as Consulting Physician (Gastroenterology) Stark Klein, MD as Consulting Physician (General Surgery) [name], MD as Consulting Physician (Colon and Rectal Surgery) [name], MD as Consulting Physician (Cardiology)  Inpatient Treatment Team: Treatment Team: Attending Provider: [name], MD; Charge Nurse: Aura Dials, RN; Pharmacist: Lynelle Doctor, [facility]; Utilization Review: Micah Noel, RN; Technician: Karna Christmas, NT; Social Worker: Lowella Curb, [place]   Problem List:   Principal Problem:   Incisional hernia status post component separation & abdominal wall reconstruction with mesh 09/01/2021 Active Problems:   Hyperlipidemia   Essential hypertension   Cervical post-laminectomy syndrome   Prediabetes   GERD (gastroesophageal reflux disease)   Diverticulitis of large intestine with perforation and abscess s/p Hartmann/colostomy  11/08/2020   Parastomal hernia s/p robotic colostomy takedown 03/23/2021   Pain management contract signed  08/25/21   Chronic pain   1 Day Post-Op  09/01/2021  POST-OPERATIVE DIAGNOSIS:  RECURRENT INCISIONAL ABDOMINAL WALL HERNIA     PROCEDURE:   COMPONENT SEPARATION - BILATERAL TRANSVERSUS ABDOMINUS RELEASE (TAR) INCISIONAL HERNIA REPAIR WITH MESH PANNICULECTOMY TRANSVERSUS ABDOMINIS PLANE (TAP) BLOCK - BILATERAL PLACEMENT PREVENA WOUND VAC   SURGEON:  [name], MD  OR FINDINGS:    Patient had a 18 x 8 cm mainly periumbilical region of incisional hernia with large stellate scar and chronic 6 x 5 cm wound of subcutaneous tissue only.  Omentum adherent to this.  Intra-abdominal adhesions not major aside from omentum to pelvis with internal hernia creation.  No  obstruction    Type of repair - Component separation repair.  Bilateral.  Transversus abdominis release               Name of mesh - Prolene    Size of mesh - Height 30 cm, Width 27 cm   Orientation:  Transverse    Mesh overlap - 5-10 cm   Placement of mesh - retrorectus underlay repair    Assessment  Recovering  [facility] Stay = 1 days)  Plan:  Solid diet.  DC Foley and IV fluids.  Mobilize.  Assessment by physical Occupational Therapy to make sure no need for home health or equipment needs.  Continue usual chronic pain medications.  Probable DC later today if pain adequately controlled.  Wrote for 1 additional narcotic prescription perioperatively and then most likely transition back to her chronic pain specialist  Pretension controlled.  Reflux controlled.  VTE prophylaxis- SCDs, etc  mobilize as tolerated to help recovery  Disposition:  Disposition:  The patient is from: Home  Anticipate discharge to:  Home  Anticipated Date of Discharge is:  September 9,2022    Barriers to discharge:  Pending Clinical improvement (more likely than not)  Patient currently is  most likely stable later today  for discharge from the hospital from a surgery standpoint.      25 minutes spent in review, evaluation, examination, counseling, and coordination of care.   I have reviewed this patient's available data, including medical history, events of note, physical examination and test results as part of my evaluation.  A significant portion of that time was spent in counseling.  Care during the described time interval was provided by me.  09/02/2021    Subjective: (Chief  complaint)  Pain mostly controlled.  Walking hallways.  Nursing in room.    Tolerated liquids.  Advancing diet  Objective:  Vital signs:  Vitals:   09/01/21 1617 09/01/21 1733 09/01/21 2131 09/02/21 0603  BP: 103/71 103/64 105/68 100/63  Pulse: 76 81 92 91  Resp: '16 16 16 16  '$ Temp: (!)  97.5 F (36.4 C) 97.9 F (36.6 C) 98.4 F (36.9 C) 98.2 F (36.8 C)  TempSrc: Oral Oral Oral Oral  SpO2:  99% 98% 98%  Weight:      Height:        Last BM Date: 08/31/21  Intake/Output   Yesterday:  09/08 0701 - 09/09 0700 In: 3552.6 [P.O.:358; I.V.:3094.6; IV Piggyback:100] Out: 1945 [id number]; Drains:145; Blood:150] This shift:  No intake/output data recorded.  Bowel function:  Flatus: YES  BM:  No  Drain: Serosanguinous   Physical Exam:  General: Pt awake/alert in no acute distress Eyes: PERRL, normal EOM.  Sclera clear.  No icterus Neuro: CN II-XII intact w/o focal sensory/motor deficits. Lymph: No head/neck/groin lymphadenopathy Psych:  No delerium/psychosis/paranoia.  Oriented x 4 HENT: Normocephalic, Mucus membranes moist.  No thrush Neck: Supple, No tracheal deviation.  No obvious thyromegaly Chest: No pain to chest wall compression.  Good respiratory excursion.  No audible wheezing CV:  Pulses intact.  Regular rhythm.  No major extremity edema MS: Normal AROM mjr joints.  No obvious deformity  Abdomen: Soft.  Nondistended.  Mildly tender at incisions only.  Transverse incision with Prevena wound VAC in place.  No evidence of peritonitis.  No incarcerated hernias.  Ext:   No deformity.  No mjr edema.  No cyanosis Skin: No petechiae / purpurea.  No major sores.  Warm and dry    Results:   Cultures: Recent Results (from the past 720 hour(s))  SARS Coronavirus 2 (TAT 6-24 hrs)     Status: None   Collection Time: 08/30/21 12:00 AM  Result Value Ref Range Status   SARS Coronavirus 2 RESULT: NEGATIVE  Final    Comment: RESULT: NEGATIVESARS-CoV-2 INTERPRETATION:A NEGATIVE  test result means that SARS-CoV-2 RNA was not present in the specimen above the limit of detection of this test. This does not preclude a possible SARS-CoV-2 infection and should not be used as the  sole basis for patient management decisions. Negative results must be combined with  clinical observations, patient history, and epidemiological information. Optimum specimen types and timing for peak viral levels during infections caused by SARS-CoV-2  have not been determined. Collection of multiple specimens or types of specimens may be necessary to detect virus. Improper specimen collection and handling, sequence variability under primers/probes, or organism present below the limit of detection may  lead to false negative results. Positive and negative predictive values of testing are highly dependent on prevalence. False negative test results are more likely when prevalence of disease is high.The expected result is NEGATIVE.Fact S heet for  Healthcare Providers: LocalChronicle.no Sheet for Patients: SalonLookup.es Reference Range - Negative     Labs: Results for orders placed or performed during the hospital encounter of 09/01/21 (from the past 48 hour(s))  Basic metabolic panel     Status: Abnormal   Collection Time: 09/02/21  3:57 AM  Result Value Ref Range   Sodium 138 135 - 145 mmol/L   Potassium 4.1 3.5 - 5.1 mmol/L   Chloride 110 98 - 111 mmol/L   CO2 23 22 - 32 mmol/L   Glucose, Bld 129 (H) 70 - 99 mg/dL  Comment: Glucose reference range applies only to samples taken after fasting for at least 8 hours.   BUN 11 8 - 23 mg/dL   Creatinine, Ser 0.63 0.44 - 1.00 mg/dL   Calcium 8.7 (L) 8.9 - 10.3 mg/dL   GFR, Estimated >60 >60 mL/min    Comment: (NOTE) Calculated using the CKD-EPI Creatinine Equation (2021)    Anion gap 5 5 - 15    Comment: Performed at [facility], [place] [address] [place], [place] [address]  Magnesium     Status: None   Collection Time: 09/02/21  3:57 AM  Result Value Ref Range   Magnesium 2.0 1.7 - 2.4 mg/dL    Comment: Performed at [facility], [place] [address], [place], [place] [address]  CBC     Status: Abnormal   Collection Time:  09/02/21  3:57 AM  Result Value Ref Range   WBC 7.6 4.0 - 10.5 K/uL   RBC 3.04 (L) 3.87 - 5.11 MIL/uL   Hemoglobin 8.6 (L) 12.0 - 15.0 g/dL   HCT 26.8 (L) 36.0 - 46.0 %   MCV 88.2 80.0 - 100.0 fL   MCH 28.3 26.0 - 34.0 pg   MCHC 32.1 30.0 - 36.0 g/dL   RDW 15.6 (H) 11.5 - 15.5 %   Platelets 191 150 - 400 K/uL   nRBC 0.0 0.0 - 0.2 %    Comment: Performed at [facility], [place] [address] [place], [place] [address]    Imaging / Studies: No results found.  Medications / Allergies: per chart  Antibiotics: Anti-infectives (From admission, onward)    Start     Dose/Rate Route Frequency Ordered Stop   09/01/21 0600  ceFAZolin (ANCEF) IVPB 2g/100 mL premix        2 g 200 mL/hr over 30 Minutes Intravenous On call to O.R. 09/01/21 0509 09/01/21 0745         Note: Portions of this report may have been transcribed using voice recognition software. Every effort was made to ensure accuracy; however, inadvertent computerized transcription errors may be present.   Any transcriptional errors that result from this process are unintentional.    [name], MD, FACS, MASCRS Esophageal, Gastrointestinal & Colorectal Surgery Robotic and Minimally Invasive Surgery  [facility], [place]  [place]. [address] [place], [place] [address] [phone] Fax [phone] Main  CONTACT INFORMATION:  Weekday (9AM-5PM): Call [facility] main office at [phone]  Weeknight (5PM-9AM) or Weekend/Holiday: Check [email or web address] (password " [email or web address]") for General Surgery [facility] coverage  (Please, do not use SecureChat as it is not reliable communication to operating surgeons for immediate patient care)      09/02/2021  9:00 AM

## 2021-09-08 ENCOUNTER — Other Ambulatory Visit (HOSPITAL_COMMUNITY): Payer: Self-pay

## 2021-09-08 ENCOUNTER — Other Ambulatory Visit: Payer: Self-pay

## 2021-09-12 ENCOUNTER — Other Ambulatory Visit (HOSPITAL_COMMUNITY): Payer: Self-pay

## 2021-09-13 ENCOUNTER — Other Ambulatory Visit (HOSPITAL_COMMUNITY): Payer: Self-pay

## 2021-09-14 ENCOUNTER — Other Ambulatory Visit (HOSPITAL_COMMUNITY): Payer: Self-pay

## 2021-09-29 ENCOUNTER — Other Ambulatory Visit (HOSPITAL_BASED_OUTPATIENT_CLINIC_OR_DEPARTMENT_OTHER): Payer: Self-pay

## 2021-10-11 ENCOUNTER — Other Ambulatory Visit: Payer: Self-pay

## 2021-10-11 MED ORDER — ROSUVASTATIN CALCIUM 10 MG PO TABS
10.0000 mg | ORAL_TABLET | Freq: Every day | ORAL | 3 refills | Status: DC
Start: 2021-10-11 — End: 2022-06-19

## 2021-10-14 ENCOUNTER — Other Ambulatory Visit: Payer: Self-pay | Admitting: Internal Medicine

## 2021-10-18 IMAGING — DX DG SHOULDER 2+V PORT*R*
2 series · 2 of 2 positions shown · non-contrast
Comparison: November 08, 2020 chest radiograph including right
shoulder on frontal view

CLINICAL DATA: Fracture

EXAM:
PORTABLE RIGHT SHOULDER

[shoulder ap]
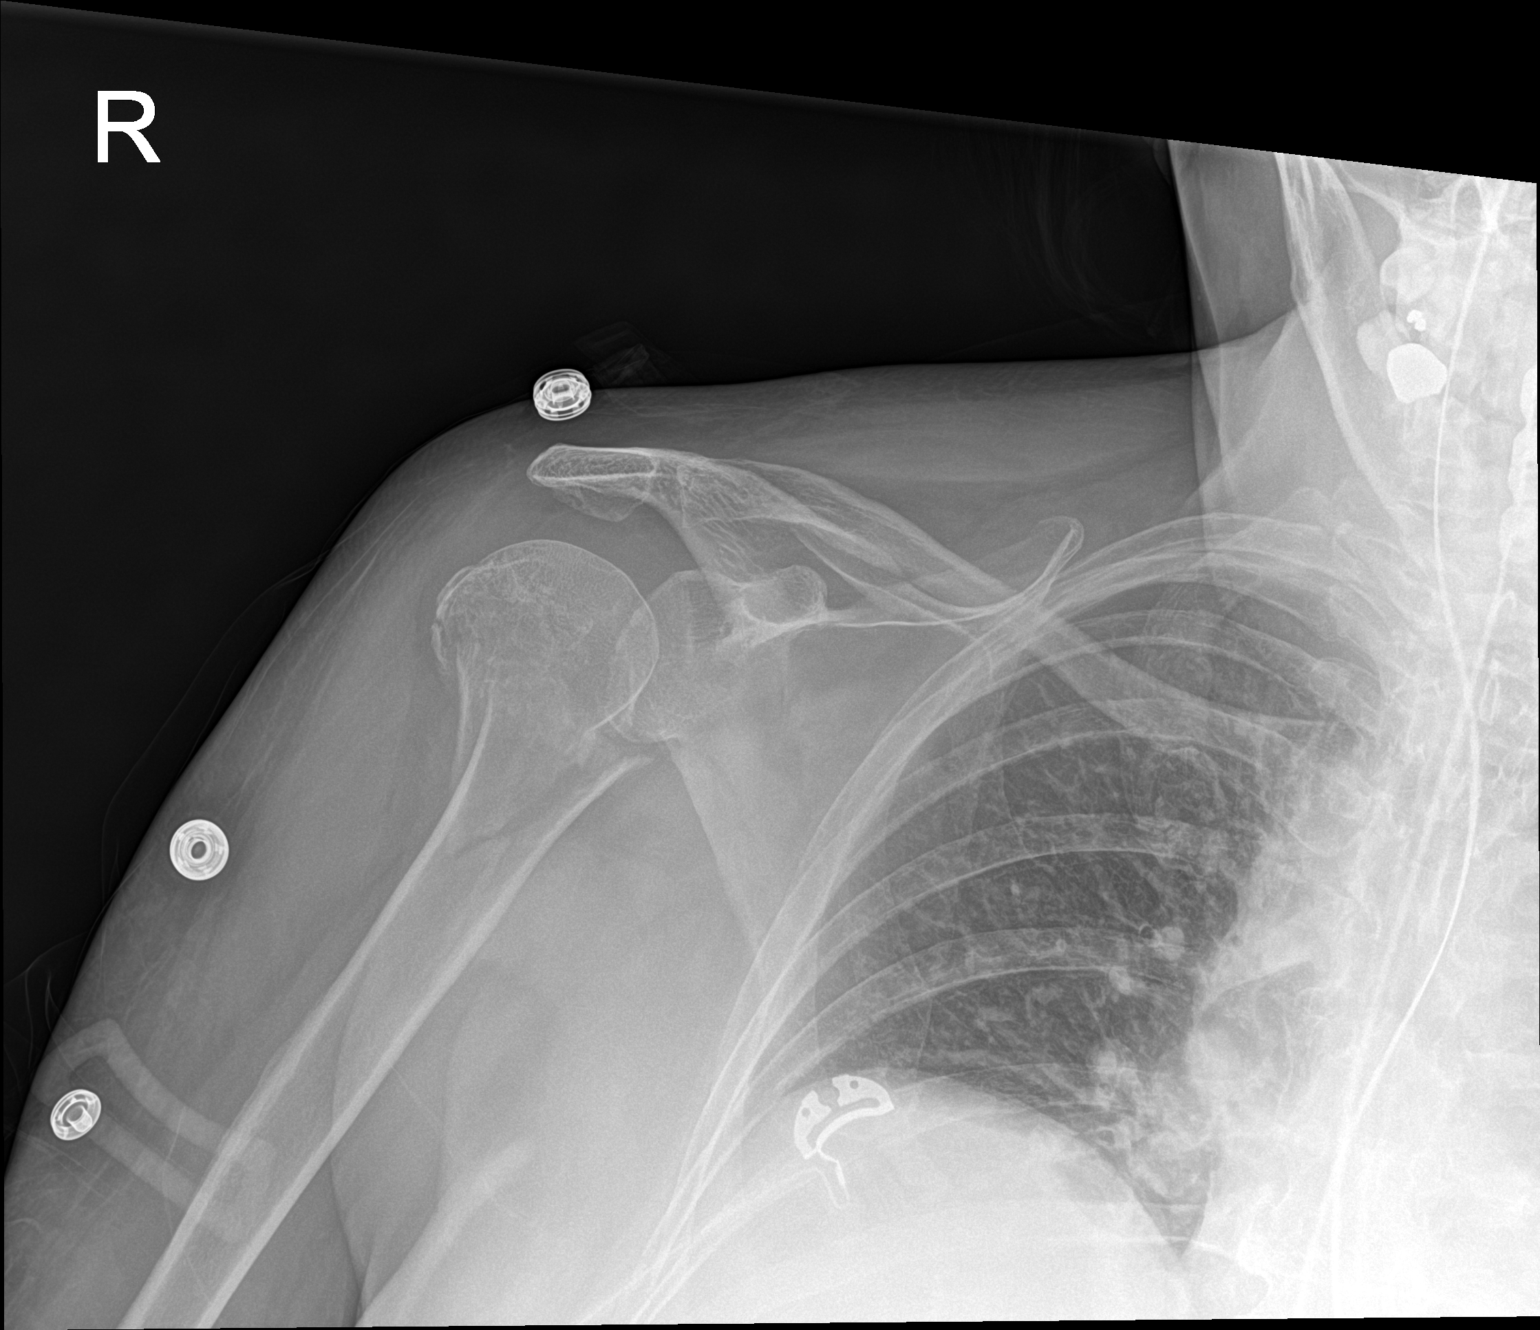

[shoulder obl]
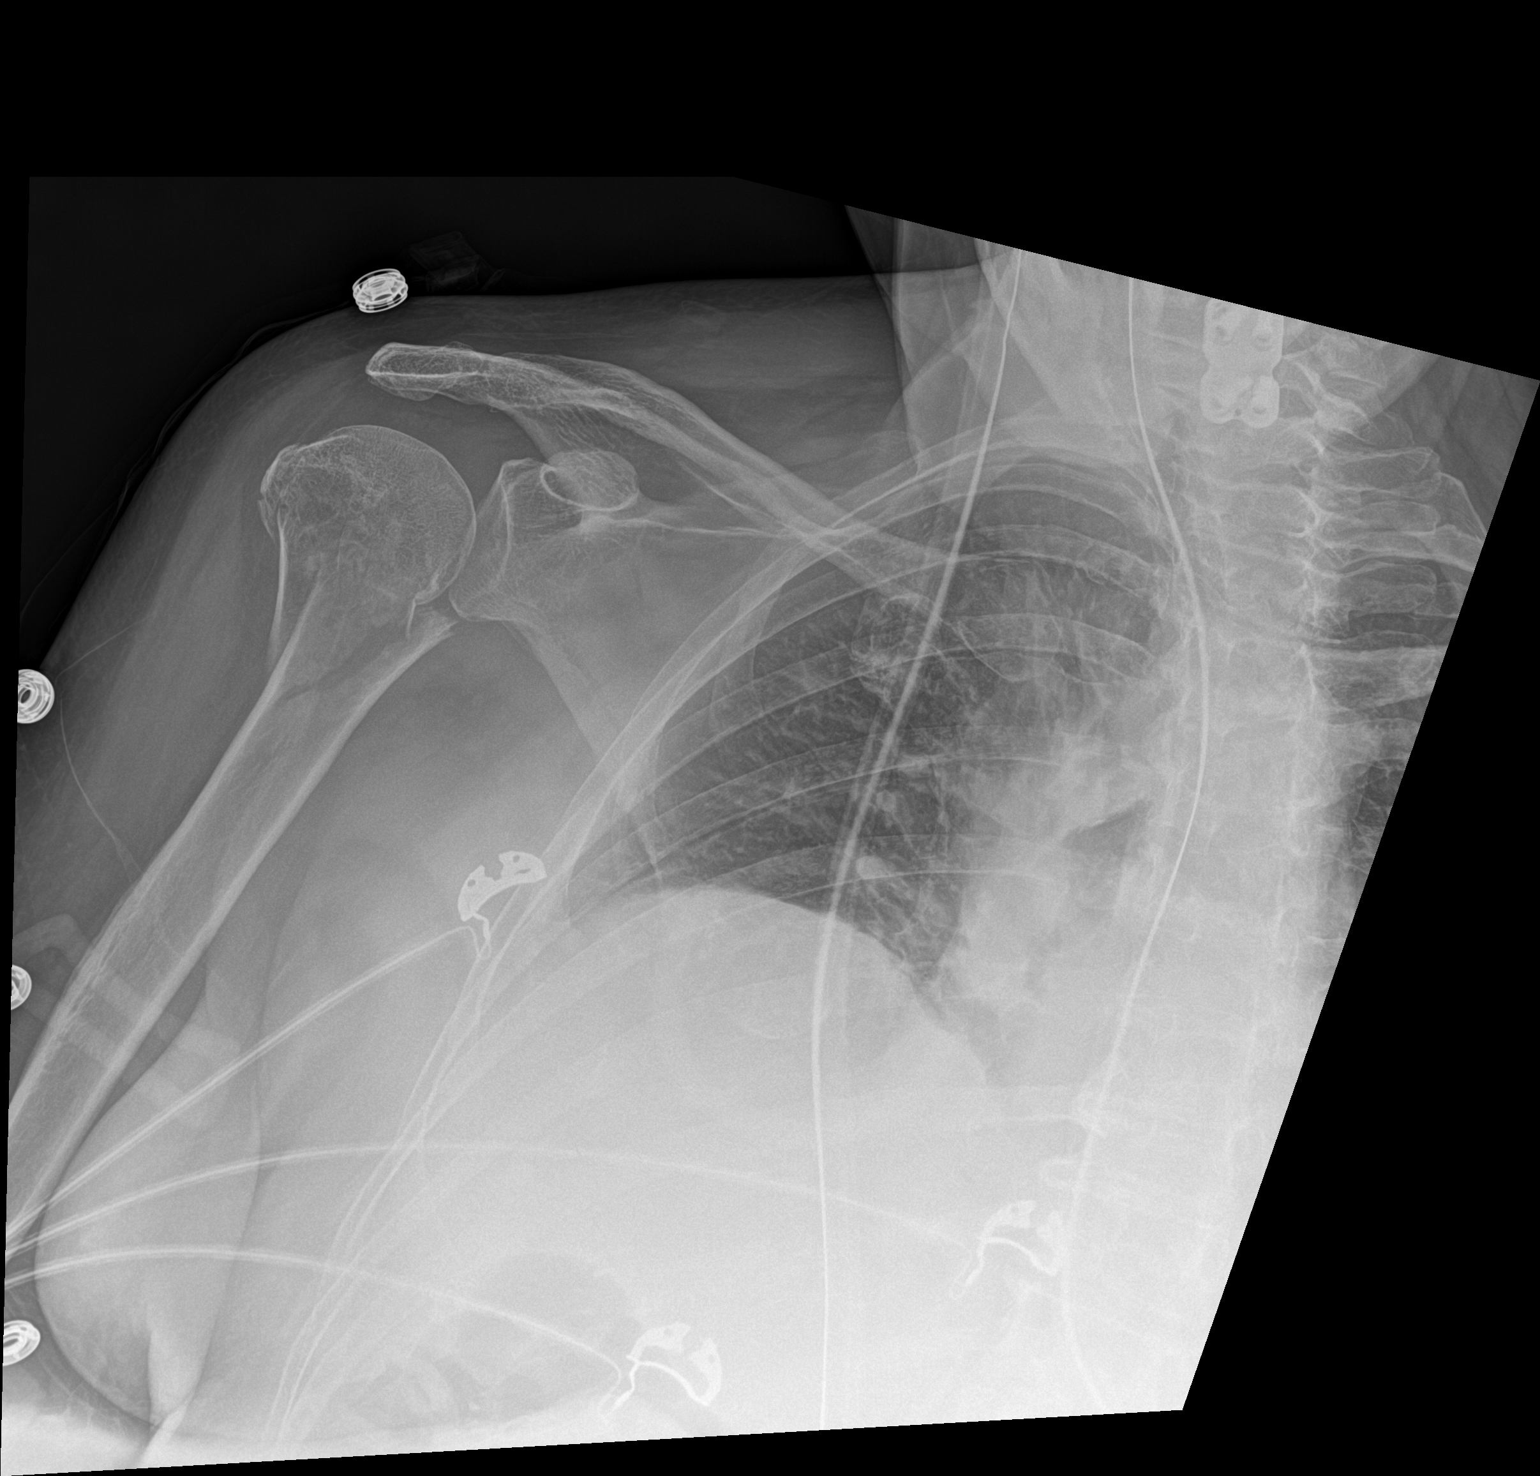

[2 of 2 positions shown; findings below may reference images not displayed]

FINDINGS: Frontal and oblique views were obtained. There is a comminuted
fracture of the proximal humeral metaphysis with medial displacement
and lateral angulation of the distal major fragment with respect to
the major proximal fragment. There are fractures of the greater
tuberosity with mild avulsion in this area. No gross dislocation. No
appreciable joint space narrowing or erosion.
IMPRESSION: Comminuted fracture proximal right humerus with involvement of the
proximal humeral metaphysis and greater tuberosity. At the proximal
metaphysis level, there is medial displacement and lateral
angulation distally. Mild avulsion of the greater tuberosity. No
gross dislocation. No appreciable joint space narrowing.

## 2021-10-19 ENCOUNTER — Other Ambulatory Visit (HOSPITAL_BASED_OUTPATIENT_CLINIC_OR_DEPARTMENT_OTHER): Payer: Self-pay

## 2021-10-19 ENCOUNTER — Ambulatory Visit: Payer: Medicare Other | Attending: Internal Medicine

## 2021-10-19 DIAGNOSIS — Z23 Encounter for immunization: Secondary | ICD-10-CM

## 2021-10-19 MED ORDER — PFIZER COVID-19 VAC BIVALENT 30 MCG/0.3ML IM SUSP
INTRAMUSCULAR | 0 refills | Status: DC
  Filled 2021-10-19: qty 0.3, 1d supply, fill #0

## 2021-10-19 NOTE — Progress Notes (Signed)
   Covid-19 Vaccination Clinic  Name:  Chloe Young    MRN: [id number] DOB: August 22, 1951  10/19/2021  [name] was observed post Covid-19 immunization for 15 minutes without incident. She was provided with Vaccine Information Sheet and instruction to access the V-Safe system.   [name] was instructed to call 911 with any severe reactions post vaccine: Difficulty breathing  Swelling of face and throat  A fast heartbeat  A bad rash all over body  Dizziness and weakness   Immunizations Administered     Name Date Dose VIS Date Route   Pfizer Covid-19 Vaccine Bivalent Booster 10/19/2021 10:32 AM 0.3 mL 08/24/2021 Intramuscular   Manufacturer: [place]   Lot: [id number]   [place]: [phone]

## 2021-11-07 ENCOUNTER — Other Ambulatory Visit: Payer: Self-pay | Admitting: Internal Medicine

## 2021-11-25 ENCOUNTER — Other Ambulatory Visit: Payer: Self-pay

## 2021-11-25 ENCOUNTER — Telehealth: Payer: Self-pay | Admitting: Internal Medicine

## 2021-11-25 MED ORDER — LYRICA 150 MG PO CAPS: 150.0000 mg | ORAL_CAPSULE | Freq: Two times a day (BID) | ORAL | 5 refills | Status: DC

## 2021-11-25 NOTE — Telephone Encounter (Signed)
1.Medication Requested: LYRICA 150 MG capsule  2. Pharmacy (Name, Street, [facility]): [place], [address]  3. On Med List: yes  4. Last Visit with PCP: 08-25-2021  5. Next visit date with PCP: 01-31-2022

## 2021-11-25 NOTE — Telephone Encounter (Signed)
Faxed today

## 2021-12-04 NOTE — Progress Notes (Signed)
Subjective:    Patient ID: Chloe Young, female    DOB: 09/28/1951, 70 y.o.   MRN: [id number]  This visit occurred during the SARS-CoV-2 public health emergency.  Safety protocols were in place, including screening questions prior to the visit, additional usage of staff PPE, and extensive cleaning of exam room while observing appropriate contact time as indicated for disinfecting solutions.    HPI The patient is here for an acute visit.   Right hip pain -  it started over a month ago. She denies injuries. The pain is worse in the mornings when she gets out of bed - she has to lift the leg to get out of bed.  It gets stiff overnight.   Walking helps and loosens it up.    The pain is the posterior-lateral hip. It is a dull pain.  It hurts to pick up the leg.  Her central lower back does not hurt.  She has some cramping in her lower right leg - posterior-medial aspect.  No N/T in leg.    She has tried ES Tylenol and it helps a little.    Medications and allergies reviewed with patient and updated if appropriate.  Patient Active Problem List   Diagnosis Date Noted   Chronic pain 09/02/2021   Incisional hernia status post component separation & abdominal wall reconstruction with mesh 09/01/2021 09/01/2021   Aortic atherosclerosis ([place]) 08/25/2021   Encounter for chronic pain management 08/25/2021   Pain management contract signed  08/25/21 08/25/2021   ETD (Eustachian tube dysfunction), right 03/30/2021   Parastomal hernia s/p robotic colostomy takedown 03/23/2021 03/24/2021   Diverticulitis of large intestine with perforation and abscess s/p Hartmann/colostomy  11/08/2020 03/23/2021   Chronic nasal congestion 03/10/2020   Lumbar radiculopathy 06/15/2016   Cervicogenic headache 06/15/2016   Osteoporosis 06/15/2016   GERD (gastroesophageal reflux disease) 06/15/2016   Prediabetes 11/25/2013   Nonspecific abnormal electrocardiogram (ECG) (EKG) 05/28/2012   Cervical post-laminectomy  syndrome 03/18/2012   Osteoarthritis of both knees 03/18/2012   Raynaud's syndrome 10/11/2010   NECK PAIN, CHRONIC 04/19/2010   Hyperlipidemia 04/19/2009   Essential hypertension 04/19/2009    Current Outpatient Medications on File Prior to Visit  Medication Sig Dispense Refill   acetaminophen (TYLENOL) 500 MG tablet Take 500-1,000 mg by mouth every 6 (six) hours as needed (for pain.).     alendronate (FOSAMAX) 70 MG tablet TAKE 1 TABLET BY MOUTH  EVERY 7 DAYS . TAKE WITH A  FULL GLASS OF WATER ON AN  EMPTY STOMACH 12 tablet 3   amoxicillin (AMOXIL) 500 MG capsule Take 2,000 mg by mouth See admin instructions. Take 4 capsules (2000 mg) by mouth 1 hour prior to dental appointment     Calcium Carbonate-Vitamin D (CALCIUM + D PO) Take 1 tablet by mouth daily.     carboxymethylcellul-glycerin (LUBRICANT DROPS/DUAL-ACTION) 0.5-0.9 % ophthalmic solution Place 1 drop into both eyes 3 (three) times daily as needed for dry eyes.     cyclobenzaprine (FLEXERIL) 5 MG tablet Take 1-2 tablets (5-10 mg total) by mouth at bedtime as needed for muscle spasms. 30 tablet 2   fexofenadine (ALLEGRA) 180 MG tablet Take 180 mg by mouth in the morning.     fluticasone (FLONASE) 50 MCG/ACT nasal spray USE 1 SPRAY IN BOTH  NOSTRILS TWICE DAILY (Patient taking differently: Place 1 spray into both nostrils 2 (two) times daily as needed for allergies.) 48 g 1   Lidocaine 4 % PTCH Place 1 patch onto  the skin daily as needed (pain (neck)).     LYRICA 150 MG capsule Take 1 capsule (150 mg total) by mouth 2 (two) times daily. 60 capsule 5   metoprolol tartrate (LOPRESSOR) 25 MG tablet Take 0.5 tablets (12.5 mg total) by mouth 2 (two) times daily. 180 tablet 3   Multiple Vitamin (MULITIVITAMIN WITH MINERALS) TABS Take 1 tablet by mouth in the morning.     omeprazole (PRILOSEC) 20 MG capsule TAKE 1 CAPSULE BY MOUTH  DAILY (Patient taking differently: Take 20 mg by mouth in the morning.) 90 capsule 3   oxyCODONE-acetaminophen  (PERCOCET) 7.5-325 MG tablet Take 1 tablet by mouth every 12 (twelve) hours as needed for severe pain (chronic cervicogenic headaches). 40 tablet 0   rosuvastatin (CRESTOR) 10 MG tablet Take 1 tablet (10 mg total) by mouth daily. 90 tablet 3   rosuvastatin (CRESTOR) 5 MG tablet Take 5 mg by mouth daily.     topiramate (TOPAMAX) 100 MG tablet Take 1 tablet (100 mg total) by mouth 2 (two) times daily. 180 tablet 1   triamcinolone (NASACORT) 55 MCG/ACT AERO nasal inhaler Place 2 sprays into the nose daily as needed (allergies).     No current facility-administered medications on file prior to visit.    Past Medical History:  Diagnosis Date   Cervical facet syndrome    Cervical post-laminectomy syndrome    Dr Tessa Ler[facility]   Cervicalgia    Chronic pain syndrome    Closed fracture of proximal end of right humerus 03/08/2021   Family history of adverse reaction to anesthesia    sister slow to wake up and nausea   GERD (gastroesophageal reflux disease)    Headache    due to neck fusion   Hyperlipidemia    joint pain with statin   Hypertension    Osteopenia 06/2016   T score -1.6 FRAX not calculated   Perforated abdominal viscus    Severe sepsis ([place]) 11/08/2020    Past Surgical History:  Procedure Laterality Date   CERVIAL FUSION  12/25/2004   Dr Arnoldo Morale, [place]   COLECTOMY WITH COLOSTOMY CREATION/HARTMANN PROCEDURE  11/08/2020   Procedure: SIGMOID COLECTOMY WITH COLOSTOMY CREATION/HARTMANN PROCEDURE;  Surgeon: Stark Klein, MD;  Location: [facility];  Service: General;;   COLONOSCOPY  12/25/2009    Dr Fuller Plan   COLOSTOMY REVERSAL     02/2021   CYSTOSCOPY N/A 03/23/2021   Procedure: CYSTOSCOPY WITH BILATERAL FIREFLY INJECTION;  Surgeon: Raynelle Bring, MD;  Location: [facility];  Service: Urology;  Laterality: N/A;   ESI  09/03/2013   L 5- S1 ; Dr Jorge Ny HERNIA REPAIR N/A 09/01/2021   Procedure: LYSIS OF ADHESIONS, COMPONENT SEPARATION, BILATERAL TAR RELEASE, INCISIONAL  HERNIA REPAIR WITH MESH, TAP BLOCK BILATERAL, PLACEMENT PREVENA WOUND VAC, PANNICULECTOMY;  Surgeon: [name], MD;  Location: [facility];  Service: General;  Laterality: N/A;  GEN AND LOCAL   INSERTION OF MESH N/A 09/01/2021   Procedure: INSERTION OF MESH;  Surgeon: [name], MD;  Location: [facility];  Service: General;  Laterality: N/A;   JOINT REPLACEMENT  07/26/2011    RIGHT KNEE ARTHROPLASTY   KNEE ARTHROSCOPY     bilaterally; Dr Tonita Cong   LAPAROTOMY N/A 11/08/2020   Procedure: EXPLORATORY LAPAROTOMY;  Surgeon: Stark Klein, MD;  Location: [facility];  Service: General;  Laterality: N/A;   TOTAL KNEE ARTHROPLASTY  01/11/2012   Procedure: TOTAL KNEE ARTHROPLASTY;  Surgeon: Johnn Hai, MD;  Location: [facility];  Service:  Orthopedics;  Laterality: Left;  General with Femoral nerve block    Social History   Socioeconomic History   Marital status: Married    Spouse name: Not on file   Number of children: 1   Years of education: Not on file   Highest education level: Not on file  Occupational History   Occupation: HAIRDRESSER    Employer: [employer]  Tobacco Use   Smoking status: Never   Smokeless tobacco: Never  Vaping Use   Vaping Use: Never used  Substance and Sexual Activity   Alcohol use: Not Currently    Comment: none since 10/2020   Drug use: No   Sexual activity: Yes    Birth control/protection: Post-menopausal  Other Topics Concern   Not on file  Social History Narrative   Not on file   Social Determinants of Health   Financial Resource Strain: Not on file  Food Insecurity: Not on file  Transportation Needs: Not on file  Physical Activity: Not on file  Stress: Not on file  Social Connections: Not on file    Family History  Problem Relation Age of Onset   Diverticulitis Mother 62       with fissures.Marland Kitchenopted for no surgery   Cancer Father 60        malignant brain tumor   Breast cancer Maternal Aunt    Diabetes Maternal Grandfather    Heart failure  Maternal Grandfather    Stroke Maternal Uncle        in 48s   Heart disease Maternal Uncle        X4 ; 1 had MI @ 76   Hypertension Sister    Diverticulitis Sister    Colon cancer Neg Hx    Colon polyps Neg Hx    Esophageal cancer Neg Hx    Rectal cancer Neg Hx    Stomach cancer Neg Hx     Review of Systems     Objective:   Vitals:   12/05/21 1448  BP: 108/60  Pulse: 82  Temp: 98 F (36.7 C)  SpO2: 99%   BP Readings from Last 3 Encounters:  12/05/21 108/60  09/02/21 102/69  08/25/21 100/64   Wt Readings from Last 3 Encounters:  12/05/21 117 lb (53.1 kg)  09/01/21 119 lb 0.8 oz (54 kg)  08/25/21 119 lb (54 kg)   Body mass index is 20.73 kg/m.   Physical Exam Constitutional:      General: She is not in acute distress.    Appearance: Normal appearance. She is not ill-appearing.  HENT:     Head: Normocephalic and atraumatic.  Musculoskeletal:        General: Tenderness (Tenderness with palpation posterior trochanter area on right hip.  No lumbar spine tenderness or right SI joint tenderness) present. No deformity.     Right lower leg: No edema.     Left lower leg: No edema.  Skin:    General: Skin is warm and dry.  Neurological:     Mental Status: She is alert.     Sensory: No sensory deficit.     Motor: Weakness (Left leg weak at times-it catches when she tries to lift her leg) present.           Assessment & Plan:    See Problem List for Assessment and Plan of chronic medical problems.

## 2021-12-05 ENCOUNTER — Ambulatory Visit (INDEPENDENT_AMBULATORY_CARE_PROVIDER_SITE_OTHER): Payer: Medicare Other

## 2021-12-05 ENCOUNTER — Ambulatory Visit (INDEPENDENT_AMBULATORY_CARE_PROVIDER_SITE_OTHER): Payer: Medicare Other | Admitting: Internal Medicine

## 2021-12-05 ENCOUNTER — Other Ambulatory Visit: Payer: Self-pay

## 2021-12-05 ENCOUNTER — Encounter: Payer: Self-pay | Admitting: Internal Medicine

## 2021-12-05 VITALS — BP 108/60 | HR 82 | Temp 98.0°F | Ht 63.0 in | Wt 117.0 lb

## 2021-12-05 DIAGNOSIS — M25551 Pain in right hip: Secondary | ICD-10-CM

## 2021-12-05 MED ORDER — MELOXICAM 15 MG PO TABS: 15.0000 mg | ORAL_TABLET | Freq: Every day | ORAL | 0 refills | Status: DC

## 2021-12-05 NOTE — Progress Notes (Signed)
[facility] [place] Phone: [phone]   Assessment and Plan:     1. Right hip pain 2. Osteoarthritis of right hip, unspecified osteoarthritis type -Chronic with exacerbation, initial sports medicine visit - Likely an acute flare of right hip osteoarthritis based on HPI, physical exam, x-ray - Patient elected for ultrasound-guided CSI to right hip.  Tolerated well per note below - Start HEP for hip strengthening - Can continue meloxicam 15 mg daily x2 weeks - Use Tylenol for breakthrough pain - Reviewed x-ray with patient in room.  My interpretation: Cortical changes along femoral head and acetabulum representative of moderate osteoarthritis.  No acute fracture or dislocation - [place] LIMITED JOINT SPACE STRUCTURES LOW RIGHT(NO LINKED CHARGES)    Procedure: Ultrasound Guided Hip Acetabulofemoral Joint Injection Side: Right Diagnosis: Right hip OA with acute flare [place] Indication:  - accuracy is paramount for diagnosis - to ensure therapeutic efficacy or procedural success - to reduce procedural risk  After PARQ discussed and consent was given verbally. The site was cleaned with chlorhexidine prep. An ultrasound transducer was placed on the anterior thigh/hip.   The acetabular joint, labrum, and femoral shaft were identified.  The neurovascular structures were identified and an approach was found specifically avoiding these structures.  A steroid injection was performed under ultrasound guidance with sterile technique using 2ml of 1% lidocaine without epinephrine and 40 mg of triamcinolone (KENALOG) 40mg /ml. This was well tolerated and resulted in little relief.  Needle was removed and dressing placed and post injection instructions were given including  a discussion of likely return of pain today after the anesthetic wears off (with the possibility of worsened pain) until the steroid starts to work in 1-3 days.    Pt was advised to call or return to clinic if these symptoms worsen or fail to improve as anticipated.  Pertinent previous records reviewed include right hip x-ray 12/05/2021, PCP note 12/05/2021   Follow Up: 2 to 3 weeks for reevaluation.  Could consider piriformis injection versus MRI versus physical therapy   Subjective:    I, Chloe Young, am serving as a Education administrator for Doctor Glennon Mac  Chief Complaint: right hip pain  HPI:  12/05/2021 Patient is a 70 year old female. Patient states that hip pain started over a month ago. She denies injuries. The pain is worse in the mornings when she gets out of bed - she has to lift the leg to get out of bed.  It gets stiff overnight.   Walking helps and loosens it up. The pain is the posterior-lateral hip. It is a dull pain.  It hurts to pick up the leg.  Her central lower back does not hurt.  She has some cramping in her lower right leg - posterior-medial aspect. She has tried ES Tylenol and it helps a little.    12/06/2021 Patient states that she is feeling okay today. Feels better as the day goes on and when she walks her dog. Mostly on her R side. Muscle relaxer did not work. Pain radiates down to her calf     Relevant Historical Information: Osteoporosis, hypertension  Additional pertinent review of systems negative.   Current Outpatient Medications:    acetaminophen (TYLENOL) 500 MG tablet, Take 500-1,000 mg by mouth every 6 (six) hours as needed (for pain.)., Disp: , Rfl:    alendronate (FOSAMAX) 70 MG tablet, TAKE 1 TABLET BY MOUTH  EVERY 7 DAYS . TAKE  WITH A  FULL GLASS OF WATER ON AN  EMPTY STOMACH, Disp: 12 tablet, Rfl: 3   amoxicillin (AMOXIL) 500 MG capsule, Take 2,000 mg by mouth See admin instructions. Take 4 capsules (2000 mg) by mouth 1 hour prior to dental appointment, Disp: , Rfl:    Calcium Carbonate-Vitamin D (CALCIUM + D PO), Take 1 tablet by mouth daily., Disp: , Rfl:    carboxymethylcellul-glycerin (LUBRICANT  DROPS/DUAL-ACTION) 0.5-0.9 % ophthalmic solution, Place 1 drop into both eyes 3 (three) times daily as needed for dry eyes., Disp: , Rfl:    cyclobenzaprine (FLEXERIL) 5 MG tablet, Take 1-2 tablets (5-10 mg total) by mouth at bedtime as needed for muscle spasms., Disp: 30 tablet, Rfl: 2   fexofenadine (ALLEGRA) 180 MG tablet, Take 180 mg by mouth in the morning., Disp: , Rfl:    fluticasone (FLONASE) 50 MCG/ACT nasal spray, USE 1 SPRAY IN BOTH  NOSTRILS TWICE DAILY (Patient taking differently: Place 1 spray into both nostrils 2 (two) times daily as needed for allergies.), Disp: 48 g, Rfl: 1   Lidocaine 4 % PTCH, Place 1 patch onto the skin daily as needed (pain (neck))., Disp: , Rfl:    LYRICA 150 MG capsule, Take 1 capsule (150 mg total) by mouth 2 (two) times daily., Disp: 60 capsule, Rfl: 5   meloxicam (MOBIC) 15 MG tablet, Take 1 tablet (15 mg total) by mouth daily., Disp: 14 tablet, Rfl: 0   metoprolol tartrate (LOPRESSOR) 25 MG tablet, Take 0.5 tablets (12.5 mg total) by mouth 2 (two) times daily., Disp: 180 tablet, Rfl: 3   Multiple Vitamin (MULITIVITAMIN WITH MINERALS) TABS, Take 1 tablet by mouth in the morning., Disp: , Rfl:    omeprazole (PRILOSEC) 20 MG capsule, TAKE 1 CAPSULE BY MOUTH  DAILY (Patient taking differently: Take 20 mg by mouth in the morning.), Disp: 90 capsule, Rfl: 3   oxyCODONE-acetaminophen (PERCOCET) 7.5-325 MG tablet, Take 1 tablet by mouth every 12 (twelve) hours as needed for severe pain (chronic cervicogenic headaches)., Disp: 40 tablet, Rfl: 0   rosuvastatin (CRESTOR) 10 MG tablet, Take 1 tablet (10 mg total) by mouth daily., Disp: 90 tablet, Rfl: 3   rosuvastatin (CRESTOR) 5 MG tablet, Take 5 mg by mouth daily., Disp: , Rfl:    topiramate (TOPAMAX) 100 MG tablet, Take 1 tablet (100 mg total) by mouth 2 (two) times daily., Disp: 180 tablet, Rfl: 1   triamcinolone (NASACORT) 55 MCG/ACT AERO nasal inhaler, Place 2 sprays into the nose daily as needed (allergies).,  Disp: , Rfl:    Objective:     Vitals:   12/06/21 1331  BP: 120/80  Pulse: 93  SpO2: 98%  Weight: 118 lb (53.5 kg)  Height: 5\' 3"  (1.6 m)      Body mass index is 20.9 kg/m.    Physical Exam:    General: awake, alert, and oriented no acute distress, nontoxic Skin: no suspicious lesions or rashes Neuro:sensation intact distally with no dificits, normal muscle tone, no atrophy, strength 5/5 in all tested lower ext groups Psych: normal mood and affect, speech clear  Right hip: No deformity, swelling or wasting ROM Fexion 80, ext 20, IR 20, ER 30 TTP gluteal musculature NTTP over the hip flexors, greater troch,   si joint, lumbar spine Negative log roll with FROM Negative FABER Positive FADIR Negative Piriformis test Negative trendelenberg Gait normal    Electronically signed by:  Chloe YoungMarguerita Merles [facility] 1:59 PM 12/06/21

## 2021-12-05 NOTE — Assessment & Plan Note (Signed)
Acute She has tenderness in her posterior trochanter area with some radiculopathy like symptoms ?  Lumbar radiculopathy versus hip pain with some radiculopathy Start meloxicam 15 mg daily with food for 2 weeks Will refer to sports medicine X-ray of hip Was seeing pain management for lumbar radiculopathy, but no longer sees them and I am prescribing her Lyrica-continue 150 mg twice daily

## 2021-12-05 NOTE — Patient Instructions (Addendum)
    An xray was ordered.     Medications changes include :   meloxicam 15 mg daily - take with food.   Your prescription(s) have been submitted to your pharmacy. Please take as directed and contact our office if you believe you are having problem(s) with the medication(s).   A referral was ordered for sports medicine - right hip pain.       Someone from their office will call you to schedule an appointment.

## 2021-12-06 ENCOUNTER — Other Ambulatory Visit: Payer: Self-pay

## 2021-12-06 ENCOUNTER — Ambulatory Visit: Payer: Self-pay

## 2021-12-06 ENCOUNTER — Ambulatory Visit: Payer: Medicare Other | Admitting: Sports Medicine

## 2021-12-06 VITALS — BP 120/80 | HR 93 | Ht 63.0 in | Wt 118.0 lb

## 2021-12-06 DIAGNOSIS — M1611 Unilateral primary osteoarthritis, right hip: Secondary | ICD-10-CM

## 2021-12-06 DIAGNOSIS — M25551 Pain in right hip: Secondary | ICD-10-CM

## 2021-12-06 NOTE — Patient Instructions (Addendum)
Good to see you  Relative rest 1 week and then slowly begin activity Start meloxicam  Tylenol for breakthrough pain  2/3 week follow up

## 2021-12-22 ENCOUNTER — Ambulatory Visit: Payer: Medicare Other | Admitting: Sports Medicine

## 2021-12-27 ENCOUNTER — Ambulatory Visit: Payer: Medicare Other | Admitting: Sports Medicine

## 2021-12-27 NOTE — Progress Notes (Signed)
Chloe Young D.[place] Phone: [phone]   Assessment and Plan:     1. Osteoarthritis of right hip, unspecified osteoarthritis type -Chronic with exacerbation, subsequent visit - Significantly improved flare of right hip pain after intra-articular CSI to right hip at previous office visit - Discontinue daily meloxicam use and can use remainder as needed for pain flares - Can use Tylenol for day-to-day pain control - Restart activities without restriction  Pertinent previous records reviewed include none   Follow Up: As needed if no improvement or worsening of symptoms.  Could consider repeat CSI after 03/06/2022 if his pain returns   Subjective:   I, Pincus Badder, am serving as a Education administrator for Doctor Glennon Mac  Chief Complaint: right hip pain   HPI:   12/05/2021 Patient is a 71 year old female. Patient states that hip pain started over a month ago. She denies injuries. The pain is worse in the mornings when she gets out of bed - she has to lift the leg to get out of bed.  It gets stiff overnight.   Walking helps and loosens it up. The pain is the posterior-lateral hip. It is a dull pain.  It hurts to pick up the leg.  Her central lower back does not hurt.  She has some cramping in her lower right leg - posterior-medial aspect. She has tried ES Tylenol and it helps a little.     12/06/2021 Patient states that she is feeling okay today. Feels better as the day goes on and when she walks her dog. Mostly on her R side. Muscle relaxer did not work. Pain radiates down to her calf    12/28/2021 Patient states she's doing well.  No concerns.  Has stopped daily meloxicam.   Relevant Historical Information: Osteoporosis, hypertension   Additional pertinent review of systems negative.   Current Outpatient Medications:    acetaminophen (TYLENOL) 500 MG tablet, Take 500-1,000 mg by mouth every 6 (six)  hours as needed (for pain.)., Disp: , Rfl:    alendronate (FOSAMAX) 70 MG tablet, TAKE 1 TABLET BY MOUTH  EVERY 7 DAYS . TAKE WITH A  FULL GLASS OF WATER ON AN  EMPTY STOMACH, Disp: 12 tablet, Rfl: 3   amoxicillin (AMOXIL) 500 MG capsule, Take 2,000 mg by mouth See admin instructions. Take 4 capsules (2000 mg) by mouth 1 hour prior to dental appointment, Disp: , Rfl:    Calcium Carbonate-Vitamin D (CALCIUM + D PO), Take 1 tablet by mouth daily., Disp: , Rfl:    carboxymethylcellul-glycerin (LUBRICANT DROPS/DUAL-ACTION) 0.5-0.9 % ophthalmic solution, Place 1 drop into both eyes 3 (three) times daily as needed for dry eyes., Disp: , Rfl:    cyclobenzaprine (FLEXERIL) 5 MG tablet, Take 1-2 tablets (5-10 mg total) by mouth at bedtime as needed for muscle spasms., Disp: 30 tablet, Rfl: 2   fexofenadine (ALLEGRA) 180 MG tablet, Take 180 mg by mouth in the morning., Disp: , Rfl:    fluticasone (FLONASE) 50 MCG/ACT nasal spray, USE 1 SPRAY IN BOTH  NOSTRILS TWICE DAILY (Patient taking differently: Place 1 spray into both nostrils 2 (two) times daily as needed for allergies.), Disp: 48 g, Rfl: 1   Lidocaine 4 % PTCH, Place 1 patch onto the skin daily as needed (pain (neck))., Disp: , Rfl:    LYRICA 150 MG capsule, Take 1 capsule (150 mg total) by mouth 2 (two) times daily., Disp: 60 capsule, Rfl:  5   meloxicam (MOBIC) 15 MG tablet, Take 1 tablet (15 mg total) by mouth daily., Disp: 14 tablet, Rfl: 0   metoprolol tartrate (LOPRESSOR) 25 MG tablet, Take 0.5 tablets (12.5 mg total) by mouth 2 (two) times daily., Disp: 180 tablet, Rfl: 3   Multiple Vitamin (MULITIVITAMIN WITH MINERALS) TABS, Take 1 tablet by mouth in the morning., Disp: , Rfl:    omeprazole (PRILOSEC) 20 MG capsule, TAKE 1 CAPSULE BY MOUTH  DAILY (Patient taking differently: Take 20 mg by mouth in the morning.), Disp: 90 capsule, Rfl: 3   oxyCODONE-acetaminophen (PERCOCET) 7.5-325 MG tablet, Take 1 tablet by mouth every 12 (twelve) hours as needed  for severe pain (chronic cervicogenic headaches)., Disp: 40 tablet, Rfl: 0   rosuvastatin (CRESTOR) 10 MG tablet, Take 1 tablet (10 mg total) by mouth daily., Disp: 90 tablet, Rfl: 3   rosuvastatin (CRESTOR) 5 MG tablet, Take 5 mg by mouth daily., Disp: , Rfl:    topiramate (TOPAMAX) 100 MG tablet, Take 1 tablet (100 mg total) by mouth 2 (two) times daily., Disp: 180 tablet, Rfl: 1   triamcinolone (NASACORT) 55 MCG/ACT AERO nasal inhaler, Place 2 sprays into the nose daily as needed (allergies)., Disp: , Rfl:    Objective:     Vitals:   12/28/21 1054  BP: 120/80  Pulse: 66  SpO2: 99%  Weight: 119 lb (54 kg)  Height: 5\' 3"  (1.6 m)      Body mass index is 21.08 kg/m.    Physical Exam:    General: awake, alert, and oriented no acute distress, nontoxic Skin: no suspicious lesions or rashes Neuro:sensation intact distally with no dificits, normal muscle tone, no atrophy, strength 5/5 in all tested lower ext groups Psych: normal mood and affect, speech clear  Right hip: No deformity, swelling or wasting ROM Fexion 90, ext 30, IR 45, ER 45 NTTP over the hip flexors, greater troch, glute musculature, si joint, lumbar spine Negative log roll with FROM Negative FABER Negative FADIR Negative Piriformis test Negative trendelenberg Gait normal    Electronically signed by:  Chloe Young D.[facility] 11:08 AM 12/28/21

## 2021-12-28 ENCOUNTER — Other Ambulatory Visit: Payer: Self-pay

## 2021-12-28 ENCOUNTER — Ambulatory Visit: Payer: Medicare Other | Admitting: Sports Medicine

## 2021-12-28 VITALS — BP 120/80 | HR 66 | Ht 63.0 in | Wt 119.0 lb

## 2021-12-28 DIAGNOSIS — M1611 Unilateral primary osteoarthritis, right hip: Secondary | ICD-10-CM

## 2021-12-28 NOTE — Patient Instructions (Signed)
Good to see you  ?As needed follow up  ?

## 2022-01-01 ENCOUNTER — Other Ambulatory Visit: Payer: Self-pay | Admitting: Internal Medicine

## 2022-01-02 ENCOUNTER — Other Ambulatory Visit: Payer: Self-pay | Admitting: Internal Medicine

## 2022-01-08 ENCOUNTER — Other Ambulatory Visit: Payer: Self-pay | Admitting: Internal Medicine

## 2022-01-18 ENCOUNTER — Other Ambulatory Visit: Payer: Self-pay | Admitting: Surgery

## 2022-01-18 ENCOUNTER — Other Ambulatory Visit (HOSPITAL_COMMUNITY): Payer: Self-pay | Admitting: Surgery

## 2022-01-18 DIAGNOSIS — K432 Incisional hernia without obstruction or gangrene: Secondary | ICD-10-CM

## 2022-01-18 DIAGNOSIS — R1030 Lower abdominal pain, unspecified: Secondary | ICD-10-CM

## 2022-01-19 DIAGNOSIS — R1030 Lower abdominal pain, unspecified: Secondary | ICD-10-CM | POA: Diagnosis not present

## 2022-01-25 ENCOUNTER — Ambulatory Visit (HOSPITAL_COMMUNITY)
Admission: RE | Admit: 2022-01-25 | Discharge: 2022-01-25 | Disposition: A | Discharge status: Home / Self Care | Payer: Medicare Other | Source: Ambulatory Visit | Attending: Surgery | Admitting: Surgery

## 2022-01-25 ENCOUNTER — Other Ambulatory Visit: Payer: Self-pay

## 2022-01-25 ENCOUNTER — Encounter (HOSPITAL_COMMUNITY): Payer: Self-pay

## 2022-01-25 DIAGNOSIS — I700 Atherosclerosis of aorta: Secondary | ICD-10-CM | POA: Diagnosis not present

## 2022-01-25 DIAGNOSIS — R1030 Lower abdominal pain, unspecified: Secondary | ICD-10-CM | POA: Diagnosis not present

## 2022-01-25 DIAGNOSIS — R109 Unspecified abdominal pain: Secondary | ICD-10-CM | POA: Diagnosis not present

## 2022-01-25 DIAGNOSIS — K432 Incisional hernia without obstruction or gangrene: Secondary | ICD-10-CM | POA: Diagnosis not present

## 2022-01-25 MED ORDER — IOHEXOL 300 MG/ML  SOLN
100.0000 mL | Freq: Once | INTRAMUSCULAR | Status: AC | PRN
  Administered 2022-01-25: 100 mL via INTRAVENOUS

## 2022-02-14 ENCOUNTER — Other Ambulatory Visit: Payer: Self-pay | Admitting: Internal Medicine

## 2022-02-19 ENCOUNTER — Encounter: Payer: Self-pay | Admitting: Internal Medicine

## 2022-02-19 NOTE — Patient Instructions (Addendum)
° ° ° °  Blood work was ordered.      Medications changes include :   none     Return in about 6 months (around 08/20/2022) for CPE.

## 2022-02-19 NOTE — Progress Notes (Signed)
Subjective:    Patient ID: Chloe Young, female    DOB: Feb 13, 1951, 71 y.o.   MRN: [id number]  This visit occurred during the SARS-CoV-2 public health emergency.  Safety protocols were in place, including screening questions prior to the visit, additional usage of staff PPE, and extensive cleaning of exam room while observing appropriate contact time as indicated for disinfecting solutions.     HPI The patient is here for follow up of their chronic medical problems, including htn, hld, OP, prediabetes, gerd, cervicogenic HA's  Her abdominal pain is better - it is healing well.  She has a little tingling at times.  Has not needed any pain medication for that.   Her right hip pain is gone after the injection from Dr Glennon Mac.     Medications and allergies reviewed with patient and updated if appropriate.  Patient Active Problem List   Diagnosis Date Noted   Right hip pain 12/05/2021   Chronic pain 09/02/2021   Incisional hernia status post component separation & abdominal wall reconstruction with mesh 09/01/2021 09/01/2021   Aortic atherosclerosis ([place]) 08/25/2021   Encounter for chronic pain management 08/25/2021   Pain management contract signed  08/25/21 08/25/2021   ETD (Eustachian tube dysfunction), right 03/30/2021   Parastomal hernia s/p robotic colostomy takedown 03/23/2021 03/24/2021   Diverticulitis of large intestine with perforation and abscess s/p Hartmann/colostomy  11/08/2020 03/23/2021   Chronic nasal congestion 03/10/2020   Lumbar radiculopathy 06/15/2016   Cervicogenic headache 06/15/2016   Osteoporosis 06/15/2016   GERD (gastroesophageal reflux disease) 06/15/2016   Prediabetes 11/25/2013   Nonspecific abnormal electrocardiogram (ECG) (EKG) 05/28/2012   Cervical post-laminectomy syndrome 03/18/2012   Osteoarthritis of both knees 03/18/2012   Raynaud's syndrome 10/11/2010   NECK PAIN, CHRONIC 04/19/2010   Hyperlipidemia 04/19/2009   Essential hypertension  04/19/2009    Current Outpatient Medications on File Prior to Visit  Medication Sig Dispense Refill   acetaminophen (TYLENOL) 500 MG tablet Take 500-1,000 mg by mouth every 6 (six) hours as needed (for pain.).     alendronate (FOSAMAX) 70 MG tablet TAKE 1 TABLET BY MOUTH  EVERY 7 DAYS . TAKE WITH A  FULL GLASS OF WATER ON AN  EMPTY STOMACH 12 tablet 3   amoxicillin (AMOXIL) 500 MG capsule Take 2,000 mg by mouth See admin instructions. Take 4 capsules (2000 mg) by mouth 1 hour prior to dental appointment     Calcium Carbonate-Vitamin D (CALCIUM + D PO) Take 1 tablet by mouth daily.     carboxymethylcellul-glycerin (LUBRICANT DROPS/DUAL-ACTION) 0.5-0.9 % ophthalmic solution Place 1 drop into both eyes 3 (three) times daily as needed for dry eyes.     cyclobenzaprine (FLEXERIL) 5 MG tablet Take 1-2 tablets (5-10 mg total) by mouth at bedtime as needed for muscle spasms. 30 tablet 2   fexofenadine (ALLEGRA) 180 MG tablet Take 180 mg by mouth in the morning.     fluticasone (FLONASE) 50 MCG/ACT nasal spray USE 1 SPRAY IN BOTH  NOSTRILS TWICE DAILY (Patient taking differently: Place 1 spray into both nostrils 2 (two) times daily as needed for allergies.) 48 g 1   Lidocaine 4 % PTCH Place 1 patch onto the skin daily as needed (pain (neck)).     LYRICA 150 MG capsule Take 1 capsule (150 mg total) by mouth 2 (two) times daily. 60 capsule 5   metoprolol tartrate (LOPRESSOR) 25 MG tablet Take 0.5 tablets (12.5 mg total) by mouth 2 (two) times daily. [place]  tablet 3   Multiple Vitamin (MULITIVITAMIN WITH MINERALS) TABS Take 1 tablet by mouth in the morning.     omeprazole (PRILOSEC) 20 MG capsule TAKE 1 CAPSULE BY MOUTH  DAILY 90 capsule 3   oxyCODONE-acetaminophen (PERCOCET) 7.5-325 MG tablet Take 1 tablet by mouth every 12 (twelve) hours as needed for severe pain (chronic cervicogenic headaches). 40 tablet 0   rosuvastatin (CRESTOR) 10 MG tablet Take 1 tablet (10 mg total) by mouth daily. 90 tablet 3    topiramate (TOPAMAX) 100 MG tablet TAKE 1 TABLET BY MOUTH  TWICE DAILY 180 tablet 3   triamcinolone (NASACORT) 55 MCG/ACT AERO nasal inhaler Place 2 sprays into the nose daily as needed (allergies).     No current facility-administered medications on file prior to visit.    Past Medical History:  Diagnosis Date   Cervical facet syndrome    Cervical post-laminectomy syndrome    Dr Tessa [facility]   Cervicalgia    Chronic pain syndrome    Closed fracture of proximal end of right humerus 03/08/2021   Family history of adverse reaction to anesthesia    sister slow to wake up and nausea   GERD (gastroesophageal reflux disease)    Headache    due to neck fusion   Hyperlipidemia    joint pain with statin   Hypertension    Osteopenia 06/2016   T score -1.6 FRAX not calculated   Perforated abdominal viscus    Severe sepsis ([place]) 11/08/2020    Past Surgical History:  Procedure Laterality Date   CERVIAL FUSION  12/25/2004   Dr Arnoldo Morale, [place]   COLECTOMY WITH COLOSTOMY CREATION/HARTMANN PROCEDURE  11/08/2020   Procedure: SIGMOID COLECTOMY WITH COLOSTOMY CREATION/HARTMANN PROCEDURE;  Surgeon: Stark Klein, MD;  Location: [facility];  Service: General;;   COLONOSCOPY  12/25/2009    Dr Fuller Plan   COLOSTOMY REVERSAL     02/2021   CYSTOSCOPY N/A 03/23/2021   Procedure: CYSTOSCOPY WITH BILATERAL FIREFLY INJECTION;  Surgeon: Raynelle Bring, MD;  Location: [facility];  Service: Urology;  Laterality: N/A;   ESI  09/03/2013   L 5- S1 ; Dr Jorge Ny HERNIA REPAIR N/A 09/01/2021   Procedure: LYSIS OF ADHESIONS, COMPONENT SEPARATION, BILATERAL TAR RELEASE, INCISIONAL HERNIA REPAIR WITH MESH, TAP BLOCK BILATERAL, PLACEMENT PREVENA WOUND VAC, PANNICULECTOMY;  Surgeon: [name], MD;  Location: [facility];  Service: General;  Laterality: N/A;  GEN AND LOCAL   INSERTION OF MESH N/A 09/01/2021   Procedure: INSERTION OF MESH;  Surgeon: [name], MD;  Location: [facility];  Service: General;  Laterality:  N/A;   JOINT REPLACEMENT  07/26/2011    RIGHT KNEE ARTHROPLASTY   KNEE ARTHROSCOPY     bilaterally; Dr Tonita Cong   LAPAROTOMY N/A 11/08/2020   Procedure: EXPLORATORY LAPAROTOMY;  Surgeon: Stark Klein, MD;  Location: [facility];  Service: General;  Laterality: N/A;   TOTAL KNEE ARTHROPLASTY  01/11/2012   Procedure: TOTAL KNEE ARTHROPLASTY;  Surgeon: Johnn Hai, MD;  Location: [facility];  Service: Orthopedics;  Laterality: Left;  General with Femoral nerve block    Social History   Socioeconomic History   Marital status: Married    Spouse name: Not on file   Number of children: 1   Years of education: Not on file   Highest education level: Not on file  Occupational History   Occupation: HAIRDRESSER    Employer: [employer]  Tobacco Use   Smoking status: Never   Smokeless tobacco: Never  Vaping Use   Vaping Use: Never used  Substance and Sexual Activity   Alcohol use: Not Currently    Comment: none since 10/2020   Drug use: No   Sexual activity: Yes    Birth control/protection: Post-menopausal  Other Topics Concern   Not on file  Social History Narrative   Not on file   Social Determinants of Health   Financial Resource Strain: Not on file  Food Insecurity: Not on file  Transportation Needs: Not on file  Physical Activity: Not on file  Stress: Not on file  Social Connections: Not on file    Family History  Problem Relation Age of Onset   Diverticulitis Mother 24       with fissures.Marland Kitchenopted for no surgery   Cancer Father 72        malignant brain tumor   Breast cancer Maternal Aunt    Diabetes Maternal Grandfather    Heart failure Maternal Grandfather    Stroke Maternal Uncle        in 13s   Heart disease Maternal Uncle        X4 ; 1 had MI @ 75   Hypertension Sister    Diverticulitis Sister    Colon cancer Neg Hx    Colon polyps Neg Hx    Esophageal cancer Neg Hx    Rectal cancer Neg Hx    Stomach cancer Neg Hx     Review of Systems   Constitutional:  Negative for chills and fever.  HENT:  Positive for congestion.   Respiratory:  Negative for cough, shortness of breath and wheezing.   Cardiovascular:  Negative for chest pain, palpitations and leg swelling.  Neurological:  Positive for headaches. Negative for dizziness, weakness, light-headedness and numbness.      Objective:   Vitals:   02/20/22 1103  BP: 138/84  Pulse: 81  Temp: 98 F (36.7 C)  SpO2: 98%   BP Readings from Last 3 Encounters:  02/20/22 138/84  12/28/21 120/80  12/06/21 120/80   Wt Readings from Last 3 Encounters:  02/20/22 121 lb (54.9 kg)  12/28/21 119 lb (54 kg)  12/06/21 118 lb (53.5 kg)   Body mass index is 21.43 kg/m.   Physical Exam       Lab Results  Component Value Date   WBC 7.6 09/02/2021   HGB 8.6 (L) 09/02/2021   HCT 26.8 (L) 09/02/2021   PLT 191 09/02/2021   GLUCOSE 129 (H) 09/02/2021   CHOL 171 06/01/2021   TRIG 108.0 06/01/2021   HDL 57.30 06/01/2021   LDLDIRECT 156.9 11/25/2013   LDLCALC 92 06/01/2021   ALT 23 06/01/2021   AST 23 06/01/2021   NA 138 09/02/2021   K 4.1 09/02/2021   CL 110 09/02/2021   CREATININE 0.63 09/02/2021   BUN 11 09/02/2021   CO2 23 09/02/2021   TSH 1.18 06/01/2021   INR 1.3 (H) 11/08/2020   HGBA1C 5.6 06/01/2021      Assessment & Plan:    See Problem List for Assessment and Plan of chronic medical problems.

## 2022-02-20 ENCOUNTER — Ambulatory Visit (INDEPENDENT_AMBULATORY_CARE_PROVIDER_SITE_OTHER): Payer: Medicare Other | Admitting: Internal Medicine

## 2022-02-20 ENCOUNTER — Other Ambulatory Visit: Payer: Self-pay

## 2022-02-20 VITALS — BP 138/84 | HR 81 | Temp 98.0°F | Ht 63.0 in | Wt 121.0 lb

## 2022-02-20 DIAGNOSIS — G4486 Cervicogenic headache: Secondary | ICD-10-CM

## 2022-02-20 DIAGNOSIS — K219 Gastro-esophageal reflux disease without esophagitis: Secondary | ICD-10-CM

## 2022-02-20 DIAGNOSIS — E782 Mixed hyperlipidemia: Secondary | ICD-10-CM | POA: Diagnosis not present

## 2022-02-20 DIAGNOSIS — M810 Age-related osteoporosis without current pathological fracture: Secondary | ICD-10-CM

## 2022-02-20 DIAGNOSIS — I10 Essential (primary) hypertension: Secondary | ICD-10-CM | POA: Diagnosis not present

## 2022-02-20 DIAGNOSIS — I700 Atherosclerosis of aorta: Secondary | ICD-10-CM

## 2022-02-20 DIAGNOSIS — R7303 Prediabetes: Secondary | ICD-10-CM

## 2022-02-20 LAB — LIPID PANEL
Cholesterol: 178 mg/dL (ref 0–200)
HDL: 71.7 mg/dL (ref >39.00)
LDL Cholesterol: 91 mg/dL (ref 0–99)
NonHDL: 106.72
Total CHOL/HDL Ratio: 2
Triglycerides: 78 mg/dL (ref 0.0–149.0)
VLDL: 15.6 mg/dL (ref 0.0–40.0)

## 2022-02-20 LAB — CBC WITH DIFFERENTIAL/PLATELET
Basophils Absolute: 0 10*3/uL (ref 0.0–0.1)
Basophils Relative: 0.3 % (ref 0.0–3.0)
Eosinophils Absolute: 0.1 10*3/uL (ref 0.0–0.7)
Eosinophils Relative: 1.1 % (ref 0.0–5.0)
HCT: 36.1 % (ref 36.0–46.0)
Hemoglobin: 11.8 g/dL — ABNORMAL LOW (ref 12.0–15.0)
Lymphocytes Relative: 28.6 % (ref 12.0–46.0)
Lymphs Abs: 1.3 10*3/uL (ref 0.7–4.0)
MCHC: 32.7 g/dL (ref 30.0–36.0)
MCV: 85.8 fl (ref 78.0–100.0)
Monocytes Absolute: 0.4 10*3/uL (ref 0.1–1.0)
Monocytes Relative: 8.5 % (ref 3.0–12.0)
Neutro Abs: 2.7 10*3/uL (ref 1.4–7.7)
Neutrophils Relative %: 61.5 % (ref 43.0–77.0)
Platelets: 211 10*3/uL (ref 150.0–400.0)
RBC: 4.21 Mil/uL (ref 3.87–5.11)
RDW: 16.4 % — ABNORMAL HIGH (ref 11.5–15.5)
WBC: 4.5 10*3/uL (ref 4.0–10.5)

## 2022-02-20 LAB — COMPREHENSIVE METABOLIC PANEL
ALT: 27 U/L (ref 0–35)
AST: 28 U/L (ref 0–37)
Albumin: 4.6 g/dL (ref 3.5–5.2)
Alkaline Phosphatase: 55 U/L (ref 39–117)
BUN: 12 mg/dL (ref 6–23)
CO2: 27 mEq/L (ref 19–32)
Calcium: 9.6 mg/dL (ref 8.4–10.5)
Chloride: 103 mEq/L (ref 96–112)
Creatinine, Ser: 0.67 mg/dL (ref 0.40–1.20)
GFR: 87.72 mL/min (ref >60.00)
Glucose, Bld: 103 mg/dL — ABNORMAL HIGH (ref 70–99)
Potassium: 4 mEq/L (ref 3.5–5.1)
Sodium: 136 mEq/L (ref 135–145)
Total Bilirubin: 0.4 mg/dL (ref 0.2–1.2)
Total Protein: 7.4 g/dL (ref 6.0–8.3)

## 2022-02-20 LAB — HEMOGLOBIN A1C: Hgb A1c MFr Bld: 5.5 % (ref 4.6–6.5)

## 2022-02-20 NOTE — Assessment & Plan Note (Signed)
Chronic Blood pressure well controlled CMP Continue metoprolol 12.5 mg twice daily 

## 2022-02-20 NOTE — Assessment & Plan Note (Addendum)
Chronic Continue Lyrica 150 mg twice daily- will taking it just once a day and we can taper from there is she does not need that dose  Continue Topamax 100 mg twice daily Continue Percocet 7.5-325 mg 1 every 12 hours as needed Continue Flexeril 5-10 mg nightly as needed She is taking medication appropriately and medications do help with her chronic pain

## 2022-02-20 NOTE — Assessment & Plan Note (Signed)
Chronic Check a1c Low sugar / carb diet Stressed regular exercise  

## 2022-02-20 NOTE — Assessment & Plan Note (Signed)
Chronic GERD controlled Continue omeprazole 20 mg daily  

## 2022-02-20 NOTE — Assessment & Plan Note (Signed)
Chronic Continue Fosamax 70 mg weekly-started 09/2019 Encourage regular exercise Continue calcium and vitamin D daily Check vitamin D level

## 2022-02-20 NOTE — Assessment & Plan Note (Signed)
Chronic Regular exercise and healthy diet encouraged Check lipid panel  Continue Crestor 10 mg daily 

## 2022-02-20 NOTE — Assessment & Plan Note (Signed)
Chronic Check lipid panel Continue Crestor 10 mg daily Healthy diet, regular exercise encouraged

## 2022-02-21 LAB — VITAMIN D 25 HYDROXY (VIT D DEFICIENCY, FRACTURES): VITD: 32.09 ng/mL (ref 30.00–100.00)

## 2022-03-06 ENCOUNTER — Encounter: Payer: Self-pay | Admitting: Internal Medicine

## 2022-03-07 NOTE — Telephone Encounter (Signed)
Pt is calling to see if she should decrease mg on Lyrica 150 mg. She is due for a refill in the next day and wants to know before then... ? ?Please update pt [phone] ?

## 2022-03-08 MED ORDER — PREGABALIN 50 MG PO CAPS: 50.0000 mg | ORAL_CAPSULE | Freq: Two times a day (BID) | ORAL | 0 refills | Status: DC

## 2022-04-03 MED ORDER — PREGABALIN 150 MG PO CAPS: 150.0000 mg | ORAL_CAPSULE | Freq: Two times a day (BID) | ORAL | 0 refills | Status: DC

## 2022-04-03 NOTE — Addendum Note (Signed)
Addended by: Binnie Rail on: 04/03/2022 08:47 PM ? ? Modules accepted: Orders ? ?

## 2022-04-27 ENCOUNTER — Other Ambulatory Visit: Payer: Self-pay | Admitting: Internal Medicine

## 2022-05-31 ENCOUNTER — Other Ambulatory Visit: Payer: Self-pay | Admitting: Internal Medicine

## 2022-06-06 DIAGNOSIS — Z1231 Encounter for screening mammogram for malignant neoplasm of breast: Secondary | ICD-10-CM | POA: Diagnosis not present

## 2022-06-06 LAB — HM MAMMOGRAPHY

## 2022-06-19 ENCOUNTER — Other Ambulatory Visit: Payer: Self-pay | Admitting: Internal Medicine

## 2022-06-28 ENCOUNTER — Encounter: Payer: Self-pay | Admitting: Internal Medicine

## 2022-06-28 NOTE — Progress Notes (Signed)
Outside notes received. Information abstracted. Notes sent to scan.  

## 2022-06-30 ENCOUNTER — Other Ambulatory Visit: Payer: Self-pay | Admitting: Internal Medicine

## 2022-07-17 ENCOUNTER — Ambulatory Visit (INDEPENDENT_AMBULATORY_CARE_PROVIDER_SITE_OTHER): Payer: Medicare Other

## 2022-07-17 DIAGNOSIS — Z0000 Encounter for general adult medical examination without abnormal findings: Secondary | ICD-10-CM | POA: Diagnosis not present

## 2022-07-17 NOTE — Progress Notes (Signed)
I connected with Chloe Young today by telephone and verified that I am speaking with the correct person using two identifiers. Location patient: home Location provider: work Persons participating in the virtual visit: patient, provider.   I discussed the limitations, risks, security and privacy concerns of performing an evaluation and management service by telephone and the availability of in person appointments. I also discussed with the patient that there may be a patient responsible charge related to this service. The patient expressed understanding and verbally consented to this telephonic visit.    Interactive audio and video telecommunications were attempted between this provider and patient, however failed, due to patient having technical difficulties OR patient did not have access to video capability.  We continued and completed visit with audio only.  Some vital signs may be absent or patient reported.   Time Spent with patient on telephone encounter: 30 minutes  Subjective:   Chloe Young is a 71 y.o. female who presents for Medicare Annual (Subsequent) preventive examination.  Review of Systems     Cardiac Risk Factors include: advanced age (>75mn, >>72women);dyslipidemia;hypertension     Objective:    There were no vitals filed for this visit. There is no height or weight on file to calculate BMI.     07/17/2022    3:52 PM 09/01/2021   12:00 PM 08/24/2021   11:16 AM 03/23/2021    6:01 AM 03/17/2021   10:38 AM 03/02/2021    3:00 PM 11/08/2020    4:26 PM  Advanced Directives  Does Patient Have a Medical Advance Directive? No No No No No No No  Would patient like information on creating a medical advance directive? No - Patient declined No - Patient declined  No - Patient declined No - Patient declined Yes (MAU/Ambulatory/Procedural Areas - Information given) No - Patient declined    Current Medications (verified) Outpatient Encounter Medications as of 07/17/2022   Medication Sig   acetaminophen (TYLENOL) 500 MG tablet Take 500-1,000 mg by mouth every 6 (six) hours as needed (for pain.).   alendronate (FOSAMAX) 70 MG tablet TAKE 1 TABLET BY MOUTH  EVERY 7 DAYS . TAKE WITH A  FULL GLASS OF WATER ON AN  EMPTY STOMACH   amoxicillin (AMOXIL) 500 MG capsule Take 2,000 mg by mouth See admin instructions. Take 4 capsules (2000 mg) by mouth 1 hour prior to dental appointment   Calcium Carbonate-Vitamin D (CALCIUM + D PO) Take 1 tablet by mouth daily.   carboxymethylcellul-glycerin (LUBRICANT DROPS/DUAL-ACTION) 0.5-0.9 % ophthalmic solution Place 1 drop into both eyes 3 (three) times daily as needed for dry eyes.   cyclobenzaprine (FLEXERIL) 5 MG tablet Take 1-2 tablets (5-10 mg total) by mouth at bedtime as needed for muscle spasms.   fexofenadine (ALLEGRA) 180 MG tablet Take 180 mg by mouth in the morning.   fluticasone (FLONASE) 50 MCG/ACT nasal spray USE 1 SPRAY IN BOTH  NOSTRILS TWICE DAILY (Patient taking differently: Place 1 spray into both nostrils 2 (two) times daily as needed for allergies.)   Lidocaine 4 % PTCH Place 1 patch onto the skin daily as needed (pain (neck)).   metoprolol tartrate (LOPRESSOR) 25 MG tablet Take 0.5 tablets (12.5 mg total) by mouth 2 (two) times daily.   Multiple Vitamin (MULITIVITAMIN WITH MINERALS) TABS Take 1 tablet by mouth in the morning.   omeprazole (PRILOSEC) 20 MG capsule TAKE 1 CAPSULE BY MOUTH  DAILY   oxyCODONE-acetaminophen (PERCOCET) 7.5-325 MG tablet TAKE ONE TABLET BY MOUTH EVERY  TWELVE HOURS AS NEEDED FOR SEVERE PAIN (CHRONIC CERVICOGENIC HEADACHES)   pregabalin (LYRICA) 150 MG capsule Take 1 capsule (150 mg total) by mouth 2 (two) times daily.   rosuvastatin (CRESTOR) 10 MG tablet TAKE 1 TABLET BY MOUTH  DAILY   topiramate (TOPAMAX) 100 MG tablet TAKE 1 TABLET BY MOUTH  TWICE DAILY   triamcinolone (NASACORT) 55 MCG/ACT AERO nasal inhaler Place 2 sprays into the nose daily as needed (allergies).   No  facility-administered encounter medications on file as of 07/17/2022.    Allergies (verified) Patient has no known allergies.   History: Past Medical History:  Diagnosis Date   Cervical facet syndrome    Cervical post-laminectomy syndrome    Dr T[facility]   Cervicalgia    Chronic pain syndrome    Closed fracture of proximal end of right humerus 03/08/2021   Family history of adverse reaction to anesthesia    sister slow to wake up and nausea   GERD (gastroesophageal reflux disease)    Headache    due to neck fusion   Hyperlipidemia    joint pain with statin   Hypertension    Osteopenia 06/2016   T score -1.6 FRAX not calculated   Perforated abdominal viscus    Severe sepsis ([place]) 11/08/2020   Past Surgical History:  Procedure Laterality Date   CERVIAL FUSION  12/25/2004   Dr Arnoldo Morale, [place]   COLECTOMY WITH COLOSTOMY CREATION/HARTMANN PROCEDURE  11/08/2020   Procedure: SIGMOID COLECTOMY WITH COLOSTOMY CREATION/HARTMANN PROCEDURE;  Surgeon: Stark Klein, MD;  Location: [facility];  Service: General;;   COLONOSCOPY  12/25/2009    Dr Fuller Plan   COLOSTOMY REVERSAL     02/2021   CYSTOSCOPY N/A 03/23/2021   Procedure: CYSTOSCOPY WITH BILATERAL FIREFLY INJECTION;  Surgeon: Raynelle Bring, MD;  Location: [facility];  Service: Urology;  Laterality: N/A;   ESI  09/03/2013   L 5- S1 ; Dr Jorge Ny HERNIA REPAIR N/A 09/01/2021   Procedure: LYSIS OF ADHESIONS, COMPONENT SEPARATION, BILATERAL TAR RELEASE, INCISIONAL HERNIA REPAIR WITH MESH, TAP BLOCK BILATERAL, PLACEMENT PREVENA WOUND VAC, PANNICULECTOMY;  Surgeon: [name], MD;  Location: [facility];  Service: General;  Laterality: N/A;  GEN AND LOCAL   INSERTION OF MESH N/A 09/01/2021   Procedure: INSERTION OF MESH;  Surgeon: [name], MD;  Location: [facility];  Service: General;  Laterality: N/A;   JOINT REPLACEMENT  07/26/2011    RIGHT KNEE ARTHROPLASTY   KNEE ARTHROSCOPY     bilaterally; Dr Tonita Cong   LAPAROTOMY N/A 11/08/2020    Procedure: EXPLORATORY LAPAROTOMY;  Surgeon: Stark Klein, MD;  Location: [facility];  Service: General;  Laterality: N/A;   TOTAL KNEE ARTHROPLASTY  01/11/2012   Procedure: TOTAL KNEE ARTHROPLASTY;  Surgeon: Johnn Hai, MD;  Location: [facility];  Service: Orthopedics;  Laterality: Left;  General with Femoral nerve block   Family History  Problem Relation Age of Onset   Diverticulitis Mother 9       with fissures.Marland Kitchenopted for no surgery   Cancer Father 47        malignant brain tumor   Breast cancer Maternal Aunt    Diabetes Maternal Grandfather    Heart failure Maternal Grandfather    Stroke Maternal Uncle        in 106s   Heart disease Maternal Uncle        X4 ; 1 had MI @ 38   Hypertension Sister    Diverticulitis Sister  Colon cancer Neg Hx    Colon polyps Neg Hx    Esophageal cancer Neg Hx    Rectal cancer Neg Hx    Stomach cancer Neg Hx    Social History   Socioeconomic History   Marital status: Married    Spouse name: Not on file   Number of children: 1   Years of education: Not on file   Highest education level: Not on file  Occupational History   Occupation: HAIRDRESSER    Employer: [employer]  Tobacco Use   Smoking status: Never   Smokeless tobacco: Never  Vaping Use   Vaping Use: Never used  Substance and Sexual Activity   Alcohol use: Not Currently    Comment: none since 10/2020   Drug use: No   Sexual activity: Yes    Birth control/protection: Post-menopausal  Other Topics Concern   Not on file  Social History Narrative   Not on file   Social Determinants of Health   Financial Resource Strain: Low Risk  (07/17/2022)   Overall Financial Resource Strain (CARDIA)    Difficulty of Paying Living Expenses: Not hard at all  Food Insecurity: No Food Insecurity (07/17/2022)   Hunger Vital Sign    Worried About Running Out of Food in the Last Year: Never true    [place] in the Last Year: Never true  Transportation Needs: No  Transportation Needs (07/17/2022)   PRAPARE - Hydrologist (Medical): No    Lack of Transportation (Non-Medical): No  Physical Activity: Sufficiently Active (07/17/2022)   Exercise Vital Sign    Days of Exercise per Week: 7 days    Minutes of Exercise per Session: 60 min  Stress: No Stress Concern Present (07/17/2022)   [place]    Feeling of Stress : Not at all  Social Connections: Moderately Isolated (07/17/2022)   Social Connection and Isolation Panel [NHANES]    Frequency of Communication with Friends and Family: More than three times a week    Frequency of Social Gatherings with Friends and Family: Once a week    Attends Religious Services: Never    Marine scientist or Organizations: No    Attends Music therapist: Never    Marital Status: Married    Tobacco Counseling Counseling given: Not Answered   Clinical Intake:  Pre-visit preparation completed: Yes  Pain : No/denies pain     BMI - recorded: 21.44 (02/20/2022) Nutritional Status: BMI of 19-24  Normal Nutritional Risks: None Diabetes: No  How often do you need to have someone help you when you read instructions, pamphlets, or other written materials from your doctor or pharmacy?: 1 - Never What is the last grade level you completed in school?: HS[employer]; [place]  Diabetic? no  Interpreter Needed?: No  Information entered by :: Lisette Abu, LPN   Activities of Daily Living    07/17/2022    3:57 PM 09/01/2021   12:00 PM  In your present state of health, do you have any difficulty performing the following activities:  Hearing? 0 0  Vision? 0 0  Difficulty concentrating or making decisions? 0 0  Walking or climbing stairs? 0 0  Dressing or bathing? 0 0  Doing errands, shopping? 0 0  Preparing Food and eating ? N   Using the Toilet? N   In the past six months, have you accidently leaked  urine? N  Do you have problems with loss of bowel control? N   Managing your Medications? N   Managing your Finances? N   Housekeeping or managing your Housekeeping? N     Patient Care Team: Binnie Rail, MD as PCP - General (Internal Medicine) Charlton Haws, [facility] as Pharmacist (Pharmacist) [name], MD as Consulting Physician (Gastroenterology) Stark Klein, MD as Consulting Physician (General Surgery) [name], MD as Consulting Physician (Colon and Rectal Surgery) [name], MD as Consulting Physician (Cardiology)  Indicate any recent Medical Services you may have received from other than [facility] providers in the past year (date may be approximate).     Assessment:   This is a routine wellness examination for Chloe Young.  Hearing/Vision screen Hearing Screening - Comments:: Patient denied any hearing difficulty.   No hearing aids.  Vision Screening - Comments:: Patient does wear corrective lenses/contacts.  Eye exam done by: Howard McFarland at [facility] at [employer] [employer] at [place] issues and exercise activities discussed: Current Exercise Habits: Home exercise routine, Type of exercise: walking (walks her dog everyday 1 to 2 miles), Time (Minutes): 60, Frequency (Times/Week): 7, Weekly Exercise (Minutes/Week): 420, Intensity: Moderate, Exercise limited by: None identified   Goals Addressed             This Visit's Progress    Patient declined health goal at this time.        Depression Screen    07/17/2022    3:57 PM 03/30/2021    1:17 PM 03/10/2020   10:29 AM 11/16/2017   12:29 PM 07/31/2017    2:06 PM 07/31/2017    2:05 PM 07/25/2016   10:46 AM  PHQ 2/9 Scores  PHQ - 2 Score 0 0 0 0 0 0 0  PHQ- 9 Score     0      Fall Risk    07/17/2022    3:55 PM 03/30/2021    1:17 PM 03/10/2020   10:28 AM 11/16/2017   12:29 PM 07/31/2017    2:05 PM  Fall Risk   Falls in the past year? 0 0 0 No No  Number falls in past yr: 0 0 0    Injury  with Fall? 0 0 0    Risk for fall due to : No Fall Risks No Fall Risks     Follow up Falls evaluation completed Falls evaluation completed       [place]:  Any stairs in or around the home? Yes  If so, are there any without handrails? No  Home free of loose throw rugs in walkways, pet beds, electrical cords, etc? Yes  Adequate lighting in your home to reduce risk of falls? Yes   ASSISTIVE DEVICES UTILIZED TO PREVENT FALLS:  Life alert? No  Use of a cane, walker or w/c? No  Grab bars in the bathroom? Yes  Shower chair or bench in shower? No  Elevated toilet seat or a handicapped toilet? No   TIMED UP AND GO:  Was the test performed? No .  Length of time to ambulate 10 feet: n/a sec.   Appearance of gait: Gait not evaluated during this visit.  Cognitive Function:        07/17/2022    4:06 PM  6CIT Screen  What Year? 0 points  What month? 0 points  What time? 0 points  Count back from 20 0 points  Months in reverse 0 points  Repeat  phrase 0 points  Total Score 0 points    Immunizations Immunization History  Administered Date(s) Administered   Fluad Quad(high Dose 65+) 09/25/2019, 09/08/2020   Influenza, High Dose Seasonal PF 10/08/2018   Influenza-Unspecified 11/03/2013, 09/29/2014   PFIZER(Purple Top)SARS-COV-2 Vaccination 02/14/2020, 03/09/2020, 10/23/2020   Pfizer Covid-19 Vaccine Bivalent Booster 69yr & up 10/19/2021   Pneumococcal Conjugate-13 06/15/2016   Pneumococcal Polysaccharide-23 07/31/2017   Td 04/19/2009   Zoster Recombinat (Shingrix) 08/09/2018, 10/30/2018   Zoster, Live 06/04/2012    TDAP status: Due, Education has been provided regarding the importance of this vaccine. Advised may receive this vaccine at local pharmacy or Health Dept. Aware to provide a copy of the vaccination record if obtained from local pharmacy or Health Dept. Verbalized acceptance and understanding.  Flu Vaccine status: Up to  date  Pneumococcal vaccine status: Up to date  Covid-19 vaccine status: Completed vaccines  Qualifies for Shingles Vaccine? Yes   Zostavax completed Yes   Shingrix Completed?: Yes  Screening Tests Health Maintenance  Topic Date Due   TETANUS/TDAP  04/20/2019   COVID-19 Vaccine (5 - Pfizer series) 02/19/2022   DEXA SCAN  03/31/2022   INFLUENZA VACCINE  07/25/2022   MAMMOGRAM  06/06/2024   COLONOSCOPY (Pts 45-449yrInsurance coverage will need to be confirmed)  03/03/2031   Pneumonia Vaccine 6593Years old  Completed   Hepatitis C Screening  Completed   Zoster Vaccines- Shingrix  Completed   HPV VACCINES  Aged Out    Health Maintenance  Health Maintenance Due  Topic Date Due   TETANUS/TDAP  04/20/2019   COVID-19 Vaccine (5 - Pfizer series) 02/19/2022   DEXA SCAN  03/31/2022    Colorectal cancer screening: Type of screening: Colonoscopy. Completed 03/02/2021. Repeat every 10 years  Mammogram status: Completed 06/06/2022. Repeat every year  Bone Density status: Completed 03/31/2020. Results reflect: Bone density results: OSTEOPENIA. Repeat every 2-3 years.  Lung Cancer Screening: (Low Dose CT Chest recommended if Age 72-80ears, 30 pack-year currently smoking OR have quit w/in 15years.) does not qualify.   Lung Cancer Screening Referral: no  Additional Screening:  Hepatitis C Screening: does qualify; Completed 06/15/2016  Vision Screening: Recommended annual ophthalmology exams for early detection of glaucoma and other disorders of the eye. Is the patient up to date with their annual eye exam?  Yes  Who is the provider or what is the name of the office in which the patient attends annual eye exams? Howard McFarland, OD. If pt is not established with a provider, would they like to be referred to a provider to establish care? No .   Dental Screening: Recommended annual dental exams for proper oral hygiene  Community Resource Referral / Chronic Care Management: CRR required  this visit?  No   CCM required this visit?  No      Plan:     I have personally reviewed and noted the following in the patient's chart:   Medical and social history Use of alcohol, tobacco or illicit drugs  Current medications and supplements including opioid prescriptions.  Functional ability and status Nutritional status Physical activity Advanced directives List of other physicians Hospitalizations, surgeries, and ER visits in previous 12 months Vitals Screenings to include cognitive, depression, and falls Referrals and appointments  In addition, I have reviewed and discussed with patient certain preventive protocols, quality metrics, and best practice recommendations. A written personalized care plan for preventive services as well as general preventive health recommendations were provided to patient.  Sheral Flow, LPN   9/92/[id number]   Nurse Notes:  There were no vitals filed for this visit. There is no height or weight on file to calculate BMI. Patient stated that she has no issues with gait or balance; does not use any assistive devices. Medications reviewed with patient; yes opioid use noted.

## 2022-07-17 NOTE — Patient Instructions (Signed)
Chloe Young , Thank you for taking time to come for your Medicare Wellness Visit. I appreciate your ongoing commitment to your health goals. Please review the following plan we discussed and let me know if I can assist you in the future.   Screening recommendations/referrals: Colonoscopy: 03/02/2021; due every 10 years Mammogram: 06/06/2022; due every year Bone Density: 03/31/2020; due every 2-3 years Recommended yearly ophthalmology/optometry visit for glaucoma screening and checkup Recommended yearly dental visit for hygiene and checkup  Vaccinations: Influenza vaccine: 08/2021 Pneumococcal vaccine: 06/15/2016, 07/31/2017 Tdap vaccine: 04/19/2009; due every 10 years; not covered by Medicare as preventative but will cover as treatment for an injury Shingles vaccine: 08/09/2018, 10/30/2018   Covid-19: 02/14/2020, 03/09/2020, 10/23/2020, 10/19/2021  Advanced directives: No  Conditions/risks identified: Yes  Next appointment: Please schedule your next Medicare Wellness Visit with your Nurse Health Advisor in 1 year by calling [phone].   Preventive Care 72 Years and Older, Female Preventive care refers to lifestyle choices and visits with your health care provider that can promote health and wellness. What does preventive care include? A yearly physical exam. This is also called an annual well check. Dental exams once or twice a year. Routine eye exams. Ask your health care provider how often you should have your eyes checked. Personal lifestyle choices, including: Daily care of your teeth and gums. Regular physical activity. Eating a healthy diet. Avoiding tobacco and drug use. Limiting alcohol use. Practicing safe sex. Taking low-dose aspirin every day. Taking vitamin and mineral supplements as recommended by your health care provider. What happens during an annual well check? The services and screenings done by your health care provider during your annual well check will depend on your  age, overall health, lifestyle risk factors, and family history of disease. Counseling  Your health care provider may ask you questions about your: Alcohol use. Tobacco use. Drug use. Emotional well-being. Home and relationship well-being. Sexual activity. Eating habits. History of falls. Memory and ability to understand (cognition). Work and work Statistician. Reproductive health. Screening  You may have the following tests or measurements: Height, weight, and BMI. Blood pressure. Lipid and cholesterol levels. These may be checked every 5 years, or more frequently if you are over 21 years old. Skin check. Lung cancer screening. You may have this screening every year starting at age 69 if you have a 30-pack-year history of smoking and currently smoke or have quit within the past 15 years. Fecal occult blood test (FOBT) of the stool. You may have this test every year starting at age 72. Flexible sigmoidoscopy or colonoscopy. You may have a sigmoidoscopy every 5 years or a colonoscopy every 10 years starting at age 72. Hepatitis C blood test. Hepatitis B blood test. Sexually transmitted disease (STD) testing. Diabetes screening. This is done by checking your blood sugar (glucose) after you have not eaten for a while (fasting). You may have this done every 1-3 years. Bone density scan. This is done to screen for osteoporosis. You may have this done starting at age 72. Mammogram. This may be done every 1-2 years. Talk to your health care provider about how often you should have regular mammograms. Talk with your health care provider about your test results, treatment options, and if necessary, the need for more tests. Vaccines  Your health care provider may recommend certain vaccines, such as: Influenza vaccine. This is recommended every year. Tetanus, diphtheria, and acellular pertussis (Tdap, Td) vaccine. You may need a Td booster every 10 years. Zoster vaccine.  You may need this after  age 72. Pneumococcal 13-valent conjugate (PCV13) vaccine. One dose is recommended after age 72. Pneumococcal polysaccharide (PPSV23) vaccine. One dose is recommended after age 67. Talk to your health care provider about which screenings and vaccines you need and how often you need them. This information is not intended to replace advice given to you by your health care provider. Make sure you discuss any questions you have with your health care provider. Document Released: 01/07/2016 Document Revised: 08/30/2016 Document Reviewed: 10/12/2015 Elsevier Interactive Patient Education  2017 [employer] Prevention in the Home Falls can cause injuries. They can happen to people of all ages. There are many things you can do to make your home safe and to help prevent falls. What can I do on the outside of my home? Regularly fix the edges of walkways and driveways and fix any cracks. Remove anything that might make you trip as you walk through a door, such as a raised step or threshold. Trim any bushes or trees on the path to your home. Use bright outdoor lighting. Clear any walking paths of anything that might make someone trip, such as rocks or tools. Regularly check to see if handrails are loose or broken. Make sure that both sides of any steps have handrails. Any raised decks and porches should have guardrails on the edges. Have any leaves, snow, or ice cleared regularly. Use sand or salt on walking paths during winter. Clean up any spills in your garage right away. This includes oil or grease spills. What can I do in the bathroom? Use night lights. Install grab bars by the toilet and in the tub and shower. Do not use towel bars as grab bars. Use non-skid mats or decals in the tub or shower. If you need to sit down in the shower, use a plastic, non-slip stool. Keep the floor dry. Clean up any water that spills on the floor as soon as it happens. Remove soap buildup in the tub or shower  regularly. Attach bath mats securely with double-sided non-slip rug tape. Do not have throw rugs and other things on the floor that can make you trip. What can I do in the bedroom? Use night lights. Make sure that you have a light by your bed that is easy to reach. Do not use any sheets or blankets that are too big for your bed. They should not hang down onto the floor. Have a firm chair that has side arms. You can use this for support while you get dressed. Do not have throw rugs and other things on the floor that can make you trip. What can I do in the kitchen? Clean up any spills right away. Avoid walking on wet floors. Keep items that you use a lot in easy-to-reach places. If you need to reach something above you, use a strong step stool that has a grab bar. Keep electrical cords out of the way. Do not use floor polish or wax that makes floors slippery. If you must use wax, use non-skid floor wax. Do not have throw rugs and other things on the floor that can make you trip. What can I do with my stairs? Do not leave any items on the stairs. Make sure that there are handrails on both sides of the stairs and use them. Fix handrails that are broken or loose. Make sure that handrails are as long as the stairways. Check any carpeting to make sure that it is  firmly attached to the stairs. Fix any carpet that is loose or worn. Avoid having throw rugs at the top or bottom of the stairs. If you do have throw rugs, attach them to the floor with carpet tape. Make sure that you have a light switch at the top of the stairs and the bottom of the stairs. If you do not have them, ask someone to add them for you. What else can I do to help prevent falls? Wear shoes that: Do not have high heels. Have rubber bottoms. Are comfortable and fit you well. Are closed at the toe. Do not wear sandals. If you use a stepladder: Make sure that it is fully opened. Do not climb a closed stepladder. Make sure that  both sides of the stepladder are locked into place. Ask someone to hold it for you, if possible. Clearly mark and make sure that you can see: Any grab bars or handrails. First and last steps. Where the edge of each step is. Use tools that help you move around (mobility aids) if they are needed. These include: Canes. Walkers. Scooters. Crutches. Turn on the lights when you go into a dark area. Replace any light bulbs as soon as they burn out. Set up your furniture so you have a clear path. Avoid moving your furniture around. If any of your floors are uneven, fix them. If there are any pets around you, be aware of where they are. Review your medicines with your doctor. Some medicines can make you feel dizzy. This can increase your chance of falling. Ask your doctor what other things that you can do to help prevent falls. This information is not intended to replace advice given to you by your health care provider. Make sure you discuss any questions you have with your health care provider. Document Released: 10/07/2009 Document Revised: 05/18/2016 Document Reviewed: 01/15/2015 Elsevier Interactive Patient Education  2017 [employer].

## 2022-07-26 ENCOUNTER — Ambulatory Visit (INDEPENDENT_AMBULATORY_CARE_PROVIDER_SITE_OTHER): Payer: Medicare Other | Admitting: Licensed Clinical Social Worker

## 2022-07-26 DIAGNOSIS — Z6379 Other stressful life events affecting family and household: Secondary | ICD-10-CM | POA: Diagnosis not present

## 2022-07-26 DIAGNOSIS — F4320 Adjustment disorder, unspecified: Secondary | ICD-10-CM

## 2022-07-26 NOTE — BH Specialist Note (Signed)
Integrated Behavioral Health Initial In-Person Visit  MRN: [id number] Name: Chloe Young  Number of [place] Clinician visits: 1- Initial Visit  Session Start time: 3903    Session End time: 1350  Total time in minutes: 55   Types of Service: Individual psychotherapy  Interpretor:No. Interpretor Name and Language: NA    Warm Hand Off Completed.        Subjective: Chloe Young is a 71 y.o. female accompanied by  SELF Patient was referred by Sharene Butters  for Caregiver Distress . Patient reports the following symptoms/concerns: feelings of being overwhelmed and despair be the decline of her husband who she is providing care for who has moderate cognitive impairment.     Duration of problem: Several months ; Severity of problem: moderate  Objective: Mood: Depressed and Affect: Appropriate Risk of harm to self or others: No plan to harm self or others  Life Context: Family and Social: Pt resides at her home and is the primary caregiver for her husband .    School/Work: Pt is retired and does not work  Self-Care: Pt is learning self care for the caregiver and assist with care needs and ADL's for her husband  Life Changes: Pt is the primary caregiver for her husband who has significant cogitative impairment .   Patient and/or Family's Strengths/Protective Factors: Concrete supports in place (healthy food, safe environments, etc.)  Goals Addressed: Patient will: Reduce symptoms of: stress Increase knowledge and/or ability of: coping skills and stress reduction  Demonstrate ability to: Increase healthy adjustment to current life circumstances and Increase adequate support systems for patient/family  Progress towards Goals: Ongoing  Interventions: Interventions utilized: Solution-Focused Strategies, Psychoeducation and/or Health Education, and Link to [employer]  Standardized Assessments completed: Not Needed  Patient and/or Family  Response: Pt open to learning to cope effectively with the changes that are happening with caregiver demands, to gain knowledge on her husbands condition to know challenges that lie ahead, to maximize the use of formal and informal support systems .   Patient Centered Plan: Patient is on the following Treatment Plan(s):  Online Web and book to increase awareness of husband's condition and review information provided .  Increase support with caregiver support group, and respite care with either adult day care or in home care .  In addition, discussed and for caregiver to follow up on care for this caregiver on what provides happiness for her and the importance of her health and well-being .  Finally review explore options for the future if long to care is an option .    Assessment: Patient currently experiencing eelings of being overwhelmed and despair be the decline of her husband who she is providing care for who has moderate cognitive impairment.    .   Patient may benefit from Increasing informal and formal support systems, education of her husband condition to understand what lies ahead with caregiver challenges . With increasing that support , for this caregiver to join a support group, and have respite care with either adult day care or in home care .  In addition, discussed and for caregiver to follow up on care for this caregiver on what provides happiness for her and the importance of her health and well-being .  Finally review explore options for the future if long to care is an option .    Plan: Follow up with behavioral health clinician on : As needed  Behavioral recommendations: As stated above to follow up  with community resources  Referral(s): [employer] Resources:  Adult Day Center, Caregiver Support Group, [facility], Elder [employer], [employer], In home Options Care/come, [employer] Online [facility] respite  "From scale of 1-10, how likely are you to follow plan?": 10  Terik Haughey A  Taylor-Paladino, LCSW

## 2022-08-02 ENCOUNTER — Other Ambulatory Visit: Payer: Self-pay | Admitting: Internal Medicine

## 2022-08-03 ENCOUNTER — Other Ambulatory Visit: Payer: Self-pay | Admitting: Internal Medicine

## 2022-08-03 NOTE — Telephone Encounter (Signed)
Check [place] registry last filled 06/30/2022.Marland KitchenJohny Chess

## 2022-08-07 ENCOUNTER — Ambulatory Visit (INDEPENDENT_AMBULATORY_CARE_PROVIDER_SITE_OTHER): Payer: Medicare Other | Admitting: Licensed Clinical Social Worker

## 2022-08-07 ENCOUNTER — Other Ambulatory Visit: Payer: Self-pay | Admitting: Internal Medicine

## 2022-08-07 DIAGNOSIS — F4321 Adjustment disorder with depressed mood: Secondary | ICD-10-CM

## 2022-08-07 NOTE — BH Specialist Note (Signed)
Integrated Behavioral Health Follow Up In-Person Visit  MRN: [id number] Name: Chloe Young  Number of [place] Clinician visits: 2- Second Visit  Session Start time: 0900   Session End time: 7353  Total time in minutes: 55   Types of Service: Individual psychotherapy  Interpretor:No. Interpretor Name and Language: NA   Subjective: Chloe Young is a 71 y.o. female accompanied by  SELF Patient was referred by Sharene Butters for Caregiver Distress. Patient reports the following symptoms/concerns: Dealing with difficult behavior of person she is caring for including agitation and paranoid behavioral  Duration of problem: weeks; Severity of problem: moderate  Objective: Mood: Anxious and Affect: Appropriate and Tearful Risk of harm to self or others: No plan to harm self or others  Life Context: Family and Social: Pt, has moved in a hotel worried that her husband might harm her due to his agitation with his dementia  School/Work: Pt is retired  Research officer, political party: Talked about providing self care, and safety Life Changes: Pt is the primary caregiver for husband who has significant memory impairment.   Patient and/or Family's Strengths/Protective Factors: Concrete supports in place (healthy food, safe environments, etc.)  Goals Addressed: Patient will:  Reduce symptoms of: stress   Increase knowledge and/or ability of: coping skills and stress reduction   Demonstrate ability to: Increase healthy adjustment to current life circumstances  Progress towards Goals: Ongoing  Interventions: Interventions utilized:  Solution-Focused Strategies, Supportive Counseling, and Link to [employer] Assessments completed: Not Needed  Patient and/or Family Response: LCSW discussed ways of dealing with someone who is confused and disoriented that would provide less frustration .  In addition reassurance and distraction can help and other tools.  Furthermore,  we talked about having a conversation with medical provider about possible medication for mood regarding her husband's agitation.   Finally resources for long term plan for support in the home and or out of the home.    Finally safety plan is needed again , with Geriatric behavioral health, ALZ 24 support line, and friends .    Patient Centered Plan: Patient is on the following Treatment Plan(s): Contact Husband medical provider for possible medication for agitation, follow up local support group and resources provided  including adult day center, in home support out of home care .   Assessment: Patient currently experiencing Stress dealing with husband paranoid and agitation behavior towards her.   Patient may benefit from Provided education on how to respond to person that is suspicious, paranoid with confusion  and could benefit with further education, husband may benefit from possible medication.  She needs further support from support group and adult day respite and in home .  We discussed having family meeting to look at long term goal of out of home care of out of home placement   Plan: Follow up with behavioral health clinician on : As needed  Behavioral recommendations: As stated : How to respond to person that is suspicious, paranoid with confusion  and could benefit with further education, husband may benefit from possible medication to deal with agitation.  She needs further support from support group and adult day respite and in homes support .  We discussed having family meeting to look at long term goal of out of home care of out of home placement  Referral(s): Community Resources:  Support Group , ALZ online [facility]  "From scale of 1-10, how likely are you to follow plan?": 9  Emmalena Canny A  Taylor-Paladino, LCSW

## 2022-08-09 ENCOUNTER — Ambulatory Visit: Payer: Self-pay | Admitting: Licensed Clinical Social Worker

## 2022-08-09 ENCOUNTER — Other Ambulatory Visit: Payer: Self-pay | Admitting: Internal Medicine

## 2022-08-09 DIAGNOSIS — M961 Postlaminectomy syndrome, not elsewhere classified: Secondary | ICD-10-CM

## 2022-08-09 DIAGNOSIS — M5416 Radiculopathy, lumbar region: Secondary | ICD-10-CM

## 2022-08-09 MED ORDER — OXYCODONE-ACETAMINOPHEN 7.5-325 MG PO TABS: 1.0000 | ORAL_TABLET | Freq: Three times a day (TID) | ORAL | 0 refills | Status: DC | PRN

## 2022-08-09 NOTE — Patient Outreach (Signed)
  Care Coordination   Initial Visit Note   08/09/2022 Name: Chloe Young MRN: [id number] DOB: May 29, 1951  Chloe Young is a 71 y.o. year old female who sees Burns, Claudina Lick, MD for primary care. I spoke with  Chloe Young by phone today  What matters to the patients health and wellness today? Client wants to manage caregiver stress issues    Goals Addressed             This Visit's Progress    Patient wants to manage caregiver stress issues.       Care Coordination Interventions:  Solution-Focused Strategies employed:  Active listening / Reflection utilized  Emotional Support Provided Discussed Care Coordination program support Reviewed client needs. Reviewed Caregiver stress issues Provided counseling support for client        SDOH assessments and interventions completed:  Yes  SDOH Interventions Today    Flowsheet Row Most Recent Value  SDOH Interventions   Stress Interventions Provide Counseling  [client has caregiver stress issues]  Social Connections Interventions Other (Comment)  [risk of social isolation]        Care Coordination Interventions Activated:  Yes  Care Coordination Interventions:  Yes, provided   Follow up plan: Follow up call scheduled for 08/23/22 at 10;00 AM    Encounter Outcome:  Pt. Visit Completed

## 2022-08-09 NOTE — Patient Instructions (Addendum)
Visit Information  Thank you for taking time to visit with me today. Please don't hesitate to contact me if I can be of assistance to you before our next scheduled telephone appointment.  Following are the goals we discussed today:   Our next appointment is by telephone on 08/23/22 at 10:00 AM   Please call the care guide team at [phone] if you need to cancel or reschedule your appointment.   If you are experiencing a Mental Health or [place] or need someone to talk to, please go to [facility] [phone])   Following is a copy of your full plan of care:   Care Coordination Interventions:  Solution-Focused Strategies employed:  Active listening / Reflection utilized  Emotional Support Provided Discussed Care Coordination program support Reviewed client needs. Reviewed Caregiver stress issues Provided counseling support for client   Ms. Leonhart was given information about Care Management services by the embedded care coordination team including:  Care Management services include personalized support from designated clinical staff supervised by her physician, including individualized plan of care and coordination with other care providers 24/7 contact phone numbers for assistance for urgent and routine care needs. The patient may stop CCM services at any time (effective at the end of the month) by phone call to the office staff.  Patient agreed to services and verbal consent obtained.   Norva Riffle.Carsten Carstarphen MSW, [place] Holiday representative [facility] Care Management [phone]

## 2022-08-20 ENCOUNTER — Encounter: Payer: Self-pay | Admitting: Internal Medicine

## 2022-08-20 NOTE — Patient Instructions (Signed)
Blood work was ordered.     Medications changes include :   none   Your prescription(s) have been sent to your pharmacy.    A bone density was ordered for [facility].      Return in about 6 months (around 02/21/2023) for follow up.    Health Maintenance, Female Adopting a healthy lifestyle and getting preventive care are important in promoting health and wellness. Ask your health care provider about: The right schedule for you to have regular tests and exams. Things you can do on your own to prevent diseases and keep yourself healthy. What should I know about diet, weight, and exercise? Eat a healthy diet  Eat a diet that includes plenty of vegetables, fruits, low-fat dairy products, and lean protein. Do not eat a lot of foods that are high in solid fats, added sugars, or sodium. Maintain a healthy weight Body mass index (BMI) is used to identify weight problems. It estimates body fat based on height and weight. Your health care provider can help determine your BMI and help you achieve or maintain a healthy weight. Get regular exercise Get regular exercise. This is one of the most important things you can do for your health. Most adults should: Exercise for at least 150 minutes each week. The exercise should increase your heart rate and make you sweat (moderate-intensity exercise). Do strengthening exercises at least twice a week. This is in addition to the moderate-intensity exercise. Spend less time sitting. Even light physical activity can be beneficial. Watch cholesterol and blood lipids Have your blood tested for lipids and cholesterol at 72 years of age, then have this test every 5 years. Have your cholesterol levels checked more often if: Your lipid or cholesterol levels are high. You are older than 72 years of age. You are at high risk for heart disease. What should I know about cancer screening? Depending on your health history and family history, you may need to  have cancer screening at various ages. This may include screening for: Breast cancer. Cervical cancer. Colorectal cancer. Skin cancer. Lung cancer. What should I know about heart disease, diabetes, and high blood pressure? Blood pressure and heart disease High blood pressure causes heart disease and increases the risk of stroke. This is more likely to develop in people who have high blood pressure readings or are overweight. Have your blood pressure checked: Every 3-5 years if you are 45-76 years of age. Every year if you are 55 years old or older. Diabetes Have regular diabetes screenings. This checks your fasting blood sugar level. Have the screening done: Once every three years after age 38 if you are at a normal weight and have a low risk for diabetes. More often and at a younger age if you are overweight or have a high risk for diabetes. What should I know about preventing infection? Hepatitis B If you have a higher risk for hepatitis B, you should be screened for this virus. Talk with your health care provider to find out if you are at risk for hepatitis B infection. Hepatitis C Testing is recommended for: Everyone born from [birth year] through 1966. Anyone with known risk factors for hepatitis C. Sexually transmitted infections (STIs) Get screened for STIs, including gonorrhea and chlamydia, if: You are sexually active and are younger than 72 years of age. You are older than 72 years of age and your health care provider tells you that you are at risk for this type of infection. Your  sexual activity has changed since you were last screened, and you are at increased risk for chlamydia or gonorrhea. Ask your health care provider if you are at risk. Ask your health care provider about whether you are at high risk for HIV. Your health care provider may recommend a prescription medicine to help prevent HIV infection. If you choose to take medicine to prevent HIV, you should first get tested for  HIV. You should then be tested every 3 months for as long as you are taking the medicine. Pregnancy If you are about to stop having your period (premenopausal) and you may become pregnant, seek counseling before you get pregnant. Take 400 to 800 micrograms (mcg) of folic acid every day if you become pregnant. Ask for birth control (contraception) if you want to prevent pregnancy. Osteoporosis and menopause Osteoporosis is a disease in which the bones lose minerals and strength with aging. This can result in bone fractures. If you are 7 years old or older, or if you are at risk for osteoporosis and fractures, ask your health care provider if you should: Be screened for bone loss. Take a calcium or vitamin D supplement to lower your risk of fractures. Be given hormone replacement therapy (HRT) to treat symptoms of menopause. Follow these instructions at home: Alcohol use Do not drink alcohol if: Your health care provider tells you not to drink. You are pregnant, may be pregnant, or are planning to become pregnant. If you drink alcohol: Limit how much you have to: 0-1 drink a day. Know how much alcohol is in your drink. In the [place] one drink equals one 12 oz bottle of beer (355 mL), one 5 oz glass of wine (148 mL), or one 1 oz glass of hard liquor (44 mL). Lifestyle Do not use any products that contain nicotine or tobacco. These products include cigarettes, chewing tobacco, and vaping devices, such as e-cigarettes. If you need help quitting, ask your health care provider. Do not use street drugs. Do not share needles. Ask your health care provider for help if you need support or information about quitting drugs. General instructions Schedule regular health, dental, and eye exams. Stay current with your vaccines. Tell your health care provider if: You often feel depressed. You have ever been abused or do not feel safe at home. Summary Adopting a healthy lifestyle and getting preventive  care are important in promoting health and wellness. Follow your health care provider's instructions about healthy diet, exercising, and getting tested or screened for diseases. Follow your health care provider's instructions on monitoring your cholesterol and blood pressure. This information is not intended to replace advice given to you by your health care provider. Make sure you discuss any questions you have with your health care provider. Document Revised: 05/02/2021 Document Reviewed: 05/02/2021 Elsevier Patient Education  [place].

## 2022-08-20 NOTE — Progress Notes (Unsigned)
Subjective:    Patient ID: Chloe Young, female    DOB: 1951/04/30, 71 y.o.   MRN: [id number]      HPI Chloe Young is here for a Physical exam.    She just got another dog.  She walks the dogs   Her husband has dementia and has been getting angry.     Medications and allergies reviewed with patient and updated if appropriate.  Current Outpatient Medications on File Prior to Visit  Medication Sig Dispense Refill   acetaminophen (TYLENOL) 500 MG tablet Take 500-1,000 mg by mouth every 6 (six) hours as needed (for pain.).     alendronate (FOSAMAX) 70 MG tablet TAKE 1 TABLET BY MOUTH  EVERY 7 DAYS . TAKE WITH A  FULL GLASS OF WATER ON AN  EMPTY STOMACH 12 tablet 3   amoxicillin (AMOXIL) 500 MG capsule Take 2,000 mg by mouth See admin instructions. Take 4 capsules (2000 mg) by mouth 1 hour prior to dental appointment     Calcium Carbonate-Vitamin D (CALCIUM + D PO) Take 1 tablet by mouth daily.     carboxymethylcellul-glycerin (LUBRICANT DROPS/DUAL-ACTION) 0.5-0.9 % ophthalmic solution Place 1 drop into both eyes 3 (three) times daily as needed for dry eyes.     cyclobenzaprine (FLEXERIL) 5 MG tablet Take 1-2 tablets (5-10 mg total) by mouth at bedtime as needed for muscle spasms. 30 tablet 2   fexofenadine (ALLEGRA) 180 MG tablet Take 180 mg by mouth in the morning.     fluticasone (FLONASE) 50 MCG/ACT nasal spray USE 1 SPRAY IN BOTH  NOSTRILS TWICE DAILY (Patient taking differently: Place 1 spray into both nostrils 2 (two) times daily as needed for allergies.) 48 g 1   Lidocaine 4 % PTCH Place 1 patch onto the skin daily as needed (pain (neck)).     methocarbamol (ROBAXIN) 500 MG tablet Take 1 tablet by mouth 4 (four) times daily as needed.     metoprolol tartrate (LOPRESSOR) 25 MG tablet Take 0.5 tablets (12.5 mg total) by mouth 2 (two) times daily. 180 tablet 3   Multiple Vitamin (MULITIVITAMIN WITH MINERALS) TABS Take 1 tablet by mouth in the morning.     omeprazole (PRILOSEC) 20 MG  capsule TAKE 1 CAPSULE BY MOUTH  DAILY 90 capsule 3   oxyCODONE-acetaminophen (PERCOCET) 7.5-325 MG tablet Take 1 tablet by mouth every 8 (eight) hours as needed for severe pain. 40 tablet 0   pregabalin (LYRICA) 150 MG capsule TAKE ONE CAPSULE BY MOUTH TWICE DAILY 60 capsule 0   rosuvastatin (CRESTOR) 10 MG tablet TAKE 1 TABLET BY MOUTH  DAILY 90 tablet 3   topiramate (TOPAMAX) 100 MG tablet TAKE 1 TABLET BY MOUTH  TWICE DAILY 180 tablet 3   triamcinolone (NASACORT) 55 MCG/ACT AERO nasal inhaler Place 2 sprays into the nose daily as needed (allergies).     No current facility-administered medications on file prior to visit.    Review of Systems  Constitutional:  Negative for fever.  Eyes:  Negative for visual disturbance.  Respiratory:  Negative for cough, shortness of breath and wheezing.   Cardiovascular:  Negative for chest pain, palpitations and leg swelling.  Gastrointestinal:  Negative for abdominal pain, blood in stool, constipation, diarrhea and nausea.       Gerd controlled  Genitourinary:  Negative for dysuria.  Musculoskeletal:  Positive for neck pain (chronic). Negative for arthralgias and back pain.  Skin:  Negative for rash.  Neurological:  Positive for headaches. Negative for dizziness  and light-headedness.  Psychiatric/Behavioral:  Negative for dysphoric mood. The patient is not nervous/anxious.        Objective:   Vitals:   08/21/22 1334  BP: 112/80  Pulse: 89  Temp: 97.9 F (36.6 C)  SpO2: 97%   Filed Weights   08/21/22 1334  Weight: 120 lb 2 oz (54.5 kg)   Body mass index is 21.28 kg/m.  BP Readings from Last 3 Encounters:  08/21/22 112/80  02/20/22 138/84  12/28/21 120/80    Wt Readings from Last 3 Encounters:  08/21/22 120 lb 2 oz (54.5 kg)  02/20/22 121 lb (54.9 kg)  12/28/21 119 lb (54 kg)       Physical Exam Constitutional: She appears well-developed and well-nourished. No distress.  HENT:  Head: Normocephalic and atraumatic.  Right  Ear: External ear normal. Normal ear canal and TM Left Ear: External ear normal.  Normal ear canal and TM Mouth/Throat: Oropharynx is clear and moist.  Eyes: Conjunctivae normal.  Neck: Neck supple. No tracheal deviation present. No thyromegaly present.  No carotid bruit  Cardiovascular: Normal rate, regular rhythm and normal heart sounds.   No murmur heard.  No edema. Pulmonary/Chest: Effort normal and breath sounds normal. No respiratory distress. She has no wheezes. She has no rales.  Breast: deferred   Abdominal: Soft. She exhibits no distension. There is no tenderness.  Lymphadenopathy: She has no cervical adenopathy.  Skin: Skin is warm and dry. She is not diaphoretic.  Psychiatric: She has a normal mood and affect. Her behavior is normal.     Lab Results  Component Value Date   WBC 4.5 02/20/2022   HGB 11.8 (L) 02/20/2022   HCT 36.1 02/20/2022   PLT 211.0 02/20/2022   GLUCOSE 103 (H) 02/20/2022   CHOL 178 02/20/2022   TRIG 78.0 02/20/2022   HDL 71.70 02/20/2022   LDLDIRECT 156.9 11/25/2013   LDLCALC 91 02/20/2022   ALT 27 02/20/2022   AST 28 02/20/2022   NA 136 02/20/2022   K 4.0 02/20/2022   CL 103 02/20/2022   CREATININE 0.67 02/20/2022   BUN 12 02/20/2022   CO2 27 02/20/2022   TSH 1.18 06/01/2021   INR 1.3 (H) 11/08/2020   HGBA1C 5.5 02/20/2022         Assessment & Plan:   Physical exam: Screening blood work  ordered Exercise  walks dog daily Weight   normal Substance abuse  none   Reviewed recommended immunizations.   Health Maintenance  Topic Date Due   TETANUS/TDAP  04/20/2019   COVID-19 Vaccine (5 - Pfizer series) 02/19/2022   DEXA SCAN  03/31/2022   INFLUENZA VACCINE  07/25/2022   MAMMOGRAM  06/06/2024   COLONOSCOPY (Pts 45-31yr Insurance coverage will need to be confirmed)  03/03/2031   Pneumonia Vaccine 72 Years old  Completed   Hepatitis C Screening  Completed   Zoster Vaccines- Shingrix  Completed   HPV VACCINES  Aged Out           See Problem List for Assessment and Plan of chronic medical problems.

## 2022-08-21 ENCOUNTER — Encounter: Payer: Self-pay | Admitting: Internal Medicine

## 2022-08-21 ENCOUNTER — Ambulatory Visit (INDEPENDENT_AMBULATORY_CARE_PROVIDER_SITE_OTHER): Payer: Medicare Other | Admitting: Internal Medicine

## 2022-08-21 VITALS — BP 112/80 | HR 89 | Temp 97.9°F | Wt 120.1 lb

## 2022-08-21 DIAGNOSIS — M810 Age-related osteoporosis without current pathological fracture: Secondary | ICD-10-CM | POA: Diagnosis not present

## 2022-08-21 DIAGNOSIS — I700 Atherosclerosis of aorta: Secondary | ICD-10-CM | POA: Diagnosis not present

## 2022-08-21 DIAGNOSIS — G4486 Cervicogenic headache: Secondary | ICD-10-CM | POA: Diagnosis not present

## 2022-08-21 DIAGNOSIS — R7303 Prediabetes: Secondary | ICD-10-CM

## 2022-08-21 DIAGNOSIS — I10 Essential (primary) hypertension: Secondary | ICD-10-CM

## 2022-08-21 DIAGNOSIS — E782 Mixed hyperlipidemia: Secondary | ICD-10-CM

## 2022-08-21 DIAGNOSIS — Z0000 Encounter for general adult medical examination without abnormal findings: Secondary | ICD-10-CM | POA: Diagnosis not present

## 2022-08-21 DIAGNOSIS — K219 Gastro-esophageal reflux disease without esophagitis: Secondary | ICD-10-CM | POA: Diagnosis not present

## 2022-08-21 LAB — CBC WITH DIFFERENTIAL/PLATELET
Basophils Absolute: 0 10*3/uL (ref 0.0–0.1)
Basophils Relative: 0.2 % (ref 0.0–3.0)
Eosinophils Absolute: 0 10*3/uL (ref 0.0–0.7)
Eosinophils Relative: 0.9 % (ref 0.0–5.0)
HCT: 37.1 % (ref 36.0–46.0)
Hemoglobin: 12.4 g/dL (ref 12.0–15.0)
Lymphocytes Relative: 40.5 % (ref 12.0–46.0)
Lymphs Abs: 1.9 10*3/uL (ref 0.7–4.0)
MCHC: 33.4 g/dL (ref 30.0–36.0)
MCV: 89.3 fl (ref 78.0–100.0)
Monocytes Absolute: 0.4 10*3/uL (ref 0.1–1.0)
Monocytes Relative: 8.1 % (ref 3.0–12.0)
Neutro Abs: 2.4 10*3/uL (ref 1.4–7.7)
Neutrophils Relative %: 50.3 % (ref 43.0–77.0)
Platelets: 210 10*3/uL (ref 150.0–400.0)
RBC: 4.15 Mil/uL (ref 3.87–5.11)
RDW: 14.3 % (ref 11.5–15.5)
WBC: 4.7 10*3/uL (ref 4.0–10.5)

## 2022-08-21 LAB — COMPREHENSIVE METABOLIC PANEL
ALT: 21 U/L (ref 0–35)
AST: 28 U/L (ref 0–37)
Albumin: 4.8 g/dL (ref 3.5–5.2)
Alkaline Phosphatase: 49 U/L (ref 39–117)
BUN: 11 mg/dL (ref 6–23)
CO2: 25 mEq/L (ref 19–32)
Calcium: 9.6 mg/dL (ref 8.4–10.5)
Chloride: 102 mEq/L (ref 96–112)
Creatinine, Ser: 0.67 mg/dL (ref 0.40–1.20)
GFR: 87.41 mL/min (ref >60.00)
Glucose, Bld: 91 mg/dL (ref 70–99)
Potassium: 3.7 mEq/L (ref 3.5–5.1)
Sodium: 137 mEq/L (ref 135–145)
Total Bilirubin: 0.5 mg/dL (ref 0.2–1.2)
Total Protein: 7.4 g/dL (ref 6.0–8.3)

## 2022-08-21 LAB — LIPID PANEL
Cholesterol: 171 mg/dL (ref 0–200)
HDL: 77.3 mg/dL (ref >39.00)
LDL Cholesterol: 79 mg/dL (ref 0–99)
NonHDL: 94.17
Total CHOL/HDL Ratio: 2
Triglycerides: 75 mg/dL (ref 0.0–149.0)
VLDL: 15 mg/dL (ref 0.0–40.0)

## 2022-08-21 LAB — HEMOGLOBIN A1C: Hgb A1c MFr Bld: 5.7 % (ref 4.6–6.5)

## 2022-08-21 LAB — VITAMIN D 25 HYDROXY (VIT D DEFICIENCY, FRACTURES): VITD: 31.35 ng/mL (ref 30.00–100.00)

## 2022-08-21 NOTE — Assessment & Plan Note (Signed)
Chronic GERD controlled Continue omeprazole 20 mg daily  

## 2022-08-21 NOTE — Assessment & Plan Note (Signed)
Chronic Check lipid panel  Continue crestor 10 mg  Regular exercise and healthy diet encouraged

## 2022-08-21 NOTE — Assessment & Plan Note (Addendum)
Chronic dexa up to due - ordered On fosamax 70 mg weekly - started 09/2019 - plan for 5 years Continue calcium/vitamin d Stressed regular weight bearing exercise Advised to start light weights

## 2022-08-21 NOTE — Assessment & Plan Note (Signed)
Chronic Continue crestor 10 mg daily Encouraged healthy diet, exercise

## 2022-08-21 NOTE — Assessment & Plan Note (Signed)
Chronic Check a1c Low sugar / carb diet Stressed regular exercise  

## 2022-08-21 NOTE — Assessment & Plan Note (Signed)
Chronic BP well controlled Continue metoprolol 12.5 mg bid cmp

## 2022-08-21 NOTE — Assessment & Plan Note (Signed)
Chronic Pain controlled Continue percocet 7.5-325 mg 1 tab Q 8 hr prn continue lyrica 150 mg bid Continue topamax 100 mg bid Flexeril 5-'10mg'$  HS prn  Uses heat, ice and lidocaine patches

## 2022-08-23 ENCOUNTER — Ambulatory Visit: Payer: Self-pay | Admitting: Licensed Clinical Social Worker

## 2022-08-23 ENCOUNTER — Encounter: Payer: Self-pay | Admitting: Licensed Clinical Social Worker

## 2022-08-23 NOTE — Patient Outreach (Signed)
  Care Coordination   Follow Up Visit Note   08/23/2022 Name: Chloe Young MRN: [id number] DOB: 1951/06/22  Chloe Young is a 71 y.o. year old female who sees Burns, Claudina Lick, MD for primary care. I spoke with  Chloe Young by phone today.  What matters to the patients health and wellness today? Client spoke of caregiver stress issues.    Goals Addressed               This Visit's Progress     patient spoke of caregiver stress related to caring for needs of her spouse (pt-stated)        Care Coordination Interventions:   Active listening / Reflection utilized  Problem Solving [place] strategies reviewed Discussed Care Coordination program support with client Reviewed caregiver stress issues with client.  Provided counseling support for client          SDOH assessments and interventions completed:  Yes     Care Coordination Interventions Activated:  Yes  Care Coordination Interventions:  Yes, provided   Follow up plan: Follow up call scheduled for 09/19/22 at 11:00 AM with LCSW    Encounter Outcome:  Pt. Visit Completed

## 2022-08-23 NOTE — Patient Instructions (Signed)
Visit Information  Thank you for taking time to visit with me today. Please don't hesitate to contact me if I can be of assistance to you before our next scheduled telephone appointment.  Following are the goals we discussed today:   Our next appointment is by telephone on 09/19/22 at 11:00 AM   Please call the care guide team at [phone] if you need to cancel or reschedule your appointment.   If you are experiencing a Mental Health or [place] or need someone to talk to, please go to [facility] [place] [phone])   Following is a copy of your full plan of care:   Care Coordination Interventions:  Solution-Focused Strategies employed:  Active listening / Reflection utilized  Emotional Support Provided Discussed Care Coordination program support Reviewed client needs. Reviewed Caregiver stress issues Provided counseling support for client   Ms. Yeager was given information about Care Management services by the embedded care coordination team including:  Care Management services include personalized support from designated clinical staff supervised by her physician, including individualized plan of care and coordination with other care providers 24/7 contact phone numbers for assistance for urgent and routine care needs. The patient may stop CCM services at any time (effective at the end of the month) by phone call to the office staff.  Patient agreed to services and verbal consent obtained.   Norva Riffle.Val Schiavo MSW, [place] [employer] representative [facility] Care Management [phone]

## 2022-08-29 ENCOUNTER — Other Ambulatory Visit: Payer: Self-pay | Admitting: Internal Medicine

## 2022-09-01 ENCOUNTER — Other Ambulatory Visit: Payer: Self-pay | Admitting: Internal Medicine

## 2022-09-01 DIAGNOSIS — M5416 Radiculopathy, lumbar region: Secondary | ICD-10-CM

## 2022-09-01 DIAGNOSIS — M961 Postlaminectomy syndrome, not elsewhere classified: Secondary | ICD-10-CM

## 2022-09-03 ENCOUNTER — Encounter: Payer: Self-pay | Admitting: Internal Medicine

## 2022-09-05 ENCOUNTER — Other Ambulatory Visit: Payer: Self-pay | Admitting: Internal Medicine

## 2022-09-05 DIAGNOSIS — M5416 Radiculopathy, lumbar region: Secondary | ICD-10-CM

## 2022-09-05 DIAGNOSIS — M961 Postlaminectomy syndrome, not elsewhere classified: Secondary | ICD-10-CM

## 2022-09-19 ENCOUNTER — Ambulatory Visit: Payer: Self-pay | Admitting: Licensed Clinical Social Worker

## 2022-09-19 NOTE — Patient Outreach (Signed)
  Care Coordination   Follow Up Visit Note   09/19/2022 Name: RAHMA MELLER MRN: [id number] DOB: [birth year]  SHAWNIKA PEPIN is a 71 y.o. year old female who sees Burns, Claudina Lick, MD for primary care. I spoke with  [name] by phone today.  What matters to the patients health and wellness today? Client wants to manage caregiver stress issues faced    Goals Addressed               This Visit's Progress     Patient wants to manage caregiver stress issues. (pt-stated)        Care Coordination Interventions:  Solution-Focused Strategies employed:  Active listening / Reflection utilized  Emotional Support Provided Discussed Care Coordination program support Reviewed client needs. Discussed care needs of her spouse. She said she is now residing at home with her spouse. She said her spouse was prescribed a medication, Seroquel, and she thinks that Seroquel prescribed for her spouse is helpful to him.  Discussed safety plan for client and her developing a safety plan for her to use if needed Reviewed Caregiver stress issues Reviewed medication procurement for client Reviewed her discussions with Behavioral Health Specialist and recommendations of Behavioral Health specialist. Reviewed her support with Dr. Quay Burow, her PCP Provided counseling support for client      SDOH assessments and interventions completed:  Yes   Care Coordination Interventions Activated:  Yes  Care Coordination Interventions:  Yes, provided   Follow up plan: Follow up call scheduled for 10/24/22 at 1:00 PM     Encounter Outcome:  Pt. Visit Completed

## 2022-09-19 NOTE — Patient Instructions (Addendum)
Visit Information  Thank you for taking time to visit with me today. Please don't hesitate to contact me if I can be of assistance to you before our next scheduled telephone appointment.  Following are the goals we discussed today:   Our next appointment is by telephone on 10/24/22 at 1:00 PM   Please call the care guide team at [phone] if you need to cancel or reschedule your appointment.   If you are experiencing a Mental Health or [place] or need someone to talk to, please go to [facility] [place] [phone])   Following is a copy of your full plan of care:   Care Coordination Interventions:  Solution-Focused Strategies employed:  Active listening / Reflection utilized  Emotional Support Provided Discussed Care Coordination program support Reviewed client needs. Discussed care needs of her spouse. She said she is now residing at home with her spouse. She said her spouse was prescribed a medication, Seroquel, and she thinks that Seroquel prescribed for her spouse is helpful to him.  Discussed safety plan for client and her developing a safety plan for her to use if needed Reviewed Caregiver stress issues Reviewed medication procurement for client Reviewed her discussions with Behavioral Health Specialist and recommendations of Behavioral Health specialist. Reviewed her support with Dr. Quay Burow, her PCP Provided counseling support for client   Ms. Bourget was given information about Care Management services by the embedded care coordination team including:  Care Management services include personalized support from designated clinical staff supervised by her physician, including individualized plan of care and coordination with other care providers 24/7 contact phone numbers for assistance for urgent and routine care needs. The patient may stop CCM services at any time (effective at the end of the month) by  phone call to the office staff.  Patient agreed to services and verbal consent obtained.   Norva Riffle.Gerado Nabers MSW, [place] [employer] representative [facility] Care Management [phone]

## 2022-10-01 ENCOUNTER — Other Ambulatory Visit: Payer: Self-pay | Admitting: Internal Medicine

## 2022-10-11 ENCOUNTER — Other Ambulatory Visit: Payer: Self-pay | Admitting: Internal Medicine

## 2022-10-11 DIAGNOSIS — M961 Postlaminectomy syndrome, not elsewhere classified: Secondary | ICD-10-CM

## 2022-10-11 DIAGNOSIS — M5416 Radiculopathy, lumbar region: Secondary | ICD-10-CM

## 2022-10-16 ENCOUNTER — Encounter: Payer: Self-pay | Admitting: Internal Medicine

## 2022-10-16 NOTE — Progress Notes (Signed)
Subjective:    Patient ID: Chloe Young, female    DOB: 1951-11-02, 71 y.o.   MRN: [id number]      HPI Chloe Young is here for  Chief Complaint  Patient presents with   Abdominal Pain    Left sided abdominal pain; Says she feels discomfort where colostomy was; Felt pain a few weeks that took her breath away    A couple of weeks ago she was sitting and leaning toward the left and she straightened up and she had pain in the left abdomen - felt like a muscle cramp.  It was transient pain but took her breath away.  Since then she has felt discomfort in that area and can feel where the colostomy was.  She can also feel discomfort lateral to that.  She was concerned about the hernia coming back or something else causing the pain.  She did have a CT scan earlier this year in January because she was having abdominal pain.  CT scan showed no evidence of hernia recurrence or mesh infection. She had an incisional hernia repair 09/2021  Medications and allergies reviewed with patient and updated if appropriate.  Current Outpatient Medications on File Prior to Visit  Medication Sig Dispense Refill   acetaminophen (TYLENOL) 500 MG tablet Take 500-1,000 mg by mouth every 6 (six) hours as needed (for pain.).     alendronate (FOSAMAX) 70 MG tablet TAKE 1 TABLET BY MOUTH  EVERY 7 DAYS . TAKE WITH A  FULL GLASS OF WATER ON AN  EMPTY STOMACH 12 tablet 3   amoxicillin (AMOXIL) 500 MG capsule Take 2,000 mg by mouth See admin instructions. Take 4 capsules (2000 mg) by mouth 1 hour prior to dental appointment     Calcium Carbonate-Vitamin D (CALCIUM + D PO) Take 1 tablet by mouth daily.     carboxymethylcellul-glycerin (LUBRICANT DROPS/DUAL-ACTION) 0.5-0.9 % ophthalmic solution Place 1 drop into both eyes 3 (three) times daily as needed for dry eyes.     cyclobenzaprine (FLEXERIL) 5 MG tablet Take 1-2 tablets (5-10 mg total) by mouth at bedtime as needed for muscle spasms. 30 tablet 2   fexofenadine (ALLEGRA)  180 MG tablet Take 180 mg by mouth in the morning.     fluticasone (FLONASE) 50 MCG/ACT nasal spray USE 1 SPRAY IN BOTH  NOSTRILS TWICE DAILY (Patient taking differently: Place 1 spray into both nostrils 2 (two) times daily as needed for allergies.) 48 g 1   Lidocaine 4 % PTCH Place 1 patch onto the skin daily as needed (pain (neck)).     methocarbamol (ROBAXIN) 500 MG tablet Take 1 tablet by mouth 4 (four) times daily as needed.     metoprolol tartrate (LOPRESSOR) 25 MG tablet Take 0.5 tablets (12.5 mg total) by mouth 2 (two) times daily. 180 tablet 3   Multiple Vitamin (MULITIVITAMIN WITH MINERALS) TABS Take 1 tablet by mouth in the morning.     omeprazole (PRILOSEC) 20 MG capsule TAKE 1 CAPSULE BY MOUTH  DAILY 90 capsule 3   oxyCODONE-acetaminophen (PERCOCET) 7.5-325 MG tablet Take 1 tablet by mouth every 8 (eight) hours as needed for severe pain (for chronic neck pain). 40 tablet 0   pregabalin (LYRICA) 150 MG capsule TAKE ONE CAPSULE BY MOUTH TWICE DAILY 60 capsule 0   rosuvastatin (CRESTOR) 10 MG tablet TAKE 1 TABLET BY MOUTH  DAILY 90 tablet 3   topiramate (TOPAMAX) 100 MG tablet TAKE 1 TABLET BY MOUTH  TWICE DAILY 180 tablet 3  triamcinolone (NASACORT) 55 MCG/ACT AERO nasal inhaler Place 2 sprays into the nose daily as needed (allergies).     No current facility-administered medications on file prior to visit.    Review of Systems  Constitutional:  Negative for chills and fever.  Gastrointestinal:  Positive for abdominal pain and constipation (mild). Negative for blood in stool (no melena), diarrhea and nausea.       No gerd  Genitourinary:  Negative for dysuria, frequency, hematuria and urgency.       Objective:   Vitals:   10/17/22 0803  BP: 128/78  Pulse: 76  Temp: 98.3 F (36.8 C)  SpO2: 99%   BP Readings from Last 3 Encounters:  10/17/22 128/78  08/21/22 112/80  02/20/22 138/84   Wt Readings from Last 3 Encounters:  10/17/22 121 lb 9.6 oz (55.2 kg)  08/21/22 120  lb 2 oz (54.5 kg)  02/20/22 121 lb (54.9 kg)   Body mass index is 21.54 kg/m.    Physical Exam Constitutional:      General: She is not in acute distress.    Appearance: She is well-developed. She is not ill-appearing.  HENT:     Head: Normocephalic and atraumatic.  Abdominal:     Palpations: Abdomen is soft.     Tenderness: There is abdominal tenderness (Minimal tenderness near her scar where colostomy was and left side of abdomen). There is no guarding or rebound.     Hernia: A hernia (Lower abdomen-incisional) is present.  Skin:    General: Skin is warm and dry.     Findings: No erythema or rash.  Neurological:     Mental Status: She is alert.            Assessment & Plan:    See Problem List for Assessment and Plan of chronic medical problems.

## 2022-10-17 ENCOUNTER — Ambulatory Visit (INDEPENDENT_AMBULATORY_CARE_PROVIDER_SITE_OTHER): Payer: Medicare Other | Admitting: Internal Medicine

## 2022-10-17 DIAGNOSIS — I10 Essential (primary) hypertension: Secondary | ICD-10-CM | POA: Diagnosis not present

## 2022-10-17 DIAGNOSIS — R109 Unspecified abdominal pain: Secondary | ICD-10-CM | POA: Insufficient documentation

## 2022-10-17 NOTE — Assessment & Plan Note (Signed)
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  She does have an abdominal wall hernia that is stable and nontender Discussed that if her pain does not improve we can either consider a CT scan or ideally have her see surgery She will monitor let me know

## 2022-10-17 NOTE — Assessment & Plan Note (Signed)
Chronic Blood pressure very well controlled Continue metoprolol 12.5 mg twice daily

## 2022-10-17 NOTE — Patient Instructions (Addendum)
     Monitor your symptoms - if they do not resolve or worsen we can consider having you see surgery or doing imaging to evaluate further.       Return if symptoms worsen or fail to improve.

## 2022-10-24 ENCOUNTER — Ambulatory Visit: Payer: Self-pay | Admitting: Licensed Clinical Social Worker

## 2022-10-24 NOTE — Patient Instructions (Signed)
Visit Information  Thank you for taking time to visit with me today. Please don't hesitate to contact me if I can be of assistance to you before our next scheduled telephone appointment.  Following are the goals we discussed today:   Our next appointment is by telephone on 11/14/22 at 1:00 PM  Please call the care guide team at [phone] if you need to cancel or reschedule your appointment.   If you are experiencing a Mental Health or [place] or need someone to talk to, please go to [facility] [place] [phone])   Following is a copy of your full plan of care:   Care Coordination Interventions:   Called client phone number today but was not able to speak with client. LCSW left phone message asking client to call LCSW back at [phone].  Chloe Young was given information about Care Management services by the embedded care coordination team including:  Care Management services include personalized support from designated clinical staff supervised by her physician, including individualized plan of care and coordination with other care providers 24/7 contact phone numbers for assistance for urgent and routine care needs. The patient may stop CCM services at any time (effective at the end of the month) by phone call to the office staff.  Patient agreed to services and verbal consent obtained.   Chloe Young.Chloe Young MSW, [place] Holiday representative [facility] Care Management [phone]

## 2022-10-24 NOTE — Patient Outreach (Signed)
  Care Coordination   10/24/2022 Name: Chloe Young MRN: [id number] DOB: 1951/03/10   Care Coordination Outreach Attempts:  An unsuccessful telephone outreach was attempted today to offer the patient information about available care coordination services as a benefit of their health plan.   Follow Up Plan:  Additional outreach attempts will be made to offer the patient care coordination information and services.   Encounter Outcome:  No Answer  Care Coordination Interventions Activated:  No   Care Coordination Interventions:  No, not indicated    Norva Riffle.Azzan Butler MSW, [place] Holiday representative [facility] Care Management [phone]

## 2022-10-25 ENCOUNTER — Other Ambulatory Visit (HOSPITAL_BASED_OUTPATIENT_CLINIC_OR_DEPARTMENT_OTHER): Payer: Self-pay

## 2022-10-25 MED ORDER — INFLUENZA VAC A&B SA ADJ QUAD 0.5 ML IM PRSY
PREFILLED_SYRINGE | INTRAMUSCULAR | 0 refills | Status: DC
  Filled 2022-10-25: qty 0.5, 1d supply, fill #0

## 2022-10-25 MED ORDER — COMIRNATY 30 MCG/0.3ML IM SUSY
PREFILLED_SYRINGE | INTRAMUSCULAR | 0 refills | Status: DC
Start: 2022-10-25 — End: 2023-02-21
  Filled 2022-10-25: qty 0.3, 1d supply, fill #0

## 2022-10-27 ENCOUNTER — Other Ambulatory Visit: Payer: Self-pay | Admitting: Internal Medicine

## 2022-11-09 ENCOUNTER — Other Ambulatory Visit: Payer: Self-pay | Admitting: Internal Medicine

## 2022-11-09 DIAGNOSIS — M5416 Radiculopathy, lumbar region: Secondary | ICD-10-CM

## 2022-11-09 DIAGNOSIS — M961 Postlaminectomy syndrome, not elsewhere classified: Secondary | ICD-10-CM

## 2022-11-14 ENCOUNTER — Ambulatory Visit: Payer: Self-pay | Admitting: Licensed Clinical Social Worker

## 2022-11-14 NOTE — Patient Outreach (Signed)
  Care Coordination   11/14/2022 Name: Chloe Young MRN: [id number] DOB: October 22, 1951   Care Coordination Outreach Attempts:  An unsuccessful telephone outreach was attempted today to offer the patient information about available care coordination services as a benefit of their health plan.   Follow Up Plan:  Additional outreach attempts will be made to offer the patient care coordination information and services.   Encounter Outcome:  No Answer  Care Coordination Interventions Activated:  No   Care Coordination Interventions:  No, not indicated    Norva Riffle.Kalla Watson MSW, [place] Holiday representative [facility] Care Management [phone]

## 2022-11-15 ENCOUNTER — Ambulatory Visit: Payer: Self-pay | Admitting: Licensed Clinical Social Worker

## 2022-11-15 NOTE — Patient Instructions (Signed)
Visit Information  Thank you for taking time to visit with me today. Please don't hesitate to contact me if I can be of assistance to you before our next scheduled telephone appointment.  Following are the goals we discussed today:   Our next appointment is by telephone on 12/11/22 2:30 PM   Please call the care guide team at [phone] if you need to cancel or reschedule your appointment.   If you are experiencing a Mental Health or [place] or need someone to talk to, please go to [facility] [place] [phone])   Following is a copy of your full plan of care:   Care Coordination Interventions:   Discussed client needs and discussed needs of her spouse. Client said it is challenging in meeting in home care needs of her spouse. LCSW encouraged client to talk with PCP about her needs and needs of her spouse Client feels that it is hard to manage needs of her spouse. She said her spouse is taking medications as prescribed.  She is trying to determine ways to manage her spouse's needs on daily basis. She does like walking her dogs and walking dogs is a way for her to rest and relax. Discussed caregiver stress. She and LCSW spoke of resources she might read to help her understand more about illness her spouse faces.  Client said she is sometimes exhausted at end of day in managing household needs and needs of her spouse.  Provided counseling support to client Encouraged Chloe Young to call LCSW at [phone] as needed to discuss her care needs and needs of her spouse. Chloe Young was very appreciative of call with LCSW today  Chloe Young was given information about Care Management services by the embedded care coordination team including:  Care Management services include personalized support from designated clinical staff supervised by her physician, including individualized plan of care and coordination with other care  providers 24/7 contact phone numbers for assistance for urgent and routine care needs. The patient may stop CCM services at any time (effective at the end of the month) by phone call to the office staff.  Patient agreed to services and verbal consent obtained.   Norva Riffle.Letecia Arps MSW, [place] Holiday representative [facility] Care Management [phone]

## 2022-11-15 NOTE — Patient Outreach (Signed)
  Care Coordination   Follow Up Visit Note   11/15/2022 Name: Chloe Young MRN: [id number] DOB: 11-28-1951  Chloe Young is a 71 y.o. year old female who sees Burns, Claudina Lick, MD for primary care. I spoke with  [name] by phone today.  What matters to the patients health and wellness today? Client wants to manage her own needs and manage needs of her spouse    Goals Addressed               This Visit's Progress     Previous Goal: patient wants to care for the needs of her spouse as she is able and needs to manage her own medical needs as well (pt-stated)        Care Coordination Interventions:   Discussed client needs and discussed needs of her spouse. Client said it is challenging in meeting in home care needs of her spouse. LCSW encouraged client to talk with PCP about her needs and needs of her spouse Client feels that it is hard to manage needs of her spouse. She said her spouse is taking medications as prescribed.  She is trying to determine ways to manage her spouse's needs on daily basis. She does like walking her dogs and walking dogs is a way for her to rest and relax. Discussed caregiver stress. She and LCSW spoke of resources she might read to help her understand more about illness her spouse faces.  Client said she is sometimes exhausted at end of day in managing household needs and needs of her spouse.  Provided counseling support to client Encouraged Samiyah to call LCSW at [phone] as needed to discuss her care needs and needs of her spouse. Katelynd was very appreciative of call with LCSW today     SDOH assessments and interventions completed:  Yes  SDOH Interventions Today    Flowsheet Row Most Recent Value  SDOH Interventions   Depression Interventions/Treatment  Counseling  [client has stress in managing care needs of her spouse]  Stress Interventions Provide Counseling  [client has stress related to managing in home care needs of her spouse]         Care Coordination Interventions Activated:  Yes  Care Coordination Interventions:  Yes, provided   Follow up plan: Follow up call scheduled for 12/11/22 at 2:30 PM    Encounter Outcome:  Pt. Visit Completed    Norva Riffle.Lanyah Spengler MSW, [place] Holiday representative [facility] Care Management [phone]

## 2022-11-17 ENCOUNTER — Other Ambulatory Visit: Payer: Self-pay | Admitting: Internal Medicine

## 2022-11-30 ENCOUNTER — Other Ambulatory Visit: Payer: Self-pay | Admitting: Internal Medicine

## 2022-12-05 ENCOUNTER — Other Ambulatory Visit: Payer: Self-pay | Admitting: Internal Medicine

## 2022-12-11 ENCOUNTER — Other Ambulatory Visit: Payer: Self-pay | Admitting: Internal Medicine

## 2022-12-11 ENCOUNTER — Ambulatory Visit: Payer: Self-pay | Admitting: Licensed Clinical Social Worker

## 2022-12-11 DIAGNOSIS — M5416 Radiculopathy, lumbar region: Secondary | ICD-10-CM

## 2022-12-11 DIAGNOSIS — M961 Postlaminectomy syndrome, not elsewhere classified: Secondary | ICD-10-CM

## 2022-12-11 NOTE — Patient Outreach (Signed)
  Care Coordination   Follow Up Visit Note   12/11/2022 Name: KALENNA MILLETT MRN: [id number] DOB: [birth year]  DEVA RON is a 71 y.o. year old female who sees Burns, Claudina Lick, MD for primary care. I spoke with  [name] by phone today.  What matters to the patients health and wellness today? Patient wants to manage her own medical needs and also manage in home care needs of her spouse.    Goals Addressed               This Visit's Progress     Previous Goal: patient wants to care for the needs of her spouse as she is able and needs to manage her own medical needs as well (pt-stated)        Care Coordination Interventions:   Discussed client needs and discussed needs of her spouse. Client said it is challenging in meeting in home care needs of her spouse. Discussed caregiver stress. She and LCSW spoke of resources she might read to help her understand more about illness her spouse faces. She said she plans on buying book, The 36 Hour Day and reading it to help her with caring for the needs of her spouse.  Provided counseling support to client Annelle said she might take her spouse to home where he grew up to see if he would like to see that residence where he lived as a child LCSW talked with client about self care, taking naps as needed, and allowing time for herself to have a break from caregiving responsibilities for her spouse Encouraged Chyla to call LCSW at [phone] as needed to discuss her care needs and needs of her spouse. Jackalynn was very appreciative of call with LCSW today     SDOH assessments and interventions completed:  Yes  SDOH Interventions Today    Flowsheet Row Most Recent Value  SDOH Interventions   Depression Interventions/Treatment  Counseling  Stress Interventions Provide Counseling  [stress related to managing her medical needs. Stress related to managing care needs of her spouse]        Care Coordination Interventions:  Yes, provided    Follow up plan: Follow up call scheduled for 01/22/23 at 2:00 PM     Encounter Outcome:  Pt. Visit Completed   Norva Riffle.Shailey Butterbaugh MSW, [place] Holiday representative [facility] Care Management [phone]

## 2022-12-11 NOTE — Patient Instructions (Signed)
Visit Information  Thank you for taking time to visit with me today. Please don't hesitate to contact me if I can be of assistance to you before our next scheduled telephone appointment.  Following are the goals we discussed today:   Our next appointment is by telephone on 01/22/23 at 2:00 PM   Please call the care guide team at [phone] if you need to cancel or reschedule your appointment.   If you are experiencing a Mental Health or [place] or need someone to talk to, please go to [facility] [place] [phone])   Following is a copy of your full plan of care:   Care Coordination Interventions:   Discussed client needs and discussed needs of her spouse. Client said it is challenging in meeting in home care needs of her spouse. Discussed caregiver stress. She and LCSW spoke of resources she might read to help her understand more about illness her spouse faces. She said she plans on buying book, The 36 Hour Day and reading it to help her with caring for the needs of her spouse.  Provided counseling support to client Hamna said she might take her spouse to home where he grew up to see if he would like to see that residence where he lived as a child LCSW talked with client about self care, taking naps as needed, and allowing time for herself to have a break from caregiving responsibilities for her spouse Encouraged Aubrea to call LCSW at [phone] as needed to discuss her care needs and needs of her spouse. Javanna was very appreciative of call with LCSW today  Ms. Newcomer was given information about Care Management services by the embedded care coordination team including:  Care Management services include personalized support from designated clinical staff supervised by her physician, including individualized plan of care and coordination with other care providers 24/7 contact phone numbers for assistance for  urgent and routine care needs. The patient may stop CCM services at any time (effective at the end of the month) by phone call to the office staff.  Patient agreed to services and verbal consent obtained.   Norva Riffle.Avrey Flanagin MSW, [place] Holiday representative [facility] Care Management [phone]

## 2023-01-01 ENCOUNTER — Other Ambulatory Visit: Payer: Self-pay | Admitting: Internal Medicine

## 2023-01-22 ENCOUNTER — Ambulatory Visit: Payer: Self-pay | Admitting: Licensed Clinical Social Worker

## 2023-01-22 NOTE — Patient Outreach (Signed)
  Care Coordination   01/22/2023 Name: Chloe Young MRN: [id number] DOB: Jul 03, 1951   Care Coordination Outreach Attempts:  An unsuccessful telephone outreach was attempted today to offer the patient information about available care coordination services as a benefit of their health plan.   Follow Up Plan:  Additional outreach attempts will be made to offer the patient care coordination information and services.   Encounter Outcome:  No Answer   Care Coordination Interventions:  No, not indicated    Norva Riffle.Tawyna Pellot MSW, [place] Holiday representative [facility] Care Management [phone]

## 2023-01-23 ENCOUNTER — Other Ambulatory Visit: Payer: Self-pay | Admitting: Internal Medicine

## 2023-01-23 DIAGNOSIS — M961 Postlaminectomy syndrome, not elsewhere classified: Secondary | ICD-10-CM

## 2023-01-23 DIAGNOSIS — M5416 Radiculopathy, lumbar region: Secondary | ICD-10-CM

## 2023-01-30 ENCOUNTER — Ambulatory Visit: Payer: Self-pay | Admitting: Licensed Clinical Social Worker

## 2023-01-30 NOTE — Patient Instructions (Signed)
Visit Information  Thank you for taking time to visit with me today. Please don't hesitate to contact me if I can be of assistance to you before our next scheduled telephone appointment.  Following are the goals we discussed today:    [name] to call LCSW in next 2 weeks to update LCSW on status of her spouse's placement in a care facility.   Please call the care guide team at [phone] if you need to cancel or reschedule your appointment.   If you are experiencing a Mental Health or [place] or need someone to talk to, please go to [facility] [place] [phone])   Following is a copy of your full plan of care:   Care Coordination Interventions:   LCSW received incoming call today from [name]. Kori wanted to call LCSW to update LCSW on status of placement of Annella's spouse in care facility.  Miarose informed LCSW today that she is scheduled to have her spouse admitted to [facility] facility in [place], [place] next week. She is going to [facility] facility next Thursday to work on admission documents with facility Maggie said this has been difficult decision but her spouse is refusing to bath, refusing occasionally to take medications, is having behavior issues of concern. Dnya said that sister of her spouse already resides at [facility] facility in [place], [place] and so Riot is hoping this will be a positive benefit for her spouse moving there to facility next week. Provided counseling support for client Encouraged client that she has tried to care for spouse's needs at home for some time and now may need more help such as facility care for her spouse.  She said she has support also from her friends at church LCSW asked Adilynne to please call LCSW in a few weeks once her spouse has been admitted to care facility to discuss how spouse is doing in new facility. Bryttany said she would call LCSW in a few  weeks to update LCSW on status of her spouse and his placement at facility. Kariann was appreciative of call with LCSW today  Ms. Protzman was given information about Care Management services by the embedded care coordination team including:  Care Management services include personalized support from designated clinical staff supervised by her physician, including individualized plan of care and coordination with other care providers 24/7 contact phone numbers for assistance for urgent and routine care needs. The patient may stop CCM services at any time (effective at the end of the month) by phone call to the office staff.  Patient agreed to services and verbal consent obtained.   Norva Riffle.Gracee Ratterree MSW, [place] Holiday representative [facility] Care Management [phone]

## 2023-01-30 NOTE — Patient Outreach (Signed)
  Care Coordination   Follow Up Visit Note   01/30/2023 Name: TALITHA DICARLO MRN: [id number] DOB: [birth year]  [name] is a 72 y.o. year old female who sees Burns, Claudina Lick, MD for primary care. I spoke with  [name] by phone today.  What matters to the patients health and wellness today?  Patient is planning on admitting next week to [facility] facility in [place], [place]               This Visit's Progress     Previous Goal: patient wants to care for the needs of her spouse as she is able and needs to manage her own medical needs as well (pt-stated)        Care Coordination Interventions:   LCSW received incoming call today from [name]. Judianne wanted to call LCSW to update LCSW on status of placement of Brittley's spouse in care facility.  Morganne informed LCSW today that she is scheduled to have her spouse admitted to [facility] facility in [place], [place] next week. She is going to [facility] facility next Thursday to work on admission documents with facility Kessa said this has been difficult decision but her spouse is refusing to bath, refusing occasionally to take medications, is having behavior issues of concern. Rylan said that sister of her spouse already resides at [facility] facility in [place], [place] and so Kesa is hoping this will be a positive benefit for her spouse moving there to facility next week. Provided counseling support for client Encouraged client that she has tried to care for spouse's needs at home for some time and now may need more help such as facility care for her spouse.  She said she has support also from her friends at church LCSW asked Darienne to please call LCSW in a few weeks once her spouse has been admitted to care facility to discuss how spouse is doing in new facility. Dalayza said she would call LCSW in a few weeks to update LCSW on status of her spouse and his placement at facility. Lizbeth was appreciative of call  with LCSW today        SDOH assessments and interventions completed:  Yes  SDOH Interventions Today    Flowsheet Row Most Recent Value  SDOH Interventions   Depression Interventions/Treatment  Counseling  Stress Interventions Provide Counseling  [stress related to managing care needs of her spouse]        Care Coordination Interventions:  Yes, provided   Follow up plan: [name] to call LCSW in next 2 weeks to update LCSW on status of her spouse's placement in care facility    Encounter Outcome:  Pt. Visit Completed   Norva Riffle.Jakai Risse MSW, [place] Holiday representative [facility] Care Management [phone]

## 2023-02-02 ENCOUNTER — Other Ambulatory Visit: Payer: Self-pay | Admitting: Internal Medicine

## 2023-02-07 ENCOUNTER — Ambulatory Visit: Payer: Self-pay | Admitting: Licensed Clinical Social Worker

## 2023-02-07 NOTE — Patient Instructions (Signed)
Visit Information  Thank you for taking time to visit with me today. Please don't hesitate to contact me if I can be of assistance to you before our next scheduled telephone appointment.  Following are the goals we discussed today:    Our next appointment is by telephone on 03/06/23 at 9:30 AM   Please call the care guide team at [phone] if you need to cancel or reschedule your appointment.   If you are experiencing a Mental Health or [place] or need someone to talk to, please go to [facility] [place] [phone])   Following is a copy of your full plan of care:   Care Coordination Interventions:  LCSW called client today via phone and spoke with Aaradhya about her care needs at this time. Discussed plan to admit her spouse to [facility] facility this week in [place]. [place] said that her son is traveling to their home tomorrow and son will help in transition of her spouse to [place] facility Provided counseling support for client Encouraged client that she has tried to care for spouse's needs at home for some time and now may need more help such as facility care for her spouse.   LCSW spoke with Phaith about self care and allowing time for herself to rest and relax.  Sanyla was appreciative of call with LCSW today  Ms. Omar was given information about Care Management services by the embedded care coordination team including:  Care Management services include personalized support from designated clinical staff supervised by her physician, including individualized plan of care and coordination with other care providers 24/7 contact phone numbers for assistance for urgent and routine care needs. The patient may stop CCM services at any time (effective at the end of the month) by phone call to the office staff.  Patient agreed to services and verbal consent obtained.   Norva Riffle.Kimmerly Lora MSW,  [place] Holiday representative [facility] Care Management [phone]

## 2023-02-07 NOTE — Patient Outreach (Signed)
  Care Coordination   Follow Up Visit Note   02/07/2023 Name: Chloe Young MRN: [id number] DOB: 02/08/1951  Chloe Young is a 72 y.o. year old female who sees Burns, Claudina Lick, MD for primary care. I spoke with  [name] by phone today.  What matters to the patients health and wellness today? Patient wants to care for the needs of her spouse as she is able and needs to manage her own medical needs as well    Goals Addressed               This Visit's Progress     Previous Goal: patient wants to care for the needs of her spouse as she is able and needs to manage her own medical needs as well (pt-stated)        Care Coordination Interventions:  LCSW called client today via phone and spoke with Lashante about her care needs at this time. Discussed plan to admit her spouse to [facility] facility this week in [place]. Glorian said that her son is traveling to their home tomorrow and son will help in transition of her spouse to [place] facility Provided counseling support for client Encouraged client that she has tried to care for spouse's needs at home for some time and now may need more help such as facility care for her spouse.   LCSW spoke with Maralee about self care and allowing time for herself to rest and relax.  Akiyah was appreciative of call with LCSW today     SDOH assessments and interventions completed:  Yes  SDOH Interventions Today    Flowsheet Row Most Recent Value  SDOH Interventions   Depression Interventions/Treatment  Counseling  Stress Interventions Provide Counseling  [client has stress related to managing care needs of her spouse]       Care Coordination Interventions:  Yes, provided   Interventions Today    Flowsheet Row Most Recent Value  Chronic Disease   Chronic disease during today's visit Other  [anxiety issues,  stress issues]  General Interventions   General Interventions Discussed/Reviewed General Interventions Discussed   [discussed plans to admit spouse to [facility] in [place], [place] this week]  Exercise Interventions   Exercise Discussed/Reviewed Physical Activity  [she likes walking her dogs for exercise]  Education Interventions   Education Provided Provided Education  Provided Verbal Education On [place] Discussed/Reviewed Anxiety  [discussed self care for client]       Follow up plan: Follow up call scheduled for 03/06/23 at 9:30 AM    Encounter Outcome:  Pt. Visit Completed   Norva Riffle.Jannah Guardiola MSW, [place] Holiday representative [facility] Care Management [phone]

## 2023-02-21 ENCOUNTER — Ambulatory Visit (INDEPENDENT_AMBULATORY_CARE_PROVIDER_SITE_OTHER): Payer: Medicare Other | Admitting: Internal Medicine

## 2023-02-21 ENCOUNTER — Encounter: Payer: Self-pay | Admitting: Internal Medicine

## 2023-02-21 ENCOUNTER — Ambulatory Visit: Payer: Self-pay | Admitting: Licensed Clinical Social Worker

## 2023-02-21 ENCOUNTER — Other Ambulatory Visit: Payer: Self-pay | Admitting: Internal Medicine

## 2023-02-21 VITALS — BP 110/70 | HR 59 | Temp 98.0°F | Ht 63.0 in | Wt 120.0 lb

## 2023-02-21 DIAGNOSIS — R7303 Prediabetes: Secondary | ICD-10-CM | POA: Diagnosis not present

## 2023-02-21 DIAGNOSIS — M961 Postlaminectomy syndrome, not elsewhere classified: Secondary | ICD-10-CM | POA: Diagnosis not present

## 2023-02-21 DIAGNOSIS — I700 Atherosclerosis of aorta: Secondary | ICD-10-CM

## 2023-02-21 DIAGNOSIS — I10 Essential (primary) hypertension: Secondary | ICD-10-CM | POA: Diagnosis not present

## 2023-02-21 DIAGNOSIS — E782 Mixed hyperlipidemia: Secondary | ICD-10-CM | POA: Diagnosis not present

## 2023-02-21 DIAGNOSIS — L0591 Pilonidal cyst without abscess: Secondary | ICD-10-CM

## 2023-02-21 DIAGNOSIS — G8929 Other chronic pain: Secondary | ICD-10-CM

## 2023-02-21 DIAGNOSIS — G4486 Cervicogenic headache: Secondary | ICD-10-CM

## 2023-02-21 DIAGNOSIS — M810 Age-related osteoporosis without current pathological fracture: Secondary | ICD-10-CM

## 2023-02-21 DIAGNOSIS — K219 Gastro-esophageal reflux disease without esophagitis: Secondary | ICD-10-CM | POA: Diagnosis not present

## 2023-02-21 LAB — LIPID PANEL
Cholesterol: 174 mg/dL (ref 0–200)
HDL: 78.1 mg/dL (ref >39.00)
LDL Cholesterol: 79 mg/dL (ref 0–99)
NonHDL: 96.04
Total CHOL/HDL Ratio: 2
Triglycerides: 86 mg/dL (ref 0.0–149.0)
VLDL: 17.2 mg/dL (ref 0.0–40.0)

## 2023-02-21 LAB — CBC WITH DIFFERENTIAL/PLATELET
Basophils Absolute: 0 10*3/uL (ref 0.0–0.1)
Basophils Relative: 0.3 % (ref 0.0–3.0)
Eosinophils Absolute: 0.1 10*3/uL (ref 0.0–0.7)
Eosinophils Relative: 1.5 % (ref 0.0–5.0)
HCT: 40.6 % (ref 36.0–46.0)
Hemoglobin: 13.5 g/dL (ref 12.0–15.0)
Lymphocytes Relative: 37.3 % (ref 12.0–46.0)
Lymphs Abs: 1.7 10*3/uL (ref 0.7–4.0)
MCHC: 33.2 g/dL (ref 30.0–36.0)
MCV: 91.9 fl (ref 78.0–100.0)
Monocytes Absolute: 0.4 10*3/uL (ref 0.1–1.0)
Monocytes Relative: 8.9 % (ref 3.0–12.0)
Neutro Abs: 2.3 10*3/uL (ref 1.4–7.7)
Neutrophils Relative %: 52 % (ref 43.0–77.0)
Platelets: 219 10*3/uL (ref 150.0–400.0)
RBC: 4.41 Mil/uL (ref 3.87–5.11)
RDW: 13.7 % (ref 11.5–15.5)
WBC: 4.5 10*3/uL (ref 4.0–10.5)

## 2023-02-21 LAB — COMPREHENSIVE METABOLIC PANEL
ALT: 33 U/L (ref 0–35)
AST: 32 U/L (ref 0–37)
Albumin: 4.3 g/dL (ref 3.5–5.2)
Alkaline Phosphatase: 54 U/L (ref 39–117)
BUN: 11 mg/dL (ref 6–23)
CO2: 27 mEq/L (ref 19–32)
Calcium: 9.7 mg/dL (ref 8.4–10.5)
Chloride: 105 mEq/L (ref 96–112)
Creatinine, Ser: 0.65 mg/dL (ref 0.40–1.20)
GFR: 87.74 mL/min (ref >60.00)
Glucose, Bld: 89 mg/dL (ref 70–99)
Potassium: 3.7 mEq/L (ref 3.5–5.1)
Sodium: 138 mEq/L (ref 135–145)
Total Bilirubin: 0.5 mg/dL (ref 0.2–1.2)
Total Protein: 7.1 g/dL (ref 6.0–8.3)

## 2023-02-21 LAB — HEMOGLOBIN A1C: Hgb A1c MFr Bld: 5.4 % (ref 4.6–6.5)

## 2023-02-21 MED ORDER — PREGABALIN 150 MG PO CAPS: 150.0000 mg | ORAL_CAPSULE | Freq: Two times a day (BID) | ORAL | 1 refills | Status: DC

## 2023-02-21 MED ORDER — OXYCODONE-ACETAMINOPHEN 7.5-325 MG PO TABS: 1.0000 | ORAL_TABLET | Freq: Three times a day (TID) | ORAL | 0 refills | Status: DC | PRN

## 2023-02-21 NOTE — Assessment & Plan Note (Signed)
Chronic Pain controlled Continue Percocet 7.5-325 mg 1 tab every 8 hours as needed Continue Lyrica 150 mg twice daily Continue Topamax 100 mg twice daily Continue Flexeril 5-10 mg bedtime as needed Continue ice, heat, lidocaine patches

## 2023-02-21 NOTE — Progress Notes (Signed)
Subjective:    Patient ID: Chloe Young, female    DOB: 11-14-1951, 72 y.o.   MRN: [id number]     HPI Chloe Young is here for follow up of her chronic medical problems, including htn, hld, OP, prediabetes, GERD,cervicogenic HA's  She did place her husband in a memory care unit.  She is trying to adjust to this.     She is exercising regularly.   She is walking.    Slightly painful lump left upper buttock midline.  Has been there a couple of months - no getting larger that she can tell.    Medications and allergies reviewed with patient and updated if appropriate.  Current Outpatient Medications on File Prior to Visit  Medication Sig Dispense Refill   acetaminophen (TYLENOL) 500 MG tablet Take 500-1,000 mg by mouth every 6 (six) hours as needed (for pain.).     alendronate (FOSAMAX) 70 MG tablet TAKE 1 TABLET BY MOUTH WEEKLY  WITH 8 OZ OF PLAIN WATER 30  MINUTES BEFORE FIRST FOOD, DRINK OR MEDS. STAY UPRIGHT FOR 30  MINS 12 tablet 3   amoxicillin (AMOXIL) 500 MG capsule Take 2,000 mg by mouth See admin instructions. Take 4 capsules (2000 mg) by mouth 1 hour prior to dental appointment     Calcium Carbonate-Vitamin D (CALCIUM + D PO) Take 1 tablet by mouth daily.     carboxymethylcellul-glycerin (LUBRICANT DROPS/DUAL-ACTION) 0.5-0.9 % ophthalmic solution Place 1 drop into both eyes 3 (three) times daily as needed for dry eyes.     cyclobenzaprine (FLEXERIL) 5 MG tablet Take 1-2 tablets (5-10 mg total) by mouth at bedtime as needed for muscle spasms. 30 tablet 2   fexofenadine (ALLEGRA) 180 MG tablet Take 180 mg by mouth in the morning.     fluticasone (FLONASE) 50 MCG/ACT nasal spray USE 1 SPRAY IN BOTH  NOSTRILS TWICE DAILY (Patient taking differently: Place 1 spray into both nostrils 2 (two) times daily as needed for allergies.) 48 g 1   Lidocaine 4 % PTCH Place 1 patch onto the skin daily as needed (pain (neck)).     methocarbamol (ROBAXIN) 500 MG tablet Take 1 tablet by mouth 4  (four) times daily as needed.     metoprolol tartrate (LOPRESSOR) 25 MG tablet Take 1/2 tablet (12.5 mg total) by mouth 2 (two) times daily. 180 tablet 3   Multiple Vitamin (MULITIVITAMIN WITH MINERALS) TABS Take 1 tablet by mouth in the morning.     rosuvastatin (CRESTOR) 10 MG tablet TAKE 1 TABLET BY MOUTH  DAILY 90 tablet 3   triamcinolone (NASACORT) 55 MCG/ACT AERO nasal inhaler Place 2 sprays into the nose daily as needed (allergies).     No current facility-administered medications on file prior to visit.     Review of Systems  Constitutional:  Negative for fever.  Respiratory:  Negative for cough, shortness of breath and wheezing.   Cardiovascular:  Negative for chest pain, palpitations and leg swelling.  Musculoskeletal:  Positive for back pain.  Neurological:  Negative for light-headedness and headaches.       Objective:   Vitals:   02/21/23 1258  BP: 110/70  Pulse: (!) 59  Temp: 98 F (36.7 C)  SpO2: 99%   BP Readings from Last 3 Encounters:  02/21/23 110/70  10/17/22 128/78  08/21/22 112/80   Wt Readings from Last 3 Encounters:  02/21/23 120 lb (54.4 kg)  10/17/22 121 lb 9.6 oz (55.2 kg)  08/21/22 120 lb 2  oz (54.5 kg)   Body mass index is 21.26 kg/m.    Physical Exam Constitutional:      General: She is not in acute distress.    Appearance: Normal appearance.  HENT:     Head: Normocephalic and atraumatic.  Eyes:     Conjunctiva/sclera: Conjunctivae normal.  Cardiovascular:     Rate and Rhythm: Normal rate and regular rhythm.     Heart sounds: Normal heart sounds.  Pulmonary:     Effort: Pulmonary effort is normal. No respiratory distress.     Breath sounds: Normal breath sounds. No wheezing.  Musculoskeletal:     Cervical back: Neck supple.     Right lower leg: No edema.     Left lower leg: No edema.  Lymphadenopathy:     Cervical: No cervical adenopathy.  Skin:    General: Skin is warm and dry.     Findings: Lesion (Palpable lump left  buttock, medial, top aspect-area is tender-no area edema, no fluctuance) present. No rash.  Neurological:     Mental Status: She is alert. Mental status is at baseline.  Psychiatric:        Mood and Affect: Mood normal.        Behavior: Behavior normal.        Lab Results  Component Value Date   WBC 4.5 02/21/2023   HGB 13.5 02/21/2023   HCT 40.6 02/21/2023   PLT 219.0 02/21/2023   GLUCOSE 89 02/21/2023   CHOL 174 02/21/2023   TRIG 86.0 02/21/2023   HDL 78.10 02/21/2023   LDLDIRECT 156.9 11/25/2013   LDLCALC 79 02/21/2023   ALT 33 02/21/2023   AST 32 02/21/2023   NA 138 02/21/2023   K 3.7 02/21/2023   CL 105 02/21/2023   CREATININE 0.65 02/21/2023   BUN 11 02/21/2023   CO2 27 02/21/2023   TSH 1.18 06/01/2021   INR 1.3 (H) 11/08/2020   HGBA1C 5.4 02/21/2023     Assessment & Plan:    See Problem List for Assessment and Plan of chronic medical problems.

## 2023-02-21 NOTE — Assessment & Plan Note (Signed)
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

## 2023-02-21 NOTE — Patient Outreach (Signed)
  Care Coordination   02/21/2023 Name: Chloe Young MRN: [id number] DOB: May 05, 1951   Care Coordination Outreach Attempts:  An unsuccessful telephone outreach was attempted today to offer the patient information about available care coordination services as a benefit of their health plan.   Follow Up Plan:  Additional outreach attempts will be made to offer the patient care coordination information and services.   Encounter Outcome:  No Answer   Care Coordination Interventions:  No, not indicated    Norva Riffle.Brenson Hartman MSW, [place] Holiday representative [facility] Care Management [phone]

## 2023-02-21 NOTE — Assessment & Plan Note (Signed)
Chronic Regular exercise and healthy diet encouraged Check lipid panel  Continue Crestor 10 mg daily 

## 2023-02-21 NOTE — Assessment & Plan Note (Signed)
Chronic Continue rosuvastatin 10 mg daily Regular exercise, healthy diet encouraged

## 2023-02-21 NOTE — Patient Instructions (Addendum)
     Schedule your bone density scan at [facility].      Blood work was ordered.   The lab is on the first floor.    Medications changes include :  none    Referral ordered for surgery.      Return in about 6 months (around 08/22/2023) for Physical Exam.

## 2023-02-21 NOTE — Assessment & Plan Note (Signed)
Chronic Check a1c Low sugar / carb diet Stressed regular exercise  

## 2023-02-21 NOTE — Assessment & Plan Note (Signed)
Chronic DEXA due-advised for her to schedule this at [facility] already ordered Continue Fosamax 70 mg weekly-started 09/2019-plan for 5 years Continue calcium and vitamin D supplementation Check vitamin D level, CMP

## 2023-02-21 NOTE — Assessment & Plan Note (Signed)
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

## 2023-02-21 NOTE — Assessment & Plan Note (Signed)
Chronic Blood pressure well controlled CMP Continue metoprolol 12.5 mg twice daily

## 2023-02-21 NOTE — Assessment & Plan Note (Signed)
Chronic GERD controlled Continue omeprazole 20 mg daily  

## 2023-03-06 ENCOUNTER — Ambulatory Visit: Payer: Self-pay | Admitting: Licensed Clinical Social Worker

## 2023-03-06 NOTE — Patient Outreach (Signed)
  Care Coordination   Follow Up Visit Note   03/06/2023 Name: Chloe Young MRN: [id number] DOB: 02-20-1951  Chloe Young is a 72 y.o. year old female who sees Burns, Claudina Lick, MD for primary care. I spoke with  Chloe Young by phone today.  What matters to the patients health and wellness today?  Patient wants to manage caregiver stress issues.     Goals Addressed               This Visit's Progress     Patient wants to manage caregiver stress issues. (pt-stated)        Care Coordination Interventions:  LCSW spoke via phone with client today about client needs. Client reported that she is sleeping well. LCSW talked with client about medication procurement. LCSW talked with client about client appetite LCSW talked with client about stress issues of client. Client feels that her mood is more stable; she walks regularly for exercise. She is eating meals on schedule Related to needs of her spouse, she said her spouse admitted to ALF in [place], [place] recently. He is adjusting well to facility and Chloe Young feel good about placement for her spouse at ALF. Reviewed pain issues of client Encouraged Chloe Young to call SW as needed for SW support Client appreciative of call from LCSW.     SDOH assessments and interventions completed:  Yes  SDOH Interventions Today    Flowsheet Row Most Recent Value  SDOH Interventions   Depression Interventions/Treatment  Counseling  Stress Interventions Provide Counseling  [client may have some stress in managing medical needs]       Care Coordination Interventions:  Yes, provided    Interventions Today    Flowsheet Row Most Recent Value  Chronic Disease   Chronic disease during today's visit Other  [spoke with client about care needs of her spouse]  General Interventions   General Interventions Discussed/Reviewed General Interventions Discussed, [employer]  [discussed program support]  Exercise Interventions   Exercise  Discussed/Reviewed Exercise Discussed  [client walks regularly for exercise]  Education Interventions   Education Provided Provided Education  Provided Orthoptist On [place] Discussed/Reviewed Coping Strategies  [discussed relaxation technique]       Follow up plan: Follow up call scheduled for 04/10/23 at 1:00 PM     Encounter Outcome:  Pt. Visit Completed   Norva Riffle.Chloe Young MSW, [place] Holiday representative [facility] Care Management [phone]

## 2023-03-06 NOTE — Patient Instructions (Signed)
Visit Information  Thank you for taking time to visit with me today. Please don't hesitate to contact me if I can be of assistance to you before our next scheduled telephone appointment.  Following are the goals we discussed today:    Our next appointment is by telephone on 04/10/23 at 1:00 PM   Please call the care guide team at [phone] if you need to cancel or reschedule your appointment.   If you are experiencing a Mental Health or [place] or need someone to talk to, please go to [facility] [place] [phone])   Following is a copy of your full plan of care:   Care Coordination Interventions:  LCSW spoke via phone with client today about client needs. Client reported that she is sleeping well. LCSW talked with client about medication procurement. LCSW talked with client about client appetite LCSW talked with client about stress issues of client. Client feels that her mood is more stable; she walks regularly for exercise. She is eating meals on schedule Related to needs of her spouse, she said her spouse admitted to ALF in [place], [place] recently. He is adjusting well to facility and Shannen feel good about placement for her spouse at ALF. Reviewed pain issues of client Encouraged Cheridan to call SW as needed for SW support Client appreciative of call from LCSW.  Ms. Wearing was given information about Care Management services by the embedded care coordination team including:  Care Management services include personalized support from designated clinical staff supervised by her physician, including individualized plan of care and coordination with other care providers 24/7 contact phone numbers for assistance for urgent and routine care needs. The patient may stop CCM services at any time (effective at the end of the month) by phone call to the office staff.  Patient agreed to services and verbal consent  obtained.   Norva Riffle.Kedarius Aloisi MSW, [place] Holiday representative [facility] Care Management [phone]

## 2023-03-29 ENCOUNTER — Other Ambulatory Visit: Payer: Self-pay | Admitting: Internal Medicine

## 2023-03-29 DIAGNOSIS — M961 Postlaminectomy syndrome, not elsewhere classified: Secondary | ICD-10-CM

## 2023-04-10 ENCOUNTER — Ambulatory Visit: Payer: Self-pay | Admitting: Licensed Clinical Social Worker

## 2023-04-10 NOTE — Patient Instructions (Signed)
Visit Information  Thank you for taking time to visit with me today. Please don't hesitate to contact me if I can be of assistance to you.   Following are the goals we discussed today:   Goals Addressed             This Visit's Progress    Patient Stated  she sometimes gets anxious being alone, she is concerned about care needs of her spouse who is in ALF in [place], [place]       Interventions: LCSW spoke via phone with client about client needs LCSW talked with [name] about stress and anxiety management. She said she sometimes gets anxious related to being alone.  LCSW talked with her about socializing with family and friends. She likes going out to eat with friends and relatives. She and LCSW talked of her relaxing by walking her 2 dogs. She likes to walk dogs for exercise Reviewed medication procurement Discussed sleeping challenges. Provided counseling support. Discussed program support with RN, LCSW and Pharmacist Discussed pain issues. Discussed support with PCP Dr. Eileen Stanford Client said her spouse was doing well, adjusting well to ALF facility in [place], [place].  She said she is having a little difficulty to adjusting to spouse being at ALF facility. She visits him about 2 times weekly Reviewed client appointments. Encouraged [name] to call LCSW as needed at [phone]     Our next appointment is by telephone on 05/14/23 at 10:30 AM   Please call the care guide team at [phone] if you need to cancel or reschedule your appointment.   If you are experiencing a Mental Health or Behavioral Health Crisis or need someone to talk to, please go to [facility] [address], [place] [phone])   The patient verbalized understanding of instructions, educational materials, and care plan provided today and DECLINED offer to receive copy of patient instructions, educational materials, and care plan.   The patient has been provided with  contact information for the care management team and has been advised to call with any health related questions or concerns.   Kelton Pillar.Raychel Dowler MSW, LCSW Licensed Visual merchandiser [facility] Care Management [phone]

## 2023-04-10 NOTE — Patient Outreach (Signed)
  Care Coordination   Follow Up Visit Note   04/10/2023 Name: Chloe Young MRN: [id number] DOB: 1951/02/07  Chloe Young is a 72 y.o. year old female who sees Burns, Bobette Mo, MD for primary care. I spoke with  Chloe Young by phone today.  What matters to the patients health and wellness today?  Client said she sometimes gets anxious in being alone. She is concerned over care needs of her spouse who is in ALF in [place], [place]    Goals Addressed             This Visit's Progress    Patient Stated  she sometimes gets anxious being alone, she is concerned about care needs of her spouse who is in ALF in [place], [place]       Interventions: LCSW spoke via phone with client about client needs LCSW talked with Chloe Young about stress and anxiety management. She said she sometimes gets anxious related to being alone.  LCSW talked with her about socializing with family and friends. She likes going out to eat with friends and relatives. She and LCSW talked of her relaxing by walking her 2 dogs. She likes to walk dogs for exercise Reviewed medication procurement Discussed sleeping challenges. Provided counseling support. Discussed program support with RN, LCSW and Pharmacist Discussed pain issues. Discussed support with PCP Dr. Eileen Young Client said her spouse was doing well, adjusting well to ALF facility in [place], [place].  She said she is having a little difficulty to adjusting to spouse being at ALF facility. She visits him about 2 times weekly Reviewed client appointments. Encouraged Chloe Young to call LCSW as needed at [phone]     SDOH assessments and interventions completed:  Yes  SDOH Interventions Today    Flowsheet Row Most Recent Value  SDOH Interventions   Depression Interventions/Treatment  Counseling  Stress Interventions Provide Counseling  [has some stress related to care needs of her spouse who resides in ALF]  Social Connections Interventions Other (Comment)  [client  is sometimes socially isolated occasionally]        Care Coordination Interventions:  Yes, provided   Interventions Today    Flowsheet Row Most Recent Value  Chronic Disease   Chronic disease during today's visit Other  [spoke with client about client needs]  General Interventions   General Interventions Discussed/Reviewed General Interventions Discussed, [employer] program support. discussed support with RN, LCSW and Pharmacist]  Exercise Interventions   Exercise Discussed/Reviewed Physical Activity  [exercises by walking her dogs]  Physical Activity Discussed/Reviewed Physical Activity Reviewed  Education Interventions   Education Provided Provided Education  Provided Verbal Education On Community Resources  Mental Health Interventions   Mental Health Discussed/Reviewed Anxiety, Coping Strategies  [discussed coping skills, discussed anxiety management]  Nutrition Interventions   Nutrition Discussed/Reviewed Nutrition Discussed        Follow up plan: Follow up call scheduled for 05/14/23 at 10:30 AM    Encounter Outcome:  Pt. Visit Completed   Chloe Pillar.Hanif Young MSW, LCSW Licensed Visual merchandiser [facility] Care Management [phone]

## 2023-05-02 ENCOUNTER — Other Ambulatory Visit (HOSPITAL_COMMUNITY): Payer: Self-pay

## 2023-05-04 DIAGNOSIS — M25562 Pain in left knee: Secondary | ICD-10-CM | POA: Diagnosis not present

## 2023-05-07 ENCOUNTER — Other Ambulatory Visit: Payer: Self-pay | Admitting: Internal Medicine

## 2023-05-07 DIAGNOSIS — M961 Postlaminectomy syndrome, not elsewhere classified: Secondary | ICD-10-CM

## 2023-05-10 ENCOUNTER — Telehealth: Payer: Self-pay | Admitting: *Deleted

## 2023-05-10 NOTE — Progress Notes (Signed)
  Care Coordination Note  05/10/2023 Name: Chloe Young MRN: [id number] DOB: [birth year]  Chloe Young is a 72 y.o. year old female who is a primary care patient of Pincus Sanes, MD and is actively engaged with the care management team. I reached out to [name] by phone today to assist with re-scheduling a follow up visit with the Licensed Clinical Social Worker  Follow up plan: Unsuccessful telephone outreach attempt made. A HIPAA compliant phone message was left for the patient providing contact information and requesting a return call.   [facility]  Care Coordination Care Guide  Direct Dial: [phone]

## 2023-05-14 ENCOUNTER — Encounter: Payer: Medicare Other | Admitting: Licensed Clinical Social Worker

## 2023-05-14 NOTE — Progress Notes (Signed)
  Care Coordination Note  05/14/2023 Name: Chloe Young MRN: [id number] DOB: June 13, 1951  Chloe Young is a 72 y.o. year old female who is a primary care patient of Pincus Sanes, MD and is actively engaged with the care management team. I reached out to [name] by phone today to assist with re-scheduling a follow up visit with the Licensed Clinical Social Worker  Follow up plan: Unsuccessful telephone outreach attempt made. A HIPAA compliant phone message was left for the patient providing contact information and requesting a return call.   [facility]  Care Coordination Care Guide  Direct Dial: [phone]

## 2023-05-18 ENCOUNTER — Telehealth: Payer: Medicare Other | Admitting: Physician Assistant

## 2023-05-18 DIAGNOSIS — U071 COVID-19: Secondary | ICD-10-CM | POA: Diagnosis not present

## 2023-05-18 MED ORDER — NIRMATRELVIR/RITONAVIR (PAXLOVID)TABLET
3.0000 | ORAL_TABLET | Freq: Two times a day (BID) | ORAL | 0 refills | Status: DC
Start: 2023-05-18 — End: 2023-05-18

## 2023-05-18 MED ORDER — NIRMATRELVIR/RITONAVIR (PAXLOVID)TABLET
3.0000 | ORAL_TABLET | Freq: Two times a day (BID) | ORAL | 0 refills | Status: AC
Start: 2023-05-18 — End: 2023-05-23

## 2023-05-18 NOTE — Patient Instructions (Signed)
[name], thank you for joining Margaretann Loveless, PA-C for today's virtual visit.  While this provider is not your primary care provider (PCP), if your PCP is located in our provider database this encounter information will be shared with them immediately following your visit.   A [facility] MyChart account gives you access to today's visit and all your visits, tests, and labs performed at [facility] " click here if you don't have a [facility] MyChart account or go to [email or web address]  Consent: (Patient) [name] provided verbal consent for this virtual visit at the beginning of the encounter.  Current Medications:  Current Outpatient Medications:    acetaminophen (TYLENOL) 500 MG tablet, Take 500-1,000 mg by mouth every 6 (six) hours as needed (for pain.)., Disp: , Rfl:    alendronate (FOSAMAX) 70 MG tablet, TAKE 1 TABLET BY MOUTH WEEKLY  WITH 8 OZ OF PLAIN WATER 30  MINUTES BEFORE FIRST FOOD, DRINK OR MEDS. STAY UPRIGHT FOR 30  MINS, Disp: 12 tablet, Rfl: 3   amoxicillin (AMOXIL) 500 MG capsule, Take 2,000 mg by mouth See admin instructions. Take 4 capsules (2000 mg) by mouth 1 hour prior to dental appointment, Disp: , Rfl:    Calcium Carbonate-Vitamin D (CALCIUM + D PO), Take 1 tablet by mouth daily., Disp: , Rfl:    carboxymethylcellul-glycerin (LUBRICANT DROPS/DUAL-ACTION) 0.5-0.9 % ophthalmic solution, Place 1 drop into both eyes 3 (three) times daily as needed for dry eyes., Disp: , Rfl:    cyclobenzaprine (FLEXERIL) 5 MG tablet, Take 1-2 tablets (5-10 mg total) by mouth at bedtime as needed for muscle spasms., Disp: 30 tablet, Rfl: 2   fexofenadine (ALLEGRA) 180 MG tablet, Take 180 mg by mouth in the morning., Disp: , Rfl:    fluticasone (FLONASE) 50 MCG/ACT nasal spray, USE 1 SPRAY IN BOTH  NOSTRILS TWICE DAILY (Patient taking differently: Place 1 spray into both nostrils 2 (two) times daily as needed for allergies.), Disp: 48 g, Rfl: 1   Lidocaine 4  % PTCH, Place 1 patch onto the skin daily as needed (pain (neck))., Disp: , Rfl:    methocarbamol (ROBAXIN) 500 MG tablet, Take 1 tablet by mouth 4 (four) times daily as needed., Disp: , Rfl:    metoprolol tartrate (LOPRESSOR) 25 MG tablet, Take 1/2 tablet (12.5 mg total) by mouth 2 (two) times daily., Disp: 180 tablet, Rfl: 3   Multiple Vitamin (MULITIVITAMIN WITH MINERALS) TABS, Take 1 tablet by mouth in the morning., Disp: , Rfl:    nirmatrelvir/ritonavir (PAXLOVID) 20 x 150 MG & 10 x 100MG  TABS, Take 3 tablets by mouth 2 (two) times daily for 5 days. (Take nirmatrelvir 150 mg two tablets twice daily for 5 days and ritonavir 100 mg one tablet twice daily for 5 days) Patient GFR is 87, Disp: 30 tablet, Rfl: 0   omeprazole (PRILOSEC) 20 MG capsule, TAKE 1 CAPSULE BY MOUTH DAILY, Disp: 90 capsule, Rfl: 3   oxyCODONE-acetaminophen (PERCOCET) 7.5-325 MG tablet, TAKE ONE TABLET BY MOUTH EVERY 8 HOURS AS NEEDED FOR SEVERE PAIN (CHRONIC NECK PAIN)., Disp: 40 tablet, Rfl: 0   pregabalin (LYRICA) 150 MG capsule, Take 1 capsule (150 mg total) by mouth 2 (two) times daily., Disp: 180 capsule, Rfl: 1   rosuvastatin (CRESTOR) 10 MG tablet, TAKE 1 TABLET BY MOUTH  DAILY, Disp: 90 tablet, Rfl: 3   topiramate (TOPAMAX) 100 MG tablet, TAKE 1 TABLET BY MOUTH TWICE  DAILY, Disp: 180 tablet, Rfl: 3   triamcinolone (NASACORT)  55 MCG/ACT AERO nasal inhaler, Place 2 sprays into the nose daily as needed (allergies)., Disp: , Rfl:    Medications ordered in this encounter:  Meds ordered this encounter  Medications   DISCONTD: nirmatrelvir/ritonavir (PAXLOVID) 20 x 150 MG & 10 x 100MG  TABS    Sig: Take 3 tablets by mouth 2 (two) times daily for 5 days. (Take nirmatrelvir 150 mg two tablets twice daily for 5 days and ritonavir 100 mg one tablet twice daily for 5 days) Patient GFR is 87    Dispense:  30 tablet    Refill:  0    Order Specific Question:   Supervising Provider    Answer:   Loreli Dollar    nirmatrelvir/ritonavir (PAXLOVID) 20 x 150 MG & 10 x 100MG  TABS    Sig: Take 3 tablets by mouth 2 (two) times daily for 5 days. (Take nirmatrelvir 150 mg two tablets twice daily for 5 days and ritonavir 100 mg one tablet twice daily for 5 days) Patient GFR is 87    Dispense:  30 tablet    Refill:  0    Order Specific Question:   Supervising Provider    Answer:   Merrilee Jansky [id number]     *If you need refills on other medications prior to your next appointment, please contact your pharmacy*  Follow-Up: Call back or seek an in-person evaluation if the symptoms worsen or if the condition fails to improve as anticipated.  [facility] [phone]  Care Instructions: Nirmatrelvir; Ritonavir Tablets What is this medication? NIRMATRELVIR; RITONAVIR (NIR ma TREL vir; ri TOE na veer) treats mild to moderate COVID-19. It may help people who are at high risk of developing severe illness. It works by limiting the spread of the virus in your body. This medicine may be used for other purposes; ask your health care provider or pharmacist if you have questions. COMMON BRAND NAME(S): PAXLOVID What should I tell my care team before I take this medication? They need to know if you have any of these conditions: Any allergies Any serious illness Kidney disease Liver disease An unusual or allergic reaction to nirmatrelvir, ritonavir, other medications, foods, dyes, or preservatives Pregnant or trying to get pregnant Breast-feeding How should I use this medication? This product contains 2 different medications that are packaged together. For the standard dose, take 2 pink tablets of nirmatrelvir with 1 white tablet of ritonavir (3 tablets total) by mouth with water twice daily. Talk to your care team if you have kidney disease. You may need a different dose. Swallow the tablets whole. You can take it with or without food. If it upsets your stomach, take it with food. Take all of this  medication unless your care team tells you to stop it early. Keep taking it even if you think you are better. Talk to your care team about the use of this medication in children. While it may be prescribed for children as young as 12 years for selected conditions, precautions do apply. Overdosage: If you think you have taken too much of this medicine contact a poison control center or emergency room at once. NOTE: This medicine is only for you. Do not share this medicine with others. What if I miss a dose? If you miss a dose, take it as soon as you can unless it is more than 8 hours late. If it is more than 8 hours late, skip the missed dose. Take the next dose at  the normal time. Do not take extra or 2 doses at the same time to make up for the missed dose. What may interact with this medication? Do not take this medication with any of the following: Alfuzosin Certain medications for anxiety or sleep, such as midazolam or triazolam Certain medications for cancer, such as apalutamide Certain medications for cholesterol, such as lovastatin or simvastatin Certain medications for irregular heartbeat, such as amiodarone, dronedarone, flecainide, propafenone, quinidine Certain medications for mental health conditions, such as lurasidone or pimozide Certain medications for seizures, such as carbamazepine, phenobarbital, phenytoin, primidone Colchicine Eletriptan Eplerenone Ergot alkaloids, such as dihydroergotamine, ergotamine, methylergonovine Finerenone Flibanserin Ivabradine Lomitapide Lumacaftor; ivacaftor Naloxegol Ranolazine Red Yeast Rice Rifampin Rifapentine Sildenafil Silodosin St. John's wort Tolvaptan Ubrogepant Voclosporin This medication may affect how other medications work, and other medications may affect the way this medication works. Talk with your care team about all of the medications you take. They may suggest changes to your treatment plan to lower the risk of side  effects and to make sure your medications work as intended. This list may not describe all possible interactions. Give your health care provider a list of all the medicines, herbs, non-prescription drugs, or dietary supplements you use. Also tell them if you smoke, drink alcohol, or use illegal drugs. Some items may interact with your medicine. What should I watch for while using this medication? Your condition will be monitored carefully while you are receiving this medication. Visit your care team for regular checkups. Tell your care team if your symptoms do not start to get better or if they get worse. If you have untreated HIV infection, this medication may lead to some HIV medications not working as well in the future. Estrogen and progestin hormones may not work as well while you are taking this medication. Your care team can help you find the contraceptive option that works for you. What side effects may I notice from receiving this medication? Side effects that you should report to your care team as soon as possible: Allergic reactions--skin rash, itching, hives, swelling of the face, lips, tongue, or throat Liver injury--right upper belly pain, loss of appetite, nausea, light-colored stool, dark yellow or [place]ann urine, yellowing skin or eyes, unusual weakness or fatigue Redness, blistering, peeling, or loosening of the skin, including inside the mouth Side effects that usually do not require medical attention (report these to your care team if they continue or are bothersome): Change in taste Diarrhea General discomfort and fatigue Increase in blood pressure Muscle pain Nausea Stomach pain This list may not describe all possible side effects. Call your doctor for medical advice about side effects. You may report side effects to FDA at 1-800-FDA-1088. Where should I keep my medication? Keep out of the reach of children and pets. Store at room temperature between 20 and 25 degrees C (68  and 77 degrees F). Get rid of any unused medication after the expiration date. To get rid of medications that are no longer needed or have expired: Take the medication to a medication take-back program. Check with your pharmacy or law enforcement to find a location. If you cannot return the medication, check the label or package insert to see if the medication should be thrown out in the garbage or flushed down the toilet. If you are not sure, ask your care team. If it is safe to put it in the trash, take the medication out of the container. Mix the medication with cat litter, dirt, coffee  grounds, or other unwanted substance. Seal the mixture in a bag or container. Put it in the trash. NOTE: This sheet is a summary. It may not cover all possible information. If you have questions about this medicine, talk to your doctor, pharmacist, or health care provider.  2024 Elsevier/Gold Standard (2023-02-18 00:00:00)    Isolation Instructions: You are to isolate at home until you have been fever free for at least 24 hours without a fever-reducing medication, and symptoms have been steadily improving for 24 hours. At that time,  you can end isolation but need to mask for an additional 5 days.   If you must be around other household members who do not have symptoms, you need to make sure that both you and the family members are masking consistently with a high-quality mask.  If you note any worsening of symptoms despite treatment, please seek an in-person evaluation ASAP. If you note any significant shortness of breath or any chest pain, please seek ER evaluation. Please do not delay care!   COVID-19: What to Do if You Are Sick If you test positive and are an older adult or someone who is at high risk of getting very sick from COVID-19, treatment may be available. Contact a healthcare provider right away after a positive test to determine if you are eligible, even if your symptoms are mild right now. You can  also visit a Test to Treat location and, if eligible, receive a prescription from a provider. Don't delay: Treatment must be started within the first few days to be effective. If you have a fever, cough, or other symptoms, you might have COVID-19. Most people have mild illness and are able to recover at home. If you are sick: Keep track of your symptoms. If you have an emergency warning sign (including trouble breathing), call 911. Steps to help prevent the spread of COVID-19 if you are sick If you are sick with COVID-19 or think you might have COVID-19, follow the steps below to care for yourself and to help protect other people in your home and community. Stay home except to get medical care Stay home. Most people with COVID-19 have mild illness and can recover at home without medical care. Do not leave your home, except to get medical care. Do not visit public areas and do not go to places where you are unable to wear a mask. Take care of yourself. Get rest and stay hydrated. Take over-the-counter medicines, such as acetaminophen, to help you feel better. Stay in touch with your doctor. Call before you get medical care. Be sure to get care if you have trouble breathing, or have any other emergency warning signs, or if you think it is an emergency. Avoid public transportation, ride-sharing, or taxis if possible. Get tested If you have symptoms of COVID-19, get tested. While waiting for test results, stay away from others, including staying apart from those living in your household. Get tested as soon as possible after your symptoms start. Treatments may be available for people with COVID-19 who are at risk for becoming very sick. Don't delay: Treatment must be started early to be effective--some treatments must begin within 5 days of your first symptoms. Contact your healthcare provider right away if your test result is positive to determine if you are eligible. Self-tests are one of several options  for testing for the virus that causes COVID-19 and may be more convenient than laboratory-based tests and point-of-care tests. Ask your healthcare provider or your  local health department if you need help interpreting your test results. You can visit your state, tribal, local, and territorial health department's website to look for the latest local information on testing sites. Separate yourself from other people As much as possible, stay in a specific room and away from other people and pets in your home. If possible, you should use a separate bathroom. If you need to be around other people or animals in or outside of the home, wear a well-fitting mask. Tell your close contacts that they may have been exposed to COVID-19. An infected person can spread COVID-19 starting 48 hours (or 2 days) before the person has any symptoms or tests positive. By letting your close contacts know they may have been exposed to COVID-19, you are helping to protect everyone. See COVID-19 and Animals if you have questions about pets. If you are diagnosed with COVID-19, someone from the health department may call you. Answer the call to slow the spread. Monitor your symptoms Symptoms of COVID-19 include fever, cough, or other symptoms. Follow care instructions from your healthcare provider and local health department. Your local health authorities may give instructions on checking your symptoms and reporting information. When to seek emergency medical attention Look for emergency warning signs* for COVID-19. If someone is showing any of these signs, seek emergency medical care immediately: Trouble breathing Persistent pain or pressure in the chest New confusion Inability to wake or stay awake Pale, gray, or blue-colored skin, lips, or nail beds, depending on skin tone *This list is not all possible symptoms. Please call your medical provider for any other symptoms that are severe or concerning to you. Call 911 or call  ahead to your local emergency facility: Notify the operator that you are seeking care for someone who has or may have COVID-19. Call ahead before visiting your doctor Call ahead. Many medical visits for routine care are being postponed or done by phone or telemedicine. If you have a medical appointment that cannot be postponed, call your doctor's office, and tell them you have or may have COVID-19. This will help the office protect themselves and other patients. If you are sick, wear a well-fitting mask You should wear a mask if you must be around other people or animals, including pets (even at home). Wear a mask with the best fit, protection, and comfort for you. You don't need to wear the mask if you are alone. If you can't put on a mask (because of trouble breathing, for example), cover your coughs and sneezes in some other way. Try to stay at least 6 feet away from other people. This will help protect the people around you. Masks should not be placed on young children under age 87 years, anyone who has trouble breathing, or anyone who is not able to remove the mask without help. Cover your coughs and sneezes Cover your mouth and nose with a tissue when you cough or sneeze. Throw away used tissues in a lined trash can. Immediately wash your hands with soap and water for at least 20 seconds. If soap and water are not available, clean your hands with an alcohol-based hand sanitizer that contains at least 60% alcohol. Clean your hands often Wash your hands often with soap and water for at least 20 seconds. This is especially important after blowing your nose, coughing, or sneezing; going to the bathroom; and before eating or preparing food. Use hand sanitizer if soap and water are not available. Use an alcohol-based hand  sanitizer with at least 60% alcohol, covering all surfaces of your hands and rubbing them together until they feel dry. Soap and water are the best option, especially if hands are  visibly dirty. Avoid touching your eyes, nose, and mouth with unwashed hands. Handwashing Tips Avoid sharing personal household items Do not share dishes, drinking glasses, cups, eating utensils, towels, or bedding with other people in your home. Wash these items thoroughly after using them with soap and water or put in the dishwasher. Clean surfaces in your home regularly Clean and disinfect high-touch surfaces (for example, doorknobs, tables, handles, light switches, and countertops) in your "sick room" and bathroom. In shared spaces, you should clean and disinfect surfaces and items after each use by the person who is ill. If you are sick and cannot clean, a caregiver or other person should only clean and disinfect the area around you (such as your bedroom and bathroom) on an as needed basis. Your caregiver/other person should wait as long as possible (at least several hours) and wear a mask before entering, cleaning, and disinfecting shared spaces that you use. Clean and disinfect areas that may have blood, stool, or body fluids on them. Use household cleaners and disinfectants. Clean visible dirty surfaces with household cleaners containing soap or detergent. Then, use a household disinfectant. Use a product from [employer] List N: Disinfectants for Coronavirus (COVID-19). Be sure to follow the instructions on the label to ensure safe and effective use of the product. Many products recommend keeping the surface wet with a disinfectant for a certain period of time (look at "contact time" on the product label). You may also need to wear personal protective equipment, such as gloves, depending on the directions on the product label. Immediately after disinfecting, wash your hands with soap and water for 20 seconds. For completed guidance on cleaning and disinfecting your home, visit Complete Disinfection Guidance. Take steps to improve ventilation at home Improve ventilation (air flow) at home to help  prevent from spreading COVID-19 to other people in your household. Clear out COVID-19 virus particles in the air by opening windows, using air filters, and turning on fans in your home. Use this interactive tool to learn how to improve air flow in your home. When you can be around others after being sick with COVID-19 Deciding when you can be around others is different for different situations. Find out when you can safely end home isolation. For any additional questions about your care, contact your healthcare provider or state or local health department. 03/15/2021 Content source: [facility] for Immunization and Respiratory Diseases ([facility]), Division of Viral Diseases This information is not intended to replace advice given to you by your health care provider. Make sure you discuss any questions you have with your health care provider. Document Revised: 04/28/2021 Document Reviewed: 04/28/2021 Elsevier Patient Education  2022 [employer].     If you have been instructed to have an in-person evaluation today at a local Urgent Care facility, please use the link below. It will take you to a list of all of our available [facility], including address, phone number and hours of operation. Please do not delay care.  [facility]  If you or a family member do not have a primary care provider, use the link below to schedule a visit and establish care. When you choose a [facility] primary care physician or advanced practice provider, you gain a long-term partner in health. Find a Primary Care  Provider  Learn more about [facility]'s in-office and virtual care options: [facility] - Get Care Now

## 2023-05-18 NOTE — Progress Notes (Signed)
Virtual Visit Consent   Chloe Young, you are scheduled for a virtual visit with a [facility] provider today. Just as with appointments in the office, your consent must be obtained to participate. Your consent will be active for this visit and any virtual visit you may have with one of our providers in the next 365 days. If you have a MyChart account, a copy of this consent can be sent to you electronically.  As this is a virtual visit, video technology does not allow for your provider to perform a traditional examination. This may limit your provider's ability to fully assess your condition. If your provider identifies any concerns that need to be evaluated in person or the need to arrange testing (such as labs, EKG, etc.), we will make arrangements to do so. Although advances in technology are sophisticated, we cannot ensure that it will always work on either your end or our end. If the connection with a video visit is poor, the visit may have to be switched to a telephone visit. With either a video or telephone visit, we are not always able to ensure that we have a secure connection.  By engaging in this virtual visit, you consent to the provision of healthcare and authorize for your insurance to be billed (if applicable) for the services provided during this visit. Depending on your insurance coverage, you may receive a charge related to this service.  I need to obtain your verbal consent now. Are you willing to proceed with your visit today? [name] has provided verbal consent on 05/18/2023 for a virtual visit (video or telephone). Margaretann Loveless, PA-C  Date: 05/18/2023 4:39 PM  Virtual Visit via Video Note   I, Margaretann Loveless, connected with  Chloe Young  ([id number], 04-Dec-1951) on 05/18/23 at  4:30 PM EDT by a video-enabled telemedicine application and verified that I am speaking with the correct person using two identifiers.  Location: Patient: Virtual Visit Location  Patient: Home Provider: Virtual Visit Location Provider: Home Office   I discussed the limitations of evaluation and management by telemedicine and the availability of in person appointments. The patient expressed understanding and agreed to proceed.    History of Present Illness: Chloe Young is a 72 y.o. who identifies as a female who was assigned female at birth, and is being seen today for Covid 25.  HPI: URI  This is a new problem. Episode onset: Tested positive for Covid 19 on at home test this morning; Symptoms started late last night. The problem has been gradually worsening. The maximum temperature recorded prior to her arrival was 102 - 102.9 F (101.9, 102). The fever has been present for Less than 1 day. Associated symptoms include congestion, coughing (mild), headaches, a plugged ear sensation, rhinorrhea (and post nasal drainage), sinus pain, sneezing and a sore throat (scratchy). Pertinent negatives include no diarrhea, ear pain, nausea or vomiting. She has tried acetaminophen for the symptoms. The treatment provided no relief.     Problems:  Patient Active Problem List   Diagnosis Date Noted   Pilonidal cyst 02/21/2023   Right hip pain 12/05/2021   Chronic pain 09/02/2021   Incisional hernia status post component separation & abdominal wall reconstruction with mesh 09/01/2021 09/01/2021   Aortic atherosclerosis (HCC) 08/25/2021   Encounter for chronic pain management 08/25/2021   Pain management contract signed  08/25/21 08/25/2021   ETD (Eustachian tube dysfunction), right 03/30/2021   Parastomal hernia s/p robotic colostomy takedown  03/23/2021 03/24/2021   Diverticulitis of large intestine with perforation and abscess s/p Hartmann/colostomy  11/08/2020 03/23/2021   Chronic nasal congestion 03/10/2020   Lumbar radiculopathy 06/15/2016   Cervicogenic headache 06/15/2016   Osteoporosis 06/15/2016   GERD (gastroesophageal reflux disease) 06/15/2016   Prediabetes 11/25/2013    Nonspecific abnormal electrocardiogram (ECG) (EKG) 05/28/2012   Cervical post-laminectomy syndrome 03/18/2012   Osteoarthritis of both knees 03/18/2012   Raynaud's syndrome 10/11/2010   NECK PAIN, CHRONIC 04/19/2010   Hyperlipidemia 04/19/2009   Essential hypertension 04/19/2009    Allergies: No Known Allergies Medications:  Current Outpatient Medications:    acetaminophen (TYLENOL) 500 MG tablet, Take 500-1,000 mg by mouth every 6 (six) hours as needed (for pain.)., Disp: , Rfl:    alendronate (FOSAMAX) 70 MG tablet, TAKE 1 TABLET BY MOUTH WEEKLY  WITH 8 OZ OF PLAIN WATER 30  MINUTES BEFORE FIRST FOOD, DRINK OR MEDS. STAY UPRIGHT FOR 30  MINS, Disp: 12 tablet, Rfl: 3   amoxicillin (AMOXIL) 500 MG capsule, Take 2,000 mg by mouth See admin instructions. Take 4 capsules (2000 mg) by mouth 1 hour prior to dental appointment, Disp: , Rfl:    Calcium Carbonate-Vitamin D (CALCIUM + D PO), Take 1 tablet by mouth daily., Disp: , Rfl:    carboxymethylcellul-glycerin (LUBRICANT DROPS/DUAL-ACTION) 0.5-0.9 % ophthalmic solution, Place 1 drop into both eyes 3 (three) times daily as needed for dry eyes., Disp: , Rfl:    cyclobenzaprine (FLEXERIL) 5 MG tablet, Take 1-2 tablets (5-10 mg total) by mouth at bedtime as needed for muscle spasms., Disp: 30 tablet, Rfl: 2   fexofenadine (ALLEGRA) 180 MG tablet, Take 180 mg by mouth in the morning., Disp: , Rfl:    fluticasone (FLONASE) 50 MCG/ACT nasal spray, USE 1 SPRAY IN BOTH  NOSTRILS TWICE DAILY (Patient taking differently: Place 1 spray into both nostrils 2 (two) times daily as needed for allergies.), Disp: 48 g, Rfl: 1   Lidocaine 4 % PTCH, Place 1 patch onto the skin daily as needed (pain (neck))., Disp: , Rfl:    methocarbamol (ROBAXIN) 500 MG tablet, Take 1 tablet by mouth 4 (four) times daily as needed., Disp: , Rfl:    metoprolol tartrate (LOPRESSOR) 25 MG tablet, Take 1/2 tablet (12.5 mg total) by mouth 2 (two) times daily., Disp: 180 tablet, Rfl: 3    Multiple Vitamin (MULITIVITAMIN WITH MINERALS) TABS, Take 1 tablet by mouth in the morning., Disp: , Rfl:    nirmatrelvir/ritonavir (PAXLOVID) 20 x 150 MG & 10 x 100MG  TABS, Take 3 tablets by mouth 2 (two) times daily for 5 days. (Take nirmatrelvir 150 mg two tablets twice daily for 5 days and ritonavir 100 mg one tablet twice daily for 5 days) Patient GFR is 87, Disp: 30 tablet, Rfl: 0   omeprazole (PRILOSEC) 20 MG capsule, TAKE 1 CAPSULE BY MOUTH DAILY, Disp: 90 capsule, Rfl: 3   oxyCODONE-acetaminophen (PERCOCET) 7.5-325 MG tablet, TAKE ONE TABLET BY MOUTH EVERY 8 HOURS AS NEEDED FOR SEVERE PAIN (CHRONIC NECK PAIN)., Disp: 40 tablet, Rfl: 0   pregabalin (LYRICA) 150 MG capsule, Take 1 capsule (150 mg total) by mouth 2 (two) times daily., Disp: 180 capsule, Rfl: 1   rosuvastatin (CRESTOR) 10 MG tablet, TAKE 1 TABLET BY MOUTH  DAILY, Disp: 90 tablet, Rfl: 3   topiramate (TOPAMAX) 100 MG tablet, TAKE 1 TABLET BY MOUTH TWICE  DAILY, Disp: 180 tablet, Rfl: 3   triamcinolone (NASACORT) 55 MCG/ACT AERO nasal inhaler, Place 2 sprays into the nose  daily as needed (allergies)., Disp: , Rfl:   Observations/Objective: Patient is well-developed, well-nourished in no acute distress.  Resting comfortably at home.  Head is normocephalic, atraumatic.  No labored breathing.  Speech is clear and coherent with logical content.  Patient is alert and oriented at baseline.    Assessment and Plan: 1. COVID-19 - MyChart COVID-19 home monitoring program; Future - nirmatrelvir/ritonavir (PAXLOVID) 20 x 150 MG & 10 x 100MG  TABS; Take 3 tablets by mouth 2 (two) times daily for 5 days. (Take nirmatrelvir 150 mg two tablets twice daily for 5 days and ritonavir 100 mg one tablet twice daily for 5 days) Patient GFR is 87  Dispense: 30 tablet; Refill: 0  - Continue OTC symptomatic management of choice - Will send OTC vitamins and supplement information through AVS - Paxlovid prescribed - Patient enrolled in MyChart  symptom monitoring - Push fluids - Rest as needed - Discussed return precautions and when to seek in-person evaluation, sent via AVS as well   Follow Up Instructions: I discussed the assessment and treatment plan with the patient. The patient was provided an opportunity to ask questions and all were answered. The patient agreed with the plan and demonstrated an understanding of the instructions.  A copy of instructions were sent to the patient via MyChart unless otherwise noted below.    The patient was advised to call back or seek an in-person evaluation if the symptoms worsen or if the condition fails to improve as anticipated.  Time:  I spent 10 minutes with the patient via telehealth technology discussing the above problems/concerns.    Margaretann Loveless, PA-C

## 2023-05-18 NOTE — Progress Notes (Signed)
  Care Coordination Note  05/18/2023 Name: [name] MRN: [id number] DOB: 08/13/1951  Chloe Young is a 72 y.o. year old female who is a primary care patient of Pincus Sanes, MD and is actively engaged with the care management team. I reached out to [name] by phone today to assist with re-scheduling a follow up visit with the Licensed Clinical Social Worker  Follow up plan: Unsuccessful telephone outreach attempt made. A HIPAA compliant phone message was left for the patient providing contact information and requesting a return call.  We have been unable to make contact with the patient for follow up. The care management team is available to follow up with the patient after provider conversation with the patient regarding recommendation for care management engagement and subsequent re-referral to the care management team.   [facility] Coordination Care Guide  Direct Dial: [phone]

## 2023-05-29 ENCOUNTER — Ambulatory Visit: Payer: Self-pay | Admitting: Licensed Clinical Social Worker

## 2023-05-29 NOTE — Patient Instructions (Signed)
Visit Information  Thank you for taking time to visit with me today. Please don't hesitate to contact me if I can be of assistance to you.   Following are the goals we discussed today:   Goals Addressed               This Visit's Progress     patient recently had COVID-19. She is recovering from COVID-19 (pt-stated)        Interventions:  Spoke with client about client needs Discussed program support for client Discussed transport needs of client Client said she recently had COVID-19 and is recovering from COVID-19 Provided counseling support for client Discussed medication procurement for client Discussed energy level of client. She said she has been more fatigued lately Discussed fact that her spouse receives care at ALF in [place], [place]. She said she is adjusting to her spouse receiving care at ALF facility Discussed upcoming client appointments Discussed client medical care with PCP. Encouraged client to call LCSW as needed for SW support at [phone].        Our next appointment is by telephone on 07/30/23 at 10:30 AM   Please call the care guide team at [phone] if you need to cancel or reschedule your appointment.   If you are experiencing a Mental Health or Behavioral Health Crisis or need someone to talk to, please go to [facility] [address] [place] [phone])   The patient verbalized understanding of instructions, educational materials, and care plan provided today and DECLINED offer to receive copy of patient instructions, educational materials, and care plan.   The patient has been provided with contact information for the care management team and has been advised to call with any health related questions or concerns.   Kelton Pillar.Mykayla Brinton MSW, LCSW Licensed Visual merchandiser [facility] Care Management [phone]

## 2023-05-29 NOTE — Patient Outreach (Signed)
  Care Coordination   Follow Up Visit Note   05/29/2023 Name: Chloe Young MRN: [id number] DOB: 03/23/1951  Chloe Young is a 72 y.o. year old female who sees Burns, Bobette Mo, MD for primary care. I spoke with  [name] by phone today.  What matters to the patients health and wellness today? Patient recently had COVID-19. She is recovering from COVID-19     Goals Addressed               This Visit's Progress     patient recently had COVID-19. She is recovering from COVID-19 (pt-stated)        Interventions:  Spoke with client about client needs Discussed program support for client Discussed transport needs of client Client said she recently had COVID-19 and is recovering from COVID-19 Provided counseling support for client Discussed medication procurement for client Discussed energy level of client. She said she has been more fatigued lately Discussed fact that her spouse receives care at ALF in [place], [place]. She said she is adjusting to her spouse receiving care at ALF facility Discussed upcoming client appointments Discussed client medical care with PCP. Encouraged client to call LCSW as needed for SW support at [phone].        SDOH assessments and interventions completed:  Yes  SDOH Interventions Today    Flowsheet Row Most Recent Value  SDOH Interventions   Depression Interventions/Treatment  Counseling  Stress Interventions Provide Counseling  [stress in managing medical needs.]        Care Coordination Interventions:  Yes, provided   Interventions Today    Flowsheet Row Most Recent Value  Chronic Disease   Chronic disease during today's visit Other  [spoke with client about client needs]  General Interventions   General Interventions Discussed/Reviewed General Interventions Discussed, [employer]  [discussed program support]  Exercise Interventions   Exercise Discussed/Reviewed Physical Activity  [she likes walking as a form of  exercise]  Education Interventions   Education Provided Provided Education  Provided Verbal Education On [employer] program support with RN, LCSW, and Pharmacist]  Mental Health Interventions   Mental Health Discussed/Reviewed Anxiety, Coping Strategies  [discussed stress management techniques. she likes caring for her pets as a form of relaxation . She likes to walk for exercise. She is adjusting to her spouse being in ALF facility]  Nutrition Interventions   Nutrition Discussed/Reviewed Nutrition Discussed  Pharmacy Interventions   Pharmacy Dicussed/Reviewed Pharmacy Topics Discussed        Follow up plan: Follow up call scheduled for 07/30/23 at 10:30 AM    Encounter Outcome:  Pt. Visit Completed   Kelton Pillar.Coleby Yett MSW, LCSW Licensed Visual merchandiser [facility] Care Management [phone]

## 2023-05-31 DIAGNOSIS — L821 Other seborrheic keratosis: Secondary | ICD-10-CM | POA: Diagnosis not present

## 2023-05-31 DIAGNOSIS — L814 Other melanin hyperpigmentation: Secondary | ICD-10-CM | POA: Diagnosis not present

## 2023-05-31 DIAGNOSIS — D045 Carcinoma in situ of skin of trunk: Secondary | ICD-10-CM | POA: Diagnosis not present

## 2023-05-31 DIAGNOSIS — L570 Actinic keratosis: Secondary | ICD-10-CM | POA: Diagnosis not present

## 2023-06-14 ENCOUNTER — Other Ambulatory Visit: Payer: Self-pay | Admitting: Internal Medicine

## 2023-06-14 DIAGNOSIS — M961 Postlaminectomy syndrome, not elsewhere classified: Secondary | ICD-10-CM

## 2023-06-26 DIAGNOSIS — M8588 Other specified disorders of bone density and structure, other site: Secondary | ICD-10-CM | POA: Diagnosis not present

## 2023-06-26 DIAGNOSIS — Z1231 Encounter for screening mammogram for malignant neoplasm of breast: Secondary | ICD-10-CM | POA: Diagnosis not present

## 2023-06-26 DIAGNOSIS — Z8262 Family history of osteoporosis: Secondary | ICD-10-CM | POA: Diagnosis not present

## 2023-06-26 LAB — HM DEXA SCAN

## 2023-06-26 LAB — HM MAMMOGRAPHY

## 2023-07-06 ENCOUNTER — Other Ambulatory Visit: Payer: Self-pay | Admitting: Internal Medicine

## 2023-07-17 ENCOUNTER — Other Ambulatory Visit: Payer: Self-pay | Admitting: Internal Medicine

## 2023-07-17 DIAGNOSIS — M961 Postlaminectomy syndrome, not elsewhere classified: Secondary | ICD-10-CM

## 2023-07-17 NOTE — Telephone Encounter (Signed)
MD out of the office until 7/29 pls advise on refill.Marland KitchenRaechel Chute

## 2023-07-25 ENCOUNTER — Encounter (INDEPENDENT_AMBULATORY_CARE_PROVIDER_SITE_OTHER): Payer: Self-pay

## 2023-07-30 ENCOUNTER — Ambulatory Visit: Payer: Self-pay | Admitting: Licensed Clinical Social Worker

## 2023-07-30 NOTE — Patient Outreach (Signed)
  Care Coordination   Follow Up Visit Note   07/30/2023 Name: HILDRETH MASTEN MRN: [id number] DOB: [birth year]  IRAIMA NEIBAUER is a 72 y.o. year old female who sees Burns, Bobette Mo, MD for primary care. I spoke with  [name] by phone today.  What matters to the patients health and wellness today?  Patient recently had COVID-19 . She is recovering from COVID-19.     Goals Addressed               This Visit's Progress     patient recently had COVID-19. She is recovering from COVID-19 (pt-stated)        Interventions:  Spoke with [name], via phone today about her needs Discussed program support for client with RN, Pharmacist, LCSW Discussed transport needs of client. She does not have any transport needs at present Client said she recently had COVID-19 and is recovering from COVID-19. She feels that she is doing better. Her energy level has improved Provided counseling support for client Discussed medication procurement for client Discussed status of her spouse who resides at ALF facility. She visits her spouse regularly at ALF. She said it is sometimes hard to leave spouse when she leaves him to return to her home.  Distance from her home to ALF is about 25 miles Discussed mood of client. She enjoys relaxing by walking her dogs; she enjoys socializing with her friends to help her relax Reviewed pain issues.  Encouraged client to call LCSW as needed for SW support at [phone].          SDOH assessments and interventions completed:  Yes  SDOH Interventions Today    Flowsheet Row Most Recent Value  SDOH Interventions   Depression Interventions/Treatment  Counseling  Stress Interventions Provide Counseling  [has stress related to managing medical needs]        Care Coordination Interventions:  Yes, provided    Interventions Today    Flowsheet Row Most Recent Value  Chronic Disease   Chronic disease during today's visit Other  [spoke with client about  client needs]  General Interventions   General Interventions Discussed/Reviewed General Interventions Discussed, Community Resources  [discussed program support]  Exercise Interventions   Exercise Discussed/Reviewed Physical Activity  [likes to walk her dogs for exercise]  Physical Activity Discussed/Reviewed Physical Activity Discussed  Education Interventions   Education Provided Provided Education  Provided Verbal Education On Community Resources  Mental Health Interventions   Mental Health Discussed/Reviewed Coping Strategies, Anxiety  [she likes to socialize with her friends to hel pher relax. she likes walking as exercise. She is taking medications as prescribed]  Nutrition Interventions   Nutrition Discussed/Reviewed Nutrition Discussed  Pharmacy Interventions   Pharmacy Dicussed/Reviewed Pharmacy Topics Discussed       Follow up plan: Follow up call scheduled for 09/18/23 at 2:00 PM     Encounter Outcome:  Pt. Visit Completed   Kelton Pillar.Ameet Sandy MSW, LCSW Licensed Visual merchandiser [facility] Care Management [phone]

## 2023-07-30 NOTE — Patient Instructions (Signed)
Visit Information  Thank you for taking time to visit with me today. Please don't hesitate to contact me if I can be of assistance to you.   Following are the goals we discussed today:   Goals Addressed               This Visit's Progress     patient recently had COVID-19. She is recovering from COVID-19 (pt-stated)        Interventions:  Spoke with [name], via phone today about her needs Discussed program support for client with RN, Pharmacist, LCSW Discussed transport needs of client. She does not have any transport needs at present Client said she recently had COVID-19 and is recovering from COVID-19. She feels that she is doing better. Her energy level has improved Provided counseling support for client Discussed medication procurement for client Discussed status of her spouse who resides at ALF facility. She visits her spouse regularly at ALF. She said it is sometimes hard to leave spouse when she leaves him to return to her home.  Distance from her home to ALF is about 25 miles Discussed mood of client. She enjoys relaxing by walking her dogs; she enjoys socializing with her friends to help her relax Reviewed pain issues.  Encouraged client to call LCSW as needed for SW support at [phone].          Our next appointment is by telephone on 09/18/23 at 2:00 PM   Please call the care guide team at [phone] if you need to cancel or reschedule your appointment.   If you are experiencing a Mental Health or Behavioral Health Crisis or need someone to talk to, please go to [facility] [address], [place] [phone])   The patient verbalized understanding of instructions, educational materials, and care plan provided today and DECLINED offer to receive copy of patient instructions, educational materials, and care plan.   The patient has been provided with contact information for the care management team and has been  advised to call with any health related questions or concerns.   Kelton Pillar.Jeyli Zwicker MSW, LCSW Licensed Visual merchandiser [facility] Care Management [phone]

## 2023-08-23 NOTE — Patient Instructions (Addendum)
Blood work was ordered.   The lab is on the first floor.    Medications changes include :   for your bones we will consider evenity x 1 year - monthly injections for one year - then we would transition to prolia - injections every 6 months in the office.  We will let you know what the cost would be     Return in about 6 months (around 02/22/2024) for follow up.   Health Maintenance, Female Adopting a healthy lifestyle and getting preventive care are important in promoting health and wellness. Ask your health care provider about: The right schedule for you to have regular tests and exams. Things you can do on your own to prevent diseases and keep yourself healthy. What should I know about diet, weight, and exercise? Eat a healthy diet  Eat a diet that includes plenty of vegetables, fruits, low-fat dairy products, and lean protein. Do not eat a lot of foods that are high in solid fats, added sugars, or sodium. Maintain a healthy weight Body mass index (BMI) is used to identify weight problems. It estimates body fat based on height and weight. Your health care provider can help determine your BMI and help you achieve or maintain a healthy weight. Get regular exercise Get regular exercise. This is one of the most important things you can do for your health. Most adults should: Exercise for at least 150 minutes each week. The exercise should increase your heart rate and make you sweat (moderate-intensity exercise). Do strengthening exercises at least twice a week. This is in addition to the moderate-intensity exercise. Spend less time sitting. Even light physical activity can be beneficial. Watch cholesterol and blood lipids Have your blood tested for lipids and cholesterol at 73 years of age, then have this test every 5 years. Have your cholesterol levels checked more often if: Your lipid or cholesterol levels are high. You are older than 73 years of age. You are at high risk  for heart disease. What should I know about cancer screening? Depending on your health history and family history, you may need to have cancer screening at various ages. This may include screening for: Breast cancer. Cervical cancer. Colorectal cancer. Skin cancer. Lung cancer. What should I know about heart disease, diabetes, and high blood pressure? Blood pressure and heart disease High blood pressure causes heart disease and increases the risk of stroke. This is more likely to develop in people who have high blood pressure readings or are overweight. Have your blood pressure checked: Every 3-5 years if you are 65-86 years of age. Every year if you are 37 years old or older. Diabetes Have regular diabetes screenings. This checks your fasting blood sugar level. Have the screening done: Once every three years after age 33 if you are at a normal weight and have a low risk for diabetes. More often and at a younger age if you are overweight or have a high risk for diabetes. What should I know about preventing infection? Hepatitis B If you have a higher risk for hepatitis B, you should be screened for this virus. Talk with your health care provider to find out if you are at risk for hepatitis B infection. Hepatitis C Testing is recommended for: Everyone born from [birth year] through 1965. Anyone with known risk factors for hepatitis C. Sexually transmitted infections (STIs) Get screened for STIs, including gonorrhea and chlamydia, if: You are sexually active and are younger than 73 years  of age. You are older than 73 years of age and your health care provider tells you that you are at risk for this type of infection. Your sexual activity has changed since you were last screened, and you are at increased risk for chlamydia or gonorrhea. Ask your health care provider if you are at risk. Ask your health care provider about whether you are at high risk for HIV. Your health care provider may recommend  a prescription medicine to help prevent HIV infection. If you choose to take medicine to prevent HIV, you should first get tested for HIV. You should then be tested every 3 months for as long as you are taking the medicine. Pregnancy If you are about to stop having your period (premenopausal) and you may become pregnant, seek counseling before you get pregnant. Take 400 to 800 micrograms (mcg) of folic acid every day if you become pregnant. Ask for birth control (contraception) if you want to prevent pregnancy. Osteoporosis and menopause Osteoporosis is a disease in which the bones lose minerals and strength with aging. This can result in bone fractures. If you are 44 years old or older, or if you are at risk for osteoporosis and fractures, ask your health care provider if you should: Be screened for bone loss. Take a calcium or vitamin D supplement to lower your risk of fractures. Be given hormone replacement therapy (HRT) to treat symptoms of menopause. Follow these instructions at home: Alcohol use Do not drink alcohol if: Your health care provider tells you not to drink. You are pregnant, may be pregnant, or are planning to become pregnant. If you drink alcohol: Limit how much you have to: 0-1 drink a day. Know how much alcohol is in your drink. In the [place] one drink equals one 12 oz bottle of beer (355 mL), one 5 oz glass of wine (148 mL), or one 1 oz glass of hard liquor (44 mL). Lifestyle Do not use any products that contain nicotine or tobacco. These products include cigarettes, chewing tobacco, and vaping devices, such as e-cigarettes. If you need help quitting, ask your health care provider. Do not use street drugs. Do not share needles. Ask your health care provider for help if you need support or information about quitting drugs. General instructions Schedule regular health, dental, and eye exams. Stay current with your vaccines. Tell your health care provider if: You often  feel depressed. You have ever been abused or do not feel safe at home. Summary Adopting a healthy lifestyle and getting preventive care are important in promoting health and wellness. Follow your health care provider's instructions about healthy diet, exercising, and getting tested or screened for diseases. Follow your health care provider's instructions on monitoring your cholesterol and blood pressure. This information is not intended to replace advice given to you by your health care provider. Make sure you discuss any questions you have with your health care provider. Document Revised: 05/02/2021 Document Reviewed: 05/02/2021 Elsevier Patient Education  2024 [employer].

## 2023-08-23 NOTE — Progress Notes (Signed)
Subjective:    Patient ID: Chloe Young, female    DOB: 05/21/1951, 72 y.o.   MRN: [id number]      HPI Chloe Young is here for a Physical exam and her chronic medical problems.   Neck is always sore whenever she moves it.  She did not want to go for the injections.  She thinks she may need to consider it or go back to see the specialist to see what other options there are.   Medications and allergies reviewed with patient and updated if appropriate.  Current Outpatient Medications on File Prior to Visit  Medication Sig Dispense Refill   acetaminophen (TYLENOL) 500 MG tablet Take 500-1,000 mg by mouth every 6 (six) hours as needed (for pain.).     alendronate (FOSAMAX) 70 MG tablet TAKE 1 TABLET BY MOUTH WEEKLY  WITH 8 OZ OF PLAIN WATER 30  MINUTES BEFORE FIRST FOOD, DRINK OR MEDS. STAY UPRIGHT FOR 30  MINS 12 tablet 3   amoxicillin (AMOXIL) 500 MG capsule Take 2,000 mg by mouth See admin instructions. Take 4 capsules (2000 mg) by mouth 1 hour prior to dental appointment     Calcium Carbonate-Vitamin D (CALCIUM + D PO) Take 1 tablet by mouth daily.     carboxymethylcellul-glycerin (LUBRICANT DROPS/DUAL-ACTION) 0.5-0.9 % ophthalmic solution Place 1 drop into both eyes 3 (three) times daily as needed for dry eyes.     cyclobenzaprine (FLEXERIL) 5 MG tablet Take 1-2 tablets (5-10 mg total) by mouth at bedtime as needed for muscle spasms. 30 tablet 2   fexofenadine (ALLEGRA) 180 MG tablet Take 180 mg by mouth in the morning.     Lidocaine 4 % PTCH Place 1 patch onto the skin daily as needed (pain (neck)).     methocarbamol (ROBAXIN) 500 MG tablet Take 1 tablet by mouth 4 (four) times daily as needed.     metoprolol tartrate (LOPRESSOR) 25 MG tablet Take 1/2 tablet (12.5 mg total) by mouth 2 (two) times daily. 180 tablet 3   Multiple Vitamin (MULITIVITAMIN WITH MINERALS) TABS Take 1 tablet by mouth in the morning.     omeprazole (PRILOSEC) 20 MG capsule TAKE 1 CAPSULE BY MOUTH DAILY 90 capsule  3   pregabalin (LYRICA) 150 MG capsule Take 1 capsule (150 mg total) by mouth 2 (two) times daily. 180 capsule 1   rosuvastatin (CRESTOR) 10 MG tablet TAKE 1 TABLET BY MOUTH DAILY 90 tablet 3   topiramate (TOPAMAX) 100 MG tablet TAKE 1 TABLET BY MOUTH TWICE  DAILY 180 tablet 3   No current facility-administered medications on file prior to visit.    Review of Systems  Constitutional:  Negative for fever.  Eyes:  Negative for visual disturbance.  Respiratory:  Negative for cough, shortness of breath and wheezing.   Cardiovascular:  Negative for chest pain, palpitations and leg swelling.  Gastrointestinal:  Negative for abdominal pain, blood in stool, constipation and diarrhea.       No gerd  Genitourinary:  Negative for dysuria.  Musculoskeletal:  Positive for back pain (chronic) and neck pain. Negative for arthralgias.  Skin:  Negative for rash.  Neurological:  Negative for light-headedness and headaches.  Psychiatric/Behavioral:  Negative for dysphoric mood. The patient is not nervous/anxious.        Objective:   Vitals:   08/24/23 1321  BP: 100/70  Pulse: 71  Temp: 98.2 F (36.8 C)  SpO2: 98%   Filed Weights   08/24/23 1321  Weight: 121 lb  12.8 oz (55.2 kg)   Body mass index is 21.58 kg/m.  BP Readings from Last 3 Encounters:  08/24/23 100/70  02/21/23 110/70  10/17/22 128/78    Wt Readings from Last 3 Encounters:  08/24/23 121 lb 12.8 oz (55.2 kg)  02/21/23 120 lb (54.4 kg)  10/17/22 121 lb 9.6 oz (55.2 kg)       Physical Exam Constitutional: She appears well-developed and well-nourished. No distress.  HENT:  Head: Normocephalic and atraumatic.  Right Ear: External ear normal. Normal ear canal and TM Left Ear: External ear normal.  Normal ear canal and TM Mouth/Throat: Oropharynx is clear and moist.  Eyes: Conjunctivae normal.  Neck: Neck supple. No tracheal deviation present. No thyromegaly present.  No carotid bruit  Cardiovascular: Normal rate,  regular rhythm and normal heart sounds.   No murmur heard.  No edema. Pulmonary/Chest: Effort normal and breath sounds normal. No respiratory distress. She has no wheezes. She has no rales.  Breast: deferred   Abdominal: Soft. She exhibits no distension. There is no tenderness.  Lymphadenopathy: She has no cervical adenopathy.  Skin: Skin is warm and dry. She is not diaphoretic.  Right great toe thickened and coming off distally from her skin.  Flaky, thin Psychiatric: She has a normal mood and affect. Her behavior is normal.     Lab Results  Component Value Date   WBC 4.5 02/21/2023   HGB 13.5 02/21/2023   HCT 40.6 02/21/2023   PLT 219.0 02/21/2023   GLUCOSE 89 02/21/2023   CHOL 174 02/21/2023   TRIG 86.0 02/21/2023   HDL 78.10 02/21/2023   LDLDIRECT 156.9 11/25/2013   LDLCALC 79 02/21/2023   ALT 33 02/21/2023   AST 32 02/21/2023   NA 138 02/21/2023   K 3.7 02/21/2023   CL 105 02/21/2023   CREATININE 0.65 02/21/2023   BUN 11 02/21/2023   CO2 27 02/21/2023   TSH 1.18 06/01/2021   INR 1.3 (H) 11/08/2020   HGBA1C 5.4 02/21/2023         Assessment & Plan:   Physical exam: Screening blood work  ordered Exercise  regular - walking Weight  normal  Substance abuse  none   Reviewed recommended immunizations.   Health Maintenance  Topic Date Due   DTaP/Tdap/Td (2 - Tdap) 04/20/2019   COVID-19 Vaccine (5 - 2023-24 season) 02/23/2023   Medicare Annual Wellness (AWV)  07/18/2023   INFLUENZA VACCINE  07/26/2023   MAMMOGRAM  06/25/2025   DEXA SCAN  06/25/2025   Colonoscopy  03/03/2031   Pneumonia Vaccine 27+ Years old  Completed   Hepatitis C Screening  Completed   Zoster Vaccines- Shingrix  Completed   HPV VACCINES  Aged Out          See Problem List for Assessment and Plan of chronic medical problems.

## 2023-08-24 ENCOUNTER — Other Ambulatory Visit: Payer: Self-pay | Admitting: Internal Medicine

## 2023-08-24 ENCOUNTER — Ambulatory Visit (INDEPENDENT_AMBULATORY_CARE_PROVIDER_SITE_OTHER): Payer: Medicare Other | Admitting: Internal Medicine

## 2023-08-24 ENCOUNTER — Encounter: Payer: Self-pay | Admitting: Internal Medicine

## 2023-08-24 VITALS — BP 100/70 | HR 71 | Temp 98.2°F | Ht 63.0 in | Wt 121.8 lb

## 2023-08-24 DIAGNOSIS — B351 Tinea unguium: Secondary | ICD-10-CM | POA: Diagnosis not present

## 2023-08-24 DIAGNOSIS — G8929 Other chronic pain: Secondary | ICD-10-CM | POA: Diagnosis not present

## 2023-08-24 DIAGNOSIS — G4486 Cervicogenic headache: Secondary | ICD-10-CM | POA: Diagnosis not present

## 2023-08-24 DIAGNOSIS — R7303 Prediabetes: Secondary | ICD-10-CM

## 2023-08-24 DIAGNOSIS — Z0000 Encounter for general adult medical examination without abnormal findings: Secondary | ICD-10-CM

## 2023-08-24 DIAGNOSIS — I10 Essential (primary) hypertension: Secondary | ICD-10-CM

## 2023-08-24 DIAGNOSIS — M810 Age-related osteoporosis without current pathological fracture: Secondary | ICD-10-CM

## 2023-08-24 DIAGNOSIS — M542 Cervicalgia: Secondary | ICD-10-CM

## 2023-08-24 DIAGNOSIS — E782 Mixed hyperlipidemia: Secondary | ICD-10-CM | POA: Diagnosis not present

## 2023-08-24 DIAGNOSIS — I700 Atherosclerosis of aorta: Secondary | ICD-10-CM | POA: Diagnosis not present

## 2023-08-24 DIAGNOSIS — K219 Gastro-esophageal reflux disease without esophagitis: Secondary | ICD-10-CM | POA: Diagnosis not present

## 2023-08-24 LAB — LIPID PANEL
Cholesterol: 170 mg/dL (ref 0–200)
HDL: 71.6 mg/dL
LDL Cholesterol: 83 mg/dL (ref 0–99)
NonHDL: 98.53
Total CHOL/HDL Ratio: 2
Triglycerides: 76 mg/dL (ref 0.0–149.0)
VLDL: 15.2 mg/dL (ref 0.0–40.0)

## 2023-08-24 LAB — COMPREHENSIVE METABOLIC PANEL
ALT: 26 U/L (ref 0–35)
AST: 30 U/L (ref 0–37)
Albumin: 4.3 g/dL (ref 3.5–5.2)
Alkaline Phosphatase: 47 U/L (ref 39–117)
BUN: 9 mg/dL (ref 6–23)
CO2: 27 mEq/L (ref 19–32)
Calcium: 9.7 mg/dL (ref 8.4–10.5)
Chloride: 104 mEq/L (ref 96–112)
Creatinine, Ser: 0.68 mg/dL (ref 0.40–1.20)
GFR: 86.48 mL/min (ref >60.00)
Glucose, Bld: 93 mg/dL (ref 70–99)
Potassium: 3.7 mEq/L (ref 3.5–5.1)
Sodium: 139 mEq/L (ref 135–145)
Total Bilirubin: 0.5 mg/dL (ref 0.2–1.2)
Total Protein: 6.9 g/dL (ref 6.0–8.3)

## 2023-08-24 LAB — CBC WITH DIFFERENTIAL/PLATELET
Basophils Absolute: 0 10*3/uL (ref 0.0–0.1)
Basophils Relative: 0.2 % (ref 0.0–3.0)
Eosinophils Absolute: 0.1 10*3/uL (ref 0.0–0.7)
Eosinophils Relative: 1.6 % (ref 0.0–5.0)
HCT: 37.5 % (ref 36.0–46.0)
Hemoglobin: 12.3 g/dL (ref 12.0–15.0)
Lymphocytes Relative: 43.4 % (ref 12.0–46.0)
Lymphs Abs: 1.9 10*3/uL (ref 0.7–4.0)
MCHC: 32.8 g/dL (ref 30.0–36.0)
MCV: 91.9 fl (ref 78.0–100.0)
Monocytes Absolute: 0.4 10*3/uL (ref 0.1–1.0)
Monocytes Relative: 10 % (ref 3.0–12.0)
Neutro Abs: 2 10*3/uL (ref 1.4–7.7)
Neutrophils Relative %: 44.8 % (ref 43.0–77.0)
Platelets: 210 10*3/uL (ref 150.0–400.0)
RBC: 4.08 Mil/uL (ref 3.87–5.11)
RDW: 14.3 % (ref 11.5–15.5)
WBC: 4.4 10*3/uL (ref 4.0–10.5)

## 2023-08-24 LAB — HEMOGLOBIN A1C: Hgb A1c MFr Bld: 5.4 % (ref 4.6–6.5)

## 2023-08-24 LAB — VITAMIN D 25 HYDROXY (VIT D DEFICIENCY, FRACTURES): VITD: 34.88 ng/mL (ref 30.00–100.00)

## 2023-08-24 LAB — TSH: TSH: 1.18 u[IU]/mL (ref 0.35–5.50)

## 2023-08-24 MED ORDER — OXYCODONE-ACETAMINOPHEN 7.5-325 MG PO TABS: 1.0000 | ORAL_TABLET | Freq: Three times a day (TID) | ORAL | 0 refills | Status: DC | PRN

## 2023-08-24 MED ORDER — FLUTICASONE PROPIONATE 50 MCG/ACT NA SUSP: 2.0000 | Freq: Every day | NASAL | 6 refills | Status: AC

## 2023-08-24 NOTE — Assessment & Plan Note (Signed)
Chronic Check a1c Low sugar / carb diet Stressed regular exercise  

## 2023-08-24 NOTE — Assessment & Plan Note (Signed)
Chronic Pain not ideally controlled Continue Percocet 7.5-325 mg 1 tab every 8 hours as needed Continue Lyrica 150 mg twice daily Continue Topamax 100 mg twice daily Continue Flexeril 5-10 mg bedtime as needed Continue ice, heat, lidocaine patches Will see neurosurgery again RE injections

## 2023-08-24 NOTE — Assessment & Plan Note (Signed)
Chronic pain S/p cervical fusion Has chronic neck pain and headaches Having soreness constantly and occ nerve pain that is more severe Continue current pain regimen Will go back and see neurosurgery about injections - had one in the past

## 2023-08-24 NOTE — Assessment & Plan Note (Signed)
Chronic Continue rosuvastatin 10 mg daily Regular exercise, healthy diet encouraged

## 2023-08-24 NOTE — Assessment & Plan Note (Signed)
Right great toe Will refer to podiatry for official diagnosis and treatment

## 2023-08-24 NOTE — Assessment & Plan Note (Signed)
Chronic Regular exercise and healthy diet encouraged Check lipid panel, cmp, tsh Continue Crestor 10 mg daily

## 2023-08-24 NOTE — Assessment & Plan Note (Signed)
Chronic Blood pressure well controlled CMP, cbc Continue metoprolol 12.5 mg twice daily

## 2023-08-24 NOTE — Assessment & Plan Note (Signed)
Chronic GERD controlled Continue omeprazole 20 mg daily  

## 2023-08-24 NOTE — Assessment & Plan Note (Addendum)
Chronic DEXA shows worsening bone density  Continue Fosamax 70 mg weekly-started 09/2019-plan for 5 years initially and we will continue this for now Given worsening bone density will look into Evenity-if this is covered by insurance we will start this and then transition to Prolia most likely Continue calcium and vitamin D supplementation-stressed taking this on a regular basis Check vitamin D level, CMP

## 2023-08-24 NOTE — Assessment & Plan Note (Signed)
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

## 2023-08-28 ENCOUNTER — Ambulatory Visit: Payer: Self-pay | Admitting: Licensed Clinical Social Worker

## 2023-08-28 NOTE — Patient Outreach (Signed)
  Care Coordination   Follow Up Visit Note   08/28/2023 Name: Chloe Young MRN: [id number] DOB: June 29, 1951  Chloe Young is a 72 y.o. year old female who sees Burns, Bobette Mo, MD for primary care. I spoke with  [name] by phone today.  What matters to the patients health and wellness today?  Patient recently had COVID-19. She is recovering from COVID-19     Goals Addressed               This Visit's Progress     patient recently had COVID-19. She is recovering from COVID-19 (pt-stated)        Interventions:  Spoke with [name], via phone today about her current status and about her current needs Discussed program support for client with RN, Pharmacist, LCSW Discussed transport needs of client. She does not have any transport needs at present Client said she recently had COVID-19 and is recovering from COVID-19. She feels that she is doing better. Her energy level has improved Provided counseling support for client Discussed medication procurement for client Discussed status of her spouse who resides at ALF facility. Discussed mood of client. She enjoys relaxing by walking her dogs; she enjoys socializing with her friends to help her relax. She said she feels that her mood is stable at present  Reviewed pain issues.  Discussed client caring for her pets as a way for client to relax Encouraged client to call LCSW as needed for SW support at [phone].          SDOH assessments and interventions completed:  Yes  SDOH Interventions Today    Flowsheet Row Most Recent Value  SDOH Interventions   Depression Interventions/Treatment  Counseling  Stress Interventions Provide Counseling  [client has stress related to the needs of her spouse. Her spouse resides at ALF facility where he receives care]        Care Coordination Interventions:  Yes, provided   Interventions Today    Flowsheet Row Most Recent Value  Chronic Disease   Chronic disease during  today's visit Other  [spoke with client about client needs]  General Interventions   General Interventions Discussed/Reviewed General Interventions Discussed, Community Resources  Exercise Interventions   Exercise Discussed/Reviewed Physical Activity  [client likes to walk for exercise]  Physical Activity Discussed/Reviewed Physical Activity Discussed  Education Interventions   Education Provided Provided Education  Provided Verbal Education On Community Resources  Mental Health Interventions   Mental Health Discussed/Reviewed Mental Health Discussed, Anxiety  [no mood issues noted by client]  Pharmacy Interventions   Pharmacy Dicussed/Reviewed Pharmacy Topics Discussed        Follow up plan: LCSW to call client as scheduled to assess client needs at that time   Encounter Outcome:  Pt. Visit Completed   Kelton Pillar.Delitha Elms MSW, LCSW Licensed Visual merchandiser [facility] Care Management [phone]

## 2023-08-28 NOTE — Patient Instructions (Signed)
Visit Information  Thank you for taking time to visit with me today. Please don't hesitate to contact me if I can be of assistance to you.   Following are the goals we discussed today:   Goals Addressed               This Visit's Progress     patient recently had COVID-19. She is recovering from COVID-19 (pt-stated)        Interventions:  Spoke with [name], via phone today about her current status and about her current needs Discussed program support for client with RN, Pharmacist, LCSW Discussed transport needs of client. She does not have any transport needs at present Client said she recently had COVID-19 and is recovering from COVID-19. She feels that she is doing better. Her energy level has improved Provided counseling support for client Discussed medication procurement for client Discussed status of her spouse who resides at ALF facility. Discussed mood of client. She enjoys relaxing by walking her dogs; she enjoys socializing with her friends to help her relax. She said she feels that her mood is stable at present  Reviewed pain issues.  Discussed client caring for her pets as a way for client to relax Encouraged client to call LCSW as needed for SW support at [phone].         LCSW to call client as scheduled to assess client needs at that time   Please call the care guide team at [phone] if you need to cancel or reschedule your appointment.   If you are experiencing a Mental Health or Behavioral Health Crisis or need someone to talk to, please go to [facility] [address] [phone])   The patient verbalized understanding of instructions, educational materials, and care plan provided today and DECLINED offer to receive copy of patient instructions, educational materials, and care plan.   The patient has been provided with contact information for the care management team and has been advised to  call with any health related questions or concerns.   Kelton Pillar.Samiya Mervin MSW, LCSW Licensed Visual merchandiser [facility] Care Management [phone]

## 2023-08-31 ENCOUNTER — Telehealth: Payer: Self-pay | Admitting: Internal Medicine

## 2023-08-31 NOTE — Telephone Encounter (Signed)
Patient called and said she and Dr. Lawerance Bach talked about her getting an Evenity injection. She said she hasn't heard back and wanted to know the status. Patient would like a call back at [phone].

## 2023-09-03 ENCOUNTER — Telehealth: Payer: Self-pay

## 2023-09-03 ENCOUNTER — Other Ambulatory Visit (HOSPITAL_COMMUNITY): Payer: Self-pay

## 2023-09-03 NOTE — Telephone Encounter (Signed)
Can you call her and see if she is will to try reclast -- it is an infusion once a year for three years.   Flu like symptoms are possible after having the infusion.     If she agrees them see about getting approval

## 2023-09-03 NOTE — Telephone Encounter (Signed)
Evenity VOB initiated via [email or web address] 

## 2023-09-03 NOTE — Telephone Encounter (Signed)
Pt called wanting to know how much is her evenity shot going to cost her before scheduling appt.

## 2023-09-03 NOTE — Telephone Encounter (Signed)
Prior Authorization form/request asks a question that requires your assistance. Please see the question below and advise accordingly.       Insurance requires failure, contraindication, or intolerance to both an oral and IV bisphosphate. Please advise.

## 2023-09-03 NOTE — Telephone Encounter (Signed)
Spoke with patient today and questions answered.  I will update her once I hear back when she is ready to be scheduled.

## 2023-09-06 NOTE — Telephone Encounter (Signed)
Message left for patient.  If she calls back okay to see if she is open to trying the reclast and send me a message back.

## 2023-09-07 NOTE — Telephone Encounter (Signed)
Patient returned Carla's call. She said she is not sure about reclast. She has questions she would like to ask before she decides. Patient would like a call back at [phone].

## 2023-09-10 ENCOUNTER — Telehealth: Payer: Self-pay | Admitting: Internal Medicine

## 2023-09-10 NOTE — Telephone Encounter (Signed)
Pt states that her nurse called her and she missed her call and would like for her to give her a call back she promise to be by her phone for your call.Marland Kitchen

## 2023-09-11 NOTE — Telephone Encounter (Signed)
Spoke with patient today. Bringing her back in to discuss as she had several questions and concerns.

## 2023-09-17 ENCOUNTER — Other Ambulatory Visit: Payer: Self-pay | Admitting: Internal Medicine

## 2023-09-18 ENCOUNTER — Ambulatory Visit: Payer: Self-pay | Admitting: Licensed Clinical Social Worker

## 2023-09-18 NOTE — Patient Outreach (Signed)
Care Coordination   09/18/2023 Name: Chloe Young MRN: [id number] DOB: 1951-12-01   Care Coordination Outreach Attempts:  An unsuccessful telephone outreach was attempted today to offer the patient information about available care coordination services.  Follow Up Plan:  Additional outreach attempts will be made to offer the patient care coordination information and services.   Encounter Outcome:  No Answer   Care Coordination Interventions:  No, not indicated    Kelton Pillar.Chantella Creech MSW, LCSW Licensed Visual merchandiser [facility] Care Management [phone]

## 2023-09-19 ENCOUNTER — Ambulatory Visit: Payer: Medicare Other | Admitting: Podiatry

## 2023-09-19 ENCOUNTER — Encounter: Payer: Self-pay | Admitting: Podiatry

## 2023-09-19 DIAGNOSIS — B351 Tinea unguium: Secondary | ICD-10-CM | POA: Diagnosis not present

## 2023-09-19 NOTE — Addendum Note (Signed)
Addended by: Daryel November on: 09/19/2023 11:47 AM   Modules accepted: Orders

## 2023-09-19 NOTE — Progress Notes (Signed)
Subjective:  Patient ID: Chloe Young, female    DOB: [birth year],   MRN: [id number]  No chief complaint on file.   72 y.o. female presents for concern of nail changes in right great toenail. Relates has been going on for years and has tried multiple OTC treatments with no relief.  . Denies any other pedal complaints. Denies n/v/f/c.   Past Medical History:  Diagnosis Date   Cervical facet syndrome    Cervical post-laminectomy syndrome    Dr Hermelinda Medicus[facility]   Cervicalgia    Chronic pain syndrome    Closed fracture of proximal end of right humerus 03/08/2021   Family history of adverse reaction to anesthesia    sister slow to wake up and nausea   GERD (gastroesophageal reflux disease)    Headache    due to neck fusion   Hyperlipidemia    joint pain with statin   Hypertension    Osteopenia 06/2016   T score -1.6 FRAX not calculated   Perforated abdominal viscus    Severe sepsis (HCC) 11/08/2020    Objective:  Physical Exam: Vascular: DP/PT pulses 2/4 bilateral. CFT <3 seconds. Normal hair growth on digits. No edema.  Skin. No lacerations or abrasions bilateral feet. Right hallux nail distally discolored and lifted from nail plate with subugnual debris.  Musculoskeletal: MMT 5/5 bilateral lower extremities in DF, PF, Inversion and Eversion. Deceased ROM in DF of ankle joint.  Neurological: Sensation intact to light touch.   Assessment:   1. Onychomycosis      Plan:  Patient was evaluated and treated and all questions answered. -Examined patient -Discussed treatment options for painful dystrophic nails  -Clinical picture and Fungal culture was obtained by removing a portion of the hard nail itself from each of the involved toenails using a sterile nail nipper and sent to [facility] lab. Patient tolerated the biopsy procedure well without discomfort or need for anesthesia.  -Discussed fungal nail treatment options including oral, topical, and laser treatments.  -Patient  to return in 4 weeks for follow up evaluation and discussion of fungal culture results or sooner if symptoms worsen.   Louann Sjogren, DPM

## 2023-09-21 ENCOUNTER — Encounter: Payer: Self-pay | Admitting: Internal Medicine

## 2023-09-21 ENCOUNTER — Ambulatory Visit (INDEPENDENT_AMBULATORY_CARE_PROVIDER_SITE_OTHER): Payer: Medicare Other | Admitting: Internal Medicine

## 2023-09-21 VITALS — BP 104/72 | HR 71 | Temp 98.7°F | Ht 63.0 in | Wt 124.0 lb

## 2023-09-21 DIAGNOSIS — G4486 Cervicogenic headache: Secondary | ICD-10-CM | POA: Diagnosis not present

## 2023-09-21 DIAGNOSIS — M810 Age-related osteoporosis without current pathological fracture: Secondary | ICD-10-CM | POA: Diagnosis not present

## 2023-09-21 NOTE — Patient Instructions (Signed)
     I will let you know about reclast.

## 2023-09-22 NOTE — Assessment & Plan Note (Signed)
Chronic Pain fairly controlled Continue Percocet 7.5-325 mg 1 tab every 8 hours as needed Continue Lyrica 150 mg twice daily Continue Topamax 100 mg twice daily Continue Flexeril 5-10 mg bedtime as needed Continue ice, heat, lidocaine patches Was planning on revisiting neurosurgery again RE injections

## 2023-09-22 NOTE — Assessment & Plan Note (Signed)
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

## 2023-10-10 ENCOUNTER — Other Ambulatory Visit: Payer: Self-pay | Admitting: Internal Medicine

## 2023-10-17 ENCOUNTER — Ambulatory Visit: Payer: Medicare Other | Admitting: Podiatry

## 2023-10-21 ENCOUNTER — Encounter: Payer: Self-pay | Admitting: Internal Medicine

## 2023-10-22 NOTE — Telephone Encounter (Signed)
Patient called back and states that she would like to start these injections, please advise

## 2023-10-23 ENCOUNTER — Encounter: Payer: Self-pay | Admitting: Podiatry

## 2023-10-23 ENCOUNTER — Ambulatory Visit: Payer: Medicare Other | Admitting: Podiatry

## 2023-10-23 DIAGNOSIS — B351 Tinea unguium: Secondary | ICD-10-CM

## 2023-10-23 MED ORDER — TERBINAFINE HCL 250 MG PO TABS: 250.0000 mg | ORAL_TABLET | Freq: Every day | ORAL | 2 refills | Status: AC

## 2023-10-23 NOTE — Progress Notes (Signed)
  Subjective:  Patient ID: Chloe Young, female    DOB: December 03, 1951,   MRN: [id number]  Chief Complaint  Patient presents with   Nail Problem    RM#21 right nail fungus review results    72 y.o. female presents for follow-up of fungal nails and reivew culture results.  . Denies any other pedal complaints. Denies n/v/f/c.   Past Medical History:  Diagnosis Date   Cervical facet syndrome    Cervical post-laminectomy syndrome    Dr Hermelinda [facility]   Cervicalgia    Chronic pain syndrome    Closed fracture of proximal end of right humerus 03/08/2021   Family history of adverse reaction to anesthesia    sister slow to wake up and nausea   GERD (gastroesophageal reflux disease)    Headache    due to neck fusion   Hyperlipidemia    joint pain with statin   Hypertension    Osteopenia 06/2016   T score -1.6 FRAX not calculated   Perforated abdominal viscus    Severe sepsis (HCC) 11/08/2020    Objective:  Physical Exam: Vascular: DP/PT pulses 2/4 bilateral. CFT <3 seconds. Normal hair growth on digits. No edema.  Skin. No lacerations or abrasions bilateral feet. Right hallux nail distally discolored and lifted from nail plate with subugnual debris.  Musculoskeletal: MMT 5/5 bilateral lower extremities in DF, PF, Inversion and Eversion. Deceased ROM in DF of ankle joint.  Neurological: Sensation intact to light touch.   Assessment:   1. Onychomycosis       Plan:  Patient was evaluated and treated and all questions answered. -Examined patient -Discussed treatment options for painful dystrophic nails  -Culture positive for moderate fungal growth.  -Discussed fungal nail treatment options including oral, topical, and laser treatments.  -Previous LFTs in normal range.  -Patient would like to proceed with lamisil. Prescription provided.   -Patient to return in 3 months for recheck.    Louann Sjogren, DPM

## 2023-10-25 ENCOUNTER — Other Ambulatory Visit: Payer: Self-pay

## 2023-10-25 DIAGNOSIS — M810 Age-related osteoporosis without current pathological fracture: Secondary | ICD-10-CM

## 2023-10-25 DIAGNOSIS — I10 Essential (primary) hypertension: Secondary | ICD-10-CM

## 2023-10-26 ENCOUNTER — Telehealth: Payer: Self-pay | Admitting: Internal Medicine

## 2023-10-26 ENCOUNTER — Other Ambulatory Visit (INDEPENDENT_AMBULATORY_CARE_PROVIDER_SITE_OTHER): Payer: Medicare Other

## 2023-10-26 DIAGNOSIS — I10 Essential (primary) hypertension: Secondary | ICD-10-CM

## 2023-10-26 DIAGNOSIS — M810 Age-related osteoporosis without current pathological fracture: Secondary | ICD-10-CM | POA: Diagnosis not present

## 2023-10-26 LAB — COMPREHENSIVE METABOLIC PANEL
ALT: 27 U/L (ref 0–35)
AST: 27 U/L (ref 0–37)
Albumin: 4.6 g/dL (ref 3.5–5.2)
Alkaline Phosphatase: 50 U/L (ref 39–117)
BUN: 10 mg/dL (ref 6–23)
CO2: 28 meq/L (ref 19–32)
Calcium: 9.4 mg/dL (ref 8.4–10.5)
Chloride: 106 meq/L (ref 96–112)
Creatinine, Ser: 0.68 mg/dL (ref 0.40–1.20)
GFR: 86.38 mL/min (ref >60.00)
Glucose, Bld: 73 mg/dL (ref 70–99)
Potassium: 3.9 meq/L (ref 3.5–5.1)
Sodium: 140 meq/L (ref 135–145)
Total Bilirubin: 0.5 mg/dL (ref 0.2–1.2)
Total Protein: 7.4 g/dL (ref 6.0–8.3)

## 2023-10-26 NOTE — Telephone Encounter (Signed)
FYI... Pt wanting to stop by the lab about 2ish  about what was discuss in the messages on yesterday. please advise.

## 2023-10-26 NOTE — Telephone Encounter (Signed)
Labs were placed yesterday

## 2023-11-02 NOTE — Telephone Encounter (Signed)
Patient would like to know when she is supposed to start getting these injections. She would like a call back at [phone].

## 2023-11-06 ENCOUNTER — Telehealth: Payer: Self-pay | Admitting: Internal Medicine

## 2023-11-06 NOTE — Telephone Encounter (Signed)
Pt called wanting Albin Felling to know she need a Prior authorization to get her injection (Reclast)  on Thursday. Please advise.

## 2023-11-06 NOTE — Telephone Encounter (Signed)
Spoke with patient today. She does not need a PA.

## 2023-11-07 ENCOUNTER — Other Ambulatory Visit (HOSPITAL_COMMUNITY): Payer: Self-pay | Admitting: *Deleted

## 2023-11-08 ENCOUNTER — Ambulatory Visit (HOSPITAL_COMMUNITY)
Admission: RE | Admit: 2023-11-08 | Discharge: 2023-11-08 | Disposition: A | Discharge status: Home / Self Care | Payer: Medicare Other | Source: Ambulatory Visit | Attending: Internal Medicine | Admitting: Internal Medicine

## 2023-11-08 DIAGNOSIS — M810 Age-related osteoporosis without current pathological fracture: Secondary | ICD-10-CM | POA: Diagnosis not present

## 2023-11-08 MED ORDER — ZOLEDRONIC ACID 5 MG/100ML IV SOLN
INTRAVENOUS | Status: AC
  Filled 2023-11-08: qty 100

## 2023-11-08 MED ORDER — ZOLEDRONIC ACID 5 MG/100ML IV SOLN
5.0000 mg | Freq: Once | INTRAVENOUS | Status: AC
  Administered 2023-11-08: 5 mg via INTRAVENOUS

## 2023-11-14 ENCOUNTER — Other Ambulatory Visit: Payer: Self-pay | Admitting: Internal Medicine

## 2023-11-25 ENCOUNTER — Encounter: Payer: Self-pay | Admitting: Internal Medicine

## 2023-12-01 ENCOUNTER — Encounter: Payer: Self-pay | Admitting: Internal Medicine

## 2023-12-24 ENCOUNTER — Other Ambulatory Visit: Payer: Self-pay | Admitting: Internal Medicine

## 2023-12-24 DIAGNOSIS — G4486 Cervicogenic headache: Secondary | ICD-10-CM

## 2023-12-24 NOTE — Telephone Encounter (Signed)
Last UDS did not show prescribed substance and none since also fills of oxycodone from additional providers this year. Needs UDS once collected route message back to me.

## 2023-12-25 NOTE — Telephone Encounter (Unsigned)
 Copied from CRM [id number]. Topic: Clinical - Medication Refill >> Dec 25, 2023 11:27 AM Leila C wrote: Most Recent Primary Care Visit:  Provider: [facility]  Department: [facility]  Visit Type: LAB  Date: 10/26/2023  Medication: oxyCODONE -acetaminophen  (PERCOCET) 7.5-325 MG tablet  Has the patient contacted their pharmacy? Yes (Agent: If no, request that the patient contact the pharmacy for the refill. If patient does not wish to contact the pharmacy document the reason why and proceed with request.) (Agent: If yes, when and what did the pharmacy advise?)  Is this the correct pharmacy for this prescription? Yes If no, delete pharmacy and type the correct one.  This is the patient's preferred pharmacy:  [facility] [place], [place] - [address] [place] [address] Phone: [phone] Fax: [phone]  Has the prescription been filled recently?   Is the patient out of the medication? Yes, patient is asking med called into the pharmacy today.   Has the patient been seen for an appointment in the last year OR does the patient have an upcoming appointment?   Can we respond through MyChart? No, please call [phone].  Agent: Please be advised that Rx refills may take up to 3 business days. We ask that you follow-up with your pharmacy.

## 2023-12-25 NOTE — Telephone Encounter (Signed)
Left message for patient and mychart sent to her as well.

## 2023-12-27 NOTE — Telephone Encounter (Signed)
 Last read by [name] at 11:31 AM on 12/25/2023.

## 2024-01-23 ENCOUNTER — Other Ambulatory Visit: Payer: Self-pay | Admitting: Internal Medicine

## 2024-01-23 ENCOUNTER — Encounter: Payer: Self-pay | Admitting: Podiatry

## 2024-01-23 ENCOUNTER — Ambulatory Visit: Payer: Medicare Other | Admitting: Podiatry

## 2024-01-23 DIAGNOSIS — B351 Tinea unguium: Secondary | ICD-10-CM | POA: Diagnosis not present

## 2024-01-23 NOTE — Progress Notes (Signed)
  Subjective:  Patient ID: Chloe Young, female    DOB: 11-May-1951,   MRN: [id number]  Chief Complaint  Patient presents with   Nail Problem    RFC    73 y.o. female presents for follow-up of fungal nails. Has been taking the lamisil   Relates doing better.  . Denies any other pedal complaints. Denies n/v/f/c.   Past Medical History:  Diagnosis Date   Cervical facet syndrome    Cervical post-laminectomy syndrome    Dr Hermelinda [facility]   Cervicalgia    Chronic pain syndrome    Closed fracture of proximal end of right humerus 03/08/2021   Family history of adverse reaction to anesthesia    sister slow to wake up and nausea   GERD (gastroesophageal reflux disease)    Headache    due to neck fusion   Hyperlipidemia    joint pain with statin   Hypertension    Osteopenia 06/2016   T score -1.6 FRAX not calculated   Perforated abdominal viscus    Severe sepsis (HCC) 11/08/2020    Objective:  Physical Exam: Vascular: DP/PT pulses 2/4 bilateral. CFT <3 seconds. Normal hair growth on digits. No edema.  Skin. No lacerations or abrasions bilateral feet. Right hallux nail distally improved with some lifiting but subunual debris distal 1/3.  Musculoskeletal: MMT 5/5 bilateral lower extremities in DF, PF, Inversion and Eversion. Deceased ROM in DF of ankle joint.  Neurological: Sensation intact to light touch.   Assessment:   1. Onychomycosis       Plan:  Patient was evaluated and treated and all questions answered. -Examined patient -Discussed treatment options for painful dystrophic nails  -Culture positive for moderate fungal growth.  -Discussed fungal nail treatment options including oral, topical, and laser treatments.  -Feel we have cure will stop treatments.  -Follow-up as needed   Louann Sjogren, DPM

## 2024-02-06 ENCOUNTER — Other Ambulatory Visit: Payer: Self-pay | Admitting: Internal Medicine

## 2024-02-10 ENCOUNTER — Other Ambulatory Visit: Payer: Self-pay | Admitting: Internal Medicine

## 2024-02-19 ENCOUNTER — Ambulatory Visit: Payer: Medicare Other

## 2024-03-05 ENCOUNTER — Other Ambulatory Visit: Payer: Self-pay | Admitting: Internal Medicine

## 2024-03-07 ENCOUNTER — Telehealth: Payer: Self-pay

## 2024-03-07 NOTE — Telephone Encounter (Signed)
 Called and left message for patient to return call to clinic.  She needs return office 6 month visit.  Was due to come back 02/22/24.  If she calls back please schedule.

## 2024-03-14 ENCOUNTER — Ambulatory Visit (INDEPENDENT_AMBULATORY_CARE_PROVIDER_SITE_OTHER): Admitting: Internal Medicine

## 2024-03-14 ENCOUNTER — Encounter: Payer: Self-pay | Admitting: Internal Medicine

## 2024-03-14 VITALS — BP 108/68 | HR 59 | Temp 98.0°F | Ht 63.0 in | Wt 123.4 lb

## 2024-03-14 DIAGNOSIS — E782 Mixed hyperlipidemia: Secondary | ICD-10-CM

## 2024-03-14 DIAGNOSIS — K219 Gastro-esophageal reflux disease without esophagitis: Secondary | ICD-10-CM | POA: Diagnosis not present

## 2024-03-14 DIAGNOSIS — G4486 Cervicogenic headache: Secondary | ICD-10-CM

## 2024-03-14 DIAGNOSIS — R7303 Prediabetes: Secondary | ICD-10-CM | POA: Diagnosis not present

## 2024-03-14 DIAGNOSIS — I10 Essential (primary) hypertension: Secondary | ICD-10-CM

## 2024-03-14 DIAGNOSIS — G8929 Other chronic pain: Secondary | ICD-10-CM

## 2024-03-14 DIAGNOSIS — M810 Age-related osteoporosis without current pathological fracture: Secondary | ICD-10-CM | POA: Diagnosis not present

## 2024-03-14 LAB — COMPREHENSIVE METABOLIC PANEL
ALT: 22 U/L (ref 0–35)
AST: 27 U/L (ref 0–37)
Albumin: 4.8 g/dL (ref 3.5–5.2)
Alkaline Phosphatase: 48 U/L (ref 39–117)
BUN: 11 mg/dL (ref 6–23)
CO2: 28 meq/L (ref 19–32)
Calcium: 9.6 mg/dL (ref 8.4–10.5)
Chloride: 105 meq/L (ref 96–112)
Creatinine, Ser: 0.61 mg/dL (ref 0.40–1.20)
GFR: 88.43 mL/min (ref >60.00)
Glucose, Bld: 93 mg/dL (ref 70–99)
Potassium: 3.8 meq/L (ref 3.5–5.1)
Sodium: 139 meq/L (ref 135–145)
Total Bilirubin: 0.5 mg/dL (ref 0.2–1.2)
Total Protein: 7.4 g/dL (ref 6.0–8.3)

## 2024-03-14 LAB — LIPID PANEL
Cholesterol: 172 mg/dL (ref 0–200)
HDL: 77.8 mg/dL (ref >39.00)
LDL Cholesterol: 74 mg/dL (ref 0–99)
NonHDL: 94.21
Total CHOL/HDL Ratio: 2
Triglycerides: 102 mg/dL (ref 0.0–149.0)
VLDL: 20.4 mg/dL (ref 0.0–40.0)

## 2024-03-14 LAB — VITAMIN D 25 HYDROXY (VIT D DEFICIENCY, FRACTURES): VITD: 31.49 ng/mL (ref 30.00–100.00)

## 2024-03-14 NOTE — Assessment & Plan Note (Addendum)
 Chronic Last dexa 06/2023 - compared to dexa from 03/2020 bone density is worse on fosamax since 09/2019 - 2024 She is walking She is taking calcium and vitamin d daily Has failed fosamax Prolia was not covered Reclast No. 1 11/08/2023-plan for 3 years Check vitamin D level

## 2024-03-14 NOTE — Assessment & Plan Note (Signed)
 Chronic headaches-cervicogenic in nature Pain tolerable Percocet 7.5-325 mg 1 tab every 8 hours as needed, Lyrica 150 mg twice daily as needed, Topamax 100 mg twice daily, Flexeril 5-10 mg at night Having increased soreness and pain with turning neck - has nerve pain --- will go back to see neurosurgery [facility] controlled substance database checked today-taking medication appropriately

## 2024-03-14 NOTE — Assessment & Plan Note (Signed)
Chronic GERD controlled Continue omeprazole 20 mg daily  

## 2024-03-14 NOTE — Assessment & Plan Note (Signed)
Chronic Check a1c Low sugar / carb diet Stressed regular exercise  

## 2024-03-14 NOTE — Assessment & Plan Note (Addendum)
 Chronic Pain fairly controlled Pain varies day to day Continue Percocet 7.5-325 mg 1 tab every 8 hours as needed Continue Lyrica 150 mg twice daily Continue Topamax 100 mg twice daily Continue Flexeril 5-10 mg bedtime as needed Continue ice, heat, lidocaine patches Was planning on revisiting neurosurgery again RE injections Urine tox, pain contract signed today

## 2024-03-14 NOTE — Assessment & Plan Note (Signed)
Chronic Regular exercise and healthy diet encouraged Check lipid panel, cmp, tsh Continue Crestor 10 mg daily

## 2024-03-14 NOTE — Patient Instructions (Addendum)
      Blood work was ordered.       Medications changes include :   None      Return in about 6 months (around 09/14/2024) for Physical Exam.

## 2024-03-14 NOTE — Progress Notes (Signed)
 Subjective:    Patient ID: Chloe Young, female    DOB: 30-Jun-1951, 73 y.o.   MRN: [id number]     HPI Chloe Young is here for follow up of her chronic medical problems.   She walks her dogs daily.  Overall doing well.    Medications and allergies reviewed with patient and updated if appropriate.  Current Outpatient Medications on File Prior to Visit  Medication Sig Dispense Refill   acetaminophen (TYLENOL) 500 MG tablet Take 500-1,000 mg by mouth every 6 (six) hours as needed (for pain.).     amoxicillin (AMOXIL) 500 MG capsule Take 2,000 mg by mouth See admin instructions. Take 4 capsules (2000 mg) by mouth 1 hour prior to dental appointment     Calcium Carbonate-Vitamin D (CALCIUM + D PO) Take 1 tablet by mouth daily.     carboxymethylcellul-glycerin (LUBRICANT DROPS/DUAL-ACTION) 0.5-0.9 % ophthalmic solution Place 1 drop into both eyes 3 (three) times daily as needed for dry eyes.     cyclobenzaprine (FLEXERIL) 5 MG tablet Take 1-2 tablets (5-10 mg total) by mouth at bedtime as needed for muscle spasms. 30 tablet 2   fexofenadine (ALLEGRA) 180 MG tablet Take 180 mg by mouth in the morning.     fluticasone (FLONASE) 50 MCG/ACT nasal spray Place 2 sprays into both nostrils daily. 16 g 6   Lidocaine 4 % PTCH Place 1 patch onto the skin daily as needed (pain (neck)).     methocarbamol (ROBAXIN) 500 MG tablet Take 1 tablet by mouth 4 (four) times daily as needed.     metoprolol tartrate (LOPRESSOR) 25 MG tablet TAKE 1/2 TABLET BY MOUTH TWICE DAILY 180 tablet 3   Multiple Vitamin (MULITIVITAMIN WITH MINERALS) TABS Take 1 tablet by mouth in the morning.     omeprazole (PRILOSEC) 20 MG capsule TAKE 1 CAPSULE BY MOUTH DAILY 100 capsule 2   oxyCODONE-acetaminophen (PERCOCET) 7.5-325 MG tablet Take 1 tablet by mouth every 8 (eight) hours as needed (chronic neck pain). 60 tablet 0   pregabalin (LYRICA) 150 MG capsule TAKE ONE CAPSULE BY MOUTH TWICE DAILY 180 capsule 1   rosuvastatin  (CRESTOR) 10 MG tablet TAKE 1 TABLET BY MOUTH DAILY 90 tablet 3   topiramate (TOPAMAX) 100 MG tablet TAKE 1 TABLET BY MOUTH TWICE  DAILY 200 tablet 2   No current facility-administered medications on file prior to visit.     Review of Systems  Constitutional:  Negative for fever.  HENT:  Negative for trouble swallowing.   Respiratory:  Negative for cough, shortness of breath and wheezing.   Cardiovascular:  Negative for chest pain, palpitations and leg swelling.  Gastrointestinal:  Negative for abdominal pain.  Neurological:  Positive for headaches. Negative for light-headedness.       Objective:   Vitals:   03/14/24 1424  BP: 108/68  Pulse: (!) 59  Temp: 98 F (36.7 C)  SpO2: 99%   BP Readings from Last 3 Encounters:  03/14/24 108/68  11/08/23 125/75  09/21/23 104/72   Wt Readings from Last 3 Encounters:  03/14/24 123 lb 6.4 oz (56 kg)  11/08/23 120 lb (54.4 kg)  09/21/23 124 lb (56.2 kg)   Body mass index is 21.86 kg/m.    Physical Exam Constitutional:      General: She is not in acute distress.    Appearance: Normal appearance.  HENT:     Head: Normocephalic and atraumatic.  Eyes:     Conjunctiva/sclera: Conjunctivae normal.  Cardiovascular:  Rate and Rhythm: Normal rate and regular rhythm.     Heart sounds: Normal heart sounds.  Pulmonary:     Effort: Pulmonary effort is normal. No respiratory distress.     Breath sounds: Normal breath sounds. No wheezing.  Musculoskeletal:     Cervical back: Neck supple.     Right lower leg: No edema.     Left lower leg: No edema.     Comments: Decreased ROM of neck esp to left  Lymphadenopathy:     Cervical: No cervical adenopathy.  Skin:    General: Skin is warm and dry.     Findings: No rash.  Neurological:     Mental Status: She is alert. Mental status is at baseline.  Psychiatric:        Mood and Affect: Mood normal.        Behavior: Behavior normal.        Lab Results  Component Value Date    WBC 4.4 08/24/2023   HGB 12.3 08/24/2023   HCT 37.5 08/24/2023   PLT 210.0 08/24/2023   GLUCOSE 73 10/26/2023   CHOL 170 08/24/2023   TRIG 76.0 08/24/2023   HDL 71.60 08/24/2023   LDLDIRECT 156.9 11/25/2013   LDLCALC 83 08/24/2023   ALT 27 10/26/2023   AST 27 10/26/2023   NA 140 10/26/2023   K 3.9 10/26/2023   CL 106 10/26/2023   CREATININE 0.68 10/26/2023   BUN 10 10/26/2023   CO2 28 10/26/2023   TSH 1.18 08/24/2023   INR 1.3 (H) 11/08/2020   HGBA1C 5.4 08/24/2023     Assessment & Plan:    See Problem List for Assessment and Plan of chronic medical problems.

## 2024-03-14 NOTE — Assessment & Plan Note (Signed)
 Chronic Blood pressure well controlled CMP Continue metoprolol 12.5 mg twice daily

## 2024-03-17 LAB — DM TEMPLATE

## 2024-03-17 LAB — DRUG MONITOR, OXYCODONE,W/CONF, URINE
Noroxycodone: 1485 ng/mL — ABNORMAL HIGH (ref <50)
Oxycodone: 276 ng/mL — ABNORMAL HIGH (ref <50)
Oxycodone: POSITIVE ng/mL — AB (ref <100)
Oxymorphone: NEGATIVE ng/mL (ref <50)

## 2024-03-17 LAB — DRUG MONITOR, TRAMADOL,QN, URINE
Desmethyltramadol: NEGATIVE ng/mL (ref <100)
Tramadol: NEGATIVE ng/mL (ref <100)

## 2024-03-17 LAB — DRUG MONITOR, OPIATES,W/CONF, URINE
Codeine: NEGATIVE ng/mL (ref <50)
Hydrocodone: NEGATIVE ng/mL (ref <50)
Hydromorphone: NEGATIVE ng/mL (ref <50)
Morphine: NEGATIVE ng/mL (ref <50)
Norhydrocodone: NEGATIVE ng/mL (ref <50)
Opiates: NEGATIVE ng/mL (ref <100)

## 2024-03-17 LAB — DRUG MONITOR,AMPHETAMINE,W/CONF, URINE: Amphetamines: NEGATIVE ng/mL (ref <500)

## 2024-03-17 LAB — PRESCRIBED DRUGS,MEDMATCH(R)

## 2024-03-17 LAB — DRUG MONITOR, BENZO,W/CONF, URINE: Benzodiazepines: NEGATIVE ng/mL (ref <100)

## 2024-03-17 LAB — HEMOGLOBIN A1C: Hgb A1c MFr Bld: 5.5 % (ref 4.6–6.5)

## 2024-03-17 LAB — DRUG MONITOR,BARBITURATE,W/CONF, URINE: Barbiturates: NEGATIVE ng/mL (ref <300)

## 2024-03-17 LAB — DRUG MONITOR, COCAINEMETAB, W/CONF, URINE: Cocaine Metabolite: NEGATIVE ng/mL (ref <150)

## 2024-03-24 ENCOUNTER — Other Ambulatory Visit: Payer: Self-pay | Admitting: Internal Medicine

## 2024-03-24 ENCOUNTER — Ambulatory Visit (INDEPENDENT_AMBULATORY_CARE_PROVIDER_SITE_OTHER)

## 2024-03-24 VITALS — Ht 63.0 in | Wt 123.0 lb

## 2024-03-24 DIAGNOSIS — Z0000 Encounter for general adult medical examination without abnormal findings: Secondary | ICD-10-CM

## 2024-03-24 DIAGNOSIS — Z0100 Encounter for examination of eyes and vision without abnormal findings: Secondary | ICD-10-CM

## 2024-03-24 NOTE — Patient Instructions (Addendum)
 [name] , Thank you for taking time to come for your Medicare Wellness Visit. I appreciate your ongoing commitment to your health goals. Please review the following plan we discussed and let me know if I can assist you in the future.   Referrals/Orders/Follow-Ups/Clinician Recommendations: Aim for 30 minutes of exercise or brisk walking, 6-8 glasses of water, and 5 servings of fruits and vegetables each day. Educated and advised to get Tdap vaccine at local pharmacy.  This is a list of the screening recommended for you and due dates:  Health Maintenance  Topic Date Due   DTaP/Tdap/Td vaccine (2 - Tdap) 04/20/2019   COVID-19 Vaccine (7 - 2024-25 season) 04/15/2024   Medicare Annual Wellness Visit  03/24/2025   Mammogram  06/25/2025   DEXA scan (bone density measurement)  06/25/2025   Colon Cancer Screening  03/03/2031   Pneumonia Vaccine  Completed   Flu Shot  Completed   Hepatitis C Screening  Completed   Zoster (Shingles) Vaccine  Completed   HPV Vaccine  Aged Out    Advanced directives: (Copy Requested) Please bring a copy of your health care power of attorney and living will to the office to be added to your chart at your convenience. You can mail to [facility] [address] [place], [place] [address] or email to [email or web address]  Next Medicare Annual Wellness Visit scheduled for next year: Yes   Managing Pain Without Opioids Opioids are strong medicines used to treat moderate to severe pain. For some people, especially those who have long-term (chronic) pain, opioids may not be the best choice for pain management due to: Side effects like nausea, constipation, and sleepiness. The risk of addiction (opioid use disorder). The longer you take opioids, the greater your risk of addiction. Pain that lasts for more than 3 months is called chronic pain. Managing chronic pain usually requires more than one approach and is often provided by a team of health care  providers working together (multidisciplinary approach). Pain management may be done at a pain management center or pain clinic. How to manage pain without the use of opioids Use non-opioid medicines Non-opioid medicines for pain may include: Over-the-counter or prescription non-steroidal anti-inflammatory drugs (NSAIDs). These may be the first medicines used for pain. They work well for muscle and bone pain, and they reduce swelling. Acetaminophen. This over-the-counter medicine may work well for milder pain but not swelling. Antidepressants. These may be used to treat chronic pain. A certain type of antidepressant (tricyclics) is often used. These medicines are given in lower doses for pain than when used for depression. Anticonvulsants. These are usually used to treat seizures but may also reduce nerve (neuropathic) pain. Muscle relaxants. These relieve pain caused by sudden muscle tightening (spasms). You may also use a pain medicine that is applied to the skin as a patch, cream, or gel (topical analgesic), such as a numbing medicine. These may cause fewer side effects than medicines taken by mouth. Do certain therapies as directed Some therapies can help with pain management. They include: Physical therapy. You will do exercises to gain strength and flexibility. A physical therapist may teach you exercises to move and stretch parts of your body that are weak, stiff, or painful. You can learn these exercises at physical therapy visits and practice them at home. Physical therapy may also involve: Massage. Heat wraps or applying heat or cold to affected areas. Electrical signals that interrupt pain signals (transcutaneous electrical nerve stimulation, TENS). Weak lasers  that reduce pain and swelling (low-level laser therapy). Signals from your body that help you learn to regulate pain (biofeedback). Occupational therapy. This helps you to learn ways to function at home and work with less  pain. Recreational therapy. This involves trying new activities or hobbies, such as a physical activity or drawing. Mental health therapy, including: Cognitive behavioral therapy (CBT). This helps you learn coping skills for dealing with pain. Acceptance and commitment therapy (ACT) to change the way you think and react to pain. Relaxation therapies, including muscle relaxation exercises and mindfulness-based stress reduction. Pain management counseling. This may be individual, family, or group counseling.  Receive medical treatments Medical treatments for pain management include: Nerve block injections. These may include a pain blocker and anti-inflammatory medicines. You may have injections: Near the spine to relieve chronic back or neck pain. Into joints to relieve back or joint pain. Into nerve areas that supply a painful area to relieve body pain. Into muscles (trigger point injections) to relieve some painful muscle conditions. A medical device placed near your spine to help block pain signals and relieve nerve pain or chronic back pain (spinal cord stimulation device). Acupuncture. Follow these instructions at home Medicines Take over-the-counter and prescription medicines only as told by your health care provider. If you are taking pain medicine, ask your health care providers about possible side effects to watch out for. Do not drive or use heavy machinery while taking prescription opioid pain medicine. Lifestyle  Do not use drugs or alcohol to reduce pain. If you drink alcohol, limit how much you have to: 0-1 drink a day for women who are not pregnant. 0-2 drinks a day for men. Know how much alcohol is in a drink. In the [place] one drink equals one 12 oz bottle of beer (355 mL), one 5 oz glass of wine (148 mL), or one 1 oz glass of hard liquor (44 mL). Do not use any products that contain nicotine or tobacco. These products include cigarettes, chewing tobacco, and vaping  devices, such as e-cigarettes. If you need help quitting, ask your health care provider. Eat a healthy diet and maintain a healthy weight. Poor diet and excess weight may make pain worse. Eat foods that are high in fiber. These include fresh fruits and vegetables, whole grains, and beans. Limit foods that are high in fat and processed sugars, such as fried and sweet foods. Exercise regularly. Exercise lowers stress and may help relieve pain. Ask your health care provider what activities and exercises are safe for you. If your health care provider approves, join an exercise class that combines movement and stress reduction. Examples include yoga and tai chi. Get enough sleep. Lack of sleep may make pain worse. Lower stress as much as possible. Practice stress reduction techniques as told by your therapist. General instructions Work with all your pain management providers to find the treatments that work best for you. You are an important member of your pain management team. There are many things you can do to reduce pain on your own. Consider joining an online or in-person support group for people who have chronic pain. Keep all follow-up visits. This is important. Where to find more information You can find more information about managing pain without opioids from: American Academy of Pain Medicine: painmed.org Institute for Chronic Pain: instituteforchronicpain.org American Chronic Pain Association: theacpa.org Contact a health care provider if: You have side effects from pain medicine. Your pain gets worse or does not get better with treatments  or home therapy. You are struggling with anxiety or depression. Summary Many types of pain can be managed without opioids. Chronic pain may respond better to pain management without opioids. Pain is best managed when you and a team of health care providers work together. Pain management without opioids may include non-opioid medicines, medical  treatments, physical therapy, mental health therapy, and lifestyle changes. Tell your health care providers if your pain gets worse or is not being managed well enough. This information is not intended to replace advice given to you by your health care provider. Make sure you discuss any questions you have with your health care provider. Document Revised: 03/23/2021 Document Reviewed: 03/23/2021 Elsevier Patient Education  2024 [employer].

## 2024-03-24 NOTE — Progress Notes (Signed)
 Subjective:   Chloe Young is a 73 y.o. who presents for a Medicare Wellness preventive visit.  Visit Complete: Virtual I connected with  [name] on 03/24/24 by a audio enabled telemedicine application and verified that I am speaking with the correct person using two identifiers.  Patient Location: Home  Provider Location: Office/Clinic  I discussed the limitations of evaluation and management by telemedicine. The patient expressed understanding and agreed to proceed.  Vital Signs: Because this visit was a virtual/telehealth visit, some criteria may be missing or patient reported. Any vitals not documented were not able to be obtained and vitals that have been documented are patient reported.  VideoDeclined- This patient declined Librarian, academic. Therefore the visit was completed with audio only.  Persons Participating in Visit: Patient.  AWV Questionnaire: No: Patient Medicare AWV questionnaire was not completed prior to this visit.  Cardiac Risk Factors include: advanced age (>87men, >37 women);hypertension;dyslipidemia     Objective:    Today's Vitals   03/24/24 1137  Weight: 123 lb (55.8 kg)  Height: 5\' 3"  (1.6 m)   Body mass index is 21.79 kg/m.     03/24/2024   11:33 AM 07/17/2022    3:52 PM 09/01/2021   12:00 PM 08/24/2021   11:16 AM 03/23/2021    6:01 AM 03/17/2021   10:38 AM 03/02/2021    3:00 PM  Advanced Directives  Does Patient Have a Medical Advance Directive? Yes No No No No No No  Type of Estate agent of [place];Living will        Copy of Healthcare Power of Attorney in Chart? No - copy requested        Would patient like information on creating a medical advance directive?  No - Patient declined No - Patient declined  No - Patient declined No - Patient declined Yes (MAU/Ambulatory/Procedural Areas - Information given)    Current Medications (verified) Outpatient Encounter Medications as of  03/24/2024  Medication Sig   acetaminophen (TYLENOL) 500 MG tablet Take 500-1,000 mg by mouth every 6 (six) hours as needed (for pain.).   amoxicillin (AMOXIL) 500 MG capsule Take 2,000 mg by mouth See admin instructions. Take 4 capsules (2000 mg) by mouth 1 hour prior to dental appointment   Calcium Carbonate-Vitamin D (CALCIUM + D PO) Take 1 tablet by mouth daily.   carboxymethylcellul-glycerin (LUBRICANT DROPS/DUAL-ACTION) 0.5-0.9 % ophthalmic solution Place 1 drop into both eyes 3 (three) times daily as needed for dry eyes.   cyclobenzaprine (FLEXERIL) 5 MG tablet Take 1-2 tablets (5-10 mg total) by mouth at bedtime as needed for muscle spasms.   fexofenadine (ALLEGRA) 180 MG tablet Take 180 mg by mouth in the morning.   fluticasone (FLONASE) 50 MCG/ACT nasal spray Place 2 sprays into both nostrils daily.   Lidocaine 4 % PTCH Place 1 patch onto the skin daily as needed (pain (neck)).   methocarbamol (ROBAXIN) 500 MG tablet Take 1 tablet by mouth 4 (four) times daily as needed.   metoprolol tartrate (LOPRESSOR) 25 MG tablet TAKE 1/2 TABLET BY MOUTH TWICE DAILY   Multiple Vitamin (MULITIVITAMIN WITH MINERALS) TABS Take 1 tablet by mouth in the morning.   omeprazole (PRILOSEC) 20 MG capsule TAKE 1 CAPSULE BY MOUTH DAILY   oxyCODONE-acetaminophen (PERCOCET) 7.5-325 MG tablet Take 1 tablet by mouth every 8 (eight) hours as needed (chronic neck pain).   pregabalin (LYRICA) 150 MG capsule TAKE ONE CAPSULE BY MOUTH TWICE DAILY   rosuvastatin (  CRESTOR) 10 MG tablet TAKE 1 TABLET BY MOUTH DAILY   topiramate (TOPAMAX) 100 MG tablet TAKE 1 TABLET BY MOUTH TWICE  DAILY   No facility-administered encounter medications on file as of 03/24/2024.    Allergies (verified) Patient has no known allergies.   History: Past Medical History:  Diagnosis Date   Cervical facet syndrome    Cervical post-laminectomy syndrome    Dr Hermelinda Medicus[facility]   Cervicalgia    Chronic pain syndrome    Closed fracture  of proximal end of right humerus 03/08/2021   Family history of adverse reaction to anesthesia    sister slow to wake up and nausea   GERD (gastroesophageal reflux disease)    Headache    due to neck fusion   Hyperlipidemia    joint pain with statin   Hypertension    Osteopenia 06/2016   T score -1.6 FRAX not calculated   Perforated abdominal viscus    Severe sepsis (HCC) 11/08/2020   Past Surgical History:  Procedure Laterality Date   CERVIAL FUSION  12/25/2004   Dr Lovell Sheehan, [place]   COLECTOMY WITH COLOSTOMY CREATION/HARTMANN PROCEDURE  11/08/2020   Procedure: SIGMOID COLECTOMY WITH COLOSTOMY CREATION/HARTMANN PROCEDURE;  Surgeon: Almond Lint, MD;  Location: [facility];  Service: General;;   COLONOSCOPY  12/25/2009    Dr Russella Dar   COLOSTOMY REVERSAL     02/2021   CYSTOSCOPY N/A 03/23/2021   Procedure: CYSTOSCOPY WITH BILATERAL FIREFLY INJECTION;  Surgeon: Heloise Purpura, MD;  Location: [facility];  Service: Urology;  Laterality: N/A;   ESI  09/03/2013   L 5- S1 ; Dr Herbie Baltimore HERNIA REPAIR N/A 09/01/2021   Procedure: LYSIS OF ADHESIONS, COMPONENT SEPARATION, BILATERAL TAR RELEASE, INCISIONAL HERNIA REPAIR WITH MESH, TAP BLOCK BILATERAL, PLACEMENT PREVENA WOUND VAC, PANNICULECTOMY;  Surgeon: Karie Soda, MD;  Location: [facility];  Service: General;  Laterality: N/A;  GEN AND LOCAL   INSERTION OF MESH N/A 09/01/2021   Procedure: INSERTION OF MESH;  Surgeon: Karie Soda, MD;  Location: [facility];  Service: General;  Laterality: N/A;   JOINT REPLACEMENT  07/26/2011    RIGHT KNEE ARTHROPLASTY   KNEE ARTHROSCOPY     bilaterally; Dr Shelle Iron   LAPAROTOMY N/A 11/08/2020   Procedure: EXPLORATORY LAPAROTOMY;  Surgeon: Almond Lint, MD;  Location: [facility];  Service: General;  Laterality: N/A;   TOTAL KNEE ARTHROPLASTY  01/11/2012   Procedure: TOTAL KNEE ARTHROPLASTY;  Surgeon: Javier Docker, MD;  Location: [facility];  Service: Orthopedics;  Laterality: Left;  General with Femoral nerve block   Family  History  Problem Relation Age of Onset   Diverticulitis Mother 47       with fissures.Marland Kitchenopted for no surgery   Cancer Father 40        malignant brain tumor   Breast cancer Maternal Aunt    Diabetes Maternal Grandfather    Heart failure Maternal Grandfather    Stroke Maternal Uncle        in 34s   Heart disease Maternal Uncle        X4 ; 1 had MI @ 50   Hypertension Sister    Diverticulitis Sister    Colon cancer Neg Hx    Colon polyps Neg Hx    Esophageal cancer Neg Hx    Rectal cancer Neg Hx    Stomach cancer Neg Hx    Social History   Socioeconomic History   Marital status: Married    Spouse name: Not  on file   Number of children: 1   Years of education: Not on file   Highest education level: Not on file  Occupational History   Occupation: HAIRDRESSER    Employer: [employer]  Tobacco Use   Smoking status: Never    Passive exposure: Never   Smokeless tobacco: Never  Vaping Use   Vaping status: Never Used  Substance and Sexual Activity   Alcohol use: Yes    Alcohol/week: 1.0 standard drink of alcohol    Types: 1 Glasses of wine per week    Comment: 1 glass of wine every night   Drug use: No   Sexual activity: Yes    Birth control/protection: Post-menopausal  Other Topics Concern   Not on file  Social History Narrative   Not on file   Social Drivers of Health   Financial Resource Strain: Low Risk  (03/24/2024)   Overall Financial Resource Strain (CARDIA)    Difficulty of Paying Living Expenses: Not hard at all  Food Insecurity: No Food Insecurity (03/24/2024)   Hunger Vital Sign    Worried About Running Out of Food in the Last Year: Never true    Ran Out of Food in the Last Year: Never true  Transportation Needs: No Transportation Needs (03/24/2024)   PRAPARE - Administrator, Civil Service (Medical): No    Lack of Transportation (Non-Medical): No  Physical Activity: Sufficiently Active (03/24/2024)   Exercise Vital Sign    Days of  Exercise per Week: 7 days    Minutes of Exercise per Session: 40 min  Stress: No Stress Concern Present (03/24/2024)   [employer] of Occupational Health - Occupational Stress Questionnaire    Feeling of Stress : Not at all  Social Connections: Socially Integrated (03/24/2024)   Social Connection and Isolation Panel [NHANES]    Frequency of Communication with Friends and Family: More than three times a week    Frequency of Social Gatherings with Friends and Family: More than three times a week    Attends Religious Services: More than 4 times per year    Active Member of [employer] or Organizations: Yes    Attends Engineer, structural: More than 4 times per year    Marital Status: Married    Tobacco Counseling Counseling given: No    Clinical Intake:  Pre-visit preparation completed: Yes        BMI - recorded: 21.79 Nutritional Risks: Unintentional weight loss Diabetes: No  Lab Results  Component Value Date   HGBA1C 5.5 03/14/2024   HGBA1C 5.4 08/24/2023   HGBA1C 5.4 02/21/2023     How often do you need to have someone help you when you read instructions, pamphlets, or other written materials from your doctor or pharmacy?: 1 - Never  Interpreter Needed?: No  Information entered by :: Hassell Halim, CMA   Activities of Daily Living     03/24/2024   11:40 AM  In your present state of health, do you have any difficulty performing the following activities:  Hearing? 0  Vision? 0  Difficulty concentrating or making decisions? 0  Walking or climbing stairs? 0  Dressing or bathing? 0  Doing errands, shopping? 0  Preparing Food and eating ? N  Using the Toilet? N  In the past six months, have you accidently leaked urine? N  Do you have problems with loss of bowel control? N  Managing your Medications? N  Managing your Finances? N  Housekeeping or managing  your Housekeeping? N    Patient Care Team: Pincus Sanes, MD as PCP - General (Internal  Medicine) Kathyrn Sheriff, [facility] (Inactive) as Pharmacist (Pharmacist) Meryl Dare, MD (Inactive) as Consulting Physician (Gastroenterology) Almond Lint, MD as Consulting Physician (General Surgery) Karie Soda, MD as Consulting Physician (Colon and Rectal Surgery) Kathleene Hazel, MD as Consulting Physician (Cardiology) [facility] [place] as Consulting Physician (Optometry)  Indicate any recent Medical Services you may have received from other than [facility] providers in the past year (date may be approximate).     Assessment:   This is a routine wellness examination for [place].  Hearing/Vision screen Hearing Screening - Comments:: Denies hearing difficulties   Vision Screening - Comments:: Wears rx glasses - will do a referral to a new Opthalmologist   Goals Addressed               This Visit's Progress     Patient Stated (pt-stated)        Patient stated she plans to stay active.        Depression Screen     03/24/2024   11:46 AM 08/28/2023    2:18 PM 08/24/2023    1:31 PM 07/30/2023   10:58 AM 05/29/2023    4:18 PM 04/10/2023    1:16 PM 03/06/2023    9:54 AM  PHQ 2/9 Scores  PHQ - 2 Score 0 2 2 2 2 2 2   PHQ- 9 Score 0 5 2 6 6 5 3     Fall Risk     03/24/2024   11:41 AM 08/24/2023    1:31 PM 02/21/2023    1:04 PM 10/17/2022    8:45 AM 08/21/2022    1:42 PM  Fall Risk   Falls in the past year? 0 0 0 0 0  Number falls in past yr: 0 0 0 0 0  Injury with Fall? 0 0 0 0 0  Risk for fall due to : No Fall Risks No Fall Risks No Fall Risks No Fall Risks No Fall Risks  Follow up Falls prevention discussed;Falls evaluation completed Falls evaluation completed Falls evaluation completed Falls evaluation completed Falls evaluation completed    MEDICARE RISK AT HOME:  Medicare Risk at Home Any stairs in or around the home?: Yes If so, are there any without handrails?: No Home free of loose throw rugs in walkways, pet beds, electrical cords,  etc?: Yes Adequate lighting in your home to reduce risk of falls?: Yes Life alert?: No Use of a cane, walker or w/c?: No Grab bars in the bathroom?: Yes Shower chair or bench in shower?: No Elevated toilet seat or a handicapped toilet?: Yes  TIMED UP AND GO:  Was the test performed?  No  Cognitive Function: 6CIT completed        03/24/2024   11:42 AM 07/17/2022    4:06 PM  6CIT Screen  What Year? 0 points 0 points  What month? 0 points 0 points  What time? 0 points 0 points  Count back from 20 0 points 0 points  Months in reverse 0 points 0 points  Repeat phrase 0 points 0 points  Total Score 0 points 0 points    Immunizations Immunization History  Administered Date(s) Administered   Fluad Quad(high Dose 65+) 09/25/2019, 09/08/2020, 10/25/2022, 10/16/2023   Influenza, High Dose Seasonal PF 10/08/2018   Influenza-Unspecified 11/03/2013, 09/29/2014   Moderna Covid-19 Fall Seasonal Vaccine 15yrs & older 10/16/2023   PFIZER(Purple Top)SARS-COV-2  Vaccination 02/14/2020, 03/09/2020, 10/23/2020   Pfizer Covid-19 Vaccine Bivalent Booster 35yrs & up 10/19/2021, 10/25/2022   Pfizer(Comirnaty)Fall Seasonal Vaccine 12 years and older 10/25/2022   Pneumococcal Conjugate-13 06/15/2016   Pneumococcal Polysaccharide-23 07/31/2017   Td 04/19/2009   Zoster Recombinant(Shingrix) 08/09/2018, 10/30/2018   Zoster, Live 06/04/2012    Screening Tests Health Maintenance  Topic Date Due   DTaP/Tdap/Td (2 - Tdap) 04/20/2019   COVID-19 Vaccine (7 - 2024-25 season) 04/15/2024   Medicare Annual Wellness (AWV)  03/24/2025   MAMMOGRAM  06/25/2025   DEXA SCAN  06/25/2025   Colonoscopy  03/03/2031   Pneumonia Vaccine 64+ Years old  Completed   INFLUENZA VACCINE  Completed   Hepatitis C Screening  Completed   Zoster Vaccines- Shingrix  Completed   HPV VACCINES  Aged Out    Health Maintenance  Health Maintenance Due  Topic Date Due   DTaP/Tdap/Td (2 - Tdap) 04/20/2019   Health  Maintenance Items Addressed: 03/24/2024 Referral sent to Optometry/Ophthalmology to Dr Mateo Flow.  Educated and advised to get Tdap vaccine at local pharmacy.  Additional Screening:  Vision Screening: Recommended annual ophthalmology exams for early detection of glaucoma and other disorders of the eye.  Dental Screening: Recommended annual dental exams for proper oral hygiene  Community Resource Referral / Chronic Care Management: CRR required this visit?  No   CCM required this visit?  No     Plan:     I have personally reviewed and noted the following in the patient's chart:   Medical and social history Use of alcohol, tobacco or illicit drugs  Current medications and supplements including opioid prescriptions. Patient is currently taking opioid prescriptions. Information provided to patient regarding non-opioid alternatives. Patient advised to discuss non-opioid treatment plan with their provider. Functional ability and status Nutritional status Physical activity Advanced directives List of other physicians Hospitalizations, surgeries, and ER visits in previous 12 months Vitals Screenings to include cognitive, depression, and falls Referrals and appointments  In addition, I have reviewed and discussed with patient certain preventive protocols, quality metrics, and best practice recommendations. A written personalized care plan for preventive services as well as general preventive health recommendations were provided to patient.     Darreld Mclean, CMA   03/24/2024   After Visit Summary: (MyChart) Due to this being a telephonic visit, the after visit summary with patients personalized plan was offered to patient via MyChart   Notes:  Educated and advised to get Tdap vaccine at local pharmacy.

## 2024-04-18 ENCOUNTER — Other Ambulatory Visit: Payer: Self-pay | Admitting: Internal Medicine

## 2024-04-30 ENCOUNTER — Other Ambulatory Visit: Payer: Self-pay | Admitting: Internal Medicine

## 2024-05-30 ENCOUNTER — Other Ambulatory Visit: Payer: Self-pay | Admitting: Internal Medicine

## 2024-07-23 ENCOUNTER — Other Ambulatory Visit: Payer: Self-pay | Admitting: Internal Medicine

## 2024-07-30 DIAGNOSIS — Z1231 Encounter for screening mammogram for malignant neoplasm of breast: Secondary | ICD-10-CM | POA: Diagnosis not present

## 2024-07-30 LAB — HM MAMMOGRAPHY

## 2024-08-01 ENCOUNTER — Encounter: Payer: Self-pay | Admitting: Internal Medicine

## 2024-08-26 DIAGNOSIS — H25813 Combined forms of age-related cataract, bilateral: Secondary | ICD-10-CM | POA: Diagnosis not present

## 2024-08-26 DIAGNOSIS — H524 Presbyopia: Secondary | ICD-10-CM | POA: Diagnosis not present

## 2024-08-26 DIAGNOSIS — R7309 Other abnormal glucose: Secondary | ICD-10-CM | POA: Diagnosis not present

## 2024-08-26 DIAGNOSIS — H04123 Dry eye syndrome of bilateral lacrimal glands: Secondary | ICD-10-CM | POA: Diagnosis not present

## 2024-08-26 DIAGNOSIS — Z83518 Family history of other specified eye disorder: Secondary | ICD-10-CM | POA: Diagnosis not present

## 2024-09-03 ENCOUNTER — Telehealth: Payer: Self-pay

## 2024-09-03 NOTE — Telephone Encounter (Signed)
 Copied from CRM [id number]. Topic: General - Other >> Sep 03, 2024 11:17 AM Frederich PARAS wrote: Reason for CRM: pt calling to get a covid vaccine prescription ,  pt callback # is [phone]

## 2024-09-14 ENCOUNTER — Encounter: Payer: Self-pay | Admitting: Internal Medicine

## 2024-09-14 NOTE — Progress Notes (Unsigned)
 Subjective:    Patient ID: Chloe Young, female    DOB: Feb 16, 1951, 73 y.o.   MRN: [id number]     HPI Chrisma is here for follow up of her chronic medical problems.  Last week had a flare of her neck pain.  The pain was not well-controlled during that time, but she thinks she knows what flared it-she was very busy doing things around the house.  The pain has improved.  She does feel that overall the pain medication is controlled with her current pain regimen.  Medications and allergies reviewed with patient and updated if appropriate.  Current Outpatient Medications on File Prior to Visit  Medication Sig Dispense Refill   acetaminophen  (TYLENOL ) 500 MG tablet Take 500-1,000 mg by mouth every 6 (six) hours as needed (for pain.).     amoxicillin  (AMOXIL ) 500 MG capsule Take 2,000 mg by mouth See admin instructions. Take 4 capsules (2000 mg) by mouth 1 hour prior to dental appointment     Calcium  Carbonate-Vitamin D  (CALCIUM  + D PO) Take 1 tablet by mouth daily.     carboxymethylcellul-glycerin  (LUBRICANT DROPS/DUAL-ACTION) 0.5-0.9 % ophthalmic solution Place 1 drop into both eyes 3 (three) times daily as needed for dry eyes.     cyclobenzaprine  (FLEXERIL ) 5 MG tablet Take 1-2 tablets (5-10 mg total) by mouth at bedtime as needed for muscle spasms. 30 tablet 2   fexofenadine (ALLEGRA) 180 MG tablet Take 180 mg by mouth in the morning.     fluticasone  (FLONASE ) 50 MCG/ACT nasal spray Place 2 sprays into both nostrils daily. 16 g 6   Lidocaine  4 % PTCH Place 1 patch onto the skin daily as needed (pain (neck)).     methocarbamol  (ROBAXIN ) 500 MG tablet Take 1 tablet by mouth 4 (four) times daily as needed.     metoprolol  tartrate (LOPRESSOR ) 25 MG tablet TAKE 1/2 TABLET BY MOUTH TWICE DAILY 180 tablet 3   Multiple Vitamin (MULITIVITAMIN WITH MINERALS) TABS Take 1 tablet by mouth in the morning.     omeprazole  (PRILOSEC) 20 MG capsule TAKE 1 CAPSULE BY MOUTH DAILY 100 capsule 2    oxyCODONE -acetaminophen  (PERCOCET) 7.5-325 MG tablet Take 1 tablet by mouth every 8 (eight) hours as needed (chronic neck pain). 60 tablet 0   pregabalin  (LYRICA ) 150 MG capsule TAKE ONE CAPSULE BY MOUTH TWICE DAILY 180 capsule 1   rosuvastatin  (CRESTOR ) 10 MG tablet TAKE 1 TABLET BY MOUTH DAILY 100 tablet 2   topiramate  (TOPAMAX ) 100 MG tablet TAKE 1 TABLET BY MOUTH TWICE  DAILY 200 tablet 2   No current facility-administered medications on file prior to visit.     Review of Systems  Constitutional:  Negative for fever.  Respiratory:  Negative for cough, shortness of breath and wheezing.   Cardiovascular:  Positive for leg swelling. Negative for chest pain and palpitations.  Neurological:  Positive for headaches (cervicogenic). Negative for light-headedness.       Objective:   Vitals:   09/15/24 1324  BP: 108/68  Pulse: 89  Temp: 98.8 F (37.1 C)  SpO2: 99%   BP Readings from Last 3 Encounters:  09/15/24 108/68  03/14/24 108/68  11/08/23 125/75   Wt Readings from Last 3 Encounters:  09/15/24 126 lb (57.2 kg)  03/24/24 123 lb (55.8 kg)  03/14/24 123 lb 6.4 oz (56 kg)   Body mass index is 22.32 kg/m.    Physical Exam Constitutional:      General: She is not  in acute distress.    Appearance: Normal appearance.  HENT:     Head: Normocephalic and atraumatic.  Eyes:     Conjunctiva/sclera: Conjunctivae normal.  Cardiovascular:     Rate and Rhythm: Normal rate and regular rhythm.     Heart sounds: Normal heart sounds.  Pulmonary:     Effort: Pulmonary effort is normal. No respiratory distress.     Breath sounds: Normal breath sounds. No wheezing.  Musculoskeletal:     Cervical back: Neck supple.     Right lower leg: No edema.     Left lower leg: No edema.  Lymphadenopathy:     Cervical: No cervical adenopathy.  Skin:    General: Skin is warm and dry.     Findings: No rash.  Neurological:     Mental Status: She is alert. Mental status is at baseline.   Psychiatric:        Mood and Affect: Mood normal.        Behavior: Behavior normal.        Lab Results  Component Value Date   WBC 4.4 08/24/2023   HGB 12.3 08/24/2023   HCT 37.5 08/24/2023   PLT 210.0 08/24/2023   GLUCOSE 93 03/14/2024   CHOL 172 03/14/2024   TRIG 102.0 03/14/2024   HDL 77.80 03/14/2024   LDLDIRECT 156.9 11/25/2013   LDLCALC 74 03/14/2024   ALT 22 03/14/2024   AST 27 03/14/2024   NA 139 03/14/2024   K 3.8 03/14/2024   CL 105 03/14/2024   CREATININE 0.61 03/14/2024   BUN 11 03/14/2024   CO2 28 03/14/2024   TSH 1.18 08/24/2023   INR 1.3 (H) 11/08/2020   HGBA1C 5.5 03/14/2024     Assessment & Plan:    See Problem List for Assessment and Plan of chronic medical problems.

## 2024-09-14 NOTE — Patient Instructions (Addendum)
      Blood work was ordered.       Medications changes include :   None    A referral was ordered and someone will call you to schedule an appointment.     Return in about 6 months (around 03/15/2025) for Physical Exam.

## 2024-09-15 ENCOUNTER — Ambulatory Visit: Admitting: Internal Medicine

## 2024-09-15 ENCOUNTER — Telehealth: Payer: Self-pay

## 2024-09-15 VITALS — BP 108/68 | HR 89 | Temp 98.8°F | Ht 63.0 in | Wt 126.0 lb

## 2024-09-15 DIAGNOSIS — R7303 Prediabetes: Secondary | ICD-10-CM | POA: Diagnosis not present

## 2024-09-15 DIAGNOSIS — M810 Age-related osteoporosis without current pathological fracture: Secondary | ICD-10-CM | POA: Diagnosis not present

## 2024-09-15 DIAGNOSIS — K219 Gastro-esophageal reflux disease without esophagitis: Secondary | ICD-10-CM

## 2024-09-15 DIAGNOSIS — G8929 Other chronic pain: Secondary | ICD-10-CM

## 2024-09-15 DIAGNOSIS — I10 Essential (primary) hypertension: Secondary | ICD-10-CM

## 2024-09-15 DIAGNOSIS — E782 Mixed hyperlipidemia: Secondary | ICD-10-CM | POA: Diagnosis not present

## 2024-09-15 DIAGNOSIS — G4486 Cervicogenic headache: Secondary | ICD-10-CM | POA: Diagnosis not present

## 2024-09-15 LAB — CBC WITH DIFFERENTIAL/PLATELET
Basophils Absolute: 0 K/uL (ref 0.0–0.1)
Basophils Relative: 0.3 % (ref 0.0–3.0)
Eosinophils Absolute: 0.1 K/uL (ref 0.0–0.7)
Eosinophils Relative: 2 % (ref 0.0–5.0)
HCT: 38.5 % (ref 36.0–46.0)
Hemoglobin: 12.9 g/dL (ref 12.0–15.0)
Lymphocytes Relative: 34.5 % (ref 12.0–46.0)
Lymphs Abs: 1.5 K/uL (ref 0.7–4.0)
MCHC: 33.4 g/dL (ref 30.0–36.0)
MCV: 90.8 fl (ref 78.0–100.0)
Monocytes Absolute: 0.4 K/uL (ref 0.1–1.0)
Monocytes Relative: 9 % (ref 3.0–12.0)
Neutro Abs: 2.4 K/uL (ref 1.4–7.7)
Neutrophils Relative %: 54.2 % (ref 43.0–77.0)
Platelets: 192 K/uL (ref 150.0–400.0)
RBC: 4.24 Mil/uL (ref 3.87–5.11)
RDW: 13.8 % (ref 11.5–15.5)
WBC: 4.5 K/uL (ref 4.0–10.5)

## 2024-09-15 LAB — COMPREHENSIVE METABOLIC PANEL WITH GFR
ALT: 36 U/L — ABNORMAL HIGH (ref 0–35)
AST: 33 U/L (ref 0–37)
Albumin: 4.5 g/dL (ref 3.5–5.2)
Alkaline Phosphatase: 47 U/L (ref 39–117)
BUN: 11 mg/dL (ref 6–23)
CO2: 25 meq/L (ref 19–32)
Calcium: 9.7 mg/dL (ref 8.4–10.5)
Chloride: 105 meq/L (ref 96–112)
Creatinine, Ser: 0.62 mg/dL (ref 0.40–1.20)
GFR: 87.77 mL/min (ref >60.00)
Glucose, Bld: 97 mg/dL (ref 70–99)
Potassium: 4 meq/L (ref 3.5–5.1)
Sodium: 137 meq/L (ref 135–145)
Total Bilirubin: 0.4 mg/dL (ref 0.2–1.2)
Total Protein: 6.9 g/dL (ref 6.0–8.3)

## 2024-09-15 LAB — LIPID PANEL
Cholesterol: 166 mg/dL (ref 0–200)
HDL: 72.1 mg/dL (ref >39.00)
LDL Cholesterol: 77 mg/dL (ref 0–99)
NonHDL: 93.48
Total CHOL/HDL Ratio: 2
Triglycerides: 81 mg/dL (ref 0.0–149.0)
VLDL: 16.2 mg/dL (ref 0.0–40.0)

## 2024-09-15 LAB — HEMOGLOBIN A1C: Hgb A1c MFr Bld: 5.9 % (ref 4.6–6.5)

## 2024-09-15 LAB — VITAMIN D 25 HYDROXY (VIT D DEFICIENCY, FRACTURES): VITD: 31.47 ng/mL (ref 30.00–100.00)

## 2024-09-15 MED ORDER — METOPROLOL TARTRATE 25 MG PO TABS: 12.5000 mg | ORAL_TABLET | Freq: Two times a day (BID) | ORAL | 3 refills | Status: AC

## 2024-09-15 MED ORDER — PREGABALIN 150 MG PO CAPS: 150.0000 mg | ORAL_CAPSULE | Freq: Two times a day (BID) | ORAL | 1 refills | Status: AC

## 2024-09-15 MED ORDER — AZELASTINE-FLUTICASONE 137-50 MCG/ACT NA SUSP: 1.0000 | Freq: Every day | NASAL | 2 refills | Status: AC

## 2024-09-15 MED ORDER — OXYCODONE-ACETAMINOPHEN 7.5-325 MG PO TABS: 1.0000 | ORAL_TABLET | Freq: Three times a day (TID) | ORAL | 0 refills | Status: DC | PRN

## 2024-09-15 NOTE — Assessment & Plan Note (Signed)
 Chronic Pain fairly controlled Pain varies day to day - had bad flare last week Continue Percocet 7.5-325 mg 1 tab every 8 hours as needed Continue Lyrica  150 mg twice daily Continue Topamax  100 mg twice daily Continue Flexeril  5 mg bedtime as needed Continue ice, heat, lidocaine  patches

## 2024-09-15 NOTE — Assessment & Plan Note (Signed)
Chronic Regular exercise and healthy diet encouraged Check lipid panel, cmp Continue Crestor 10 mg daily

## 2024-09-15 NOTE — Assessment & Plan Note (Signed)
 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

## 2024-09-15 NOTE — Assessment & Plan Note (Signed)
Chronic GERD controlled Continue omeprazole 20 mg daily  

## 2024-09-15 NOTE — Assessment & Plan Note (Signed)
 Chronic Blood pressure well controlled-on low side but asymptomatic Advised monitoring BP at home CMP Continue metoprolol  12.5 mg twice daily

## 2024-09-15 NOTE — Assessment & Plan Note (Signed)
 Chronic Lab Results  Component Value Date   HGBA1C 5.5 03/14/2024   Check a1c Low sugar / carb diet Stressed regular exercise

## 2024-09-15 NOTE — Assessment & Plan Note (Signed)
 Chronic Last dexa 06/2023 - compared to dexa from 03/2020 bone density is worse on fosamax  since 09/2019 - 2024 She is walking She is taking calcium  and vitamin d  daily Has failed fosamax  Prolia was not covered Reclast  No. 1 11/08/2023-second infusion due this November-Will arrange Plan for Reclast  x 3 years Check vitamin D  level

## 2024-09-17 ENCOUNTER — Ambulatory Visit: Payer: Self-pay | Admitting: Internal Medicine

## 2024-10-03 ENCOUNTER — Other Ambulatory Visit: Payer: Self-pay | Admitting: Internal Medicine

## 2024-10-07 ENCOUNTER — Other Ambulatory Visit (HOSPITAL_BASED_OUTPATIENT_CLINIC_OR_DEPARTMENT_OTHER): Payer: Self-pay

## 2024-10-07 MED ORDER — FLUZONE HIGH-DOSE 0.5 ML IM SUSY
0.5000 mL | PREFILLED_SYRINGE | Freq: Once | INTRAMUSCULAR | 0 refills | Status: AC
  Filled 2024-10-07: qty 0.5, 1d supply, fill #0

## 2024-10-15 ENCOUNTER — Encounter: Payer: Self-pay | Admitting: Internal Medicine

## 2024-10-22 ENCOUNTER — Other Ambulatory Visit: Payer: Self-pay | Admitting: Internal Medicine

## 2024-10-22 ENCOUNTER — Telehealth: Payer: Self-pay

## 2024-10-22 NOTE — Telephone Encounter (Signed)
 Copied from CRM [id number]. Topic: General - Call Back - No Documentation >> Oct 22, 2024  9:49 AM Alfonso ORN wrote: Reason for CRM: pt called back regarding conversation with Tobias to schedule infusion and calcium  level check. Please contact pt to schedule reclast  infusion and calcium  levels as needed.

## 2024-10-24 ENCOUNTER — Other Ambulatory Visit: Payer: Self-pay

## 2024-10-24 DIAGNOSIS — E782 Mixed hyperlipidemia: Secondary | ICD-10-CM

## 2024-10-24 DIAGNOSIS — I10 Essential (primary) hypertension: Secondary | ICD-10-CM

## 2024-10-24 DIAGNOSIS — M810 Age-related osteoporosis without current pathological fracture: Secondary | ICD-10-CM

## 2024-10-24 NOTE — Telephone Encounter (Signed)
 Lab order placed and my-chart sent back to patient.

## 2024-10-24 NOTE — Telephone Encounter (Unsigned)
 Copied from CRM [id number]. Topic: Clinical - Request for Lab/Test Order >> Oct 24, 2024  9:11 AM Rea BROCKS wrote: Reason for CRM: Patient called in returning call to [facility]. Patient stated that she was told she can come in for a calcium  check but wasn't sure if she was to receive a call back to make sure that she can come in. There's no order in chart for a calcium  check.    [phone] (M)

## 2024-10-28 ENCOUNTER — Other Ambulatory Visit (INDEPENDENT_AMBULATORY_CARE_PROVIDER_SITE_OTHER)

## 2024-10-28 DIAGNOSIS — E782 Mixed hyperlipidemia: Secondary | ICD-10-CM | POA: Diagnosis not present

## 2024-10-28 DIAGNOSIS — M810 Age-related osteoporosis without current pathological fracture: Secondary | ICD-10-CM

## 2024-10-28 DIAGNOSIS — I10 Essential (primary) hypertension: Secondary | ICD-10-CM | POA: Diagnosis not present

## 2024-10-28 LAB — COMPREHENSIVE METABOLIC PANEL WITH GFR
ALT: 21 U/L (ref 0–35)
AST: 23 U/L (ref 0–37)
Albumin: 4.5 g/dL (ref 3.5–5.2)
Alkaline Phosphatase: 51 U/L (ref 39–117)
BUN: 8 mg/dL (ref 6–23)
CO2: 27 meq/L (ref 19–32)
Calcium: 9.1 mg/dL (ref 8.4–10.5)
Chloride: 106 meq/L (ref 96–112)
Creatinine, Ser: 0.67 mg/dL (ref 0.40–1.20)
GFR: 86.08 mL/min (ref >60.00)
Glucose, Bld: 111 mg/dL — ABNORMAL HIGH (ref 70–99)
Potassium: 3.8 meq/L (ref 3.5–5.1)
Sodium: 140 meq/L (ref 135–145)
Total Bilirubin: 0.4 mg/dL (ref 0.2–1.2)
Total Protein: 7 g/dL (ref 6.0–8.3)

## 2024-10-28 NOTE — Telephone Encounter (Signed)
 Awaiting on patient's current lab so I can get her set up.

## 2024-10-29 ENCOUNTER — Telehealth (HOSPITAL_COMMUNITY): Payer: Self-pay

## 2024-10-29 ENCOUNTER — Ambulatory Visit: Payer: Self-pay | Admitting: Internal Medicine

## 2024-10-29 ENCOUNTER — Other Ambulatory Visit (HOSPITAL_COMMUNITY): Payer: Self-pay | Admitting: Internal Medicine

## 2024-10-29 NOTE — Telephone Encounter (Signed)
 Labs back and order to be faxed today for Reclast .

## 2024-10-29 NOTE — Telephone Encounter (Signed)
 Auth Submission: APPROVED Site of care: Site of care: [facility] Payer: [employer] Medicare Medication & CPT/J Code(s) submitted: Reclast  (Zolendronic acid) [id number] Diagnosis Code: M81.0 Route of submission (phone, fax, portal): portal Phone # Fax # Auth type: Buy/Bill HB Units/visits requested: 5mg  x 1 dose Reference number: [id number] Approval from: 10/29/24 to 10/29/25

## 2024-10-30 ENCOUNTER — Other Ambulatory Visit: Payer: Self-pay | Admitting: Internal Medicine

## 2024-11-09 ENCOUNTER — Encounter: Payer: Self-pay | Admitting: Internal Medicine

## 2024-11-11 ENCOUNTER — Encounter (HOSPITAL_COMMUNITY)

## 2024-11-18 ENCOUNTER — Ambulatory Visit (HOSPITAL_COMMUNITY)
Admission: RE | Admit: 2024-11-18 | Discharge: 2024-11-18 | Disposition: A | Discharge status: Home / Self Care | Source: Ambulatory Visit | Attending: Internal Medicine | Admitting: Internal Medicine

## 2024-11-18 VITALS — BP 138/82 | HR 54 | Temp 98.5°F | Resp 17

## 2024-11-18 DIAGNOSIS — M810 Age-related osteoporosis without current pathological fracture: Secondary | ICD-10-CM | POA: Insufficient documentation

## 2024-11-18 MED ORDER — ACETAMINOPHEN 325 MG PO TABS
650.0000 mg | ORAL_TABLET | Freq: Once | ORAL | Status: AC
  Administered 2024-11-18: 650 mg via ORAL

## 2024-11-18 MED ORDER — SODIUM CHLORIDE 0.9 % IV SOLN: INTRAVENOUS | Status: DC

## 2024-11-18 MED ORDER — DIPHENHYDRAMINE HCL 25 MG PO CAPS: 25.0000 mg | ORAL_CAPSULE | Freq: Once | ORAL | Status: DC

## 2024-11-18 MED ORDER — ACETAMINOPHEN 325 MG PO TABS
ORAL_TABLET | ORAL | Status: AC
Start: 2024-11-18 — End: 2024-11-18
  Filled 2024-11-18: qty 2

## 2024-11-18 MED ORDER — ZOLEDRONIC ACID 5 MG/100ML IV SOLN
INTRAVENOUS | Status: AC
  Filled 2024-11-18: qty 100

## 2024-11-18 MED ORDER — ZOLEDRONIC ACID 5 MG/100ML IV SOLN
5.0000 mg | Freq: Once | INTRAVENOUS | Status: AC
  Administered 2024-11-18: 5 mg via INTRAVENOUS

## 2024-12-02 ENCOUNTER — Encounter (HOSPITAL_COMMUNITY): Payer: Self-pay | Admitting: Surgery

## 2024-12-09 ENCOUNTER — Other Ambulatory Visit: Payer: Self-pay | Admitting: Internal Medicine

## 2025-01-25 ENCOUNTER — Other Ambulatory Visit: Payer: Self-pay | Admitting: Internal Medicine

## 2025-03-19 ENCOUNTER — Ambulatory Visit: Admitting: Internal Medicine

## 2025-03-30 ENCOUNTER — Ambulatory Visit

## 2025-04-17 ENCOUNTER — Ambulatory Visit

## 2025-06-23 ENCOUNTER — Ambulatory Visit
